# Patient Record
Sex: Female | Born: 1975 | Race: Black or African American | Hispanic: No | Marital: Single | Smoking: Never smoker
Health system: Southern US, Community
[De-identification: ages and names within clinical notes are randomized; demographics above are authoritative.]

## PROBLEM LIST (undated history)

## (undated) DIAGNOSIS — O3410 Maternal care for benign tumor of corpus uteri, unspecified trimester: Secondary | ICD-10-CM

## (undated) DIAGNOSIS — D259 Leiomyoma of uterus, unspecified: Secondary | ICD-10-CM

## (undated) DIAGNOSIS — I10 Essential (primary) hypertension: Secondary | ICD-10-CM

## (undated) DIAGNOSIS — T7840XA Allergy, unspecified, initial encounter: Secondary | ICD-10-CM

## (undated) DIAGNOSIS — M1990 Unspecified osteoarthritis, unspecified site: Secondary | ICD-10-CM

## (undated) DIAGNOSIS — K429 Umbilical hernia without obstruction or gangrene: Secondary | ICD-10-CM

## (undated) DIAGNOSIS — E785 Hyperlipidemia, unspecified: Secondary | ICD-10-CM

## (undated) HISTORY — DX: Allergy, unspecified, initial encounter: T78.40XA

## (undated) HISTORY — PX: UMBILICAL HERNIA REPAIR: SHX196

## (undated) HISTORY — DX: Morbid (severe) obesity due to excess calories: E66.01

## (undated) HISTORY — DX: Leiomyoma of uterus, unspecified: D25.9

## (undated) HISTORY — DX: Umbilical hernia without obstruction or gangrene: K42.9

## (undated) HISTORY — PX: HERNIA REPAIR: SHX51

## (undated) HISTORY — DX: Unspecified osteoarthritis, unspecified site: M19.90

## (undated) HISTORY — PX: WISDOM TOOTH EXTRACTION: SHX21

## (undated) HISTORY — DX: Hyperlipidemia, unspecified: E78.5

## (undated) HISTORY — DX: Leiomyoma of uterus, unspecified: O34.10

## (undated) HISTORY — DX: Essential (primary) hypertension: I10

---

## 2000-06-10 DIAGNOSIS — R87612 Low grade squamous intraepithelial lesion on cytologic smear of cervix (LGSIL): Secondary | ICD-10-CM | POA: Insufficient documentation

## 2000-06-10 HISTORY — DX: Low grade squamous intraepithelial lesion on cytologic smear of cervix (LGSIL): R87.612

## 2000-08-11 ENCOUNTER — Other Ambulatory Visit: Admission: RE | Admit: 2000-08-11 | Discharge: 2000-08-11 | Payer: Self-pay | Admitting: Obstetrics and Gynecology

## 2002-05-13 DIAGNOSIS — R87619 Unspecified abnormal cytological findings in specimens from cervix uteri: Secondary | ICD-10-CM | POA: Insufficient documentation

## 2002-06-07 ENCOUNTER — Other Ambulatory Visit: Admission: RE | Admit: 2002-06-07 | Discharge: 2002-06-07 | Payer: Self-pay | Admitting: Obstetrics and Gynecology

## 2002-12-03 ENCOUNTER — Other Ambulatory Visit: Admission: RE | Admit: 2002-12-03 | Discharge: 2002-12-03 | Payer: Self-pay | Admitting: Obstetrics and Gynecology

## 2004-11-26 ENCOUNTER — Other Ambulatory Visit: Admission: RE | Admit: 2004-11-26 | Discharge: 2004-11-26 | Payer: Self-pay | Admitting: Obstetrics and Gynecology

## 2012-02-27 ENCOUNTER — Ambulatory Visit (INDEPENDENT_AMBULATORY_CARE_PROVIDER_SITE_OTHER): Payer: BC Managed Care – PPO | Admitting: Family Medicine

## 2012-02-27 ENCOUNTER — Ambulatory Visit: Payer: BC Managed Care – PPO

## 2012-02-27 VITALS — BP 135/107 | HR 94 | Temp 98.0°F | Resp 18 | Ht 65.5 in | Wt 322.2 lb

## 2012-02-27 DIAGNOSIS — I10 Essential (primary) hypertension: Secondary | ICD-10-CM

## 2012-02-27 DIAGNOSIS — S76019A Strain of muscle, fascia and tendon of unspecified hip, initial encounter: Secondary | ICD-10-CM

## 2012-02-27 DIAGNOSIS — IMO0002 Reserved for concepts with insufficient information to code with codable children: Secondary | ICD-10-CM

## 2012-02-27 DIAGNOSIS — M25551 Pain in right hip: Secondary | ICD-10-CM

## 2012-02-27 DIAGNOSIS — M25559 Pain in unspecified hip: Secondary | ICD-10-CM

## 2012-02-27 MED ORDER — CYCLOBENZAPRINE HCL 5 MG PO TABS: 5.0000 mg | ORAL_TABLET | Freq: Three times a day (TID) | ORAL | Status: DC | PRN

## 2012-02-27 MED ORDER — HYDROCODONE-ACETAMINOPHEN 5-500 MG PO TABS: 1.0000 | ORAL_TABLET | Freq: Four times a day (QID) | ORAL | Status: DC | PRN

## 2012-02-27 NOTE — Patient Instructions (Addendum)
1. Pain in right hip  DG Hip Complete Right, DG Femur Right  2. Strain of hip flexor  cyclobenzaprine (FLEXERIL) 5 MG tablet, HYDROcodone-acetaminophen (VICODIN) 5-500 MG per tablet

## 2012-02-27 NOTE — Progress Notes (Signed)
[address]   [place], [place]  [address]   [phone]  Subjective:    Patient ID: Amanda Paul, female    DOB: 02-11-1976, 36 y.o.   MRN: [id number]  HPIThis 36 y.o. female presents for evaluation of RLE thigh pain.  At work and felt like pulled a muscle R upper thigh.  Extremely tight in R upper thigh. Pain with walking.  Radiates into leg.  Feels a bit swollen; all the pain in R upper lateral tight.  Minimal activity today.  Taking Ibuprofen 800mg  this morning.  No more medication today.  Trying to rest without improvement.  No injury or overuse at work; works at [employer]; no unusual activity.  The following day, barely able to get out of bed. Walking causes severe pain.  Having to shift weight to L side of body.  No associated back pain.  Had gone to [facility] but recommended evaluation to Urgent Care.  +muscle spasms in lower leg R.  No rash on leg.  Severity 7/10  2.  HTN:  Diagnosed by PCP recently; now checking BP with thigh cuff and running normal; not checking at home.     Review of Systems  Constitutional: Negative for fever, chills, diaphoresis and fatigue.  Respiratory: Negative for chest tightness and shortness of breath.   Cardiovascular: Negative for chest pain, palpitations and leg swelling.  Genitourinary: Negative for decreased urine volume and difficulty urinating.  Musculoskeletal: Positive for myalgias and gait problem. Negative for back pain and arthralgias.  Neurological: Negative for weakness and numbness.       Objective:   Physical Exam  Nursing note and vitals reviewed. Constitutional: She is oriented to person, place, and time. She appears well-developed and well-nourished.  HENT:  Head: Normocephalic and atraumatic.  Eyes: Conjunctivae normal and EOM are normal. Pupils are equal, round, and reactive to light.  Neck: Normal range of motion. Neck supple. No JVD present. No thyromegaly present.  Cardiovascular: Normal rate, regular rhythm and  normal heart sounds.   No murmur heard.      HOMMEN'S NEGATIVE BLE.  Pulmonary/Chest: Effort normal and breath sounds normal.  Musculoskeletal:       Right hip: She exhibits normal range of motion, normal strength, no tenderness, no bony tenderness and no swelling.       Lumbar back: Normal. She exhibits normal range of motion, no tenderness, no bony tenderness, no pain and no spasm.       Right upper leg: She exhibits tenderness. She exhibits no bony tenderness, no swelling, no edema, no deformity and no laceration.       LUMBAR SPINE: FULL ROM; NON-TENDER MIDLINE; NO PARASPINAL TTP; STRAIGHT LEG RAISES NEGATIVE. R HIP: NON-TENDER; +PAIN WITH ROM.  MOTOR 5/5.  PAIN WITH FLEXION OF R THIGH.  INTERNAL AND EXTERNAL ROTATION OF HIP WITH PAIN.  Lymphadenopathy:    She has no cervical adenopathy.  Neurological: She is alert and oriented to person, place, and time.  Skin: Skin is warm and dry. No rash noted. No erythema.  Psychiatric: She has a normal mood and affect. Her behavior is normal.      [facility] reading (PRIMARY) by  Dr. Katrinka Blazing.  R hip: NAD; R femur:  NAD   Assessment & Plan:   1. Pain in right hip  DG Hip Complete Right, DG Femur Right  2. Strain of hip flexor  cyclobenzaprine (FLEXERIL) 5 MG tablet, HYDROcodone-acetaminophen (VICODIN) 5-500 MG per tablet    1.  Pain/Strain R  hip flexor:  New.  Rx for Flexeril, hydrocodone. Recommend Ibuprofen 800mg  tid scheduled.  Frequent ambulation; heat to area.  Home exercise program provided to perform daily. Avoid repetitive squatting, bending, twisting, lifting, prolonged ambulation.  If no improvement in 1-2 weeks, call for ortho referral and PT.  Meds ordered this encounter  Medications  . hydrochlorothiazide (MICROZIDE) 12.5 MG capsule    Sig: Take 12.5 mg by mouth 2 (two) times daily.  Marland Kitchen amLODipine (NORVASC) 5 MG tablet    Sig: Take 5 mg by mouth daily.  . NON FORMULARY    Sig: Hair skin and nail vitamin  . cyclobenzaprine (FLEXERIL) 5  MG tablet    Sig: Take 1 tablet (5 mg total) by mouth 3 (three) times daily as needed for muscle spasms.    Dispense:  30 tablet    Refill:  0  . HYDROcodone-acetaminophen (VICODIN) 5-500 MG per tablet    Sig: Take 1 tablet by mouth every 6 (six) hours as needed for pain.    Dispense:  20 tablet    Refill:  0

## 2012-04-11 ENCOUNTER — Encounter (HOSPITAL_COMMUNITY): Payer: Self-pay | Admitting: *Deleted

## 2012-04-11 ENCOUNTER — Emergency Department (HOSPITAL_COMMUNITY)
Admission: EM | Admit: 2012-04-11 | Discharge: 2012-04-12 | Disposition: A | Discharge status: Home / Self Care | Payer: BC Managed Care – PPO | Attending: Emergency Medicine | Admitting: Emergency Medicine

## 2012-04-11 ENCOUNTER — Ambulatory Visit (INDEPENDENT_AMBULATORY_CARE_PROVIDER_SITE_OTHER): Payer: BC Managed Care – PPO | Admitting: Family Medicine

## 2012-04-11 VITALS — BP 122/84 | HR 85 | Temp 97.4°F | Resp 16 | Ht 66.0 in | Wt 315.0 lb

## 2012-04-11 DIAGNOSIS — I10 Essential (primary) hypertension: Secondary | ICD-10-CM | POA: Insufficient documentation

## 2012-04-11 DIAGNOSIS — R198 Other specified symptoms and signs involving the digestive system and abdomen: Secondary | ICD-10-CM

## 2012-04-11 DIAGNOSIS — K429 Umbilical hernia without obstruction or gangrene: Secondary | ICD-10-CM | POA: Insufficient documentation

## 2012-04-11 DIAGNOSIS — R1033 Periumbilical pain: Secondary | ICD-10-CM

## 2012-04-11 DIAGNOSIS — M129 Arthropathy, unspecified: Secondary | ICD-10-CM | POA: Insufficient documentation

## 2012-04-11 DIAGNOSIS — R109 Unspecified abdominal pain: Secondary | ICD-10-CM

## 2012-04-11 DIAGNOSIS — Z79899 Other long term (current) drug therapy: Secondary | ICD-10-CM | POA: Insufficient documentation

## 2012-04-11 LAB — POCT CBC
Granulocyte percent: 74.9 %G (ref 37–80)
MCV: 86 fL (ref 80–97)
MID (cbc): 0.4 (ref 0–0.9)
POC LYMPH PERCENT: 19.7 %L (ref 10–50)
Platelet Count, POC: 398 10*3/uL (ref 142–424)
RDW, POC: 16.1 %

## 2012-04-11 LAB — POCT UA - MICROSCOPIC ONLY
Casts, Ur, LPF, POC: NEGATIVE
Mucus, UA: POSITIVE
Yeast, UA: NEGATIVE

## 2012-04-11 LAB — POCT URINALYSIS DIPSTICK
Blood, UA: NEGATIVE
Nitrite, UA: NEGATIVE
Spec Grav, UA: 1.02
Urobilinogen, UA: 0.2
pH, UA: 6.5

## 2012-04-11 MED ORDER — ONDANSETRON 4 MG PO TBDP
8.0000 mg | ORAL_TABLET | Freq: Once | ORAL | Status: AC
  Administered 2012-04-11: 8 mg via ORAL

## 2012-04-11 MED ORDER — HYDROCODONE-ACETAMINOPHEN 7.5-500 MG/15ML PO SOLN
10.0000 mL | Freq: Once | ORAL | Status: AC
  Administered 2012-04-11: 10 mL via ORAL

## 2012-04-11 MED ORDER — KETOROLAC TROMETHAMINE 30 MG/ML IJ SOLN: 30.0000 mg | Freq: Once | INTRAMUSCULAR | Status: DC

## 2012-04-11 NOTE — ED Notes (Signed)
Up to desk asking if she had missed being called. We explained that she had been called. She explained that she had gone outside. Alert, NAD, calm, interactive.

## 2012-04-11 NOTE — ED Notes (Signed)
Pt alert, NAD, calm, interactive, sitting on BS in room, pending lab results. EDPA made aware of labs resulted previously at [facility].

## 2012-04-11 NOTE — ED Notes (Addendum)
Here from [facility], here for abd pain, sent for possible [place], pain onset 1445, no aggravating or aleviating factors noted prior to [facility], given med at [facility] which has helped resolve pain, denies pain at this time, (denies: back pain, urinary or vaginal sx, nvd, fever, bleeding or other sx), pinpoints previous pain to below umbilicus and describes as sharp and cramping. LMP 12/826, last ate peanut M&Ms 10 minutes ago, last BM today (loose). Ate pizza last night and thought stomach upset this am was r/t the pizza, pain developed around 1445. Ibuprofen taken at 1500. POC CBC, UA and Upreg from [facility] reviewed.

## 2012-04-11 NOTE — Progress Notes (Signed)
Subjective:    Patient ID: Amanda Paul, female    DOB: [birth year], 36 y.o.   MRN: [id number]  HPI  Was at work and began to have sharp pains in central lower abdomen - just came out of no where. At first she though it was cramps but to sharp.  Feels nausea but no vomiting. No f/c.  Nml urination.  Did have pizza aobut 9 pm last night so upset stomach this a.m. And had a few episodes of diarrhea but resolved after drinking gingerale.  No abdominal surgeries at all. No vag discharge, no stds. No h/o any abd or pelvic problems at all.  Periods completely normal. No h/o hernias.  In the exam room when I was examining her, pt noted that her belly button felt further than normal and most of her pain seems to be coming from her umbilicus, she attributes her epigastric tenderness to it pulling on her umbilicus. She can feel her umbilicus pain and pulling with walking, car ride is painful, painful when she changes positions.  Past Medical History  Diagnosis Date  . Allergy   . Arthritis   . Hypertension    Past Surgical History  Procedure Date  . Wisdom tooth extraction    Current Outpatient Prescriptions on File Prior to Visit  Medication Sig Dispense Refill  . amLODipine (NORVASC) 5 MG tablet Take 5 mg by mouth daily.      . hydrochlorothiazide (MICROZIDE) 12.5 MG capsule Take 12.5 mg by mouth 2 (two) times daily.      . NON FORMULARY Hair skin and nail vitamin      . cyclobenzaprine (FLEXERIL) 5 MG tablet Take 1 tablet (5 mg total) by mouth 3 (three) times daily as needed for muscle spasms.  30 tablet  0  . HYDROcodone-acetaminophen (VICODIN) 5-500 MG per tablet Take 1 tablet by mouth every 6 (six) hours as needed for pain.  20 tablet  0   Allergies  Allergen Reactions  . Penicillins Hives and Swelling    Review of Systems  Constitutional: Positive for appetite change. Negative for fever, chills, activity change, fatigue and unexpected weight change.  Respiratory: Negative for  shortness of breath.   Cardiovascular: Negative for chest pain and leg swelling.  Gastrointestinal: Positive for nausea, abdominal pain and diarrhea. Negative for vomiting, constipation, blood in stool, abdominal distention and anal bleeding.  Genitourinary: Negative for dysuria, frequency, hematuria, flank pain, decreased urine volume, vaginal discharge, difficulty urinating, vaginal pain, menstrual problem and pelvic pain.  Musculoskeletal: Negative for myalgias, arthralgias and gait problem.  Skin: Negative for rash.  Hematological: Negative for adenopathy.      BP 122/84  Pulse 85  Temp 97.4 F (36.3 C) (Oral)  Resp 16  Ht 5\' 6"  (1.676 m)  Wt 315 lb (142.883 kg)  BMI 50.84 kg/m2  SpO2 100%  LMP 04/03/2012 Objective:   Physical Exam  Constitutional: She is oriented to person, place, and time. She appears well-developed and well-nourished. No distress.  HENT:  Head: Normocephalic and atraumatic.  Right Ear: External ear normal.  Left Ear: External ear normal.  Eyes: Conjunctivae normal are normal. No scleral icterus.  Neck: Normal range of motion. Neck supple. No thyromegaly present.  Cardiovascular: Normal rate, regular rhythm, normal heart sounds and intact distal pulses.   Pulmonary/Chest: Effort normal and breath sounds normal. No respiratory distress.  Abdominal: Soft. Normal appearance. She exhibits mass. She exhibits no distension. Bowel sounds are absent. There is tenderness in the epigastric area and  periumbilical area. There is guarding. There is no rigidity, no rebound, no CVA tenderness, no tenderness at McBurney's point and negative Murphy's sign.       Examin limited by abdominal obesity but severe tenderness at umbilicus which feels soft and full, no warmth or erythema.  Musculoskeletal: She exhibits no edema.  Lymphadenopathy:    She has no cervical adenopathy.  Neurological: She is alert and oriented to person, place, and time.  Skin: Skin is warm and dry. She  is not diaphoretic. No erythema.  Psychiatric: She has a normal mood and affect. Her behavior is normal.      Results for orders placed in visit on 04/11/12  POCT UA - MICROSCOPIC ONLY      Component Value Range   WBC, Ur, HPF, POC neg     RBC, urine, microscopic neg     Bacteria, U Microscopic 2+     Mucus, UA pos     Epithelial cells, urine per micros 0-1     Crystals, Ur, HPF, POC neg     Casts, Ur, LPF, POC neg     Yeast, UA neg    POCT URINALYSIS DIPSTICK      Component Value Range   Color, UA yellow     Clarity, UA clear     Glucose, UA neg     Bilirubin, UA neg     Ketones, UA neg     Spec Grav, UA 1.020     Blood, UA neg     pH, UA 6.5     Protein, UA neg     Urobilinogen, UA 0.2     Nitrite, UA neg     Leukocytes, UA Negative    POCT CBC      Component Value Range   WBC 7.2  4.6 - 10.2 K/uL   Lymph, poc 1.4  0.6 - 3.4   POC LYMPH PERCENT 19.7  10 - 50 %L   MID (cbc) 0.4  0 - 0.9   POC MID % 5.4  0 - 12 %M   POC Granulocyte 5.4  2 - 6.9   Granulocyte percent 74.9  37 - 80 %G   RBC 5.17  4.04 - 5.48 M/uL   Hemoglobin 13.3  12.2 - 16.2 g/dL   HCT, POC 96.0  45.4 - 47.9 %   MCV 86.0  80 - 97 fL   MCH, POC 25.7 (*) 27 - 31.2 pg   MCHC 29.9 (*) 31.8 - 35.4 g/dL   RDW, POC 09.8     Platelet Count, POC 398  142 - 424 K/uL   MPV 7.5  0 - 99.8 fL    Assessment & Plan:  Acute periumbilical abdominal pain - To ER by private vehicle - this pt needs imaging.  A friend will drive her and we will alert [facility].   I am concerned she could have an incarcerated hernia though fortunately she has not developed peritoneal signs yet. She IS having peritoneal signs. Give lortab elixir 5mg  po x 1 now and zofran 8mg  po sl x 1 now.  CBC and UA pending.

## 2012-04-12 ENCOUNTER — Encounter (HOSPITAL_COMMUNITY): Payer: Self-pay | Admitting: Radiology

## 2012-04-12 ENCOUNTER — Emergency Department (HOSPITAL_COMMUNITY): Payer: BC Managed Care – PPO

## 2012-04-12 LAB — COMPREHENSIVE METABOLIC PANEL WITH GFR
ALT: 22 U/L (ref 0–35)
AST: 24 U/L (ref 0–37)
CO2: 27 meq/L (ref 19–32)
Calcium: 9.8 mg/dL (ref 8.4–10.5)
Chloride: 97 meq/L (ref 96–112)
GFR calc Af Amer: >90 mL/min (ref >90)
GFR calc non Af Amer: >90 mL/min (ref >90)
Glucose, Bld: 94 mg/dL (ref 70–99)
Sodium: 137 meq/L (ref 135–145)
Total Bilirubin: 0.3 mg/dL (ref 0.3–1.2)

## 2012-04-12 LAB — COMPREHENSIVE METABOLIC PANEL
Albumin: 3.8 g/dL (ref 3.5–5.2)
Alkaline Phosphatase: 52 U/L (ref 39–117)
BUN: 8 mg/dL (ref 6–23)
Creatinine, Ser: 0.51 mg/dL (ref 0.50–1.10)
Potassium: 3.2 mEq/L — ABNORMAL LOW (ref 3.5–5.1)
Total Protein: 8.2 g/dL (ref 6.0–8.3)

## 2012-04-12 MED ORDER — MORPHINE SULFATE 4 MG/ML IJ SOLN
6.0000 mg | Freq: Once | INTRAMUSCULAR | Status: AC
  Administered 2012-04-12: 6 mg via INTRAVENOUS
  Filled 2012-04-12: qty 2

## 2012-04-12 MED ORDER — ONDANSETRON HCL 4 MG/2ML IJ SOLN
4.0000 mg | Freq: Once | INTRAMUSCULAR | Status: AC
  Administered 2012-04-12: 4 mg via INTRAVENOUS
  Filled 2012-04-12: qty 2

## 2012-04-12 MED ORDER — IOHEXOL 300 MG/ML  SOLN
100.0000 mL | Freq: Once | INTRAMUSCULAR | Status: AC | PRN
  Administered 2012-04-12: 100 mL via INTRAVENOUS

## 2012-04-12 NOTE — ED Notes (Signed)
CT contacted, bringing PO contrast, pt updated about wait. Alert, NAD, calm, interactive.

## 2012-04-12 NOTE — ED Notes (Signed)
Sitting in chair, leaning against stretcher, sleeping, easily arousable, NAD, calm. IV infusing KVO, pending results &/or disposition.

## 2012-04-12 NOTE — ED Notes (Addendum)
No changes, "feels better", denies pain or nausea, calling for ride, denies questions, needs sx or concerns unmet. VSS. Declined w/c, alert, NAD, calm.

## 2012-04-12 NOTE — ED Provider Notes (Signed)
History     CSN: [id number]  Arrival date & time 04/11/12  1844   First MD Initiated Contact with Patient 04/11/12 2332      Chief Complaint  Patient presents with  . Abdominal Pain    (Consider location/radiation/quality/duration/timing/severity/associated sxs/prior treatment) HPI  Amanda Paul is a 37 y.o. female [facility] to rule out strangely the umbilical hernia. Patient develops severe pain to the area several hours ago. She is reporting that there is a lump in the umbilicus that was not there previously. She has had normal bowel movements and is passing flatus. Denies fever, nausea vomiting. Prior similar episodes  Past Medical History  Diagnosis Date  . Allergy   . Arthritis   . Hypertension     Past Surgical History  Procedure Date  . Wisdom tooth extraction     Family History  Problem Relation Age of Onset  . Hypertension Mother     History  Substance Use Topics  . Smoking status: Never Smoker   . Smokeless tobacco: Not on file  . Alcohol Use: 0.6 oz/week    1 Cans of beer per week    OB History    Grav Para Term Preterm Abortions TAB SAB Ect Mult Living                  Review of Systems  Constitutional: Negative for fever.  Respiratory: Negative for shortness of breath.   Cardiovascular: Negative for chest pain.  Gastrointestinal: Positive for abdominal pain. Negative for nausea, vomiting and diarrhea.  All other systems reviewed and are negative.    Allergies  Penicillins  Home Medications   Current Outpatient Rx  Name  Route  Sig  Dispense  Refill  . AMLODIPINE BESYLATE 5 MG PO TABS   Oral   Take 5 mg by mouth daily.         . CYCLOBENZAPRINE HCL 5 MG PO TABS   Oral   Take 1 tablet (5 mg total) by mouth 3 (three) times daily as needed for muscle spasms.   30 tablet   0   . HYDROCHLOROTHIAZIDE 12.5 MG PO CAPS   Oral   Take 12.5 mg by mouth 2 (two) times daily.         Marland Kitchen HYDROCODONE-ACETAMINOPHEN  5-500 MG PO TABS   Oral   Take 1 tablet by mouth every 6 (six) hours as needed for pain.   20 tablet   0   . NON FORMULARY      Hair skin and nail vitamin           BP 150/93  Pulse 85  Temp 98.1 F (36.7 C) (Oral)  Resp 18  SpO2 100%  LMP 04/03/2012  Physical Exam  Nursing note and vitals reviewed. Constitutional: She is oriented to person, place, and time. She appears well-developed and well-nourished. No distress.  HENT:  Head: Normocephalic.  Eyes: Conjunctivae normal and EOM are normal.  Cardiovascular: Normal rate.   Pulmonary/Chest: Effort normal. No stridor.  Abdominal: Soft. Bowel sounds are normal. She exhibits no distension and no mass. There is tenderness. There is no rebound and no guarding.       Bowel sounds normal, patient has point tenderness on 1 cm protrusion in the umbilicus.  Musculoskeletal: Normal range of motion.  Neurological: She is alert and oriented to person, place, and time.  Psychiatric: She has a normal mood and affect.    ED Course  Procedures (including critical care  time)  Labs Reviewed  COMPREHENSIVE METABOLIC PANEL - Abnormal; Notable for the following:    Potassium 3.2 (*)     All other components within normal limits  LIPASE, BLOOD   Ct Abdomen Pelvis W Contrast  04/12/2012  *RADIOLOGY REPORT*  Clinical Data: Abdominal pain.  CT ABDOMEN AND PELVIS WITH CONTRAST  Technique:  Multidetector CT imaging of the abdomen and pelvis was performed following the standard protocol during bolus administration of intravenous contrast.  Contrast: OMNIPAQUE IOHEXOL 300 MG/ML  SOLN  Comparison: Pelvic radiograph performed 02/27/2012  Findings: The visualized lung bases are clear.  Vague hypoattenuation within the liver is thought to reflect normal vasculature; the liver is grossly unremarkable in appearance.  The gallbladder is within normal limits.  The pancreas and adrenal glands are unremarkable.  The kidneys are unremarkable in appearance.   There is no evidence of hydronephrosis.  No renal or ureteral stones are seen.  No perinephric stranding is appreciated.  There is herniation of a short segment of likely mid ileum into a small to moderate umbilical hernia, with a small amount of associated fluid and mild soft tissue inflammation along the adjacent mesentery.  There is no evidence for bowel obstruction. The small bowel is otherwise unremarkable in appearance.  The stomach is within normal limits.  No acute vascular abnormalities are seen.  The appendix is normal in caliber, without evidence for appendicitis.  Scattered small pericecal nodes likely remain within normal limits.  Scattered diverticulosis is noted along the transverse, descending and proximal sigmoid colon.  The colon is otherwise unremarkable in appearance.  The bladder is decompressed and grossly unremarkable in appearance. Enlargement of the uterus is thought to reflect underlying fibroids.  The ovaries are not well assessed; no suspicious adnexal masses are seen.  No inguinal lymphadenopathy is seen.  No acute osseous abnormalities are identified.  Facet disease is noted along the lumbar spine.  IMPRESSION:  1.  Herniation of a short segment of likely mid ileum into a small to moderate umbilical hernia, with a small amount of associated fluid and mild soft tissue inflammation.  No evidence for bowel obstruction. 2.  Enlargement of the uterus is thought to reflect underlying fibroids.  3.  Scattered diverticulosis along the transverse, descending and proximal sigmoid colon, without evidence of diverticulitis.   Original Report Authenticated By: Tonia Ghent, M.D.      1. Umbilical hernia       MDM  Patient had a CBC at urgent care which showed no leukocytosis. Also urine pregnancy was negative. Patient will be given pain control and CT abdomen pelvis ordered.  CT shows herniation of ileum into a moderately sized in the local hernia there is mild inflammation with no  evidence of ischemia or obstruction.  Hernia was manually reduced. Patient was given return precautions and I advised her to attempt reduction herself if it recurs. Surgical followup given.   Pt verbalized understanding and agrees with care plan. Outpatient follow-up and return precautions given.       Wynetta Emery, PA-C 04/13/12 0234

## 2012-04-12 NOTE — ED Notes (Signed)
Confirmed lipase added on, CMET "running on machine".

## 2012-04-12 NOTE — ED Notes (Signed)
Pain and nause worse since PO contrast. meds given. No changes, alert, NAD, calm, interactive, sitting in chair for comfort.

## 2012-04-13 NOTE — ED Provider Notes (Signed)
Medical screening examination/treatment/procedure(s) were performed by non-physician practitioner and as supervising physician I was immediately available for consultation/collaboration.   Joya Gaskins, MD 04/13/12 [phone]

## 2012-04-14 ENCOUNTER — Encounter (INDEPENDENT_AMBULATORY_CARE_PROVIDER_SITE_OTHER): Payer: Self-pay | Admitting: Surgery

## 2012-04-14 ENCOUNTER — Ambulatory Visit (INDEPENDENT_AMBULATORY_CARE_PROVIDER_SITE_OTHER): Payer: BC Managed Care – PPO | Admitting: Surgery

## 2012-04-14 VITALS — BP 140/82 | HR 84 | Temp 97.4°F | Resp 18 | Ht 66.0 in | Wt 317.0 lb

## 2012-04-14 DIAGNOSIS — K429 Umbilical hernia without obstruction or gangrene: Secondary | ICD-10-CM | POA: Insufficient documentation

## 2012-04-14 HISTORY — DX: Umbilical hernia without obstruction or gangrene: K42.9

## 2012-04-14 NOTE — Progress Notes (Signed)
Patient ID: Ailene Royal, female   DOB: 02-18-1976, 37 y.o.   MRN: [id number]  Chief Complaint  Patient presents with  . Pre-op Exam    eval umb hernia    HPI Robbie Nangle is a 37 y.o. female.  Referred by Dr. Lelon Perla for evaluation of umbilical hernia HPI This is a 37 year old female who recently presented to the emergency department with severe periumbilical pain. She was noted to have an incarcerated umbilical hernia. CT scan showed a small segment of small bowel that was incarcerated. There were able to reduce this in the emergency department. She has been much less tender over the last couple of days. She presents now for surgical evaluation. She has been able to have bowel movements. She has not really been eating much because she is afraid that this would start hurting again. She has lost about 65 pounds over last year and never noticed a lump in her umbilicus before the emergency department visit.  Past Medical History  Diagnosis Date  . Allergy   . Arthritis   . Hypertension     Past Surgical History  Procedure Date  . Wisdom tooth extraction   . Broken ankle 37 y.o.    Right.    Family History  Problem Relation Age of Onset  . Hypertension Mother     Social History History  Substance Use Topics  . Smoking status: Never Smoker   . Smokeless tobacco: Not on file  . Alcohol Use: 0.6 oz/week    1 Cans of beer per week    Allergies  Allergen Reactions  . Amoxicillin Hives and Itching    Swollen eyes and throat.  Marland Kitchen Penicillins Hives and Swelling    Current Outpatient Prescriptions  Medication Sig Dispense Refill  . amLODipine (NORVASC) 5 MG tablet Take 5 mg by mouth daily.      . cyclobenzaprine (FLEXERIL) 5 MG tablet Take 1 tablet (5 mg total) by mouth 3 (three) times daily as needed for muscle spasms.  30 tablet  0  . hydrochlorothiazide (MICROZIDE) 12.5 MG capsule Take 12.5 mg by mouth 2 (two) times daily.      Marland Kitchen HYDROcodone-acetaminophen (VICODIN)  5-500 MG per tablet Take 1 tablet by mouth every 6 (six) hours as needed for pain.  20 tablet  0  . NON FORMULARY Hair skin and nail vitamin        Review of Systems Review of Systems  Constitutional: Negative for fever, chills and unexpected weight change.  HENT: Negative for hearing loss, congestion, sore throat, trouble swallowing and voice change.   Eyes: Negative for visual disturbance.  Respiratory: Negative for cough and wheezing.   Cardiovascular: Negative for chest pain, palpitations and leg swelling.  Gastrointestinal: Positive for nausea and abdominal pain. Negative for vomiting, diarrhea, constipation, blood in stool, abdominal distention and anal bleeding.  Genitourinary: Negative for hematuria, vaginal bleeding and difficulty urinating.  Musculoskeletal: Negative for arthralgias.  Skin: Negative for rash and wound.  Neurological: Negative for seizures, syncope and headaches.  Hematological: Negative for adenopathy. Does not bruise/bleed easily.  Psychiatric/Behavioral: Negative for confusion.    Blood pressure 140/82, pulse 84, temperature 97.4 F (36.3 C), temperature source Temporal, resp. rate 18, height 5\' 6"  (1.676 m), weight 317 lb (143.79 kg), last menstrual period 04/03/2012.  Physical Exam Physical Exam WDWN in NAD HEENT:  EOMI, sclera anicteric Neck:  No masses, no thyromegaly Lungs:  CTA bilaterally; normal respiratory effort CV:  Regular rate and rhythm; no murmurs Abd:  +  bowel sounds, morbidly obese; palpable mass in upper umbilicus - enlarges with Valsalva; partially reducible Ext:  Well-perfused; no edema Skin:  Warm, dry; no sign of jaundice  Data Reviewed CT abd/ pelvis Clinical Data: Abdominal pain.  CT ABDOMEN AND PELVIS WITH CONTRAST  Technique: Multidetector CT imaging of the abdomen and pelvis was  performed following the standard protocol during bolus  administration of intravenous contrast.  Contrast: OMNIPAQUE IOHEXOL 300 MG/ML  SOLN  Comparison: Pelvic radiograph performed 02/27/2012  Findings: The visualized lung bases are clear.  Vague hypoattenuation within the liver is thought to reflect normal  vasculature; the liver is grossly unremarkable in appearance. The  gallbladder is within normal limits. The pancreas and adrenal  glands are unremarkable.  The kidneys are unremarkable in appearance. There is no evidence  of hydronephrosis. No renal or ureteral stones are seen. No  perinephric stranding is appreciated.  There is herniation of a short segment of likely mid ileum into a  small to moderate umbilical hernia, with a small amount of  associated fluid and mild soft tissue inflammation along the  adjacent mesentery. There is no evidence for bowel obstruction.  The small bowel is otherwise unremarkable in appearance. The  stomach is within normal limits. No acute vascular abnormalities  are seen.  The appendix is normal in caliber, without evidence for  appendicitis. Scattered small pericecal nodes likely remain within  normal limits. Scattered diverticulosis is noted along the  transverse, descending and proximal sigmoid colon. The colon is  otherwise unremarkable in appearance.  The bladder is decompressed and grossly unremarkable in appearance.  Enlargement of the uterus is thought to reflect underlying  fibroids. The ovaries are not well assessed; no suspicious adnexal  masses are seen. No inguinal lymphadenopathy is seen.  No acute osseous abnormalities are identified. Facet disease is  noted along the lumbar spine.  IMPRESSION:  1. Herniation of a short segment of likely mid ileum into a small  to moderate umbilical hernia, with a small amount of associated  fluid and mild soft tissue inflammation. No evidence for bowel  obstruction.  2. Enlargement of the uterus is thought to reflect underlying  fibroids.  3. Scattered diverticulosis along the transverse, descending and  proximal sigmoid colon,  without evidence of diverticulitis.  Original Report Authenticated By: Tonia Ghent, M.D.    Assessment    Incarcerated umbilical hernia - now reduced    Plan    Umbilical hernia repair with mesh. The surgical procedure has been discussed with the patient.  Potential risks, benefits, alternative treatments, and expected outcomes have been explained.  All of the patient's questions at this time have been answered.  The likelihood of reaching the patient's treatment goal is good.  The patient understand the proposed surgical procedure and wishes to proceed.        Jacey Eckerson K. 04/14/2012, 9:47 AM

## 2012-04-25 DIAGNOSIS — K429 Umbilical hernia without obstruction or gangrene: Secondary | ICD-10-CM

## 2012-04-26 ENCOUNTER — Telehealth (INDEPENDENT_AMBULATORY_CARE_PROVIDER_SITE_OTHER): Payer: Self-pay | Admitting: General Surgery

## 2012-04-26 NOTE — Telephone Encounter (Signed)
Spoke with patient  1st po is  05/19/12 @9 :10 Am  Patient states she is doing fine.

## 2012-05-03 ENCOUNTER — Telehealth (INDEPENDENT_AMBULATORY_CARE_PROVIDER_SITE_OTHER): Payer: Self-pay | Admitting: General Surgery

## 2012-05-03 NOTE — Telephone Encounter (Signed)
Pt called to ask some general post op questions.  The steri-strips are very loose, but still hanging on; instructed OK to remove them now.  Also, she reports serous fluid is draining from the site.  Reassured her that this is not unexpected and due to tissue swelling.  Use guaze or bandaid to absorb this.  Can use ice pack to the site for the swelling.  This will resolve over time.  She understands all.

## 2012-05-10 ENCOUNTER — Ambulatory Visit: Payer: BC Managed Care – PPO | Admitting: Obstetrics and Gynecology

## 2012-05-10 ENCOUNTER — Encounter: Payer: Self-pay | Admitting: Obstetrics and Gynecology

## 2012-05-10 VITALS — BP 134/60 | Ht 66.0 in | Wt 314.0 lb

## 2012-05-10 DIAGNOSIS — K429 Umbilical hernia without obstruction or gangrene: Secondary | ICD-10-CM | POA: Insufficient documentation

## 2012-05-10 DIAGNOSIS — I10 Essential (primary) hypertension: Secondary | ICD-10-CM

## 2012-05-10 DIAGNOSIS — Z01419 Encounter for gynecological examination (general) (routine) without abnormal findings: Secondary | ICD-10-CM

## 2012-05-10 DIAGNOSIS — Z124 Encounter for screening for malignant neoplasm of cervix: Secondary | ICD-10-CM

## 2012-05-10 DIAGNOSIS — D219 Benign neoplasm of connective and other soft tissue, unspecified: Secondary | ICD-10-CM

## 2012-05-10 NOTE — Progress Notes (Signed)
The patient reports:  Contraception:None  Last mammogram: per pt 2011 Normal Last pap:11/26/2004 Normal  GC/Chlamydia cultures offered: declined HIV/RPR/HbsAg offered:  declined HSV 1 and 2 glycoprotein offered: declined  Menstrual cycle regular and monthly: Yes every 28 days Menstrual flow normal: first day spotting then heavy lasts 5-6 days   Urinary symptoms: none Normal bowel movements: No  Reports abuse at home: No:    Subjective:    Amanda Paul is a 37 y.o. female, G0P0000, who presents for an annual exam.     History   Social History  . Marital Status: Unknown    Spouse Name: N/A    Number of Children: N/A  . Years of Education: N/A   Social History Main Topics  . Smoking status: Never Smoker   . Smokeless tobacco: Never Used  . Alcohol Use: 0.6 oz/week    1 Cans of beer per week  . Drug Use: No  . Sexually Active: Not Currently   Other Topics Concern  . Not on file   Social History Narrative   Marital status: single   Children: none   Lives: alone   Employment:  [employer]    Menstrual cycle:   LMP: Patient's last menstrual period was 03/29/2012.           Cycle: monthly, 5-6 days, 2 nd day is heavy requiring 1 tampon and 1 pad every 2-3 hours, no clots, no dysmenorrhea.  CBC in 03/3012 with normal Hgb  Pelvic CT-scan 03/2012 for umbilical hernia also noted uterine enlargement probably fibroids. Ovaries normal.  The following portions of the patient's history were reviewed and updated as appropriate: allergies, current medications, past family history, past medical history, past social history, past surgical history and problem list.  Review of Systems Pertinent items are noted in HPI. Breast:Negative for breast lump,nipple discharge or nipple retraction Gastrointestinal: Negative for abdominal pain, change in bowel habits or rectal bleeding Urinary:negative   Objective:    LMP 03/29/2012    Weight:  Wt Readings from Last 1  Encounters:  04/14/12 317 lb (143.79 kg)          BMI: There is no height or weight on file to calculate BMI.  General Appearance: Alert, appropriate appearance for age. No acute distress HEENT: Grossly normal Neck / Thyroid: Supple, no masses, nodes or enlargement Lungs: clear to auscultation bilaterally Back: No CVA tenderness Breast Exam: No masses or nodes.No dimpling, nipple retraction or discharge. Cardiovascular: Regular rate and rhythm. S1, S2, no murmur Gastrointestinal: Soft, non-tender, no masses or organomegaly Mild erythema at umbilical incision from 04/25/12 surgery Pelvic Exam: Vulva and vagina appear normal. Bimanual exam reveals normal uterus and adnexa.Limited by body habitus Rectovaginal: not indicated Lymphatic Exam: Non-palpable nodes in neck, clavicular, axillary, or inguinal regions Skin: no rash or abnormalities Neurologic: Normal gait and speech, no tremor  Psychiatric: Alert and oriented, appropriate affect.     Assessment:    Normal gyn exam  Asymptomatic fibroid   Plan:    pap smear return annually or prn STD screening: declined Contraception:no method Warm moist compresses ay umbilicus. If worsening, pt instructed to call surgeon.   Silverio Lay MD

## 2012-05-12 LAB — PAP IG W/ RFLX HPV ASCU

## 2012-05-14 ENCOUNTER — Ambulatory Visit (INDEPENDENT_AMBULATORY_CARE_PROVIDER_SITE_OTHER): Payer: BC Managed Care – PPO | Admitting: Family Medicine

## 2012-05-14 VITALS — BP 140/81 | HR 91 | Temp 98.4°F | Resp 18 | Ht 65.3 in | Wt 317.6 lb

## 2012-05-14 DIAGNOSIS — Z5189 Encounter for other specified aftercare: Secondary | ICD-10-CM

## 2012-05-14 DIAGNOSIS — K429 Umbilical hernia without obstruction or gangrene: Secondary | ICD-10-CM

## 2012-05-14 DIAGNOSIS — S31109A Unspecified open wound of abdominal wall, unspecified quadrant without penetration into peritoneal cavity, initial encounter: Secondary | ICD-10-CM

## 2012-05-14 NOTE — Progress Notes (Signed)
[facility]:  Office Visit  Chief Complaint:  Chief Complaint  Patient presents with  . had hernia surgery and now incision now draining    HPI: Amanda Paul is a 37 y.o. female who complains of here to check if drainage from her laparoscopic umbilical hernia repair 2 weeks ago is infected. She had it done on 1/14 by Dr. Margaree Mackintosh. Incision site has been draining yellow fluid since after surgery. She noticed that she can see the incision site and it is opening up. The drainage continues. She was at her OB/GYn and was told to put warm compresses on it. She denies fevers, chill, nausea, vomiting, abd pain. Has been using a rag to catch the drainage.  Past Medical History  Diagnosis Date  . Allergy   . Arthritis   . Hypertension   . Morbid obesity   . Hernia, umbilical    Past Surgical History  Procedure Date  . Wisdom tooth extraction   . Umbilical hernia repair   . Ankle fracture surgery    History   Social History  . Marital Status: Unknown    Spouse Name: N/A    Number of Children: N/A  . Years of Education: N/A   Social History Main Topics  . Smoking status: Never Smoker   . Smokeless tobacco: Never Used  . Alcohol Use: 0.6 oz/week    1 Cans of beer per week  . Drug Use: No  . Sexually Active: Not Currently   Other Topics Concern  . Not on file   Social History Narrative   Marital status: single   Children: none   Lives: alone   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   Family History  Problem Relation Age of Onset  . Hypertension Mother    Allergies  Allergen Reactions  . Amoxicillin Hives and Itching    Swollen eyes and throat.  Marland Kitchen Penicillins Hives and Swelling   Prior to Admission medications   Medication Sig Start Date End Date Taking? Authorizing Provider  amLODipine (NORVASC) 5 MG tablet Take 5 mg by mouth daily.   Yes Historical Provider, MD  cyclobenzaprine (FLEXERIL) 5 MG tablet Take 1 tablet (5 mg total) by mouth 3 (three)  times daily as needed for muscle spasms. 02/27/12  Yes Ethelda Chick, MD  ergocalciferol (VITAMIN D2) 50000 UNITS capsule Take 50,000 Units by mouth 2 (two) times a week. Tuesday and Friday   Yes Historical Provider, MD  hydrochlorothiazide (MICROZIDE) 12.5 MG capsule Take 12.5 mg by mouth 2 (two) times daily.   Yes Historical Provider, MD  HYDROcodone-acetaminophen (VICODIN) 5-500 MG per tablet Take 1 tablet by mouth every 6 (six) hours as needed for pain. 02/27/12  Yes Ethelda Chick, MD  oxyCODONE-acetaminophen (PERCOCET/ROXICET) 5-325 MG per tablet Take 1 tablet by mouth as needed.   Yes Historical Provider, MD  NON FORMULARY Hair skin and nail vitamin    Historical Provider, MD     ROS: The patient denies fevers, chills, night sweats, unintentional weight loss, chest pain, palpitations, wheezing, dyspnea on exertion, nausea, vomiting, abdominal pain, dysuria, hematuria, melena, numbness, weakness, or tingling.   All other systems have been reviewed and were otherwise negative with the exception of those mentioned in the HPI and as above.    PHYSICAL EXAM: Filed Vitals:   05/14/12 1330  BP: 140/81  Pulse: 91  Temp: 98.4 F (36.9 C)  Resp: 18   Filed Vitals:   05/14/12 1330  Height: 5'  5.3" (1.659 m)  Weight: 317 lb 9.6 oz (144.062 kg)   Body mass index is 52.37 kg/(m^2).  General: Alert, no acute distress, obese AA female HEENT:  Normocephalic, atraumatic, oropharynx patent.  Cardiovascular:  Regular rate and rhythm, no rubs murmurs or gallops.  No Carotid bruits, radial pulse intact. No pedal edema.  Respiratory: Clear to auscultation bilaterally.  No wheezes, rales, or rhonchi.  No cyanosis, no use of accessory musculature GI: No organomegaly, abdomen is soft and non-tender, positive bowel sounds.  No masses. There is a clump of glue that is stuck to the right side of her umbilical incision.  Skin: + clear yellow dc , no e/o cellulitis, incision looks clean, there is some  flaking of the glue that was used for approximating skin. No warmth, no abnormal redness. Nontender.  Psychiatric: Patient is appropriate throughout our interaction. Lymphatic: No cervical lymphadenopathy Musculoskeletal: Gait intact.   LABS: Results for orders placed in visit on 05/10/12  PAP IG W/ RFLX HPV ASCU      Component Value Range   Specimen adequacy:       FINAL DIAGNOSIS:   (*)    RECOMMENDATIONS:       Cytotechnologist:       QC Cytotechnologist:       Pathologist:      HUMAN PAPILLOMAVIRUS, HIGH RISK      Component Value Range   HPV DNA High Risk         EKG/XRAY:   Primary read interpreted by Dr. Conley Rolls at [facility].   ASSESSMENT/PLAN: Encounter Diagnosis  Name Primary?  Marland Kitchen Wound, open, abdominal wall, anterior Yes   No evidence of infection s/p umbilical hernia repair.  Monitor for s/sx infection: warmth, redness, pain, fevers, nausea, vomiting Gave Nonstick pads and gauze and gave patient instructions on how to use. Prefer her to use clean dressing rather than rag to absorb drainage. F/u with Dr. Margaree Mackintosh.as directed  LE, THAO PHUONG, DO 05/14/2012 2:26 PM

## 2012-05-15 NOTE — Progress Notes (Signed)
Quick Note:  Please send ASCUS letter. Pap in 1 year. ______ 

## 2012-05-16 ENCOUNTER — Encounter: Payer: Self-pay | Admitting: Obstetrics and Gynecology

## 2012-05-19 ENCOUNTER — Ambulatory Visit (INDEPENDENT_AMBULATORY_CARE_PROVIDER_SITE_OTHER): Payer: BC Managed Care – PPO | Admitting: Surgery

## 2012-05-19 ENCOUNTER — Encounter (INDEPENDENT_AMBULATORY_CARE_PROVIDER_SITE_OTHER): Payer: Self-pay | Admitting: Surgery

## 2012-05-19 VITALS — BP 124/86 | HR 72 | Temp 98.6°F | Resp 14 | Ht 66.0 in | Wt 310.4 lb

## 2012-05-19 DIAGNOSIS — K429 Umbilical hernia without obstruction or gangrene: Secondary | ICD-10-CM

## 2012-05-19 MED ORDER — DOXYCYCLINE HYCLATE 100 MG PO CAPS: 100.0000 mg | ORAL_CAPSULE | Freq: Two times a day (BID) | ORAL | Status: DC

## 2012-05-19 NOTE — Progress Notes (Signed)
Status post open repair of a 2.5 cm umbilical hernia with mesh on 04/25/12. The patient's soreness is gone away. She has been having drainage from her umbilicus for several days. She states that initially was coming from the incision itself but now seems to be coming from the umbilicus. She denies any fever. Appetite and bowel movements are normal.  Filed Vitals:   05/19/12 0916  BP: 124/86  Pulse: 72  Temp: 98.6 F (37 C)  Resp: 14    Her transverse incision seems to be approximated and closed. I do not see any drainage coming from the incision itself. Just some firm scar tissue around this incision but no sign of recurrent hernia. There does seem to be some clear drainage coming from deep within the umbilicus. I cannot visualize any skin defect but it is possible that some of the deep umbilical skin became ischemic and is draining some seroma fluid. I encouraged her to clean this area with hydrogen peroxide daily and Tupac dry gauze within the umbilicus. We will also prescribe a one-week course of doxycycline. Recheck in 2 weeks. Limit her activity and heavy lifting.   Wilmon Arms. Corliss Skains, MD, [facility]  05/19/2012 12:51 PM

## 2012-05-22 NOTE — Progress Notes (Signed)
Reviewed and agree.

## 2012-06-01 ENCOUNTER — Ambulatory Visit: Payer: BC Managed Care – PPO | Admitting: Obstetrics and Gynecology

## 2012-06-01 ENCOUNTER — Ambulatory Visit: Payer: BC Managed Care – PPO

## 2012-06-01 ENCOUNTER — Other Ambulatory Visit: Payer: Self-pay | Admitting: Obstetrics and Gynecology

## 2012-06-01 ENCOUNTER — Encounter: Payer: Self-pay | Admitting: Obstetrics and Gynecology

## 2012-06-01 VITALS — BP 114/70 | Ht 66.0 in | Wt 309.0 lb

## 2012-06-01 DIAGNOSIS — D219 Benign neoplasm of connective and other soft tissue, unspecified: Secondary | ICD-10-CM

## 2012-06-01 MED ORDER — IBUPROFEN 600 MG PO TABS: 600.0000 mg | ORAL_TABLET | Freq: Four times a day (QID) | ORAL | Status: DC | PRN

## 2012-06-01 NOTE — Progress Notes (Signed)
Subjective:    Amanda Paul is a 37 y.o. female, G0P0000, who presents for Gyn ultrasound because of evaluation of fibroids. Report: cycle monthly lasting 5 days. 1 heavy day. Super plus tampon every 2 hours. No clotting. No dysmenorrhea.  The following portions of the patient's history were reviewed and updated as appropriate: allergies, current medications, past family history.  Objective:    BP 114/70  Ht 5\' 6"  (1.676 m)  Wt 309 lb (140.161 kg)  BMI 49.9 kg/m2  LMP 05/27/2011    Weight:  Wt Readings from Last 1 Encounters:  06/01/12 309 lb (140.161 kg)          BMI: Body mass index is 49.9 kg/(m^2).  ULTRASOUND: Uterus Anteverted    Adnexa Normal     Endometrium 0.941 cm    Free fluid: no    Other findings:  Endometrium difficult to evaluate due to adjacent fibroid Enlarged with diffuse fibroid involvement. Largest measured  Fibroid 1. Left-lateral fundus-subserosal: 4.5 x 4.3 x 5.5 cm Fibroid 2. Right-anterior-intramural: 6.9 x 5.6 x 7.3   Assessment:   Uterine fibroids   Plan:    The following was reviewed with patient:    Benign nature of fibroids as well as unpredictable growth during reproductive years.      Possible fibroid symptoms including: menorrhagia, dysmenorrhea, urinary frequency,       pelvic pain, and back pain.     Expected resolution/improvement of symptoms with menopausal state.    Treatment options: expectant management, NSAIDS, oral contraceptives,       Depo Provera, Uterine Artery Embolization, myomectomy, and hysterectomy.   After 40  minutes face to face encounter, the patient requests to proceed with trial of Ibuprofen 600 mg during cycle    Silverio Lay MD

## 2012-06-09 ENCOUNTER — Encounter (INDEPENDENT_AMBULATORY_CARE_PROVIDER_SITE_OTHER): Payer: Self-pay | Admitting: Surgery

## 2012-06-09 ENCOUNTER — Ambulatory Visit (INDEPENDENT_AMBULATORY_CARE_PROVIDER_SITE_OTHER): Payer: BC Managed Care – PPO | Admitting: Surgery

## 2012-06-09 VITALS — BP 142/86 | HR 84 | Temp 97.6°F | Resp 20 | Ht 66.0 in | Wt 313.6 lb

## 2012-06-09 DIAGNOSIS — K429 Umbilical hernia without obstruction or gangrene: Secondary | ICD-10-CM

## 2012-06-09 NOTE — Progress Notes (Signed)
The patient comes in for recheck of her umbilical incision. The drainage seemed to stop the day after her last visit. She completed her course of antibiotics. On examination her incision is well healed with no sign of infection. Deep expiration of her umbilicus shows no sign of drainage or discharge. No erythema or induration. The patient may resume full activity and may follow up with [place] as needed.  Wilmon Arms. Corliss Skains, MD, [facility]  06/09/2012 12:06 PM

## 2012-10-31 ENCOUNTER — Other Ambulatory Visit: Payer: Self-pay | Admitting: Family Medicine

## 2012-11-02 ENCOUNTER — Ambulatory Visit: Payer: BC Managed Care – PPO

## 2012-11-02 ENCOUNTER — Ambulatory Visit (INDEPENDENT_AMBULATORY_CARE_PROVIDER_SITE_OTHER): Payer: BC Managed Care – PPO | Admitting: Emergency Medicine

## 2012-11-02 VITALS — BP 140/90 | HR 91 | Temp 98.4°F | Resp 16 | Ht 65.0 in | Wt 314.0 lb

## 2012-11-02 DIAGNOSIS — M25569 Pain in unspecified knee: Secondary | ICD-10-CM

## 2012-11-02 DIAGNOSIS — M25561 Pain in right knee: Secondary | ICD-10-CM

## 2012-11-02 MED ORDER — CYCLOBENZAPRINE HCL 10 MG PO TABS: 10.0000 mg | ORAL_TABLET | Freq: Every evening | ORAL | Status: DC | PRN

## 2012-11-02 NOTE — Progress Notes (Signed)
  Subjective:    Patient ID: Amanda Paul, female    DOB: [birth year], 37 y.o.   MRN: [id number]  HPI  2 weeks ago right knee started hurting and feeling like it's getting stuck and over the last week pt has been having to squat a lot more and now it's getting stiff. Gets better with pro longed walking, worse in the morning and after getting up from squatting. Knee hasn't given way and pt has no h/o knee injuries. It does feel like it's a little swollen.  LMP 2 weeks ago and no chance of pregnancy, uses condoms   Review of Systems     Objective:   Physical Examthere is tenderness lateral to the patella of the right knee. There is a popping sensation with flexion-extension. McMurray testing is negative. Anterior drawer testing is negative there is no joint effusion noted.  [facility] reading (PRIMARY) by  Dr. Cleta Alberts there is a abnormal area on the medial portion of the proximal tibia which may represent a growth off of the tibia versus calcification at the muscular attachment.  Results for orders placed in visit on 11/02/12  POCT URINE PREGNANCY      Result Value Range   Preg Test, Ur Negative          Assessment & Plan:  We'll go ahead and do a baseline hCG. If this is negative we'll do series of the right knee. I suspect this is symptomatic plica involving the right knee.she will be on ibuprofen and Flexeril at night. She was advised to take it easy and apply ice to the area.

## 2012-11-02 NOTE — Patient Instructions (Signed)
Please call if not better in 3-4 days and we will set up and Orthopedic appointment for you

## 2012-12-07 ENCOUNTER — Telehealth: Payer: Self-pay

## 2012-12-07 NOTE — Telephone Encounter (Signed)
Printed patients AVS for July's visit. In pick up drawer for her. She may come up front desk and call it a "discharge summary". Its her AVS.

## 2012-12-07 NOTE — Telephone Encounter (Signed)
Patient wants a copy of her discharge summary??? Needs to know if she has to come in a fill out another release of info form. States she did one last month or so.  [phone].

## 2013-02-15 ENCOUNTER — Other Ambulatory Visit: Payer: Self-pay

## 2013-06-06 ENCOUNTER — Other Ambulatory Visit: Payer: Self-pay | Admitting: Obstetrics and Gynecology

## 2013-06-06 DIAGNOSIS — Z1231 Encounter for screening mammogram for malignant neoplasm of breast: Secondary | ICD-10-CM

## 2013-06-12 ENCOUNTER — Telehealth: Payer: Self-pay

## 2013-06-12 NOTE — Telephone Encounter (Signed)
She ca  have a copy of her note from that visit or she can return to clinic to reevaluate what is going on

## 2013-06-12 NOTE — Telephone Encounter (Signed)
PT WOULD LIKE TO KNOW IF DR DAUB WOULD WRITE A LETTER STATING SHE NEED TO WEAR A MORE COMFORTABLE SHOE, LIKE AN ATHLETIC TYPE BECAUSE SHE WAS SEEN FOR HER KNEE LAST YEAR AND HAVE FOUND OUT THAT STANDING ON HER FEET FOR 8 HRS IN A DRESSY SHOE REALLY BOTHERS HER KNEE SINCE THAT IS WHAT HER JOB REQUIRES PLEASE CALL [phone] AND LEAVE MESSAGE

## 2013-06-12 NOTE — Telephone Encounter (Signed)
Can you do this from last year's visit or should I have her rtc.

## 2013-06-14 NOTE — Telephone Encounter (Signed)
Lm for rtn call 

## 2013-06-15 NOTE — Telephone Encounter (Signed)
Spoke to patient, she is aware she will need to RTC.  Patient states she works at [employer] and has to wear dress shoes which causes knee and foot pain. Stated she would RTC for a re-eval  For her knee and foot pain.

## 2013-07-02 ENCOUNTER — Ambulatory Visit
Admission: RE | Admit: 2013-07-02 | Discharge: 2013-07-02 | Disposition: A | Discharge status: Home / Self Care | Payer: Self-pay | Source: Ambulatory Visit | Attending: Obstetrics and Gynecology | Admitting: Obstetrics and Gynecology

## 2013-07-02 DIAGNOSIS — Z1231 Encounter for screening mammogram for malignant neoplasm of breast: Secondary | ICD-10-CM

## 2013-11-11 ENCOUNTER — Ambulatory Visit (INDEPENDENT_AMBULATORY_CARE_PROVIDER_SITE_OTHER): Payer: Self-pay | Admitting: Family Medicine

## 2013-11-11 VITALS — BP 135/82 | HR 69 | Temp 98.5°F | Resp 18 | Ht 65.0 in | Wt 294.0 lb

## 2013-11-11 DIAGNOSIS — S61209A Unspecified open wound of unspecified finger without damage to nail, initial encounter: Secondary | ICD-10-CM

## 2013-11-11 DIAGNOSIS — T148XXA Other injury of unspecified body region, initial encounter: Secondary | ICD-10-CM

## 2013-11-11 DIAGNOSIS — S61219A Laceration without foreign body of unspecified finger without damage to nail, initial encounter: Secondary | ICD-10-CM

## 2013-11-11 DIAGNOSIS — IMO0002 Reserved for concepts with insufficient information to code with codable children: Secondary | ICD-10-CM

## 2013-11-11 NOTE — Progress Notes (Signed)
Procedure:  Consent obtained.  Local anesthesia with 2% lidocaine.  Cleaned the wound and closed it with 5-0 Ethilon #3 SI sutures.  Drsg placed.  Wound care d/w pt.

## 2013-11-11 NOTE — Progress Notes (Signed)
   Subjective:    Patient ID: Amanda Paul, female    DOB: [birth year], 38 y.o.   MRN: [id number] This chart was scribed for Shawnee Knapp, MD by Cathie Hoops, ED Scribe. The patient was seen in Room 2. The patient's care was started at 4:55 PM.   Chief Complaint  Patient presents with  . Laceration    finger, today, right hand, while cooking   Past Medical History  Diagnosis Date  . Allergy   . Arthritis   . Hypertension   . Morbid obesity   . Hernia, umbilical    No current outpatient prescriptions on file prior to visit.   No current facility-administered medications on file prior to visit.   Allergies  Allergen Reactions  . Amoxicillin Hives and Itching    Swollen eyes and throat.  Marland Kitchen Penicillins Hives and Swelling    HPI HPI Comments: Amanda Paul is a 38 y.o. female who presents to the [facility] complaining of laceration on her right hand.  Pt reports opening a can and cut her right second finger on the distal pad with the can. Her Tetanus was UTD done within the past 2 years at her PCP's office. She was able to bandage it and continue cooking, as this happened several hours before her arrival.  Review of Systems  Constitutional: Negative for fever and fatigue.  HENT: Negative for drooling.   Respiratory: Negative for cough and shortness of breath.   Gastrointestinal: Negative for nausea, vomiting, abdominal pain and diarrhea.  Musculoskeletal: Negative for neck pain.  Skin: Positive for wound. Negative for color change.  Neurological: Negative for dizziness and headaches.  Psychiatric/Behavioral: Negative for behavioral problems.  All other systems reviewed and are negative.  Objective:   Physical Exam  Nursing note and vitals reviewed. Constitutional: She is oriented to person, place, and time. She appears well-developed.  HENT:  Head: Normocephalic.  Eyes: Conjunctivae and EOM are normal. No scleral icterus.  Neck: Neck supple. No  thyromegaly present.  Cardiovascular: Normal rate, regular rhythm and normal heart sounds.   No murmur heard. Pulmonary/Chest: Breath sounds normal. She has no wheezes. She has no rales. She exhibits no tenderness.  Abdominal: Soft.  Musculoskeletal: Normal range of motion. She exhibits no edema.  Lymphadenopathy:    She has no cervical adenopathy.  Neurological: She is oriented to person, place, and time.  Skin: Skin is warm and dry. Laceration noted. No erythema.  Superficial laceration confined to dermis.   Psychiatric: She has a normal mood and affect. Her behavior is normal.   Triage Vitals: BP 135/82  Pulse 69  Temp(Src) 98.5 F (36.9 C)  Resp 18  Ht 5\' 5"  (1.651 m)  Wt 294 lb (133.358 kg)  BMI 48.92 kg/m2  SpO2 100%  Assessment & Plan:  5:01 PM- Patient informed of current plan for treatment and evaluation and agrees with plan at this time. Laceration  Laceration of finger of right hand, initial encounter  No orders of the defined types were placed in this encounter.    I personally performed the services described in this documentation, which was scribed in my presence. The recorded information has been reviewed and considered, and addended by me as needed.  Delman Cheadle, MD MPH

## 2013-11-18 ENCOUNTER — Ambulatory Visit (INDEPENDENT_AMBULATORY_CARE_PROVIDER_SITE_OTHER): Payer: Self-pay | Admitting: Physician Assistant

## 2013-11-18 VITALS — BP 134/84 | HR 78 | Temp 98.1°F | Resp 16 | Ht 65.0 in | Wt 299.6 lb

## 2013-11-18 DIAGNOSIS — Z5189 Encounter for other specified aftercare: Secondary | ICD-10-CM

## 2013-11-18 DIAGNOSIS — S61209A Unspecified open wound of unspecified finger without damage to nail, initial encounter: Secondary | ICD-10-CM

## 2013-11-18 DIAGNOSIS — S61209D Unspecified open wound of unspecified finger without damage to nail, subsequent encounter: Secondary | ICD-10-CM

## 2013-11-18 NOTE — Progress Notes (Signed)
   Subjective:    Patient ID: Amanda Paul, female    DOB: 12/06/1975, 38 y.o.   MRN: [id number]  HPI 38 year old female presents for suture removal.  DOI 11/11/13. Doing well without any issues or complaints. Denies erythema, warmth, or drainage. Admits she is working hard to keep it clean and dry because she works in a [employer] and at a Human resources [employer]. Reports no concerns about the wound.     Review of Systems  Constitutional: Negative for fever and chills.  Gastrointestinal: Negative for nausea and vomiting.  Skin: Positive for wound.  Neurological: Negative for headaches.       Objective:   Physical Exam  Constitutional: She is oriented to person, place, and time. She appears well-developed and well-nourished.  HENT:  Head: Normocephalic and atraumatic.  Right Ear: External ear normal.  Left Ear: External ear normal.  Mouth/Throat: Uvula is midline, oropharynx is clear and moist and mucous membranes are normal.  Eyes: Conjunctivae are normal.  Neck: Normal range of motion.  Cardiovascular: Normal rate.   Pulmonary/Chest: Effort normal.  Neurological: She is alert and oriented to person, place, and time.  Skin:  Wound on pad of index finger well healing with sutures in place. No erythema, warmth, or drainage.   Psychiatric: She has a normal mood and affect. Her behavior is normal. Judgment and thought content normal.     #3 sutures removed without difficulty. Patient tolerated well.      Assessment & Plan:  Open wound of finger, subsequent encounter  Sutures removed. Wound well healing.  Follow up as needed.

## 2014-05-28 ENCOUNTER — Other Ambulatory Visit: Payer: Self-pay

## 2014-05-28 DIAGNOSIS — Z1231 Encounter for screening mammogram for malignant neoplasm of breast: Secondary | ICD-10-CM

## 2014-07-04 ENCOUNTER — Ambulatory Visit
Admission: RE | Admit: 2014-07-04 | Discharge: 2014-07-04 | Disposition: A | Discharge status: Home / Self Care | Payer: BLUE CROSS/BLUE SHIELD | Source: Ambulatory Visit

## 2014-07-04 DIAGNOSIS — Z1231 Encounter for screening mammogram for malignant neoplasm of breast: Secondary | ICD-10-CM

## 2014-08-02 ENCOUNTER — Other Ambulatory Visit: Payer: Self-pay | Admitting: Obstetrics and Gynecology

## 2014-08-12 ENCOUNTER — Encounter (HOSPITAL_COMMUNITY): Payer: Self-pay | Admitting: General Practice

## 2014-08-22 NOTE — H&P (Signed)
   Reason for admission:   Hysteroscopy with polypectomy  History:     Amanda Paul is a 39 y.o. female, G0P0000, presenting today to undergo hysteroscopy with polypectomy. Patient was evaluated at [facility]  07/04/14 for DUB with an endometrial biopsy revealing benign endometrium suggestive of polyp. She was brought back for a saline infusion sonogram which confirmed a 2.2 cm endometrial polyp.  Review of system:  Non-contributory   Past Medical History:   Past Medical History  Diagnosis Date  . Allergy   . Arthritis   . Morbid obesity   . 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 No acute distress Neck / Thyroid: Supple, no masses, nodes or enlargement Lungs: clear to auscultation bilaterally Back: No CVA tenderness. Cardiovascular: Regular rate and rhythm. S1, S2, no murmur Gastrointestinal: Soft, non-tender, no masses or organomegaly Pelvic Exam: Vulva and vagina appear normal. Bimanual exam reveals normal uterus and adnexa.   Assessment:   DUB ( menorrhagia) with endometrial polyp   Plan:    Proceed with Hysteroscopy / polypectomy/ D&C. Procedure, risks and benefits reviewed with patient including but not limited to bleeding, infection, injury to uterus and subsequently to intra-abdominal organs and expected recovery. Patient voices understanding and is agreeable to proceed.      Delsa Bern A MD

## 2014-08-25 MED ORDER — DEXTROSE 5 % IV SOLN
INTRAVENOUS | Status: AC
  Administered 2014-08-26: 13:00:00 via INTRAVENOUS
  Filled 2014-08-25: qty 11

## 2014-08-26 ENCOUNTER — Ambulatory Visit (HOSPITAL_COMMUNITY)
Admission: RE | Admit: 2014-08-26 | Discharge: 2014-08-26 | Disposition: A | Discharge status: Home / Self Care | Payer: Managed Care, Other (non HMO) | Source: Ambulatory Visit | Attending: Obstetrics and Gynecology | Admitting: Obstetrics and Gynecology

## 2014-08-26 ENCOUNTER — Encounter (HOSPITAL_COMMUNITY): Payer: Self-pay | Admitting: Emergency Medicine

## 2014-08-26 ENCOUNTER — Ambulatory Visit (HOSPITAL_COMMUNITY): Payer: Managed Care, Other (non HMO) | Admitting: Certified Registered"

## 2014-08-26 ENCOUNTER — Encounter (HOSPITAL_COMMUNITY)
Admission: RE | Disposition: A | Discharge status: Home / Self Care | Payer: Self-pay | Source: Ambulatory Visit | Attending: Obstetrics and Gynecology

## 2014-08-26 DIAGNOSIS — Z6841 Body Mass Index (BMI) 40.0 and over, adult: Secondary | ICD-10-CM | POA: Insufficient documentation

## 2014-08-26 DIAGNOSIS — N840 Polyp of corpus uteri: Secondary | ICD-10-CM | POA: Diagnosis present

## 2014-08-26 DIAGNOSIS — Z881 Allergy status to other antibiotic agents status: Secondary | ICD-10-CM | POA: Diagnosis not present

## 2014-08-26 DIAGNOSIS — N920 Excessive and frequent menstruation with regular cycle: Secondary | ICD-10-CM | POA: Insufficient documentation

## 2014-08-26 HISTORY — PX: HYSTEROSCOPY WITH D & C: SHX1775

## 2014-08-26 LAB — PREGNANCY, URINE: PREG TEST UR: NEGATIVE

## 2014-08-26 LAB — CBC
HCT: 39 % (ref 36.0–46.0)
HEMOGLOBIN: 12.7 g/dL (ref 12.0–15.0)
MCH: 28.4 pg (ref 26.0–34.0)
MCHC: 32.6 g/dL (ref 30.0–36.0)
MCV: 87.2 fL (ref 78.0–100.0)
PLATELETS: 310 10*3/uL (ref 150–400)
RBC: 4.47 MIL/uL (ref 3.87–5.11)
RDW: 13.7 % (ref 11.5–15.5)
WBC: 7 10*3/uL (ref 4.0–10.5)

## 2014-08-26 SURGERY — DILATATION AND CURETTAGE /HYSTEROSCOPY
Anesthesia: General | Site: Uterus

## 2014-08-26 MED ORDER — FENTANYL CITRATE (PF) 100 MCG/2ML IJ SOLN
INTRAMUSCULAR | Status: AC
  Filled 2014-08-26: qty 2

## 2014-08-26 MED ORDER — SODIUM CHLORIDE 0.9 % IR SOLN
Status: DC | PRN
  Administered 2014-08-26: 6000 mL

## 2014-08-26 MED ORDER — PROPOFOL 10 MG/ML IV BOLUS
INTRAVENOUS | Status: AC
  Filled 2014-08-26: qty 20

## 2014-08-26 MED ORDER — DEXAMETHASONE SODIUM PHOSPHATE 4 MG/ML IJ SOLN
INTRAMUSCULAR | Status: AC
  Filled 2014-08-26: qty 1

## 2014-08-26 MED ORDER — ONDANSETRON HCL 4 MG/2ML IJ SOLN
INTRAMUSCULAR | Status: AC
  Filled 2014-08-26: qty 2

## 2014-08-26 MED ORDER — PROPOFOL 10 MG/ML IV BOLUS
INTRAVENOUS | Status: DC | PRN
  Administered 2014-08-26: 50 mg via INTRAVENOUS
  Administered 2014-08-26: 200 mg via INTRAVENOUS

## 2014-08-26 MED ORDER — LACTATED RINGERS IV SOLN
INTRAVENOUS | Status: DC
  Administered 2014-08-26 (×3): via INTRAVENOUS

## 2014-08-26 MED ORDER — DEXAMETHASONE SODIUM PHOSPHATE 10 MG/ML IJ SOLN
INTRAMUSCULAR | Status: DC | PRN
  Administered 2014-08-26: 4 mg via INTRAVENOUS

## 2014-08-26 MED ORDER — CHLOROPROCAINE HCL 1 % IJ SOLN
INTRAMUSCULAR | Status: AC
  Filled 2014-08-26: qty 30

## 2014-08-26 MED ORDER — LIDOCAINE HCL (PF) 1 % IJ SOLN
INTRAMUSCULAR | Status: AC
  Filled 2014-08-26: qty 5

## 2014-08-26 MED ORDER — IBUPROFEN 600 MG PO TABS: 600.0000 mg | ORAL_TABLET | Freq: Four times a day (QID) | ORAL | Status: DC | PRN

## 2014-08-26 MED ORDER — ACETAMINOPHEN 160 MG/5ML PO SOLN: 325.0000 mg | ORAL | Status: DC | PRN

## 2014-08-26 MED ORDER — MIDAZOLAM HCL 2 MG/2ML IJ SOLN
INTRAMUSCULAR | Status: AC
  Filled 2014-08-26: qty 2

## 2014-08-26 MED ORDER — MIDAZOLAM HCL 2 MG/2ML IJ SOLN
INTRAMUSCULAR | Status: DC | PRN
  Administered 2014-08-26 (×2): 1 mg via INTRAVENOUS

## 2014-08-26 MED ORDER — ACETAMINOPHEN 325 MG PO TABS: 325.0000 mg | ORAL_TABLET | ORAL | Status: DC | PRN

## 2014-08-26 MED ORDER — FENTANYL CITRATE (PF) 100 MCG/2ML IJ SOLN: 25.0000 ug | INTRAMUSCULAR | Status: DC | PRN

## 2014-08-26 MED ORDER — SCOPOLAMINE 1 MG/3DAYS TD PT72
MEDICATED_PATCH | TRANSDERMAL | Status: AC
  Administered 2014-08-26: 1.5 mg via TRANSDERMAL
  Filled 2014-08-26: qty 1

## 2014-08-26 MED ORDER — SCOPOLAMINE 1 MG/3DAYS TD PT72
1.0000 | MEDICATED_PATCH | Freq: Once | TRANSDERMAL | Status: DC
  Administered 2014-08-26: 1.5 mg via TRANSDERMAL

## 2014-08-26 MED ORDER — ONDANSETRON HCL 4 MG/2ML IJ SOLN
INTRAMUSCULAR | Status: DC | PRN
  Administered 2014-08-26: 4 mg via INTRAVENOUS

## 2014-08-26 MED ORDER — FENTANYL CITRATE (PF) 100 MCG/2ML IJ SOLN
INTRAMUSCULAR | Status: DC | PRN
  Administered 2014-08-26: 25 ug via INTRAVENOUS
  Administered 2014-08-26: 50 ug via INTRAVENOUS
  Administered 2014-08-26 (×3): 25 ug via INTRAVENOUS
  Administered 2014-08-26 (×2): 50 ug via INTRAVENOUS
  Administered 2014-08-26 (×2): 25 ug via INTRAVENOUS

## 2014-08-26 MED ORDER — CHLOROPROCAINE HCL 1 % IJ SOLN
INTRAMUSCULAR | Status: DC | PRN
  Administered 2014-08-26: 15 mL

## 2014-08-26 MED ORDER — DEXAMETHASONE SODIUM PHOSPHATE 10 MG/ML IJ SOLN
INTRAMUSCULAR | Status: AC
  Filled 2014-08-26: qty 1

## 2014-08-26 SURGICAL SUPPLY — 20 items
CANISTER SUCT 3000ML (MISCELLANEOUS) ×5 IMPLANT
CATH ROBINSON RED A/P 16FR (CATHETERS) ×3 IMPLANT
CLOTH BEACON ORANGE TIMEOUT ST (SAFETY) ×3 IMPLANT
CONTAINER PREFILL 10% NBF 60ML (FORM) ×4 IMPLANT
ELECT REM PT RETURN 9FT ADLT (ELECTROSURGICAL) ×3
ELECTRODE REM PT RTRN 9FT ADLT (ELECTROSURGICAL) ×2 IMPLANT
ELECTRODE RT ANGLE VERSAPOINT (CUTTING LOOP) ×2 IMPLANT
GLOVE BIOGEL PI IND STRL 6 (GLOVE) ×3 IMPLANT
GLOVE BIOGEL PI IND STRL 7.0 (GLOVE) ×2 IMPLANT
GLOVE BIOGEL PI INDICATOR 6 (GLOVE) ×3
GLOVE BIOGEL PI INDICATOR 7.0 (GLOVE) ×1
GLOVE ECLIPSE 6.5 STRL STRAW (GLOVE) ×8 IMPLANT
GLOVE SURG SS PI 7.0 STRL IVOR (GLOVE) ×10 IMPLANT
GOWN STRL REUS W/TWL LRG LVL3 (GOWN DISPOSABLE) ×8 IMPLANT
PACK VAGINAL MINOR WOMEN LF (CUSTOM PROCEDURE TRAY) ×3 IMPLANT
PAD OB MATERNITY 4.3X12.25 (PERSONAL CARE ITEMS) ×3 IMPLANT
TOWEL OR 17X24 6PK STRL BLUE (TOWEL DISPOSABLE) ×3 IMPLANT
TUBING AQUILEX INFLOW (TUBING) ×3 IMPLANT
TUBING AQUILEX OUTFLOW (TUBING) ×3 IMPLANT
WATER STERILE IRR 1000ML POUR (IV SOLUTION) ×3 IMPLANT

## 2014-08-26 NOTE — Interval H&P Note (Signed)
History and Physical Interval Note:  08/26/2014 11:53 AM  Amanda Paul  has presented today for surgery, with the diagnosis of Menorrhagia with Endometrial Polyps  The various methods of treatment have been discussed with the patient and family. After consideration of risks, benefits and other options for treatment, the patient has consented to  Procedure(s): HYSTEROSCOPY WITH RESECTOSCOPE (N/A) as a surgical intervention .  The patient's history has been reviewed, patient examined, no change in status, stable for surgery.  I have reviewed the patient's chart and labs.  Questions were answered to the patient's satisfaction.     Marcos Ruelas A

## 2014-08-26 NOTE — Anesthesia Preprocedure Evaluation (Signed)
Anesthesia Evaluation  Patient identified by MRN, date of birth, ID band Patient awake    Reviewed: Allergy & Precautions, NPO status , Patient's Chart, lab work & pertinent test results  History of Anesthesia Complications Negative for: history of anesthetic complications  Airway Mallampati: II  TM Distance: >3 FB Neck ROM: Full    Dental  (+) Teeth Intact   Pulmonary neg pulmonary ROS,  breath sounds clear to auscultation        Cardiovascular negative cardio ROS  Rhythm:Regular     Neuro/Psych negative neurological ROS  negative psych ROS   GI/Hepatic negative GI ROS, Neg liver ROS,   Endo/Other  Morbid obesity  Renal/GU negative Renal ROS     Musculoskeletal negative musculoskeletal ROS (+)   Abdominal   Peds  Hematology negative hematology ROS (+)   Anesthesia Other Findings   Reproductive/Obstetrics                             Anesthesia Physical Anesthesia Plan  ASA: II  Anesthesia Plan: General   Post-op Pain Management:    Induction: Intravenous  Airway Management Planned: LMA  Additional Equipment: None  Intra-op Plan:   Post-operative Plan: Extubation in OR  Informed Consent: I have reviewed the patients History and Physical, chart, labs and discussed the procedure including the risks, benefits and alternatives for the proposed anesthesia with the patient or authorized representative who has indicated his/her understanding and acceptance.   Dental advisory given  Plan Discussed with: CRNA and Surgeon  Anesthesia Plan Comments:         Anesthesia Quick Evaluation

## 2014-08-26 NOTE — Discharge Instructions (Signed)
POST-OPERATIVE INSTRUCTIONS TO PATIENT  Call [facility]  [phone])  for excessive pain, bleeding or temperature greater than or equal to 100.4 degrees (orally).    No driving for 1 day No sexual activity for 1 week  Pain management:  Use Ibuprofen 600 mg every 6 hours for 5 days and then as needed. Use your pain medication as needed to maintain a pain level at or below 3/10 Use Colace 1-2 capsules per day as long as you are using pain medication to avoid constipation.       Diet: normal  Bathing: may shower day after surgery  Wound Care: keep incisions clean and dry  Return to Dr. Cletis Media as scheduled  Return to work: To be determined at post operative visit.    ZLDJTT,SVXBLT A MD 08/26/2014 1:44 PM   Post Anesthesia Home Care Instructions  Activity: Get plenty of rest for the remainder of the day. A responsible adult should stay with you for 24 hours following the procedure.  For the next 24 hours, DO NOT: -Drive a car -Paediatric nurse -Drink alcoholic beverages -Take any medication unless instructed by your physician -Make any legal decisions or sign important papers.  Meals: Start with liquid foods such as gelatin or soup. Progress to regular foods as tolerated. Avoid greasy, spicy, heavy foods. If nausea and/or vomiting occur, drink only clear liquids until the nausea and/or vomiting subsides. Call your physician if vomiting continues.  Special Instructions/Symptoms: Your throat may feel dry or sore from the anesthesia or the breathing tube placed in your throat during surgery. If this causes discomfort, gargle with warm salt water. The discomfort should disappear within 24 hours.  If you had a scopolamine patch placed behind your ear for the management of post- operative nausea and/or vomiting:  1. The medication in the patch is effective for 72 hours, after which it should be removed.  Wrap patch in a tissue and discard in the trash. Wash hands  thoroughly with soap and water. 2. You may remove the patch earlier than 72 hours if you experience unpleasant side effects which may include dry mouth, dizziness or visual disturbances. 3. Avoid touching the patch. Wash your hands with soap and water after contact with the patch.

## 2014-08-26 NOTE — Anesthesia Postprocedure Evaluation (Signed)
  Anesthesia Post-op Note  Patient: Amanda Paul  Procedure(s) Performed: Procedure(s): DILATATION AND CURETTAGE /HYSTEROSCOPY and cervical repair of cervical laceration  (N/A) Patient is awake and responsive. Pain and nausea are reasonably well controlled. Vital signs are stable and clinically acceptable. Oxygen saturation is clinically acceptable. There are no apparent anesthetic complications at this time. Patient is ready for discharge.

## 2014-08-26 NOTE — Op Note (Addendum)
Preop diagnosis: menorrhagia with endometrial polyp  Postop diagnosis: menorrhagia  Anesthesia: IV sedation with laryngeal mask  Anesthesiologist: Dr. Georgann Housekeeper  Procedure: Hysteroscopy, dilatation and curettage  Surgeon: Dr. Katharine Look Thelonious Kauffmann  Procedure: After being informed of the planned procedure with possible complications including bleeding, infection and uterine perforation, informed consent was obtained and patient was taken to or #8.  She was given IV sedation anesthesia without complication. She was placed in a dorsal decubitus position, prepped and draped in the sterile fashion and her bladder. Pelvic exam reveals anteverted enlarged utreus.  A weighted speculum is inserted in the vagina. The cervix was grasped with a tenaculum forcep placed on the anterior lip.We proceed with a paracervical block using 1% Nesacaine, 15 cc. Uterus is sounded at 11. The cervix is then easily dilated using Hegar dilator until # 31. This allows for easy placement of an operative hysteroscope. With perfusion of normal saline at a maximum pressure of 90 mmHg, we are able to evaluate the entire uterine cavity.  Observation: normal endometrial cavity with no polyp identified   We then removed our instrumentation. Using a sharp curette, we proceed with curettage of the endometrial cavity which returns a small amount of normal-appearing endometrium.We then repair a cervical laceration caused by Tenaculum with a running locked suture of 3-0 Vicryl.  Instruments are then removed. Instrument and sponge count is complete x2. Estimated blood loss is minimal. Water deficit is 200 cc of normal saline.  The procedure is very well tolerated by the patient who is taken to recovery room in a well and stable condition.  Specimen: endometrial curettings sent to pathology.

## 2014-08-26 NOTE — Transfer of Care (Signed)
Immediate Anesthesia Transfer of Care Note  Patient: Amanda Paul  Procedure(s) Performed: Procedure(s): DILATATION AND CURETTAGE /HYSTEROSCOPY and cervical repair of cervical laceration  (N/A)  Patient Location: PACU  Anesthesia Type:General  Level of Consciousness: awake, alert , oriented and patient cooperative  Airway & Oxygen Therapy: Patient Spontanous Breathing and Patient connected to nasal cannula oxygen  Post-op Assessment: Report given to RN and Post -op Vital signs reviewed and stable  Post vital signs: Reviewed and stable  Last Vitals:  Filed Vitals:   08/26/14 1400  BP: 150/82  Pulse: 71  Temp:   Resp: 19    Complications: No apparent anesthesia complications

## 2014-08-27 ENCOUNTER — Encounter (HOSPITAL_COMMUNITY): Payer: Self-pay | Admitting: Obstetrics and Gynecology

## 2014-08-27 NOTE — Anesthesia Procedure Notes (Signed)
Procedure Name: LMA Insertion Date/Time: 08/26/2014 12:43 PM Performed by: Adalberto Ill Pre-anesthesia Checklist: Patient identified, Emergency Drugs available, Suction available, Patient being monitored and Timeout performed Patient Re-evaluated:Patient Re-evaluated prior to inductionOxygen Delivery Method: Circle system utilized Preoxygenation: Pre-oxygenation with 100% oxygen Intubation Type: IV induction Ventilation: Mask ventilation without difficulty LMA: LMA inserted LMA Size: 4.0 Number of attempts: 1 Placement Confirmation: breath sounds checked- equal and bilateral and positive ETCO2 ETT to lip (cm): y. Tube secured with: Tape Dental Injury: Teeth and Oropharynx as per pre-operative assessment

## 2014-08-28 NOTE — Anesthesia Postprocedure Evaluation (Signed)
  Anesthesia Post-op Note  Patient: Amanda Paul  Procedure(s) Performed: Procedure(s): DILATATION AND CURETTAGE /HYSTEROSCOPY and cervical repair of cervical laceration  (N/A)  Patient Location: PACU  Anesthesia Type:MAC  Level of Consciousness: awake  Airway and Oxygen Therapy: Patient Spontanous Breathing  Post-op Pain: none  Post-op Assessment: Post-op Vital signs reviewed, Patient's Cardiovascular Status Stable, Respiratory Function Stable, Patent Airway, No signs of Nausea or vomiting and Pain level controlled  Post-op Vital Signs: Reviewed and stable  Last Vitals:  Filed Vitals:   08/26/14 1500  BP: 172/85  Pulse: 75  Temp: 36.7 C  Resp: 18    Complications: No apparent anesthesia complications

## 2015-09-30 ENCOUNTER — Ambulatory Visit (INDEPENDENT_AMBULATORY_CARE_PROVIDER_SITE_OTHER): Payer: Managed Care, Other (non HMO) | Admitting: Emergency Medicine

## 2015-09-30 ENCOUNTER — Other Ambulatory Visit: Payer: Self-pay | Admitting: Obstetrics and Gynecology

## 2015-09-30 VITALS — BP 132/88 | HR 80 | Temp 98.8°F | Resp 16 | Ht 65.0 in | Wt 299.0 lb

## 2015-09-30 DIAGNOSIS — Z13220 Encounter for screening for lipoid disorders: Secondary | ICD-10-CM | POA: Diagnosis not present

## 2015-09-30 DIAGNOSIS — N852 Hypertrophy of uterus: Secondary | ICD-10-CM

## 2015-09-30 DIAGNOSIS — Z0000 Encounter for general adult medical examination without abnormal findings: Secondary | ICD-10-CM

## 2015-09-30 DIAGNOSIS — D259 Leiomyoma of uterus, unspecified: Secondary | ICD-10-CM | POA: Diagnosis not present

## 2015-09-30 DIAGNOSIS — Z131 Encounter for screening for diabetes mellitus: Secondary | ICD-10-CM | POA: Diagnosis not present

## 2015-09-30 DIAGNOSIS — Z1231 Encounter for screening mammogram for malignant neoplasm of breast: Secondary | ICD-10-CM

## 2015-09-30 DIAGNOSIS — D219 Benign neoplasm of connective and other soft tissue, unspecified: Secondary | ICD-10-CM | POA: Insufficient documentation

## 2015-09-30 LAB — POCT CBC
Granulocyte percent: 59.9 %G (ref 37–80)
HEMATOCRIT: 35.2 % — AB (ref 37.7–47.9)
Hemoglobin: 12 g/dL — AB (ref 12.2–16.2)
Lymph, poc: 2 (ref 0.6–3.4)
MCH, POC: 27.5 pg (ref 27–31.2)
MCHC: 34 g/dL (ref 31.8–35.4)
MCV: 80.9 fL (ref 80–97)
MID (CBC): 0.4 (ref 0–0.9)
MPV: 6.8 fL (ref 0–99.8)
POC GRANULOCYTE: 3.5 (ref 2–6.9)
POC LYMPH %: 34.1 % (ref 10–50)
POC MID %: 6 %M (ref 0–12)
Platelet Count, POC: 303 10*3/uL (ref 142–424)
RBC: 4.36 M/uL (ref 4.04–5.48)
RDW, POC: 15.4 %
WBC: 5.9 10*3/uL (ref 4.6–10.2)

## 2015-09-30 LAB — GLUCOSE, POCT (MANUAL RESULT ENTRY): POC Glucose: 87 mg/dl (ref 70–99)

## 2015-09-30 LAB — LIPID PANEL
Cholesterol: 230 mg/dL — ABNORMAL HIGH (ref 125–200)
HDL: 84 mg/dL (ref >=46)
LDL Cholesterol: 135 mg/dL — ABNORMAL HIGH (ref <130)
Total CHOL/HDL Ratio: 2.7 Ratio (ref <=5.0)
Triglycerides: 53 mg/dL (ref <150)
VLDL: 11 mg/dL (ref <30)

## 2015-09-30 LAB — POCT URINALYSIS DIP (MANUAL ENTRY)
BILIRUBIN UA: NEGATIVE
Blood, UA: NEGATIVE
GLUCOSE UA: NEGATIVE
Ketones, POC UA: NEGATIVE
LEUKOCYTES UA: NEGATIVE
Nitrite, UA: NEGATIVE
PH UA: 7
Protein Ur, POC: NEGATIVE
Spec Grav, UA: 1.02
UROBILINOGEN UA: 0.2

## 2015-09-30 LAB — POC MICROSCOPIC URINALYSIS (UMFC): Mucus: ABSENT

## 2015-09-30 LAB — POCT URINE PREGNANCY: Preg Test, Ur: NEGATIVE

## 2015-09-30 LAB — TSH: TSH: 1.57 mIU/L

## 2015-09-30 LAB — POCT GLYCOSYLATED HEMOGLOBIN (HGB A1C): HEMOGLOBIN A1C: 5.9

## 2015-09-30 NOTE — Patient Instructions (Addendum)
IF you received an x-ray today, you will receive an invoice from [facility]. Please contact [facility] at [phone] with questions or concerns regarding your invoice.   IF you received labwork today, you will receive an invoice from [employer]. Please contact [facility] at [phone] with questions or concerns regarding your invoice.   Our billing staff will not be able to assist you with questions regarding bills from these companies.  You will be contacted with the lab results as soon as they are available. The fastest way to get your results is to activate your My Chart account. Instructions are located on the last page of this paperwork. If you have not heard from [place] regarding the results in 2 weeks, please contact this office.     We recommend that you schedule a mammogram for breast cancer screening. Typically, you do not need a referral to do this. Please contact a local imaging center to schedule your mammogram.  [facility] - [phone]  *ask for the [facility] [place] ([place]) - [phone] or [phone]  [facility] - [phone] [place] [phone] [facility]  - [phone]  *ask for the [facility] - [phone]  *ask for the Radiology Department [facility] - [phone]  *ask for the [place] - [phone]   Obesity Obesity is defined as having too much total body fat and a body mass index (BMI) of 30 or more. BMI is an estimate of body fat and is calculated from your height and weight. BMI is typically calculated by your health care provider during regular wellness visits. Obesity happens when you consume more calories than you can burn by exercising or performing daily physical tasks. Prolonged obesity can cause major illnesses or  emergencies, such as:  Stroke.  Heart disease.  Diabetes.  Cancer.  Arthritis.  High blood pressure (hypertension).  High cholesterol.  Sleep apnea.  Erectile dysfunction.  Infertility problems. CAUSES   Regularly eating unhealthy foods.  Physical inactivity.  Certain disorders, such as an underactive thyroid (hypothyroidism), Cushing's syndrome, and polycystic ovarian syndrome.  Certain medicines, such as steroids, some depression medicines, and antipsychotics.  Genetics.  Lack of sleep. DIAGNOSIS A health care provider can diagnose obesity after calculating your BMI. Obesity will be diagnosed if your BMI is 30 or higher. There are other methods of measuring obesity levels. Some other methods include measuring your skinfold thickness, your waist circumference, and comparing your hip circumference to your waist circumference. TREATMENT  A healthy treatment program includes some or all of the following:  Long-term dietary changes.  Exercise and physical activity.  Behavioral and lifestyle changes.  Medicine only under the supervision of your health care provider. Medicines may help, but only if they are used with diet and exercise programs. If your BMI is 40 or higher, your health care provider may recommend specialized surgery or programs to help with weight loss. An unhealthy treatment program includes:  Fasting.  Fad diets.  Supplements and drugs. These choices do not succeed in long-term weight control. HOME CARE INSTRUCTIONS  Exercise and perform physical activity as directed by your health care provider. To increase physical activity, try the following:  Use stairs instead of elevators.  Park farther away from store entrances.  Garden, bike, or walk instead of watching television or using the computer.  Eat healthy, low-calorie foods and drinks  on a regular basis. Eat more fruits and vegetables. Use low-calorie cookbooks or take healthy cooking  classes.  Limit fast food, sweets, and processed snack foods.  Eat smaller portions.  Keep a daily journal of everything you eat. There are many free websites to help you with this. It may be helpful to measure your foods so you can determine if you are eating the correct portion sizes.  Avoid drinking alcohol. Drink more water and drinks without calories.  Take vitamins and supplements only as recommended by your health care provider.  Weight-loss support groups, Tax adviser, counselors, and stress reduction education can also be very helpful. SEEK IMMEDIATE MEDICAL CARE IF:  You have chest pain or tightness.  You have trouble breathing or feel short of breath.  You have weakness or leg numbness.  You feel confused or have trouble talking.  You have sudden changes in your vision.   This information is not intended to replace advice given to you by your health care provider. Make sure you discuss any questions you have with your health care provider.   Document Released: 05/06/2004 Document Revised: 04/19/2014 Document Reviewed: 05/05/2011 Elsevier Interactive Patient Education 2016 Kinde Maintenance, Female Adopting a healthy lifestyle and getting preventive care can go a long way to promote health and wellness. Talk with your health care provider about what schedule of regular examinations is right for you. This is a good chance for you to check in with your provider about disease prevention and staying healthy. In between checkups, there are plenty of things you can do on your own. Experts have done a lot of research about which lifestyle changes and preventive measures are most likely to keep you healthy. Ask your health care provider for more information. WEIGHT AND DIET  Eat a healthy diet  Be sure to include plenty of vegetables, fruits, low-fat dairy products, and lean protein.  Do not eat a lot of foods high in solid fats, added sugars, or  salt.  Get regular exercise. This is one of the most important things you can do for your health.  Most adults should exercise for at least 150 minutes each week. The exercise should increase your heart rate and make you sweat (moderate-intensity exercise).  Most adults should also do strengthening exercises at least twice a week. This is in addition to the moderate-intensity exercise.  Maintain a healthy weight  Body mass index (BMI) is a measurement that can be used to identify possible weight problems. It estimates body fat based on height and weight. Your health care provider can help determine your BMI and help you achieve or maintain a healthy weight.  For females 55 years of age and older:   A BMI below 18.5 is considered underweight.  A BMI of 18.5 to 24.9 is normal.  A BMI of 25 to 29.9 is considered overweight.  A BMI of 30 and above is considered obese.  Watch levels of cholesterol and blood lipids  You should start having your blood tested for lipids and cholesterol at 42 years of age, then have this test every 5 years.  You may need to have your cholesterol levels checked more often if:  Your lipid or cholesterol levels are high.  You are older than 42 years of age.  You are at high risk for heart disease.  CANCER SCREENING   Lung Cancer  Lung cancer screening is recommended for adults 20-36 years old who are at high risk for lung cancer  because of a history of smoking.  A yearly low-dose CT scan of the lungs is recommended for people who:  Currently smoke.  Have quit within the past 15 years.  Have at least a 30-pack-year history of smoking. A pack year is smoking an average of one pack of cigarettes a day for 1 year.  Yearly screening should continue until it has been 15 years since you quit.  Yearly screening should stop if you develop a health problem that would prevent you from having lung cancer treatment.  Breast Cancer  Practice breast  self-awareness. This means understanding how your breasts normally appear and feel.  It also means doing regular breast self-exams. Let your health care provider know about any changes, no matter how small.  If you are in your 20s or 30s, you should have a clinical breast exam (CBE) by a health care provider every 1-3 years as part of a regular health exam.  If you are 102 or older, have a CBE every year. Also consider having a breast X-ray (mammogram) every year.  If you have a family history of breast cancer, talk to your health care provider about genetic screening.  If you are at high risk for breast cancer, talk to your health care provider about having an MRI and a mammogram every year.  Breast cancer gene (BRCA) assessment is recommended for women who have family members with BRCA-related cancers. BRCA-related cancers include:  Breast.  Ovarian.  Tubal.  Peritoneal cancers.  Results of the assessment will determine the need for genetic counseling and BRCA1 and BRCA2 testing. Cervical Cancer Your health care provider may recommend that you be screened regularly for cancer of the pelvic organs (ovaries, uterus, and vagina). This screening involves a pelvic examination, including checking for microscopic changes to the surface of your cervix (Pap test). You may be encouraged to have this screening done every 3 years, beginning at age 97.  For women ages 28-65, health care providers may recommend pelvic exams and Pap testing every 3 years, or they may recommend the Pap and pelvic exam, combined with testing for human papilloma virus (HPV), every 5 years. Some types of HPV increase your risk of cervical cancer. Testing for HPV may also be done on women of any age with unclear Pap test results.  Other health care providers may not recommend any screening for nonpregnant women who are considered low risk for pelvic cancer and who do not have symptoms. Ask your health care provider if a  screening pelvic exam is right for you.  If you have had past treatment for cervical cancer or a condition that could lead to cancer, you need Pap tests and screening for cancer for at least 20 years after your treatment. If Pap tests have been discontinued, your risk factors (such as having a new sexual partner) need to be reassessed to determine if screening should resume. Some women have medical problems that increase the chance of getting cervical cancer. In these cases, your health care provider may recommend more frequent screening and Pap tests. Colorectal Cancer  This type of cancer can be detected and often prevented.  Routine colorectal cancer screening usually begins at 42 years of age and continues through 42 years of age.  Your health care provider may recommend screening at an earlier age if you have risk factors for colon cancer.  Your health care provider may also recommend using home test kits to check for hidden blood in the stool.  A  small camera at the end of a tube can be used to examine your colon directly (sigmoidoscopy or colonoscopy). This is done to check for the earliest forms of colorectal cancer.  Routine screening usually begins at age 39.  Direct examination of the colon should be repeated every 5-10 years through 42 years of age. However, you may need to be screened more often if early forms of precancerous polyps or small growths are found. Skin Cancer  Check your skin from head to toe regularly.  Tell your health care provider about any new moles or changes in moles, especially if there is a change in a mole's shape or color.  Also tell your health care provider if you have a mole that is larger than the size of a pencil eraser.  Always use sunscreen. Apply sunscreen liberally and repeatedly throughout the day.  Protect yourself by wearing long sleeves, pants, a wide-brimmed hat, and sunglasses whenever you are outside. HEART DISEASE, DIABETES, AND HIGH  BLOOD PRESSURE   High blood pressure causes heart disease and increases the risk of stroke. High blood pressure is more likely to develop in:  People who have blood pressure in the high end of the normal range (130-139/85-89 mm Hg).  People who are overweight or obese.  People who are African American.  If you are 62-20 years of age, have your blood pressure checked every 3-5 years. If you are 27 years of age or older, have your blood pressure checked every year. You should have your blood pressure measured twice--once when you are at a hospital or clinic, and once when you are not at a hospital or clinic. Record the average of the two measurements. To check your blood pressure when you are not at a hospital or clinic, you can use:  An automated blood pressure machine at a pharmacy.  A home blood pressure monitor.  If you are between 54 years and 24 years old, ask your health care provider if you should take aspirin to prevent strokes.  Have regular diabetes screenings. This involves taking a blood sample to check your fasting blood sugar level.  If you are at a normal weight and have a low risk for diabetes, have this test once every three years after 42 years of age.  If you are overweight and have a high risk for diabetes, consider being tested at a younger age or more often. PREVENTING INFECTION  Hepatitis B  If you have a higher risk for hepatitis B, you should be screened for this virus. You are considered at high risk for hepatitis B if:  You were born in a country where hepatitis B is common. Ask your health care provider which countries are considered high risk.  Your parents were born in a high-risk country, and you have not been immunized against hepatitis B (hepatitis B vaccine).  You have HIV or AIDS.  You use needles to inject street drugs.  You live with someone who has hepatitis B.  You have had sex with someone who has hepatitis B.  You get hemodialysis  treatment.  You take certain medicines for conditions, including cancer, organ transplantation, and autoimmune conditions. Hepatitis C  Blood testing is recommended for:  Everyone born from [birth year] through 1967.  Anyone with known risk factors for hepatitis C. Sexually transmitted infections (STIs)  You should be screened for sexually transmitted infections (STIs) including gonorrhea and chlamydia if:  You are sexually active and are younger than 42 years of age.  You are older than 42 years of age and your health care provider tells you that you are at risk for this type of infection.  Your sexual activity has changed since you were last screened and you are at an increased risk for chlamydia or gonorrhea. Ask your health care provider if you are at risk.  If you do not have HIV, but are at risk, it may be recommended that you take a prescription medicine daily to prevent HIV infection. This is called pre-exposure prophylaxis (PrEP). You are considered at risk if:  You are sexually active and do not regularly use condoms or know the HIV status of your partner(s).  You take drugs by injection.  You are sexually active with a partner who has HIV. Talk with your health care provider about whether you are at high risk of being infected with HIV. If you choose to begin PrEP, you should first be tested for HIV. You should then be tested every 3 months for as long as you are taking PrEP.  PREGNANCY   If you are premenopausal and you may become pregnant, ask your health care provider about preconception counseling.  If you may become pregnant, take 400 to 800 micrograms (mcg) of folic acid every day.  If you want to prevent pregnancy, talk to your health care provider about birth control (contraception). OSTEOPOROSIS AND MENOPAUSE   Osteoporosis is a disease in which the bones lose minerals and strength with aging. This can result in serious bone fractures. Your risk for osteoporosis can  be identified using a bone density scan.  If you are 91 years of age or older, or if you are at risk for osteoporosis and fractures, ask your health care provider if you should be screened.  Ask your health care provider whether you should take a calcium or vitamin D supplement to lower your risk for osteoporosis.  Menopause may have certain physical symptoms and risks.  Hormone replacement therapy may reduce some of these symptoms and risks. Talk to your health care provider about whether hormone replacement therapy is right for you.  HOME CARE INSTRUCTIONS   Schedule regular health, dental, and eye exams.  Stay current with your immunizations.   Do not use any tobacco products including cigarettes, chewing tobacco, or electronic cigarettes.  If you are pregnant, do not drink alcohol.  If you are breastfeeding, limit how much and how often you drink alcohol.  Limit alcohol intake to no more than 1 drink per day for nonpregnant women. One drink equals 12 ounces of beer, 5 ounces of wine, or 1 ounces of hard liquor.  Do not use street drugs.  Do not share needles.  Ask your health care provider for help if you need support or information about quitting drugs.  Tell your health care provider if you often feel depressed.  Tell your health care provider if you have ever been abused or do not feel safe at home.   This information is not intended to replace advice given to you by your health care provider. Make sure you discuss any questions you have with your health care provider.   Document Released: 10/12/2010 Document Revised: 04/19/2014 Document Reviewed: 02/28/2013 Elsevier Interactive Patient Education [employer].

## 2015-09-30 NOTE — Progress Notes (Signed)
Patient ID: Amanda Paul, female   DOB: 09/14/1975, 40 y.o.   MRN: [id number]    By signing my name below, I, Essence Howell, attest that this documentation has been prepared under the direction and in the presence of Darlyne Russian, MD Electronically Signed: Ladene Artist, ED Scribe 09/30/2015 at 8:59 AM.  Chief Complaint:  Chief Complaint  Patient presents with  . Annual Exam    for insurance   HPI: Amanda Paul is a 40 y.o. female who reports to [facility] today for a wellness annual exam for insurance purposes. Pt currently takes Tramadol and Voltaren PRN for knee pain. She is followed by orthopedic Dr. Drema Dallas.   PCP: Maximino Greenland, MD; she suspects that she has had a tetanus within the past 6 years.  GYN Pt is currently sexually active but uses condoms. She is no longer on BC pills. States that she stopped BC pills last year when she had a uterine polyp removed. She is followed by her gynecologist Delsa Bern, MD. Pt has not had a pap smear this year but plans schedule one soon. Pt reports h/o fibroids the size of a small orange and a lime without complications.   Weight  Pt states that she is down ~80 lbs from her highest weight by modifying her diet. Her current weight is 299 lbs.  Family Hx Pt's mother has a h/o HTN. No other pertinent family hx.   Past Medical History  Diagnosis Date  . Allergy   . Arthritis   . Morbid obesity ([place])   . Hernia, umbilical    Past Surgical History  Procedure Laterality Date  . Wisdom tooth extraction    . Umbilical hernia repair    . Hernia repair    . Hysteroscopy w/d&c N/A 08/26/2014    Procedure: DILATATION AND CURETTAGE /HYSTEROSCOPY and cervical repair of cervical laceration ;  Surgeon: Delsa Bern, MD;  Location: [facility];  Service: Gynecology;  Laterality: N/A;   Social History   Social History  . Marital Status: Single    Spouse Name: N/A  . Number of Children: N/A  . Years of Education: N/A   Social History Main  Topics  . Smoking status: Never Smoker   . Smokeless tobacco: Never Used  . Alcohol Use: 0.6 oz/week    1 Cans of beer per week     Comment: rare  . Drug Use: No  . Sexual Activity: Not Currently   Other Topics Concern  . 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 Hypertension Mother    Allergies  Allergen Reactions  . Amoxicillin Hives and Itching    Swollen eyes and throat.  Marland Kitchen Penicillins Hives and Swelling   Prior to Admission medications   Medication Sig Start Date End Date Taking? Authorizing Provider  cetirizine (ZYRTEC) 10 MG tablet Take 10 mg by mouth daily.   Yes Historical Provider, MD  Cholecalciferol (VITAMIN D) 2000 UNITS CAPS Take 1 capsule by mouth daily.   Yes Historical Provider, MD  diclofenac (VOLTAREN) 75 MG EC tablet Take 75 mg by mouth 2 (two) times daily.   Yes Historical Provider, MD  traMADol (ULTRAM) 50 MG tablet Take by mouth every 6 (six) hours as needed.  Yes Historical Provider, MD  norethindrone-ethinyl estradiol-iron (MICROGESTIN FE,GILDESS FE,LOESTRIN FE) 1.5-30 MG-MCG tablet Take 1 tablet by mouth daily. Reported on 09/30/2015    Historical Provider, MD   ROS: The patient denies fevers, chills, night sweats, unintentional weight loss, chest pain, palpitations, wheezing, dyspnea on exertion, nausea, vomiting, abdominal pain, dysuria, hematuria, melena, numbness, weakness, or tingling.   All other systems have been reviewed and were otherwise negative with the exception of those mentioned in the HPI and as above.    PHYSICAL EXAM: Filed Vitals:   09/30/15 0829  BP: 156/108  Pulse: 80  Temp: 98.8 F (37.1 C)  Resp: 16   Body mass index is 49.76 kg/(m^2).  General: Alert, no acute distress. Morbidly obese female. HEENT:  Normocephalic, atraumatic, oropharynx patent. Thyroid not  enlarged  Eye: EOMI, [facility] Cardiovascular: Regular rate and rhythm, no rubs murmurs or gallops. No Carotid bruits, radial pulse intact. No pedal edema.  Respiratory: Clear to auscultation bilaterally. No wheezes, rales, or rhonchi. No cyanosis, no use of accessory musculature Abdominal: No organomegaly, abdomen is soft and non-tender, positive bowel sounds. Morbidly obese. Scar around umbilicus . Palpable mass above the pubic symphysis.  Musculoskeletal: Gait intact. No edema, tenderness Skin: No rashes. Neurologic: Facial musculature symmetric. Psychiatric: Patient acts appropriately throughout our interaction. Lymphatic: No cervical or submandibular lymphadenopathy  LABS: Results for orders placed or performed in visit on 09/30/15  POCT CBC  Result Value Ref Range   WBC 5.9 4.6 - 10.2 K/uL   Lymph, poc 2.0 0.6 - 3.4   POC LYMPH PERCENT 34.1 10 - 50 %L   MID (cbc) 0.4 0 - 0.9   POC MID % 6.0 0 - 12 %M   POC Granulocyte 3.5 2 - 6.9   Granulocyte percent 59.9 37 - 80 %G   RBC 4.36 4.04 - 5.48 M/uL   Hemoglobin 12.0 (A) 12.2 - 16.2 g/dL   HCT, POC 35.2 (A) 37.7 - 47.9 %   MCV 80.9 80 - 97 fL   MCH, POC 27.5 27 - 31.2 pg   MCHC 34.0 31.8 - 35.4 g/dL   RDW, POC 15.4 %   Platelet Count, POC 303 142 - 424 K/uL   MPV 6.8 0 - 99.8 fL  POCT glucose (manual entry)  Result Value Ref Range   POC Glucose 87 70 - 99 mg/dl  POCT glycosylated hemoglobin (Hb A1C)  Result Value Ref Range   Hemoglobin A1C 5.9   POCT urinalysis dipstick  Result Value Ref Range   Color, UA yellow yellow   Clarity, UA clear clear   Glucose, UA negative negative   Bilirubin, UA negative negative   Ketones, POC UA negative negative   Spec Grav, UA 1.020    Blood, UA negative negative   pH, UA 7.0    Protein Ur, POC negative negative   Urobilinogen, UA 0.2    Nitrite, UA Negative Negative   Leukocytes, UA Negative Negative  POCT urine pregnancy  Result Value Ref Range   Preg Test, Ur Negative Negative    POCT Microscopic Urinalysis ([facility])  Result Value Ref Range   WBC,UR,HPF,POC None None WBC/hpf   RBC,UR,HPF,POC None None RBC/hpf   Bacteria Few (A) None, Too numerous to count   Mucus Absent Absent   Epithelial Cells, UR Per Microscopy Few (A) None, Too numerous to count cells/hpf   EKG/XRAY:   Primary read interpreted [facility] Farrier [facility]  ASSESSMENT/PLAN: Hemoglobin A1c is 5.9. She was given information regarding weight  loss. She has regular GYN follow-ups and is due to be seen later this summer by her gynecologist Dr. Cletis Media. She also is followed regularly with Dr. Tye Savoy. She came in here to get her form completed. I did mention possibility of gastric bypass surgery and she is not interested in this.I personally performed the services described in this documentation, which was scribed in my presence. The recorded information has been reviewed and is accurate.    Gross sideeffects, risk and benefits, and alternatives of medications d/w patient. Patient is aware that all medications have potential sideeffects and we are unable to predict every sideeffect or drug-drug interaction that may occur.  Arlyss Queen MD 09/30/2015 8:41 AM

## 2015-10-01 ENCOUNTER — Telehealth: Payer: Self-pay | Admitting: Emergency Medicine

## 2015-10-01 NOTE — Telephone Encounter (Signed)
LVM for lab result

## 2015-10-01 NOTE — Telephone Encounter (Signed)
-----   Message from Darlyne Russian, MD sent at 10/01/2015  7:44 AM EDT ----- Cholesterol is elevated LDL is elevated she needs to work on a low-fat diet as well as continue her work on weight loss , and exercise.

## 2015-10-02 ENCOUNTER — Ambulatory Visit
Admission: RE | Admit: 2015-10-02 | Discharge: 2015-10-02 | Disposition: A | Discharge status: Home / Self Care | Payer: Managed Care, Other (non HMO) | Source: Ambulatory Visit | Attending: Obstetrics and Gynecology | Admitting: Obstetrics and Gynecology

## 2015-10-02 DIAGNOSIS — Z1231 Encounter for screening mammogram for malignant neoplasm of breast: Secondary | ICD-10-CM

## 2015-10-07 ENCOUNTER — Other Ambulatory Visit: Payer: Self-pay | Admitting: Obstetrics and Gynecology

## 2015-10-07 DIAGNOSIS — R928 Other abnormal and inconclusive findings on diagnostic imaging of breast: Secondary | ICD-10-CM

## 2015-10-08 ENCOUNTER — Other Ambulatory Visit: Payer: Self-pay | Admitting: Internal Medicine

## 2015-10-08 DIAGNOSIS — R928 Other abnormal and inconclusive findings on diagnostic imaging of breast: Secondary | ICD-10-CM

## 2015-10-09 ENCOUNTER — Other Ambulatory Visit: Payer: Managed Care, Other (non HMO)

## 2015-10-21 ENCOUNTER — Ambulatory Visit
Admission: RE | Admit: 2015-10-21 | Discharge: 2015-10-21 | Disposition: A | Discharge status: Home / Self Care | Payer: Managed Care, Other (non HMO) | Source: Ambulatory Visit | Attending: Internal Medicine | Admitting: Internal Medicine

## 2015-10-21 DIAGNOSIS — R928 Other abnormal and inconclusive findings on diagnostic imaging of breast: Secondary | ICD-10-CM

## 2015-11-04 ENCOUNTER — Telehealth: Payer: Self-pay

## 2015-11-04 NOTE — Telephone Encounter (Signed)
Patient is calling because he signed a release for her labs to be faxed to [facility]. I spoke to MR and the fax was sent on 7/21. Patient states that the company did not receive anything. Please resend!  Fax: [phone]

## 2015-11-14 NOTE — Telephone Encounter (Signed)
Records were faxed again on 11/14/15, also patient left message on FMLA voicemail stating we were suppose to fax a letter to her insurance company, I know nothing about this and there are not letters in the system expect for old ones. So im not sure what she is speaking of. She stated she may come into clinic to get this note so if she does can someone get some more details and let me know. thanks

## 2016-01-05 ENCOUNTER — Telehealth: Payer: Self-pay

## 2016-01-05 NOTE — Telephone Encounter (Signed)
Patient called FMLA/Disability voicemail on 01/05/16 at 12pm stating that we were suppose to fax some letter for her about her CPE that she had in June 2017. There was a previous phone message in about this and I stated before there is no letter that has been written about this, and no one in clinical seems to know about it either. So I have printed off the patients OV notes and labs from the visit and will mail them to her like she stated in her voicemail.

## 2016-02-09 ENCOUNTER — Telehealth: Payer: Self-pay | Admitting: *Deleted

## 2016-02-09 NOTE — Telephone Encounter (Signed)
Formed filled out and is at clinical TL desk.   Please get patients waist circumference when she gets here to put on form.   Tried to call, no answer/no vm

## 2016-02-10 ENCOUNTER — Telehealth: Payer: Self-pay | Admitting: Family Medicine

## 2016-02-10 NOTE — Telephone Encounter (Signed)
lmom for patient to pick labs up letter is up front in red box labs was sent off was returned with wrong address at the time it was mail

## 2016-03-16 ENCOUNTER — Encounter: Payer: Self-pay | Admitting: Urgent Care

## 2016-03-16 ENCOUNTER — Ambulatory Visit (INDEPENDENT_AMBULATORY_CARE_PROVIDER_SITE_OTHER): Payer: Managed Care, Other (non HMO) | Admitting: Urgent Care

## 2016-03-16 VITALS — BP 166/103 | HR 73 | Temp 98.6°F | Resp 17 | Ht 65.0 in | Wt 293.0 lb

## 2016-03-16 DIAGNOSIS — Z8679 Personal history of other diseases of the circulatory system: Secondary | ICD-10-CM | POA: Diagnosis not present

## 2016-03-16 DIAGNOSIS — R030 Elevated blood-pressure reading, without diagnosis of hypertension: Secondary | ICD-10-CM | POA: Diagnosis not present

## 2016-03-16 DIAGNOSIS — J3489 Other specified disorders of nose and nasal sinuses: Secondary | ICD-10-CM

## 2016-03-16 DIAGNOSIS — R490 Dysphonia: Secondary | ICD-10-CM | POA: Diagnosis not present

## 2016-03-16 NOTE — Patient Instructions (Signed)
     IF you received an x-ray today, you will receive an invoice from [facility]. Please contact [facility] at [phone] with questions or concerns regarding your invoice.   IF you received labwork today, you will receive an invoice from [facility]. Please contact [facility] at [phone] with questions or concerns regarding your invoice.   Our billing staff will not be able to assist you with questions regarding bills from these companies.  You will be contacted with the lab results as soon as they are available. The fastest way to get your results is to activate your My Chart account. Instructions are located on the last page of this paperwork. If you have not heard from us regarding the results in 2 weeks, please contact this office.      

## 2016-03-16 NOTE — Progress Notes (Signed)
    MRN: [id number] DOB: 03/14/1976  Subjective:   Amanda Paul is a 40 y.o. female presenting for chief complaint of Laryngitis (onset today) and Sinusitis (Onset 3 days/ tylenol sinus)  Reports 3 day history of scratchy throat that progressed to hoarseness, losing her voice today. Has also had sinus pressure, ear fullness. Has tried otc decongestant, Alka-seltzer Cold/FluShe with minimal relief. She had previously used promethazine DM which did not help for a previous episode of voice hoarseness. Denies fever, sore throat, sinus pain, ear pain, cough, chest pain, shob, wheezing, n/v, abdominal pain, body aches, rash. Patient feels okay overall but is worried that she may have an infection. Denies smoking cigarettes. Denies history of asthma. Admits history of allergies, managed with Zyrtec but does not take this consistently.  HTN - Reports history of HTN, previously managed by Dr. Baird Cancer. She used to be on a blood pressure medication but stopped this per her PCP's recommendations. Denies ROS as above.  Amanda Paul has a current medication list which includes the following prescription(s): cetirizine, vitamin d, diclofenac, and tramadol. Also is allergic to amoxicillin and penicillins.  Amanda Paul  has a past medical history of Allergy; Arthritis; Hernia, umbilical; and Morbid obesity ([place]). Also  has a past surgical history that includes Wisdom tooth extraction; Umbilical hernia repair; Hernia repair; and Hysteroscopy w/D&C (N/A, 08/26/2014).   Objective:   Vitals: BP (!) 166/103 (BP Location: Right Arm, Patient Position: Sitting, Cuff Size: Large)   Pulse 73   Temp 98.6 F (37 C) (Oral)   Resp 17   Ht 5\' 5"  (1.651 m)   Wt 293 lb (132.9 kg)   LMP 02/29/2016   SpO2 100%   BMI 48.76 kg/m   BP Readings from Last 3 Encounters:  03/16/16 (!) 166/103  09/30/15 132/88  08/26/14 (!) 172/85    Physical Exam  Constitutional: She is oriented to person, place, and time. She appears  well-developed and well-nourished.  HENT:  TM's intact bilaterally, no effusions or erythema. Nasal turbinates pink and moist, nasal passages patent. No sinus tenderness. Oropharynx with moderate post-nasal drainage, mucous membranes moist, dentition in good repair.  Eyes: Right eye exhibits no discharge. Left eye exhibits no discharge. No scleral icterus.  Neck: Normal range of motion. Neck supple.  Cardiovascular: Normal rate, regular rhythm and intact distal pulses.  Exam reveals no gallop and no friction rub.   No murmur heard. Pulmonary/Chest: No respiratory distress. She has no wheezes. She has no rales.  Lymphadenopathy:    She has no cervical adenopathy.  Neurological: She is alert and oriented to person, place, and time.   Assessment and Plan :   1. Hoarseness of voice 2. Sinus pressure - Likely undergoing viral upper respiratory infection, laryngitis. Advised supportive care, restart Zyrtec, hydrate well, voice rest. Patient is to rtc 03/22/2016 if her symptoms persist.  3. Elevated blood pressure reading 4. History of hypertension - Patient is to avoid decongestants, she will rtc tomorrow for her BP recheck.   Jaynee Eagles, PA-C [facility] [phone] 03/16/2016 6:14 PM

## 2016-05-14 ENCOUNTER — Other Ambulatory Visit: Payer: Self-pay | Admitting: Family Medicine

## 2016-05-16 NOTE — Telephone Encounter (Signed)
Last ov for cough

## 2016-05-17 NOTE — Telephone Encounter (Signed)
Based off of my note from 03/16/2016, this cough is a new symptom. I will provide refill but patient should come in for an office visit if her cough is really bothering her.

## 2016-11-25 ENCOUNTER — Other Ambulatory Visit: Payer: Self-pay | Admitting: Obstetrics and Gynecology

## 2016-11-25 DIAGNOSIS — Z1231 Encounter for screening mammogram for malignant neoplasm of breast: Secondary | ICD-10-CM

## 2016-12-09 ENCOUNTER — Ambulatory Visit: Payer: Managed Care, Other (non HMO)

## 2016-12-16 ENCOUNTER — Ambulatory Visit
Admission: RE | Admit: 2016-12-16 | Discharge: 2016-12-16 | Disposition: A | Discharge status: Home / Self Care | Payer: Managed Care, Other (non HMO) | Source: Ambulatory Visit | Attending: Obstetrics and Gynecology | Admitting: Obstetrics and Gynecology

## 2016-12-16 DIAGNOSIS — Z1231 Encounter for screening mammogram for malignant neoplasm of breast: Secondary | ICD-10-CM

## 2017-10-20 ENCOUNTER — Ambulatory Visit: Payer: Managed Care, Other (non HMO) | Admitting: Allergy & Immunology

## 2017-12-02 ENCOUNTER — Other Ambulatory Visit: Payer: Self-pay | Admitting: Obstetrics and Gynecology

## 2017-12-02 DIAGNOSIS — Z1231 Encounter for screening mammogram for malignant neoplasm of breast: Secondary | ICD-10-CM

## 2017-12-28 ENCOUNTER — Ambulatory Visit: Payer: Managed Care, Other (non HMO)

## 2018-01-23 ENCOUNTER — Ambulatory Visit
Admission: RE | Admit: 2018-01-23 | Discharge: 2018-01-23 | Disposition: A | Discharge status: Home / Self Care | Payer: 59 | Source: Ambulatory Visit | Attending: Obstetrics and Gynecology | Admitting: Obstetrics and Gynecology

## 2018-01-23 DIAGNOSIS — Z1231 Encounter for screening mammogram for malignant neoplasm of breast: Secondary | ICD-10-CM

## 2018-02-22 ENCOUNTER — Encounter (INDEPENDENT_AMBULATORY_CARE_PROVIDER_SITE_OTHER): Payer: 59 | Admitting: Ophthalmology

## 2018-02-22 DIAGNOSIS — H43813 Vitreous degeneration, bilateral: Secondary | ICD-10-CM | POA: Diagnosis not present

## 2018-02-22 DIAGNOSIS — H2511 Age-related nuclear cataract, right eye: Secondary | ICD-10-CM | POA: Diagnosis not present

## 2018-02-22 DIAGNOSIS — H3561 Retinal hemorrhage, right eye: Secondary | ICD-10-CM | POA: Diagnosis not present

## 2018-05-03 ENCOUNTER — Encounter (INDEPENDENT_AMBULATORY_CARE_PROVIDER_SITE_OTHER): Payer: 59 | Admitting: Ophthalmology

## 2018-12-11 ENCOUNTER — Other Ambulatory Visit: Payer: Self-pay | Admitting: Obstetrics and Gynecology

## 2018-12-11 DIAGNOSIS — Z1231 Encounter for screening mammogram for malignant neoplasm of breast: Secondary | ICD-10-CM

## 2019-01-15 ENCOUNTER — Encounter: Payer: Self-pay | Admitting: Pulmonary Disease

## 2019-01-15 ENCOUNTER — Other Ambulatory Visit: Payer: Self-pay

## 2019-01-15 ENCOUNTER — Ambulatory Visit (INDEPENDENT_AMBULATORY_CARE_PROVIDER_SITE_OTHER): Payer: 59 | Admitting: Pulmonary Disease

## 2019-01-15 DIAGNOSIS — I10 Essential (primary) hypertension: Secondary | ICD-10-CM | POA: Diagnosis not present

## 2019-01-15 DIAGNOSIS — J986 Disorders of diaphragm: Secondary | ICD-10-CM

## 2019-01-15 NOTE — Progress Notes (Signed)
Subjective:     Patient ID: Amanda Paul, female   DOB: August 01, 1975, 43 y.o.   MRN: [id number]  HPI   Chief Complaint  Patient presents with  . Consult    Referred by [facility]   43 year old morbidly obese woman who works at [employer] at [employer] presents for evaluation of normal chest x-ray. She undergoes an annual physical at [facility].  This year for some reason they put her through a chest x-ray and EKG also although she claims that she was asymptomatic.  She denies shortness of breath, wheezing or cough.  She puts in a lot of steps during her 12-hour workday and her days off she can walk for about 30 to 45 minutes at her pace.  She reports seasonal allergies which manifests as itchy eyes for which she takes Best boy and Zyrtec with good relief. Chest x-ray was obtained which shows chronic elevation of the right hemidiaphragm and a right basilar infiltrate otherwise clear lungs.  On review of her prior films there was a CT abdomen scout film from 01/2013 which also seems to indicate an elevated right hemidiaphragm  She also denies any sleep-related symptoms and feels that she sleeps soundly.  No bed partner history is available.  There is no history of snoring or witnessed apneas. Epworth sleepiness score is 5. Bedtime is between 11 PM and midnight, she sleeps on her right side with 2 pillows, reports minimal sleep latency, no interruptions or nocturnal awakenings or nocturia, sleeps around 5 to 7 hours and is out of bed anywhere from 430 to 5:30 AM on workdays.  On her days off she will stay in bed up to about 6 AM, denies dryness of mouth or morning headaches There is no history suggestive of cataplexy, sleep paralysis or parasomnias      Past Medical History:  Diagnosis Date  . Allergy   . Arthritis   . Hernia, umbilical   . Morbid obesity ([place])     Past Surgical History:  Procedure Laterality Date  . HERNIA REPAIR    . HYSTEROSCOPY W/D&C N/A  08/26/2014   Procedure: DILATATION AND CURETTAGE /HYSTEROSCOPY and cervical repair of cervical laceration ;  Surgeon: Delsa Bern, MD;  Location: [facility];  Service: Gynecology;  Laterality: N/A;  . UMBILICAL HERNIA REPAIR    . WISDOM TOOTH EXTRACTION      Allergies  Allergen Reactions  . Amoxicillin Hives and Itching    Swollen eyes and throat.  Marland Kitchen Penicillins Hives and Swelling    Social History   Socioeconomic History  . Marital status: Single    Spouse name: Not on file  . Number of children: Not on file  . Years of education: Not on file  . Highest education level: Not on file  Occupational History  . Not on file  Social Needs  . Financial resource strain: Not on file  . Food insecurity    Worry: Not on file    Inability: Not on file  . Transportation needs    Medical: Not on file    Non-medical: Not on file  Tobacco Use  . Smoking status: Never Smoker  . Smokeless tobacco: Never Used  Substance and Sexual Activity  . Alcohol use: Yes    Alcohol/week: 1.0 standard drinks    Types: 1 Cans of beer per week    Comment: rare  . Drug use: No  . Sexual activity: Not Currently  Lifestyle  . Physical activity  Days per week: Not on file    Minutes per session: Not on file  . Stress: Not on file  Relationships  . Social Herbalist on phone: Not on file    Gets together: Not on file    Attends religious service: Not on file    Active member of club or organization: Not on file    Attends meetings of clubs or organizations: Not on file    Relationship status: Not on file  . Intimate partner violence    Fear of current or ex partner: Not on file    Emotionally abused: Not on file    Physically abused: Not on file    Forced sexual activity: Not on file  Other Topics Concern  . 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 Hypertension Mother       Review of Systems  Constitutional: Negative.   HENT: Negative.   Eyes: Negative.   Respiratory: Negative.   Cardiovascular: Negative.   Gastrointestinal: Negative.   Endocrine: Negative.   Genitourinary: Negative.   Musculoskeletal: Negative.   Skin: Negative.   Allergic/Immunologic: Negative.   Neurological: Negative.   Hematological: Negative.   Psychiatric/Behavioral: Negative.        Objective:   Physical Exam  Gen. Pleasant, obese, in no distress, normal affect ENT - no pallor,icterus, no post nasal drip, class 2-3 airway Neck: No JVD, no thyromegaly, no carotid bruits Lungs: no use of accessory muscles, no dullness to percussion, decreased without rales or rhonchi  Cardiovascular: Rhythm regular, heart sounds  normal, no murmurs or gallops, no peripheral edema Abdomen: soft and non-tender, no hepatosplenomegaly, BS normal. Musculoskeletal: No deformities, no cyanosis or clubbing Neuro:  alert, non focal, no tremors

## 2019-01-15 NOTE — Patient Instructions (Signed)
You have an elevated right-sided diaphragm No further testing is required  Call us if you develop shortness of breath or excessive daytime fatigue or sleepiness and we can do further testing

## 2019-01-15 NOTE — Assessment & Plan Note (Signed)
At risk for OSA but she denies daytime sleepiness and fatigue Does not want to pursue further sleep testing  The pathophysiology of obstructive sleep apnea , it's cardiovascular consequences & modes of treatment including CPAP were discused with the patient in detail & they evidenced understanding.

## 2019-01-15 NOTE — Assessment & Plan Note (Signed)
Hypertensive at PCP and again at her office today. She will follow-up with her PCP

## 2019-01-15 NOTE — Assessment & Plan Note (Signed)
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

## 2019-01-19 DIAGNOSIS — M79641 Pain in right hand: Secondary | ICD-10-CM | POA: Insufficient documentation

## 2019-01-19 HISTORY — DX: Pain in right hand: M79.641

## 2019-01-29 ENCOUNTER — Other Ambulatory Visit: Payer: Self-pay

## 2019-01-29 ENCOUNTER — Ambulatory Visit
Admission: RE | Admit: 2019-01-29 | Discharge: 2019-01-29 | Disposition: A | Discharge status: Home / Self Care | Payer: 59 | Source: Ambulatory Visit | Attending: Obstetrics and Gynecology | Admitting: Obstetrics and Gynecology

## 2019-01-29 DIAGNOSIS — Z1231 Encounter for screening mammogram for malignant neoplasm of breast: Secondary | ICD-10-CM

## 2019-07-31 ENCOUNTER — Ambulatory Visit (INDEPENDENT_AMBULATORY_CARE_PROVIDER_SITE_OTHER): Payer: 59 | Admitting: Podiatry

## 2019-07-31 ENCOUNTER — Encounter: Payer: Self-pay | Admitting: Podiatry

## 2019-07-31 ENCOUNTER — Ambulatory Visit (INDEPENDENT_AMBULATORY_CARE_PROVIDER_SITE_OTHER): Payer: 59

## 2019-07-31 ENCOUNTER — Other Ambulatory Visit: Payer: Self-pay

## 2019-07-31 ENCOUNTER — Other Ambulatory Visit: Payer: Self-pay | Admitting: Podiatry

## 2019-07-31 DIAGNOSIS — M778 Other enthesopathies, not elsewhere classified: Secondary | ICD-10-CM | POA: Diagnosis not present

## 2019-07-31 DIAGNOSIS — Z8742 Personal history of other diseases of the female genital tract: Secondary | ICD-10-CM | POA: Insufficient documentation

## 2019-07-31 DIAGNOSIS — M722 Plantar fascial fibromatosis: Secondary | ICD-10-CM

## 2019-07-31 DIAGNOSIS — R87810 Cervical high risk human papillomavirus (HPV) DNA test positive: Secondary | ICD-10-CM | POA: Insufficient documentation

## 2019-07-31 MED ORDER — MELOXICAM 15 MG PO TABS: 15.0000 mg | ORAL_TABLET | Freq: Every day | ORAL | 3 refills | Status: DC

## 2019-07-31 MED ORDER — METHYLPREDNISOLONE 4 MG PO TBPK: ORAL_TABLET | ORAL | 0 refills | Status: DC

## 2019-07-31 NOTE — Progress Notes (Signed)
Subjective:  Patient ID: Amanda Paul, female    DOB: 21-Dec-1975,  MRN: [id number] HPI Chief Complaint  Patient presents with  . Foot Pain    Dorsal midfoot and lateral ankle left - aching, swelling x few months, treated years ago for bone spurs in the ankle, worse some new shoes and thinks it made it flare, tried stretching, doesn't have good ROM in ankle  . New Patient (Initial Visit)    44 y.o. female presents with the above complaint.   ROS: Denies fever chills nausea vomiting muscle aches pains calf pain back pain chest pain shortness of breath.  Past Medical History:  Diagnosis Date  . Allergy   . Arthritis   . Hernia, umbilical   . Morbid obesity ([place])    Past Surgical History:  Procedure Laterality Date  . HERNIA REPAIR    . HYSTEROSCOPY WITH D & C N/A 08/26/2014   Procedure: DILATATION AND CURETTAGE /HYSTEROSCOPY and cervical repair of cervical laceration ;  Surgeon: Delsa Bern, MD;  Location: [facility];  Service: Gynecology;  Laterality: N/A;  . UMBILICAL HERNIA REPAIR    . WISDOM TOOTH EXTRACTION      Current Outpatient Medications:  .  Biotin w/ Vitamins C & E (HAIR/SKIN/NAILS PO), Take by mouth., Disp: , Rfl:  .  Calcium Carb-Cholecalciferol (CALCIUM 500 +D PO), Take by mouth., Disp: , Rfl:  .  magnesium oxide (MAG-OX) 400 MG tablet, Take 400 mg by mouth daily., Disp: , Rfl:  .  Multiple Vitamin (MULTIVITAMIN) capsule, Take 1 capsule by mouth daily., Disp: , Rfl:  .  Naproxen Sodium (ALEVE PO), Take by mouth., Disp: , Rfl:  .  Zinc Acetate, Oral, (ZINC ACETATE PO), Take by mouth., Disp: , Rfl:  .  Cholecalciferol (VITAMIN D) 2000 UNITS CAPS, Take 1 capsule by mouth daily., Disp: , Rfl:  .  diclofenac (VOLTAREN) 75 MG EC tablet, Take 75 mg by mouth 2 (two) times daily., Disp: , Rfl:  .  meloxicam (MOBIC) 15 MG tablet, Take 1 tablet (15 mg total) by mouth daily., Disp: 30 tablet, Rfl: 3 .  methylPREDNISolone (MEDROL DOSEPAK) 4 MG TBPK tablet, 6 day dose  pack - take as directed, Disp: 21 tablet, Rfl: 0  Allergies  Allergen Reactions  . Amoxicillin Hives and Itching    Swollen eyes and throat.  Marland Kitchen Penicillins Hives and Swelling   Review of Systems Objective:  There were no vitals filed for this visit.  General: Well developed, nourished, in no acute distress, alert and oriented x3   Dermatological: Skin is warm, dry and supple bilateral. Nails x 10 are well maintained; remaining integument appears unremarkable at this time. There are no open sores, no preulcerative lesions, no rash or signs of infection present.  Vascular: Dorsalis Pedis artery and Posterior Tibial artery pedal pulses are 2/4 bilateral with immedate capillary fill time. Pedal hair growth present. No varicosities and no lower extremity edema present bilateral.   Neruologic: Grossly intact via light touch bilateral. Vibratory intact via tuning fork bilateral. Protective threshold with Semmes Wienstein monofilament intact to all pedal sites bilateral. Patellar and Achilles deep tendon reflexes 2+ bilateral. No Babinski or clonus noted bilateral.   Musculoskeletal: No gross boney pedal deformities bilateral. No pain, crepitus, or limitation noted with foot and ankle range of motion bilateral. Muscular strength 5/5 in all groups tested bilateral.  Pain on palpation dorsal midfoot primarily left.  Gait: Unassisted, Nonantalgic.    Radiographs:  Radiographs taken today demonstrate dorsal spurring  across the talonavicular joint and the anterior ankle as well as the navicular bone at the attachment of the posterior tibial tendon there appears to be a cyst in this area as well.  No fractures are identified.  Mild pes planus is noted.  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    Plan: Injected the area today with 20 mg Kenalog 5 mg Marcaine point of maximal tenderness dorsal aspect of the foot just proximal to the  Lisfranc's joints.  Did discuss the possible need for CT scan for surgical evaluation and treatment.     Dantavious Snowball T. [place], [place]

## 2019-09-18 ENCOUNTER — Ambulatory Visit: Payer: 59 | Admitting: Podiatry

## 2020-01-03 ENCOUNTER — Other Ambulatory Visit: Payer: Self-pay | Admitting: Obstetrics and Gynecology

## 2020-01-03 DIAGNOSIS — Z1231 Encounter for screening mammogram for malignant neoplasm of breast: Secondary | ICD-10-CM

## 2020-01-21 ENCOUNTER — Encounter: Payer: 59 | Admitting: Obstetrics & Gynecology

## 2020-01-30 ENCOUNTER — Other Ambulatory Visit: Payer: Self-pay

## 2020-01-30 ENCOUNTER — Ambulatory Visit
Admission: RE | Admit: 2020-01-30 | Discharge: 2020-01-30 | Disposition: A | Discharge status: Home / Self Care | Payer: No Typology Code available for payment source | Source: Ambulatory Visit | Attending: Obstetrics and Gynecology | Admitting: Obstetrics and Gynecology

## 2020-01-30 DIAGNOSIS — Z1231 Encounter for screening mammogram for malignant neoplasm of breast: Secondary | ICD-10-CM

## 2020-05-29 ENCOUNTER — Other Ambulatory Visit: Payer: Self-pay | Admitting: Gastroenterology

## 2020-06-06 NOTE — Progress Notes (Signed)
Attempted to obtain medical history via telephone, unable to reach at this time. I left a voicemail to return pre surgical testing department's phone call.  

## 2020-06-10 NOTE — Progress Notes (Signed)
Attempted to obtain medical history via telephone, unable to reach at this time. I left a voicemail to return pre surgical testing department's phone call.  

## 2020-06-11 ENCOUNTER — Other Ambulatory Visit (HOSPITAL_COMMUNITY)
Admission: RE | Admit: 2020-06-11 | Discharge: 2020-06-11 | Disposition: A | Discharge status: Home / Self Care | Payer: No Typology Code available for payment source | Source: Ambulatory Visit | Attending: Gastroenterology | Admitting: Gastroenterology

## 2020-06-11 DIAGNOSIS — Z20822 Contact with and (suspected) exposure to covid-19: Secondary | ICD-10-CM | POA: Insufficient documentation

## 2020-06-11 DIAGNOSIS — Z01812 Encounter for preprocedural laboratory examination: Secondary | ICD-10-CM | POA: Insufficient documentation

## 2020-06-11 LAB — SARS CORONAVIRUS 2 (TAT 6-24 HRS): SARS Coronavirus 2: NEGATIVE

## 2020-06-12 ENCOUNTER — Other Ambulatory Visit: Payer: Self-pay

## 2020-06-13 ENCOUNTER — Ambulatory Visit (HOSPITAL_COMMUNITY)
Admission: RE | Admit: 2020-06-13 | Discharge: 2020-06-13 | Disposition: A | Discharge status: Home / Self Care | Payer: No Typology Code available for payment source | Attending: Gastroenterology | Admitting: Gastroenterology

## 2020-06-13 ENCOUNTER — Ambulatory Visit (HOSPITAL_COMMUNITY): Payer: No Typology Code available for payment source | Admitting: Certified Registered Nurse Anesthetist

## 2020-06-13 ENCOUNTER — Encounter (HOSPITAL_COMMUNITY): Payer: Self-pay | Admitting: Gastroenterology

## 2020-06-13 ENCOUNTER — Encounter (HOSPITAL_COMMUNITY)
Admission: RE | Disposition: A | Discharge status: Home / Self Care | Payer: Self-pay | Source: Home / Self Care | Attending: Gastroenterology

## 2020-06-13 DIAGNOSIS — Z1211 Encounter for screening for malignant neoplasm of colon: Secondary | ICD-10-CM | POA: Insufficient documentation

## 2020-06-13 DIAGNOSIS — K5730 Diverticulosis of large intestine without perforation or abscess without bleeding: Secondary | ICD-10-CM | POA: Diagnosis not present

## 2020-06-13 DIAGNOSIS — Z880 Allergy status to penicillin: Secondary | ICD-10-CM | POA: Diagnosis not present

## 2020-06-13 HISTORY — PX: COLONOSCOPY WITH PROPOFOL: SHX5780

## 2020-06-13 HISTORY — PX: POLYPECTOMY: SHX5525

## 2020-06-13 HISTORY — PX: HEMOSTASIS CLIP PLACEMENT: SHX6857

## 2020-06-13 LAB — PREGNANCY, URINE: Preg Test, Ur: NEGATIVE

## 2020-06-13 SURGERY — COLONOSCOPY WITH PROPOFOL
Anesthesia: Monitor Anesthesia Care

## 2020-06-13 MED ORDER — GLYCOPYRROLATE 0.2 MG/ML IJ SOLN
INTRAMUSCULAR | Status: DC | PRN
  Administered 2020-06-13: .1 mg via INTRAVENOUS

## 2020-06-13 MED ORDER — LIDOCAINE 2% (20 MG/ML) 5 ML SYRINGE
INTRAMUSCULAR | Status: DC | PRN
  Administered 2020-06-13: 60 mg via INTRAVENOUS

## 2020-06-13 MED ORDER — SODIUM CHLORIDE 0.9 % IV SOLN: INTRAVENOUS | Status: DC

## 2020-06-13 MED ORDER — LACTATED RINGERS IV SOLN: INTRAVENOUS | Status: DC

## 2020-06-13 MED ORDER — PROPOFOL 10 MG/ML IV BOLUS
INTRAVENOUS | Status: DC | PRN
  Administered 2020-06-13: 20 mg via INTRAVENOUS
  Administered 2020-06-13: 100 ug/kg/min via INTRAVENOUS

## 2020-06-13 SURGICAL SUPPLY — 22 items

## 2020-06-13 NOTE — H&P (Signed)
  Amanda Paul   HPI: This 45 year old black female presents to the office for colorectal cancer screening. She has 1 BM per day with no obvious blood or mucus in the stool. She has a good appetite and her weight has been stable. She denies having any complaints of abdominal pain, nausea, vomiting, acid reflux, dysphagia or odynophagia. She denies having a family history of colon cancer, celiac sprue or IBD.    Past Medical History:  Diagnosis Date  . Allergy   . Arthritis   . Hernia, umbilical   . Morbid obesity ([place])     Past Surgical History:  Procedure Laterality Date  . HERNIA REPAIR    . HYSTEROSCOPY WITH D & C N/A 08/26/2014   Procedure: DILATATION AND CURETTAGE /HYSTEROSCOPY and cervical repair of cervical laceration ;  Surgeon: Delsa Bern, MD;  Location: [facility];  Service: Gynecology;  Laterality: N/A;  . UMBILICAL HERNIA REPAIR    . WISDOM TOOTH EXTRACTION      Family History  Problem Relation Age of Onset  . Hypertension Mother     Social History:  reports that she has never smoked. She has never used smokeless tobacco. She reports current alcohol use of about 1.0 standard drink of alcohol per week. She reports that she does not use drugs.  Allergies:  Allergies  Allergen Reactions  . Amoxicillin Hives, Itching and Swelling    Swollen eyes and throat.  Marland Kitchen Penicillins Hives and Swelling    Medications:  Scheduled:  Continuous: . lactated ringers 10 mL/hr at 06/13/20 1032    Results for orders placed or performed during the hospital encounter of 06/13/20 (from the past 24 hour(s))  Pregnancy, urine     Status: None   Collection Time: 06/13/20 10:07 AM  Result Value Ref Range   Preg Test, Ur NEGATIVE NEGATIVE     No results found.  ROS:  As stated above in the HPI otherwise negative.  Blood pressure (!) 172/102, temperature 98.1 F (36.7 C), temperature source Temporal, resp. rate 14, height 5\' 5"  (1.651 m), weight (!) 136.5 kg, last  menstrual period 05/21/2020, SpO2 100 %.    PE: Gen: NAD, Alert and Oriented HEENT:  [place]/AT, EOMI Neck: Supple, no LAD Lungs: CTA Bilaterally CV: RRR without M/G/R ABD: Soft, NTND, +BS, obese. Ext: No C/C/E  Assessment/Plan: 1) Screening colonoscopy  Belicia Difatta D 06/13/2020, 11:10 AM

## 2020-06-13 NOTE — Transfer of Care (Signed)
Immediate Anesthesia Transfer of Care Note  Patient: Amanda Paul  Procedure(s) Performed: COLONOSCOPY WITH PROPOFOL (N/A ) POLYPECTOMY HEMOSTASIS CLIP PLACEMENT  Patient Location: PACU  Anesthesia Type:MAC  Level of Consciousness: awake, alert , drowsy and patient cooperative  Airway & Oxygen Therapy: Patient Spontanous Breathing and Patient connected to nasal cannula oxygen  Post-op Assessment: Report given to RN and Post -op Vital signs reviewed and stable  Post vital signs: Reviewed and stable  Last Vitals:  Vitals Value Taken Time  BP 129/71 06/13/20 1202  Temp    Pulse    Resp 21 06/13/20 1203  SpO2    Vitals shown include unvalidated device data.  Last Pain:  Vitals:   06/13/20 1029  TempSrc: Temporal  PainSc: 0-No pain         Complications: No complications documented.

## 2020-06-13 NOTE — Discharge Instructions (Signed)

## 2020-06-13 NOTE — Anesthesia Preprocedure Evaluation (Addendum)
Anesthesia Evaluation  Patient identified by MRN, date of birth, ID band Patient awake    Reviewed: Allergy & Precautions, NPO status , Patient's Chart, lab work & pertinent test results  History of Anesthesia Complications Negative for: history of anesthetic complications  Airway Mallampati: II  TM Distance: >3 FB Neck ROM: Full    Dental  (+) Dental Advisory Given, Chipped,    Pulmonary neg pulmonary ROS,    Pulmonary exam normal        Cardiovascular hypertension (no meds), Normal cardiovascular exam     Neuro/Psych negative neurological ROS  negative psych ROS   GI/Hepatic negative GI ROS, Neg liver ROS,   Endo/Other  Morbid obesity  Renal/GU negative Renal ROS     Musculoskeletal  (+) Arthritis ,   Abdominal (+) + obese,   Peds  Hematology negative hematology ROS (+)   Anesthesia Other Findings Covid test negative   Reproductive/Obstetrics                            Anesthesia Physical Anesthesia Plan  ASA: III  Anesthesia Plan: MAC   Post-op Pain Management:    Induction: Intravenous  PONV Risk Score and Plan: 2 and Propofol infusion and Treatment may vary due to age or medical condition  Airway Management Planned: Natural Airway and Simple Face Mask  Additional Equipment: None  Intra-op Plan:   Post-operative Plan:   Informed Consent: I have reviewed the patients History and Physical, chart, labs and discussed the procedure including the risks, benefits and alternatives for the proposed anesthesia with the patient or authorized representative who has indicated his/her understanding and acceptance.       Plan Discussed with: CRNA and Anesthesiologist  Anesthesia Plan Comments:        Anesthesia Quick Evaluation

## 2020-06-13 NOTE — Anesthesia Procedure Notes (Addendum)
Procedure Name: MAC Date/Time: 06/13/2020 11:23 AM Performed by: West Pugh, CRNA Pre-anesthesia Checklist: Patient identified, Emergency Drugs available, Suction available, Patient being monitored and Timeout performed Patient Re-evaluated:Patient Re-evaluated prior to induction Oxygen Delivery Method: Simple face mask Preoxygenation: Pre-oxygenation with 100% oxygen Induction Type: IV induction Placement Confirmation: positive ETCO2 Dental Injury: Teeth and Oropharynx as per pre-operative assessment

## 2020-06-13 NOTE — Anesthesia Postprocedure Evaluation (Signed)
Anesthesia Post Note  Patient: Amanda Paul  Procedure(s) Performed: COLONOSCOPY WITH PROPOFOL (N/A ) POLYPECTOMY HEMOSTASIS CLIP PLACEMENT     Patient location during evaluation: PACU Anesthesia Type: MAC Level of consciousness: awake and alert Pain management: pain level controlled Vital Signs Assessment: post-procedure vital signs reviewed and stable Respiratory status: spontaneous breathing, nonlabored ventilation and respiratory function stable Cardiovascular status: stable and blood pressure returned to baseline Anesthetic complications: no   No complications documented.  Last Vitals:  Vitals:   06/13/20 1220 06/13/20 1225  BP: 131/76 134/90  Pulse: (!) 51 64  Resp: 15 19  Temp:    SpO2: 100% 100%    Last Pain:  Vitals:   06/13/20 1225  TempSrc:   PainSc: 0-No pain                 [name]

## 2020-06-13 NOTE — Op Note (Signed)
[facility] Patient Name: Amanda Paul Procedure Date: 06/13/2020 MRN: [id number] Attending MD: Carol Ada , MD Date of Birth: 07-25-1975 CSN: [id number] Age: 45 Admit Type: Outpatient Procedure:                Colonoscopy Indications:              Screening for colorectal malignant neoplasm Providers:                Carol Ada, MD, Nelia Shi, RN, Christell Faith, CRNA, Benetta Spar, Technician Referring MD:              Medicines:                Propofol per Anesthesia Complications:            No immediate complications. Estimated Blood Loss:     Estimated blood loss: none. Procedure:                Pre-Anesthesia Assessment:                           - Prior to the procedure, a History and Physical                            was performed, and patient medications and                            allergies were reviewed. The patient's tolerance of                            previous anesthesia was also reviewed. The risks                            and benefits of the procedure and the sedation                            options and risks were discussed with the patient.                            All questions were answered, and informed consent                            was obtained. Prior Anticoagulants: The patient has                            taken no previous anticoagulant or antiplatelet                            agents. ASA Grade Assessment: III - A patient with                            severe systemic disease. After reviewing the risks  and benefits, the patient was deemed in                            satisfactory condition to undergo the procedure.                           - Sedation was administered by an anesthesia                            professional. Deep sedation was attained.                           After obtaining informed consent, the colonoscope                             was passed under direct vision. Throughout the                            procedure, the patient's blood pressure, pulse, and                            oxygen saturations were monitored continuously. The                            CF-HQ190L ([id number] Olympus colonoscope was                            introduced through the anus and advanced to the the                            cecum, identified by appendiceal orifice and                            ileocecal valve. The colonoscopy was performed                            without difficulty. The patient tolerated the                            procedure well. The quality of the bowel                            preparation was excellent. The ileocecal valve,                            appendiceal orifice, and rectum were photographed. Scope In: 11:32:20 AM Scope Out: 11:56:10 AM Scope Withdrawal Time: 0 hours 19 minutes 47 seconds  Total Procedure Duration: 0 hours 23 minutes 50 seconds  Findings:      A 10 mm polyp was found in the cecum. The polyp was semi-sessile. The       polyp was removed with a hot snare. The polyp was removed with a       piecemeal technique using a hot snare. Resection and retrieval were       complete. To prevent bleeding post-intervention,  two hemostatic clips       were successfully placed (MR conditional). There was no bleeding at the       end of the procedure.      Two sessile polyps were found in the transverse colon. The polyps were 2       to 3 mm in size. These polyps were removed with a cold snare. Resection       and retrieval were complete.      Scattered small and large-mouthed diverticula were found in the sigmoid       colon. Impression:               - One 10 mm polyp in the cecum, removed with a hot                            snare and removed piecemeal using a hot snare.                            Resected and retrieved. Clips (MR conditional) were                            placed.                            - Two 2 to 3 mm polyps in the transverse colon,                            removed with a cold snare. Resected and retrieved.                           - Diverticulosis in the sigmoid colon. Moderate Sedation:      Not Applicable - Patient had care per Anesthesia. Recommendation:           - Patient has a contact number available for                            emergencies. The signs and symptoms of potential                            delayed complications were discussed with the                            patient. Return to normal activities tomorrow.                            Written discharge instructions were provided to the                            patient.                           - Resume previous diet.                           - Continue present medications.                           -  Await pathology results.                           - Repeat colonoscopy in 3 years for surveillance. Procedure Code(s):        --- Professional ---                           [phone], Colonoscopy, flexible; with removal of                            tumor(s), polyp(s), or other lesion(s) by snare                            technique Diagnosis Code(s):        --- Professional ---                           K63.5, Polyp of colon                           Z12.11, Encounter for screening for malignant                            neoplasm of colon                           K57.30, Diverticulosis of large intestine without                            perforation or abscess without bleeding CPT copyright 2019 [employer]. All rights reserved. The codes documented in this report are preliminary and upon coder review may  be revised to meet current compliance requirements. Carol Ada, MD Carol Ada, MD 06/13/2020 12:01:22 PM This report has been signed electronically. Number of Addenda: 0

## 2020-06-16 ENCOUNTER — Other Ambulatory Visit: Payer: Self-pay

## 2020-06-16 ENCOUNTER — Encounter: Payer: Self-pay | Admitting: Obstetrics & Gynecology

## 2020-06-16 ENCOUNTER — Other Ambulatory Visit (HOSPITAL_COMMUNITY)
Admission: RE | Admit: 2020-06-16 | Discharge: 2020-06-16 | Disposition: A | Discharge status: Home / Self Care | Payer: No Typology Code available for payment source | Source: Ambulatory Visit | Attending: Obstetrics & Gynecology | Admitting: Obstetrics & Gynecology

## 2020-06-16 ENCOUNTER — Ambulatory Visit (INDEPENDENT_AMBULATORY_CARE_PROVIDER_SITE_OTHER): Payer: No Typology Code available for payment source | Admitting: Obstetrics & Gynecology

## 2020-06-16 VITALS — BP 148/89 | HR 70 | Ht 65.0 in | Wt 308.0 lb

## 2020-06-16 DIAGNOSIS — N920 Excessive and frequent menstruation with regular cycle: Secondary | ICD-10-CM

## 2020-06-16 DIAGNOSIS — Z01419 Encounter for gynecological examination (general) (routine) without abnormal findings: Secondary | ICD-10-CM | POA: Diagnosis present

## 2020-06-16 DIAGNOSIS — D219 Benign neoplasm of connective and other soft tissue, unspecified: Secondary | ICD-10-CM

## 2020-06-16 LAB — SURGICAL PATHOLOGY

## 2020-06-16 NOTE — Progress Notes (Signed)
Patient had mammogram 02/01/2020. Colonoscopy done 06/13/2020. Kathrene Alu RN

## 2020-06-16 NOTE — Progress Notes (Signed)
Subjective:     Amanda Paul is a 45 y.o. female here for a routine exam. G0  Current complaints: Pt reports that she is here for a n has had fibrodis that were not cuaeign her sx. She was followed annually with Dr. Cletis Media. She reports 7-9 day cycels with on  the days like '[place]'. She was aware that she has fibroids.     Gynecologic History Patient's last menstrual period was 05/21/2020 (exact date). Contraception: none Last Pap: >5 years prev.  Last mammogram: Oct 2021. Results were: normal  Obstetric History OB History  Gravida Para Term Preterm AB Living  0 0 0 0 0 0  SAB IAB Ectopic Multiple Live Births  0 0 0 0       The following portions of the patient's history were reviewed and updated as appropriate: allergies, current medications, past family history, past medical history, past social history, past surgical history and problem list.  Review of Systems Pertinent items are noted in HPI.    Objective:  BP (!) 148/89   Pulse 70   Ht 5\' 5"  (1.651 m)   Wt (!) 308 lb (139.7 kg)   LMP 05/21/2020 (Exact Date)   BMI 51.25 kg/m      General Appearance:    Alert, cooperative, no distress, appears stated age  Head:    Normocephalic, without obvious abnormality, atraumatic  Eyes:    conjunctiva/corneas clear, EOM's intact, both eyes  Ears:    Normal external ear canals, both ears  Nose:   Nares normal, septum midline, mucosa normal, no drainage    or sinus tenderness  Throat:   Lips, mucosa, and tongue normal; teeth and gums normal  Neck:   Supple, symmetrical, trachea midline, no adenopathy;    thyroid:  no enlargement/tenderness/nodules  Back:     Symmetric, no curvature, ROM normal, no CVA tenderness  Lungs:     Clear to auscultation bilaterally, respirations unlabored  Chest Wall:    No tenderness or deformity   Heart:    Regular rate and rhythm, S1 and S2 normal, no murmur, rub   or gallop  Breast Exam:    No tenderness, masses, or nipple  abnormality  Abdomen:     Soft, non-tender, bowel sounds active all four quadrants,    no masses, no organomegaly; large pelvic mass; suspect fibroids. 30 weeks sized. Wide base.   Genitalia:    Normal female without lesion, discharge or tenderness     Extremities:   Extremities normal, atraumatic, no cyanosis or edema  Pulses:   2+ and symmetric all extremities  Skin:   Skin color, texture, turgor normal, no rashes or lesions    Assessment:    Healthy female exam.   Fibroids. Symptomatic.     Plan:  Abd/Pelvic CT CBC and BMP today F/u PAP and hrHPV  Julliette Frentz L. Harraway-Smith, M.D., Cherlynn June

## 2020-06-16 NOTE — Patient Instructions (Signed)
Abdominal Hysterectomy Abdominal hysterectomy is a surgical procedure to remove the uterus. The uterus is an organ that holds the baby as it develops before birth. This surgery may be done if a woman has certain problems of the uterus. These may include cancer or growths (tumors or fibroids). Other problems include infection, chronic pain, severe bleeding, or other problems of the menstrual cycle. The procedure may also be done if:  The uterus has slipped down into the vagina (uterine prolapse).  The tissue that lines the uterus is growing outside of its normal location (endometriosis). Depending on the reason for this procedure, other reproductive organs may also be removed. These could include:  The lowest part of the uterus (cervix), which opens into the vagina.  The organs that make eggs (ovaries).  The tubes that connect the ovaries to the uterus (fallopian tubes). Tell your health care provider about:  Any allergies you have.  All medicines you are taking, including vitamins, herbs, eye drops, creams, and over-the-counter medicines.  Any problems you or family members have had with anesthetic medicines.  Any blood disorders you have.  Any surgeries you have had.  Any medical conditions you have or have had.  Whether you are pregnant or may be pregnant. What are the risks? Generally, this is a safe procedure. However, problems may occur, including:  Bleeding.  Infection.  Allergic reactions to medicines or dyes.  Damage to nearby structures or organs.  Nerve injury.  Decreased interest in sex or pain during sex.  Blood clots that can break free and travel to your lungs. What happens before the procedure? Staying hydrated Follow instructions from your health care provider about hydration, which may include:  Up to 2 hours before the procedure - you may continue to drink clear liquids, such as water, clear fruit juice, black coffee, and plain tea. Eating and  drinking restrictions Follow instructions from your health care provider about eating and drinking, which may include:  8 hours before the procedure - stop eating heavy meals or foods, such as meat, fried foods, or fatty foods.  6 hours before the procedure - stop eating light meals or foods, such as toast or cereal.  6 hours before the procedure - stop drinking milk or drinks that contain milk.  2 hours before the procedure - stop drinking clear liquids. Medicines  Ask your health care provider about: ? Changing or stopping your regular medicines. This is especially important if you are taking diabetes medicines or blood thinners. ? Taking medicines such as aspirin and ibuprofen. These medicines can thin your blood. Do not take these medicines unless your health care provider tells you to take them. ? Taking over-the-counter medicines, vitamins, herbs, and supplements.  You may be asked to take a medicine to empty your colon (bowel preparation). General instructions  This procedure can affect the way you feel about yourself. Talk with your health care provider about the physical and emotional changes this procedure may cause.  Do not use any products that contain nicotine or tobacco for at least 4 weeks before the procedure. These products include cigarettes, chewing tobacco, and vaping devices, such as e-cigarettes. If you need help quitting, ask your health care provider.  Do not drink beverages that contain alcohol prior to the procedure. Alcohol can increase your risk of bleeding and complications. If you need help stopping, ask your health care provider.  Plan to have a responsible adult take you home from the hospital or clinic. Ask your health  care provider:  How your surgery site will be marked.  What steps will be taken to help prevent infection. These steps may include: ? Removing hair at the surgery site. ? Washing skin with a germ-killing soap. ? Taking antibiotic  medicine. What happens during the procedure?  An IV will be inserted into one of your veins.  You will be given one or more of the following: ? A medicine to help you relax (sedative). ? A medicine to make you fall asleep (general anesthetic). ? A medicine that is injected into your spine to numb the area below and slightly above the injection site (spinal anesthetic). ? A medicine that is injected into an area of your body to numb everything below the injection site (regional anesthetic).  Compression stockings will be placed on your legs to promote circulation.  A thin, flexible tube (catheter) will be inserted to help drain your urine.  An incision will be made through the skin in your lower abdomen. The incision may go side to side or up and down.  Your uterus and any other organs that need to be removed will be carefully taken out.  Bleeding will be controlled with clamps or stitches (sutures).  Your incision will be closed with sutures, skin glue, or adhesive strips.  A bandage (dressing) will be placed over the incision. The procedure may vary among health care providers and hospitals. What happens after the procedure?  Your blood pressure, heart rate, breathing rate, and blood oxygen level will be monitored until you leave the hospital or clinic.  You will be given pain medicine as needed.  Ask your health care provider how long you will need to stay in the hospital after your procedure.  You may have a liquid diet at first. You will most likely return to your usual diet the day after surgery.  You will still have the urinary catheter in place. It will likely be removed the day after surgery.  You may have to wear compression stockings. These stockings help to prevent blood clots and reduce swelling in your legs.  You will be encouraged to walk as soon as possible. You will do breathing exercises or use a device to help keep your lungs clear.  You may need to use a  sanitary napkin for discharge from the vagina. Summary  Abdominal hysterectomy is a surgical procedure to remove the uterus. The uterus is the organ that holds a developing baby.  This procedure can affect the way you feel about yourself. Talk with your health care provider about the physical and emotional changes this procedure may cause.  You will be given pain medicine after the procedure.  Ask your health care provider how long you will need to stay in the hospital after your procedure. This information is not intended to replace advice given to you by your health care provider. Make sure you discuss any questions you have with your health care provider. Document Revised: 11/29/2019 Document Reviewed: 11/29/2019 Elsevier Patient Education  2021 [place]. Abdominal Hysterectomy, Care After The following information offers guidance on how to care for yourself after your procedure. Your health care provider may also give you more specific instructions. If you have problems or questions, contact your health care provider. What can I expect after the procedure? After the procedure, it is common to have:  Pain.  Tiredness (fatigue).  Poor appetite.  Less interest in sex.  Vaginal bleeding and discharge. You may need to use a sanitary napkin  after this procedure.  Constipation.  Feelings of sadness or other emotions. Follow these instructions at home: Medicines  Take over-the-counter and prescription medicines only as told by your health care provider.  Do not take aspirin or NSAIDs, such as ibuprofen. These medicines can cause bleeding.  If you were prescribed an antibiotic medicine, take it as told by your health care provider. Do not stop using the antibiotic even if you start to feel better.  Ask your health care provider if the medicine prescribed to you: ? Requires you to avoid driving or using machinery. ? Can cause constipation. You may need to take these actions  to prevent or treat constipation:  Drink enough fluid to keep your urine pale yellow.  Take over-the-counter or prescription medicines.  Eat foods that are high in fiber, such as beans, whole grains, and fresh fruits and vegetables.  Limit foods that are high in fat and processed sugars, such as fried or sweet foods. Incision care  Follow instructions from your health care provider about how to take care of your incision. Make sure you: ? Wash your hands with soap and water for at least 20 seconds before and after you change your bandage (dressing). If soap and water are not available, use hand sanitizer. ? Change your dressing as told by your health care provider. ? Leave stitches (sutures), skin glue, or adhesive strips in place. These skin closures may need to stay in place for 2 weeks or longer. If adhesive strip edges start to loosen and curl up, you may trim the loose edges. Do not remove adhesive strips completely unless your health care provider tells you to do that.  Keep the dressing dry until your health care provider says it can be removed.  Check your incision area every day for signs of infection. Check for: ? More redness, swelling, or pain. ? Fluid or blood. ? Warmth. ? Pus or a bad smell.   Activity  Rest as told by your health care provider.  Avoid sitting for a long time without moving. Get up to take short walks every 1-2 hours. This is important to improve blood flow and breathing. Ask for help if you feel weak or unsteady.  Do not lift anything that is heavier than 10 lb (4.5 kg), or the limit that you are told, until your health care provider says that it is safe.  Follow instructions from your health care provider about exercise, driving, and general activities.  Return to your normal activities as told by your health care provider. Ask your health care provider what activities are safe for you.   Lifestyle  Do not douche, use tampons, or have sex for at  least 6 weeks or as told by your health care provider.  Do not drink alcohol until your health care provider approves.  Do not use any products that contain nicotine or tobacco. These products include cigarettes, chewing tobacco, and vaping devices, such as e-cigarettes. These can delay healing after surgery. If you need help quitting, ask your health care provider. General instructions  If you struggle with physical or emotional changes after your procedure, speak with your health care provider or a therapist.  Do not take baths, swim, or use a hot tub until your health care provider approves. Ask your health care provider if you may take showers. You may only be allowed to take sponge baths.  Try to have a responsible adult at home with you for the first 1-2 weeks to  help with your daily chores.  Wear compression stockings as told by your health care provider. These stockings help to prevent blood clots and reduce swelling in your legs.  Keep all follow-up visits. This is important. Contact a health care provider if:  You have any of these signs of infection: ? Chills or a fever. ? More redness, swelling, warmth, or pain around your incision. ? Fluid or blood coming from your incision. ? Pus or a bad smell coming from your incision.  Your incision opens.  You feel dizzy or light-headed.  You have pain or bleeding when you urinate.  You have diarrhea that does not go away.  You have nausea and vomiting that do not go away.  You have pus, or a bad-smelling discharge coming from your vagina.  You have any type of abnormal reaction such as a rash, or you develop an allergy to your medicine.  Your pain medicine does not help. Get help right away if:  You have a fever and your symptoms suddenly get worse.  You have severe pain in your abdomen.  You have shortness of breath.  You faint.  You have pain, swelling, or redness in your leg.  You have heavy vaginal bleeding  and blood clots, soaking through a sanitary napkin in less than 1 hour. These symptoms may represent a serious problem that is an emergency. Do not wait to see if the symptoms will go away. Get medical help right away. Call your local emergency services (911 in the [place] Do not drive yourself to the hospital. Summary  After your procedure, it is common to have pain, tiredness, and vaginal discharge.  Do not lift anything that is heavier than 10 lb (4.5 kg), or the limit that you are told, until your health care provider says that it is safe.  Follow instructions from your health care provider about exercise, driving, and general activities. Ask what activities are safe for you.  Do not take baths, swim, or use a hot tub until your health care provider approves. Ask your health care provider if you may take showers. You may only be allowed to take sponge baths.  Try to have a responsible adult at home with you for the first 1-2 weeks to help with your daily chores. This information is not intended to replace advice given to you by your health care provider. Make sure you discuss any questions you have with your health care provider. Document Revised: 11/29/2019 Document Reviewed: 11/29/2019 Elsevier Patient Education  2021 [place].   Myomectomy  Myomectomy is a surgery to remove a noncancerous fibroid (myoma) from the uterus. Myomas are tumors made up of fibrous tissue. They are often called fibroid tumors. They can range from the size of a pea to the size of a grapefruit. In a myomectomy, the fibroid tumor is removed without removing the uterus. This surgery is usually done if the tumor is growing or causing symptoms, such as pain, pressure, bleeding, pain during intercourse, or problems getting pregnant. Tell a health care provider about:  Any allergies you have.  All medicines you are taking, including vitamins, herbs, eye drops, creams, and over-the-counter medicines.  Any  problems you or family members have had with anesthetic medicines.  Any blood disorders you have.  Any surgeries you have had.  Any medical conditions you have.  Whether you are pregnant or may be pregnant. What are the risks? Generally, this is a safe procedure. However, problems may occur, including:  Bleeding.  Infection.  Allergic reactions to medicines.  Damage to nearby structures or organs.  Blood clots in the legs, chest, or brain.  Scar tissue on nearby organs and in the pelvis. This may require another surgery to remove the scar tissue. What happens before the procedure? Staying hydrated Follow instructions from your health care provider about hydration, which may include:  Up to 2 hours before the procedure - you may continue to drink clear liquids, such as water, clear fruit juice, black coffee, and plain tea.   Eating and drinking restrictions Follow instructions from your health care provider about eating and drinking, which may include:  8 hours before the procedure - stop eating heavy meals or foods, such as meat, fried foods, or fatty foods.  6 hours before the procedure - stop eating light meals or foods, such as toast or cereal.  6 hours before the procedure - stop drinking milk or drinks that contain milk.  2 hours before the procedure - stop drinking clear liquids. Medicines Ask your health care provider about:  Changing or stopping your regular medicines. This is especially important if you are taking diabetes medicines or blood thinners.  Taking medicines such as aspirin and ibuprofen. These medicines can thin your blood. Do not take these medicines unless your health care provider tells you to take them.  Taking over-the-counter medicines, vitamins, herbs, and supplements. General instructions  Do not use any products that contain nicotine or tobacco for at least 4 weeks before the procedure. These products include cigarettes, chewing tobacco,  and vaping devices, such as e-cigarettes. If you need help quitting, ask your health care provider.  Plan to have a responsible adult take you home from the hospital or clinic.  Plan to have a responsible adult care for you for the time you are told after you leave the hospital or clinic. This is important.  Ask your health care provider what steps will be taken to help prevent infection. These steps may include: ? Removing hair at the surgery site. ? Washing skin with a germ-killing soap. ? Taking antibiotic medicine. What happens during the procedure?  An IV will be inserted into one of your veins.  You will be given one or more of the following: ? A medicine to help you relax (sedative). ? A medicine to make you fall asleep (general anesthetic).  A small, thin tube (catheter) will be inserted into your bladder to drain urine.  One of the following methods will be used to remove the fibroid. The method used will depend on the size, shape, location, and number of fibroids. ? Hysteroscopic myomectomy. This may be done when the fibroid tumor is inside the cavity of the uterus. A long, thin tube with a lens (hysteroscope) will be inserted into the uterus through the vagina. A salt and water solution called saline will be put into the uterus. This will expand the uterus and allow the surgeon to see the fibroids. Tools will be passed through the hysteroscope to remove the fibroid tumor in pieces. ? Laparoscopic myomectomy. A few small incisions will be made in the lower abdomen. A thin, lighted tube with a camera (laparoscope) will be inserted through one of the incisions. This will give the surgeon a good view of the area. The fibroid tumor will be removed through the other incisions. The incisions will then be closed with stitches (sutures) or staples. ? Abdominal myomectomy. This is done when the fibroid tumor cannot be removed with a  hysteroscope or laparoscope. The fibroid tumor will be  removed through a larger incision that is made in the abdomen. The incision will be closed with sutures or staples. Recovery time will be longer with this method. These procedures may vary among health care providers and hospitals. What can I expect after the procedure?  Your blood pressure, heart rate, breathing rate, and blood oxygen level will be monitored until you leave the hospital or clinic.  The IV and catheter will remain inserted for a period of time.  You may be given medicine for pain as needed.  You may be given an antibiotic medicine if needed.  If you were given a sedative during the procedure, it can affect you for several hours. Do not drive or operate machinery until your health care provider says that it is safe. Summary  Myomectomy is a surgery to remove a noncancerous fibroid (myoma) from the uterus.  This surgery is usually done if the tumor is growing or causing symptoms, such as pain, pressure, bleeding, pain during intercourse, or problems getting pregnant.  Follow instructions from your health care provider about eating and drinking before the procedure.  Recovery time depends on the method used in this procedure. The abdominal method will require a longer recovery. This information is not intended to replace advice given to you by your health care provider. Make sure you discuss any questions you have with your health care provider. Document Revised: 11/01/2019 Document Reviewed: 11/01/2019 Elsevier Patient Education  [employer].

## 2020-06-17 LAB — CBC WITH DIFFERENTIAL/PLATELET
Basophils Absolute: 0 10*3/uL (ref 0.0–0.2)
Basos: 1 %
EOS (ABSOLUTE): 0.2 10*3/uL (ref 0.0–0.4)
Eos: 4 %
Hematocrit: 42.3 % (ref 34.0–46.6)
Hemoglobin: 13.3 g/dL (ref 11.1–15.9)
Immature Grans (Abs): 0 10*3/uL (ref 0.0–0.1)
Immature Granulocytes: 0 %
Lymphocytes Absolute: 1.3 10*3/uL (ref 0.7–3.1)
Lymphs: 30 %
MCH: 28.2 pg (ref 26.6–33.0)
MCHC: 31.4 g/dL — ABNORMAL LOW (ref 31.5–35.7)
MCV: 90 fL (ref 79–97)
Monocytes Absolute: 0.6 10*3/uL (ref 0.1–0.9)
Monocytes: 15 %
Neutrophils Absolute: 2.1 10*3/uL (ref 1.4–7.0)
Neutrophils: 50 %
Platelets: 257 10*3/uL (ref 150–450)
RBC: 4.72 x10E6/uL (ref 3.77–5.28)
RDW: 13 % (ref 11.7–15.4)
WBC: 4.2 10*3/uL (ref 3.4–10.8)

## 2020-06-17 LAB — BASIC METABOLIC PANEL
BUN/Creatinine Ratio: 24 — ABNORMAL HIGH (ref 9–23)
BUN: 14 mg/dL (ref 6–24)
CO2: 21 mmol/L (ref 20–29)
Calcium: 9.5 mg/dL (ref 8.7–10.2)
Chloride: 101 mmol/L (ref 96–106)
Creatinine, Ser: 0.59 mg/dL (ref 0.57–1.00)
Glucose: 77 mg/dL (ref 65–99)
Potassium: 4.5 mmol/L (ref 3.5–5.2)
Sodium: 137 mmol/L (ref 134–144)
eGFR: 112 mL/min/{1.73_m2} (ref >59)

## 2020-06-18 ENCOUNTER — Encounter: Payer: Self-pay | Admitting: Nurse Practitioner

## 2020-06-18 ENCOUNTER — Ambulatory Visit (INDEPENDENT_AMBULATORY_CARE_PROVIDER_SITE_OTHER): Payer: No Typology Code available for payment source | Admitting: Nurse Practitioner

## 2020-06-18 ENCOUNTER — Other Ambulatory Visit: Payer: Self-pay

## 2020-06-18 VITALS — BP 160/92 | HR 89 | Temp 98.8°F | Ht 64.6 in | Wt 307.8 lb

## 2020-06-18 DIAGNOSIS — Z7689 Persons encountering health services in other specified circumstances: Secondary | ICD-10-CM

## 2020-06-18 DIAGNOSIS — Z6841 Body Mass Index (BMI) 40.0 and over, adult: Secondary | ICD-10-CM | POA: Diagnosis not present

## 2020-06-18 DIAGNOSIS — R030 Elevated blood-pressure reading, without diagnosis of hypertension: Secondary | ICD-10-CM | POA: Diagnosis not present

## 2020-06-18 LAB — CYTOLOGY - PAP
Adequacy: ABSENT
Comment: NEGATIVE
Diagnosis: NEGATIVE
High risk HPV: NEGATIVE

## 2020-06-18 MED ORDER — AMLODIPINE BESYLATE 5 MG PO TABS: 5.0000 mg | ORAL_TABLET | Freq: Every day | ORAL | 2 refills | Status: DC

## 2020-06-18 NOTE — Progress Notes (Signed)
Rutherford Nail as a Education administrator for [employer], NP.,have documented all relevant documentation on the behalf of [employer], NP,as directed by  Bary Castilla, NP while in the presence of Bary Castilla, NP. This visit occurred during the SARS-CoV-2 public health emergency.  Safety protocols were in place, including screening questions prior to the visit, additional usage of staff PPE, and extensive cleaning of exam room while observing appropriate contact time as indicated for disinfecting solutions.  Subjective:     Patient ID: Amanda Paul , female    DOB: Jul 03, 1975 , 45 y.o.   MRN: [id number]   Chief Complaint  Patient presents with  . Establish Care    HPI  Pt presents today to establish care with a new provider. She was previously a patient of Dr Baird [facility] but left to go somewhere else for convenience. She has no concerns at this time. She last saw Dr. Ronnald Ramp. She has had a repaired hernia. She takes meloxicam as needed for bone spur. She sees Dr. Launa Flight for OBGYN and her paps smear done there. She also had a mammogram in October.  She is having hysterectomy soon due to having fibroids.  She works at [employer]. She LMP: feb 9 th normal.  Diet: She does not follow a diet. She doesn't eat healthy. She does try to have a healthy dinner. She does drink soda at times. She does drink coffee a week. She does eat deli meat.  She does eat sugary drinks.   Exercise: she walks at work.   Wt Readings from Last 3 Encounters: 06/18/20 : (!) 307 lb 12.8 oz (139.6 kg) 06/16/20 : (!) 308 lb (139.7 kg) 06/13/20 : (!) 301 lb (136.5 kg)      Past Medical History:  Diagnosis Date  . Allergy   . Arthritis   . Hernia, umbilical   . Morbid obesity ([place])      Family History  Problem Relation Age of Onset  . Hypertension Mother      Current Outpatient Medications:  .  acetaminophen (TYLENOL) 325 MG tablet, Take 650 mg by mouth every 6 (six) hours as  needed for moderate pain., Disp: , Rfl:  .  amLODipine (NORVASC) 5 MG tablet, Take 1 tablet (5 mg total) by mouth daily., Disp: 30 tablet, Rfl: 2 .  Calcium Carb-Cholecalciferol (CALCIUM 500 +D PO), Take 1 tablet by mouth daily., Disp: , Rfl:  .  Cholecalciferol (VITAMIN D) 2000 UNITS CAPS, Take 2,000 Units by mouth daily., Disp: , Rfl:  .  CINNAMON PO, Take 1 capsule by mouth daily., Disp: , Rfl:  .  diclofenac Sodium (VOLTAREN) 1 % GEL, Apply 2 g topically daily as needed (pain)., Disp: , Rfl:  .  magnesium oxide (MAG-OX) 400 MG tablet, Take 400 mg by mouth daily., Disp: , Rfl:  .  meloxicam (MOBIC) 15 MG tablet, Take 1 tablet (15 mg total) by mouth daily., Disp: 30 tablet, Rfl: 3 .  Multiple Vitamin (MULTIVITAMIN) capsule, Take 1 capsule by mouth daily., Disp: , Rfl:  .  naproxen sodium (ALEVE) 220 MG tablet, Take 220 mg by mouth daily as needed (pain)., Disp: , Rfl:    Allergies  Allergen Reactions  . Amoxicillin Hives, Itching and Swelling    Swollen eyes and throat.  Marland Kitchen Penicillins Hives and Swelling     Review of Systems  Constitutional: Negative.  Negative for fatigue.  HENT: Negative.   Respiratory: Negative for cough, shortness of breath and wheezing.   Cardiovascular: Negative  for chest pain and palpitations.  Endocrine: Negative for polydipsia, polyphagia and polyuria.  Musculoskeletal: Negative.   Skin: Negative.   Neurological: Negative for dizziness and headaches.  Psychiatric/Behavioral: Negative.      Today's Vitals   06/18/20 1016  BP: (!) 160/92  Pulse: 89  Temp: 98.8 F (37.1 C)  TempSrc: Oral  Weight: (!) 307 lb 12.8 oz (139.6 kg)  Height: 5' 4.6" (1.641 m)   Body mass index is 51.86 kg/m.   Objective:  Physical Exam Constitutional:      General: She is not in acute distress.    Appearance: Normal appearance. She is obese.  HENT:     Head: Normocephalic and atraumatic.     Nose: No congestion.  Cardiovascular:     Rate and Rhythm: Normal rate  and regular rhythm.     Pulses: Normal pulses.     Heart sounds: Normal heart sounds. No murmur heard.   Pulmonary:     Effort: Pulmonary effort is normal. No respiratory distress.     Breath sounds: Normal breath sounds. No wheezing.  Skin:    General: Skin is warm and dry.     Capillary Refill: Capillary refill takes less than 2 seconds.  Neurological:     General: No focal deficit present.     Mental Status: She is alert and oriented to person, place, and time.     Cranial Nerves: No cranial nerve deficit.  Psychiatric:        Mood and Affect: Mood normal.        Behavior: Behavior normal.        Thought Content: Thought content normal.        Judgment: Judgment normal.         Assessment And Plan:     1. Encounter to establish care with new doctor -Went over patient allergies, medications, medical history, social history with patient.  -Patient will come back in 3-4 weeks for a annual exam   2. Elevated blood pressure reading -Elevated BP reading today in office today  -Educated patient to monitor it at home.  -Educated patient about the importance of eating a heart healthy diet by avoiding processed and sodium  Will start her on medication and reevaluate in 3-4 weeks - amLODipine (NORVASC) 5 MG tablet; Take 1 tablet (5 mg total) by mouth daily.  Dispense: 30 tablet; Refill: 2  3. Class 3 severe obesity due to excess calories without serious comorbidity with body mass index (BMI) of 50.0 to 59.9 in adult [facility])  -Educated patient about the importance of eating a healthy diet and exercise for atleast 30-45 min daily.   Follow up in 3-4 weeks for annual physical exam and BP check.    Patient was given opportunity to ask questions. Patient verbalized understanding of the plan and was able to repeat key elements of the plan. All questions were answered to their satisfaction.  Bary Castilla, NP   I, Bary Castilla, NP, have reviewed all documentation for this visit.  The documentation on 06/18/20 for the exam, diagnosis, procedures, and orders are all accurate and complete.  THE PATIENT IS ENCOURAGED TO PRACTICE SOCIAL DISTANCING DUE TO THE COVID-19 PANDEMIC.

## 2020-06-18 NOTE — Patient Instructions (Signed)
Preventing Hypertension Hypertension, also called high blood pressure, is when the force of blood pumping through the arteries is too strong. Arteries are blood vessels that carry blood from the heart throughout the body. Often, hypertension does not cause symptoms until blood pressure is very high. It is important to have your blood pressure checked regularly. Diet and lifestyle changes can help you prevent hypertension, and they may make you feel better overall and improve your quality of life. If you already have hypertension, you may control it with diet and lifestyle changes, as well as with medicine. How can this condition affect me? Over time, hypertension can damage the arteries and decrease blood flow to important parts of the body, including the brain, heart, and kidneys. By keeping your blood pressure in a healthy range, you can help prevent complications like heart attack, heart failure, stroke, kidney failure, and vascular dementia. What can increase my risk?  Being an older adult. Older people are more often affected.  Having family members who have had high blood pressure.  Being obese.  Being female. Males are more likely to have high blood pressure.  Drinking too much alcohol or caffeine.  Smoking or using illegal drugs.  Taking certain medicines, such as antidepressants, decongestants, birth control pills, and NSAIDs, such as ibuprofen.  Having thyroid problems.  Having certain tumors. What actions can I take to prevent or manage this condition? Work with your health care provider to make a hypertension prevention plan that works for you. Follow your plan and keep all follow-up visits as told by your health care provider. Diet changes Maintain a healthy diet. This includes:  Eating less salt (sodium). Ask your health care provider how much sodium is safe for you to have. The general recommendation is to have less than 1 tsp (2,300 mg) of sodium a day. ? Do not add salt  to your food. ? Choose low-sodium options when grocery shopping and eating out.  Limiting fats in your diet. You can do this by eating low-fat or fat-free dairy products and by eating less red meat.  Eating more fruits, vegetables, and whole grains. Make a goal to eat: ? 1-2 cups of fresh fruits and vegetables each day. ? 3-4 servings of whole grains each day.  Avoiding foods and beverages that have added sugars.  Eating fish that contain healthy fats (omega-3 fatty acids), such as mackerel or salmon. If you need help putting together a healthy eating plan, try the DASH diet. This diet is high in fruits, vegetables, and whole grains. It is low in sodium, red meat, and added sugars. DASH stands for Dietary Approaches to Stop Hypertension.   Lifestyle changes Lose weight if you are overweight. Losing just 3?5% of your body weight can help prevent or control hypertension. For example, if your present weight is 200 lb (91 kg), a loss of 3-5% of your weight means losing 6-10 lb (2.7-4.5 kg). Ask your health care provider to help you with a diet and exercise plan to safely lose weight. Other recommendations usually include:  Get enough exercise. Do at least 150 minutes of moderate-intensity exercise each week. You could do this in short exercise sessions several times a day, or you could do longer exercise sessions a few times a week. For example, you could take a brisk 10-minute walk or bike ride, 3 times a day, for 5 days a week.  Find ways to reduce stress, such as exercising, meditating, listening to music, or taking a yoga class.  If you need help reducing stress, ask your health care provider.  Do not use any products that contain nicotine or tobacco, such as cigarettes, e-cigarettes, and chewing tobacco. If you need help quitting, ask your health care provider. Chemicals in tobacco and nicotine products raise your blood pressure each time you use them. If you need help quitting, ask your health  care provider.  Learn how to check your blood pressure at home. Make sure that you know your personal target blood pressure, as told by your health care provider.  Try to sleep 7-9 hours per night.   Alcohol use  Do not drink alcohol if: ? Your health care provider tells you not to drink. ? You are pregnant, may be pregnant, or are planning to become pregnant.  If you drink alcohol: ? Limit how much you use to:  0-1 drink a day for women.  0-2 drinks a day for men. ? Be aware of how much alcohol is in your drink. In the [place] one drink equals one 12 oz bottle of beer (355 mL), one 5 oz glass of wine (148 mL), or one 1 oz glass of hard liquor (44 mL). Medicines In addition to diet and lifestyle changes, your health care provider may recommend medicines to help lower your blood pressure. In general:  You may need to try a few different medicines to find what works best for you.  You may need to take more than one medicine.  Take over-the-counter and prescription medicines only as told by your health care provider. Questions to ask your health care provider  What is my blood pressure goal?  How can I lower my risk for high blood pressure?  How should I monitor my blood pressure at home? Where to find support Your health care provider can help you prevent hypertension and help you keep your blood pressure at a healthy level. Your local hospital or your community may also provide support services and prevention programs. The American Heart Association offers an online support network at supportnetwork.heart.org Where to find more information Learn more about hypertension from:  [place], Lung, and [place]: https://wilson-eaton.com/  Centers for Disease Control and Prevention: http://www.wolf.info/  American Academy of Family Physicians: familydoctor.org Learn more about the DASH diet from:  [place], Lung, and [place]: https://wilson-eaton.com/ Contact a health care  provider if:  You think you are having a reaction to medicines you have taken.  You have recurrent headaches or feel dizzy.  You have swelling in your ankles.  You have trouble with your vision. Get help right away if:  You have sudden, severe chest, back, or abdominal pain or discomfort.  You have shortness of breath.  You have a sudden, severe headache. These symptoms may represent a serious problem that is an emergency. Do not wait to see if the symptoms will go away. Get medical help right away. Call your local emergency services (911 in the [place] Do not drive yourself to the hospital.  Summary  Hypertension often does not cause any symptoms until blood pressure is very high. It is important to get your blood pressure checked regularly.  Diet and lifestyle changes are important steps in preventing hypertension.  By keeping your blood pressure in a healthy range, you may prevent complications like heart attack, heart failure, stroke, and kidney failure.  Work with your health care provider to make a hypertension prevention plan that works for you. This information is not intended to replace advice given to  you by your health care provider. Make sure you discuss any questions you have with your health care provider. Document Revised: 02/27/2019 Document Reviewed: 02/27/2019 Elsevier Patient Education  2021 [employer].

## 2020-06-23 ENCOUNTER — Encounter (HOSPITAL_BASED_OUTPATIENT_CLINIC_OR_DEPARTMENT_OTHER): Payer: Self-pay

## 2020-06-23 ENCOUNTER — Ambulatory Visit (HOSPITAL_BASED_OUTPATIENT_CLINIC_OR_DEPARTMENT_OTHER)
Admission: RE | Admit: 2020-06-23 | Discharge: 2020-06-23 | Disposition: A | Discharge status: Home / Self Care | Payer: No Typology Code available for payment source | Source: Ambulatory Visit | Attending: Obstetrics & Gynecology | Admitting: Obstetrics & Gynecology

## 2020-06-23 ENCOUNTER — Other Ambulatory Visit: Payer: Self-pay

## 2020-06-23 DIAGNOSIS — D219 Benign neoplasm of connective and other soft tissue, unspecified: Secondary | ICD-10-CM | POA: Insufficient documentation

## 2020-06-23 MED ORDER — IOHEXOL 300 MG/ML  SOLN
100.0000 mL | Freq: Once | INTRAMUSCULAR | Status: AC | PRN
  Administered 2020-06-23: 100 mL via INTRAVENOUS

## 2020-06-30 ENCOUNTER — Ambulatory Visit (INDEPENDENT_AMBULATORY_CARE_PROVIDER_SITE_OTHER): Payer: No Typology Code available for payment source | Admitting: Obstetrics & Gynecology

## 2020-06-30 ENCOUNTER — Other Ambulatory Visit (HOSPITAL_COMMUNITY)
Admission: RE | Admit: 2020-06-30 | Discharge: 2020-06-30 | Disposition: A | Discharge status: Home / Self Care | Payer: No Typology Code available for payment source | Source: Ambulatory Visit | Attending: Obstetrics & Gynecology | Admitting: Obstetrics & Gynecology

## 2020-06-30 ENCOUNTER — Encounter: Payer: Self-pay | Admitting: Obstetrics & Gynecology

## 2020-06-30 ENCOUNTER — Other Ambulatory Visit: Payer: Self-pay

## 2020-06-30 VITALS — BP 147/88 | HR 80 | Ht 65.5 in | Wt 307.0 lb

## 2020-06-30 DIAGNOSIS — N920 Excessive and frequent menstruation with regular cycle: Secondary | ICD-10-CM | POA: Diagnosis present

## 2020-06-30 DIAGNOSIS — D251 Intramural leiomyoma of uterus: Secondary | ICD-10-CM | POA: Insufficient documentation

## 2020-06-30 NOTE — Patient Instructions (Signed)
Uterine Artery Embolization for Fibroids  Uterine artery embolization is a procedure to shrink uterine fibroids. Uterine fibroids are masses of tissue (tumors) that can develop in the womb (uterus). They are also called leiomyomas. This type of tumor is not cancerous (benign) and does not spread to other parts of the body outside of the pelvic area. The pelvic area is the part of the body between the hip bones. You can have one or many fibroids. Fibroids can vary in size, shape, weight, and where they grow in the uterus. Some can become quite large. In this procedure, a thin plastic tube (catheter) is used to inject a chemical that blocks off the blood supply to the fibroid, which causes the fibroid to shrink. Tell a health care provider about:  Any allergies you have.  All medicines you are taking, including vitamins, herbs, eye drops, creams, and over-the-counter medicines.  Any problems you or family members have had with anesthetic medicines.  Any blood disorders you have.  Any surgeries you have had.  Any medical conditions you have.  Whether you are pregnant or may be pregnant. What are the risks? Generally, this is a safe procedure. However, problems may occur, including:  Bleeding.  Allergic reactions to medicines or dyes.  Damage to other structures or organs.  Infection, including blood infection (septicemia).  Injury to the uterus from decreased blood supply.  Lack of menstrual periods (amenorrhea).  Death of tissue cells (necrosis) around your bladder or vulva.  Development of a hole between organs or from an organ to the surface of your skin (fistula).  Blood clot in the legs (deep vein thrombosis) or lung (pulmonary embolus).  Nausea and vomiting. What happens before the procedure? Staying hydrated Follow instructions from your health care provider about hydration, which may include:  Up to 2 hours before the procedure - you may continue to drink clear  liquids, such as water, clear fruit juice, black coffee, and plain tea. Eating and drinking restrictions Follow instructions from your health care provider about eating and drinking, which may include:  8 hours before the procedure - stop eating heavy meals or foods such as meat, fried foods, or fatty foods.  6 hours before the procedure - stop eating light meals or foods, such as toast or cereal.  6 hours before the procedure - stop drinking milk or drinks that contain milk.  2 hours before the procedure - stop drinking clear liquids. Medicines  Ask your health care provider about: ? Changing or stopping your regular medicines. This is especially important if you are taking diabetes medicines or blood thinners. ? Taking over-the-counter medicines, vitamins, herbs, and supplements. ? Taking medicines such as aspirin and ibuprofen. These medicines can thin your blood. Do not take these medicines unless your health care provider tells you to take them.  You may be given antibiotic medicine to help prevent infection.  You may be given medicine to prevent nausea and vomiting (antiemetic). General instructions  Ask your health care provider how your surgical site will be marked or identified.  You may be asked to shower with a germ-killing soap.  Plan to have someone take you home from the hospital or clinic.  If you will be going home right after the procedure, plan to have someone with you for 24 hours.  You will be asked to empty your bladder. What happens during the procedure?  To lower your risk of infection: ? Your health care team will wash or sanitize their hands. ?   medicine to help you relax (sedative). A medicine to numb the area (local anesthetic). A small cut (incision) will be made in your groin. A catheter will be inserted into the main artery of your leg. The catheter  will be guided through the artery to your uterus. A series of images will be taken while dye is injected through the catheter in your groin. X-rays are taken at the same time. This is done to provide a road map of the blood supply to your uterus and fibroids. Tiny plastic spheres, about the size of sand grains, will be injected through the catheter. Metal coils may be used to help block the artery. The particles will lodge in tiny branches of the uterine artery that supplies blood to the fibroids. The procedure will be repeated on the artery that supplies the other side of the uterus. The catheter will be removed and pressure will be applied to stop the bleeding. A dressing will be placed over the incision. The procedure may vary among health care providers and hospitals. What happens after the procedure? Your blood pressure, heart rate, breathing rate, and blood oxygen level will be monitored until the medicines you were given have worn off. You will be given pain medicine as needed. You may be given medicine for nausea and vomiting as needed. Do not drive for 24 hours if you were given a sedative. Summary Uterine artery embolization is a procedure to shrink uterine fibroids by blocking the blood supply to the fibroid. You may be given a sedative and local anesthetic for the procedure. A catheter will be inserted into the main artery of your leg. The catheter will be guided through the artery to your uterus. After the procedure you will be given pain medicine and medicine for nausea as needed. Do not drive for 24 hours if you were given a sedative. This information is not intended to replace advice given to you by your health care provider. Make sure you discuss any questions you have with your health care provider. Document Revised: 03/11/2017 Document Reviewed: 07/01/2016 Elsevier Patient Education  2021 Else[employer] Inc. Uterine Artery Embolization for Fibroids, Care After This sheet gives you  information about how to care for yourself after your procedure. Your health care provider may also give you more specific instructions. If you have problems or questions, contact your health care provider. What can I expect after the procedure? After your procedure, it is common to have: Pelvic cramping. You will be given pain medicine. Nausea and vomiting. You may be given medicine to help relieve nausea. Follow these instructions at home: Incision care Follow instructions from your health care provider about how to take care of your incision. Make sure you: Wash your hands with soap and water before you change your bandage (dressing). If soap and water are not available, use hand sanitizer. Change your dressing as told by your health care provider. Check your incision area every day for signs of infection. Check for: More redness, swelling, or pain. More fluid or blood. Warmth. Pus or a bad smell. Medicines  Take over-the-counter and prescription medicines only as told by your health care provider. Do not take aspirin. It can cause bleeding. Do not drive for 24 hours if you were given a medicine to help you relax (sedative). Do not drive or use heavy machinery while taking prescription pain medicine. General instructions Ask your health care provider when you can resume sexual activity. To prevent or treat constipation while you are taking prescription   pain medicine, your health care provider may recommend that you: Drink enough fluid to keep your urine clear or pale yellow. Take over-the-counter or prescription medicines. Eat foods that are high in fiber, such as fresh fruits and vegetables, whole grains, and beans. Limit foods that are high in fat and processed sugars, such as fried and sweet foods. Contact a health care provider if: You have a fever. You have more redness, swelling, or pain around your incision site. You have more fluid or blood coming from your incision  site. Your incision feels warm to the touch. You have pus or a bad smell coming from your incision. You have a rash. You have uncontrolled nausea or you cannot eat or drink anything without vomiting. Get help right away if: You have trouble breathing. You have chest pain. You have severe abdominal pain. You have leg pain. You become dizzy and faint. Summary After your procedure, it is common to have pelvic cramping. You will be given pain medicine. Follow instructions from your health care provider about how to take care of your incision. Check your incision area every day for signs of infection. Take over-the-counter and prescription medicines only as told by your health care provider. This information is not intended to replace advice given to you by your health care provider. Make sure you discuss any questions you have with your health care provider. Document Revised: 03/11/2017 Document Reviewed: 07/01/2016 Elsevier Patient Education  2021 Elsevier Inc.  

## 2020-06-30 NOTE — Progress Notes (Signed)
History:  45 y.o. G0P0000 here today for review of imaging and discussion of management of uterine fibroids. 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  She reports that she has one day of very heavy bleeding and the rest is 'normal'.  She has read up on the different management options and wants to discuss hysterectomy vs UFE.     The following portions of the patient's history were reviewed and updated as appropriate: allergies, current medications, past family history, past medical history, past social history, past surgical history and problem list.  Review of Systems:  Pertinent items are noted in HPI.    Objective:  Physical Exam Blood pressure (!) 147/88, pulse 80, height 5' 5.5" (1.664 m), weight (!) 307 lb (139.3 kg), last menstrual period 06/19/2020.  CONSTITUTIONAL: Well-developed, well-nourished female in no acute distress.  HENT:  Normocephalic, atraumatic EYES: Conjunctivae and EOM are normal. No scleral icterus.  NECK: Normal range of motion SKIN: Skin is warm and dry. No rash noted. Not diaphoretic.No pallor. [place]: Alert and oriented to person, place, and time. Normal coordination.  Abd: Soft, nontender and nondistended; mass effect palpated.   GYN PROCEDURE The indications for endometrial biopsy were reviewed.   Risks of the biopsy including cramping, bleeding, infection, uterine perforation, inadequate specimen and need for additional procedures  were discussed. The patient states she understands and agrees to undergo procedure today. Consent was signed. Time out was performed. Urine HCG was negative. A sterile speculum was placed in the patient's vagina and the cervix was prepped with Betadine. A single-toothed tenaculum was placed on the anterior lip of the cervix to stabilize it. The 3 mm pipelle was introduced into the endometrial cavity without difficulty to a depth of 11cm, and a moderate  amount of tissue was obtained and sent to pathology. The instruments were removed from the patient's vagina. Minimal bleeding from the cervix was noted. The patient tolerated the procedure well. Routine post-procedure instructions were given to the patient. The patient will follow up to review the results and for further management.    Labs and Imaging CT ABDOMEN PELVIS W CONTRAST  Result Date: 06/23/2020 CLINICAL DATA:  Preoperative planning for bulky uterine fibroids. EXAM: CT ABDOMEN AND PELVIS WITH CONTRAST TECHNIQUE: Multidetector CT imaging of the abdomen and pelvis was performed using the standard protocol following bolus administration of intravenous contrast. CONTRAST:  18mL OMNIPAQUE IOHEXOL 300 MG/ML  SOLN COMPARISON:  04/12/2012 FINDINGS: Lower chest: 8 mm right middle lobe subpleural nodule identified on image 3 of series 4. Hepatobiliary: Asymmetric enlargement of the lateral segment left liver with heterogeneous parenchyma but no overt nodularity of hepatic contour. No focal abnormality in the liver on this study without intravenous contrast. There is no evidence for gallstones, gallbladder wall thickening, or pericholecystic fluid. No intrahepatic or extrahepatic biliary dilation. Pancreas: No focal mass lesion. No dilatation of the main duct. No intraparenchymal cyst. No peripancreatic edema. Spleen: No splenomegaly. No focal mass lesion. Adrenals/Urinary Tract: No adrenal nodule or mass. Kidneys unremarkable. No evidence for hydroureter. The urinary bladder appears normal for the degree of distention. Stomach/Bowel: Stomach is nondistended. Duodenum is normally positioned as is the ligament of Treitz. No small bowel wall thickening. No small bowel dilatation. The terminal ileum is normal. The appendix is not well visualized, but there is no edema or inflammation in the region of the cecum. No gross colonic mass. No colonic wall thickening. Diverticular  changes are noted in the left colon  without evidence of diverticulitis. Vascular/Lymphatic: No abdominal aortic aneurysm. There is no gastrohepatic or hepatoduodenal ligament lymphadenopathy. No retroperitoneal or mesenteric lymphadenopathy. No pelvic sidewall lymphadenopathy. Reproductive: Uterus is markedly enlarged and lobular, consistent with bulky fibroid disease. Including fibroids, uterus measures 20 x 14 x 20 cm (2930 cc). There is no adnexal mass. Other: No intraperitoneal free fluid. Musculoskeletal: No worrisome lytic or sclerotic osseous abnormality. IMPRESSION: 1. Markedly enlarged, lobular uterus consistent with bulky fibroid disease. 2. 8 mm right middle lobe subpleural nodule. Non-contrast chest CT at 6-12 months is recommended. If the nodule is stable at time of repeat CT, then future CT at 18-24 months (from today's scan) is considered optional for low-risk patients, but is recommended for high-risk patients. This recommendation follows the consensus statement: Guidelines for Management of Incidental Pulmonary Nodules Detected on CT Images: From the Fleischner Society 2017; Radiology 2017; 284:228-243. 3. Asymmetric enlargement of the lateral segment left liver with heterogeneous parenchyma but no overt nodularity of hepatic contour. Imaging features are nonspecific but may be related to early cirrhosis. Attention on follow-up recommended. 4. Left colonic diverticulosis without diverticulitis. Electronically Signed   By: Misty Stanley M.D.   On: 06/23/2020 11:10   CBC Latest Ref Rng & Units 06/16/2020 09/30/2015 08/26/2014  WBC 3.4 - 10.8 x10E3/uL 4.2 5.9 7.0  Hemoglobin 11.1 - 15.9 g/dL 13.3 12.0(A) 12.7  Hematocrit 34.0 - 46.6 % 42.3 35.2(A) 39.0  Platelets 150 - 450 x10E3/uL 257 - 310   Assessment & Plan:  Menorrhagia with reg cycle and large sx fibroids. I have reviewed with pt the risks and benefits of UFE and hysterectomy. The pros and cons of each were discussed including the increased risk of surgery in the face of  obesity.  Pt asked questions. All were addressed and she expressed understanding.   Pt has an endo bx today to prepare for a UFE.  Referral to interventional radiology.  F/u after procedure.  F/u results of surg path   Total time on patients care was 35 min.  This included review of CT and discussion of procedures as above. This was in addition to the bx that was done at the end.   Keagon Glascoe L. Harraway-Smith, M.D., Cherlynn June

## 2020-06-30 NOTE — Progress Notes (Signed)
Follow up fibroids and imaging done. Kathrene Alu RN

## 2020-07-01 LAB — SURGICAL PATHOLOGY

## 2020-07-02 ENCOUNTER — Other Ambulatory Visit: Payer: Self-pay | Admitting: Obstetrics & Gynecology

## 2020-07-02 DIAGNOSIS — D250 Submucous leiomyoma of uterus: Secondary | ICD-10-CM

## 2020-07-03 ENCOUNTER — Telehealth: Payer: Self-pay

## 2020-07-03 NOTE — Telephone Encounter (Signed)
Patient called and made aware that EMB was negative. Patient also states that she does have consult scheduled with [facility] next Thursday. Kathrene Alu RN

## 2020-07-10 ENCOUNTER — Other Ambulatory Visit: Payer: Self-pay

## 2020-07-10 ENCOUNTER — Ambulatory Visit
Admission: RE | Admit: 2020-07-10 | Discharge: 2020-07-10 | Disposition: A | Discharge status: Home / Self Care | Payer: No Typology Code available for payment source | Source: Ambulatory Visit | Attending: Obstetrics & Gynecology | Admitting: Obstetrics & Gynecology

## 2020-07-10 ENCOUNTER — Encounter: Payer: Self-pay | Admitting: *Deleted

## 2020-07-10 DIAGNOSIS — D250 Submucous leiomyoma of uterus: Secondary | ICD-10-CM

## 2020-07-10 HISTORY — PX: IR RADIOLOGIST EVAL & MGMT: IMG5224

## 2020-07-10 NOTE — Consult Note (Signed)
Chief Complaint: Patient was consulted remotely today (TeleHealth) for uterine fibroids at the request of Harraway-Smith,Carolyn.    Referring Physician(s): Harraway-Smith,Carolyn  History of Present Illness: Amanda Paul is a 45 y.o. G0P0 African American female with a history of known uterine fibroids since approximately 2014.  At that time, a CT of the abdomen and pelvis was performed for abdominal pain on 04/12/2012 demonstrating an umbilical hernia and moderate uterine enlargement.  The patient did undergo hysteroscopy with D&C on 08/26/2014 by Dr. Cletis Media for menorrhagia and endometrial polyp.  Pathology revealed endometrioid polyp and fragments of benign smooth muscle suggestive of leiomyomata.  More recently the patient has establish care with Dr. Ihor Dow.  Pap smear on 06/16/2020 was negative.  Endometrial biopsy on 06/30/2020 demonstrated benign endometrium with tubal metaplasia and no evidence of hyperplasia or malignancy.  CT of the abdomen and pelvis was performed on 06/23/2020 demonstrating significant uterine enlargement with estimated uterine volume of 3900 mL.  No adnexal masses or abnormal lymphadenopathy detected by CT.  The patient states that her menstrual cycles typically last approximately 7 to 9 days with 1 day of heavy bleeding.  She has no history of significant anemia or need for prior transfusions.  She has experienced some mild prior bulk symptoms consisting of periodic pain in the upper pelvis with one episode a few months ago of sharp left-sided pain around the waistline which resolved after rest.  She does perform a fair amount of heavy lifting in her job at [employer].  She does not have any significant chronic back pain, rectal pain, increased urinary frequency or nocturia.  Past Medical History:  Diagnosis Date  . Allergy   . Arthritis   . Hernia, umbilical   . Morbid obesity ([place])     Past Surgical History:  Procedure Laterality Date   . COLONOSCOPY WITH PROPOFOL N/A 06/13/2020   Procedure: COLONOSCOPY WITH PROPOFOL;  Surgeon: Carol Ada, MD;  Location: [facility];  Service: Endoscopy;  Laterality: N/A;  . HEMOSTASIS CLIP PLACEMENT  06/13/2020   Procedure: HEMOSTASIS CLIP PLACEMENT;  Surgeon: Carol Ada, MD;  Location: [facility];  Service: Endoscopy;;  . HERNIA REPAIR    . HYSTEROSCOPY WITH D & C N/A 08/26/2014   Procedure: DILATATION AND CURETTAGE /HYSTEROSCOPY and cervical repair of cervical laceration ;  Surgeon: Delsa Bern, MD;  Location: [facility];  Service: Gynecology;  Laterality: N/A;  . POLYPECTOMY  06/13/2020   Procedure: POLYPECTOMY;  Surgeon: Carol Ada, MD;  Location: [facility];  Service: Endoscopy;;  . UMBILICAL HERNIA REPAIR    . WISDOM TOOTH EXTRACTION      Allergies: Amoxicillin and Penicillins  Medications: Prior to Admission medications   Medication Sig Start Date End Date Taking? Authorizing Provider  acetaminophen (TYLENOL) 325 MG tablet Take 650 mg by mouth every 6 (six) hours as needed for moderate pain.    [provider]  amLODipine (NORVASC) 5 MG tablet Take 1 tablet (5 mg total) by mouth daily. 06/18/20 09/16/20  Bary Castilla, NP  Calcium Carb-Cholecalciferol (CALCIUM 500 +D PO) Take 1 tablet by mouth daily.    [provider]  Cholecalciferol (VITAMIN D) 2000 UNITS CAPS Take 2,000 Units by mouth daily.    [provider]  CINNAMON PO Take 1 capsule by mouth daily.    [provider]  diclofenac Sodium (VOLTAREN) 1 % GEL Apply 2 g topically daily as needed (pain).    [provider]  magnesium oxide (MAG-OX) 400 MG tablet  Take 400 mg by mouth daily.    [provider]  meloxicam (MOBIC) 15 MG tablet Take 1 tablet (15 mg total) by mouth daily. 07/31/19   Hyatt, Max T, DPM  Multiple Vitamin (MULTIVITAMIN) capsule Take 1 capsule by mouth daily.    [provider]  naproxen sodium (ALEVE) 220 MG tablet Take 220 mg by mouth  daily as needed (pain).    [provider]     Family History  Problem Relation Age of Onset  . Hypertension Mother     Social History   Socioeconomic History  . Marital status: Single    Spouse name: Not on file  . Number of children: Not on file  . Years of education: Not on file  . Highest education level: Not on file  Occupational History  . Not on file  Tobacco Use  . Smoking status: Never Smoker  . Smokeless tobacco: Never Used  Vaping Use  . Vaping Use: Never used  Substance and Sexual Activity  . Alcohol use: Yes    Alcohol/week: 1.0 standard drink    Types: 1 Cans of beer per week    Comment: rare  . Drug use: No  . Sexual activity: Not Currently  Other Topics Concern  . 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   Respiratory: Negative.   Cardiovascular: Negative.   Gastrointestinal: Negative.   Genitourinary: Negative.   Musculoskeletal: Negative.   Skin: Negative.   Neurological: Negative.   Hematological: Negative.     Review of Systems: A 12 point ROS discussed and pertinent positives are indicated in the HPI above.  All other systems are negative.  Physical Exam No direct physical exam was performed (except for noted visual exam findings with Video Visits).   Vital Signs: LMP 06/19/2020 (Exact Date)   Imaging: CT ABDOMEN PELVIS W CONTRAST  Result Date: 06/23/2020 CLINICAL DATA:  Preoperative planning for bulky uterine fibroids. EXAM: CT ABDOMEN AND PELVIS WITH CONTRAST TECHNIQUE: Multidetector CT imaging of the abdomen and pelvis was performed  using the standard protocol following bolus administration of intravenous contrast. CONTRAST:  170mL OMNIPAQUE IOHEXOL 300 MG/ML  SOLN COMPARISON:  04/12/2012 FINDINGS: Lower chest: 8 mm right middle lobe subpleural nodule identified on image 3 of series 4. Hepatobiliary: Asymmetric enlargement of the lateral segment left liver with heterogeneous parenchyma but no overt nodularity of hepatic contour. No focal abnormality in the liver on this study without intravenous contrast. There is no evidence for gallstones, gallbladder wall thickening, or pericholecystic fluid. No intrahepatic or extrahepatic biliary dilation. Pancreas: No focal mass lesion. No dilatation of the main duct. No intraparenchymal cyst. No peripancreatic edema. Spleen: No splenomegaly. No focal mass lesion. Adrenals/Urinary Tract: No adrenal nodule or mass. Kidneys unremarkable. No evidence for hydroureter. The urinary bladder appears normal for the degree of distention. Stomach/Bowel: Stomach is nondistended. Duodenum is normally positioned as is the ligament of Treitz. No small bowel wall thickening. No small bowel dilatation. The terminal ileum  is normal. The appendix is not well visualized, but there is no edema or inflammation in the region of the cecum. No gross colonic mass. No colonic wall thickening. Diverticular changes are noted in the left colon without evidence of diverticulitis. Vascular/Lymphatic: No abdominal aortic aneurysm. There is no gastrohepatic or hepatoduodenal ligament lymphadenopathy. No retroperitoneal or mesenteric lymphadenopathy. No pelvic sidewall lymphadenopathy. Reproductive: Uterus is markedly enlarged and lobular, consistent with bulky fibroid disease. Including fibroids, uterus measures 20 x 14 x 20 cm (2930 cc). There is no adnexal mass. Other: No intraperitoneal free fluid. Musculoskeletal: No worrisome lytic or sclerotic osseous abnormality. IMPRESSION: 1. Markedly enlarged, lobular uterus consistent with  bulky fibroid disease. 2. 8 mm right middle lobe subpleural nodule. Non-contrast chest CT at 6-12 months is recommended. If the nodule is stable at time of repeat CT, then future CT at 18-24 months (from today's scan) is considered optional for low-risk patients, but is recommended for high-risk patients. This recommendation follows the consensus statement: Guidelines for Management of Incidental Pulmonary Nodules Detected on CT Images: From the Fleischner Society 2017; Radiology 2017; 284:228-243. 3. Asymmetric enlargement of the lateral segment left liver with heterogeneous parenchyma but no overt nodularity of hepatic contour. Imaging features are nonspecific but may be related to early cirrhosis. Attention on follow-up recommended. 4. Left colonic diverticulosis without diverticulitis. Electronically Signed   By: Misty Stanley M.D.   On: 06/23/2020 11:10    Labs:  CBC: Recent Labs    06/16/20 1109  WBC 4.2  HGB 13.3  HCT 42.3  PLT 257    COAGS: No results for input(s): INR, APTT in the last 8760 hours.  BMP: Recent Labs    06/16/20 1109  NA 137  K 4.5  CL 101  CO2 21  GLUCOSE 77  BUN 14  CALCIUM 9.5  CREATININE 0.59     Assessment and Plan:  I spoke with Ms. Hott by phone.  We discussed management options for uterine fibroid disease including hysterectomy, uterine fibroid embolization and continued observation. She has discussed hysterectomy with Dr. Ihor Dow who has described a higher risk of surgical complication due to obesity. She is quite aware of fibroid embolization from different social media groups and online research.   Based on measurements I made personally on the prior CT studies, uterine volume on the 2014 study is approximately 800 mL and on the current study, 2600 mL.  Despite this large size and significant growth over the last 8 years, Ms. Flammia has few significant symptoms with only limited menorrhagia which is not causing significant anemia  and very few bulk symptoms currently consisting of periodic pain episodes.  I did tell her that an MRI of the pelvis will be necessary for further evaluation prior to determining whether she is a good candidate for embolization as the CT is not sufficient in depicting morphology of fibroids with respect to the endometrial cavity, enhancement pattern and shape of individual fibroids.  After MRI, I will discuss results with Ms. Grandville Silos.  Ms. Henle is likely getting to the point of developing more significant bulk symptoms in the near future given the substantial size of her uterus.  I did tell her that after MRI we will have some time in determining what the best route of care is for her.  I will get back in touch with Ms. Grandville Silos after her pelvic MRI and review imaging with her.  Thank you for this interesting consult.  I greatly enjoyed meeting [facility] and  look forward to participating in their care.  A copy of this report was sent to the requesting provider on this date.  Electronically Signed: Azzie Roup 07/10/2020, 3:51 PM     I spent a total of 30 Minutes in remote  clinical consultation, greater than 50% of which was counseling/coordinating care for uterine fibroids.    Visit type: Audio only (telephone). Audio (no video) only due to patient's lack of internet/smartphone capability. Alternative for in-person consultation at [facility], [address], [place], [place]. This visit type was conducted due to national recommendations for restrictions regarding the COVID-19 Pandemic (e.g. social distancing).  This format is felt to be most appropriate for this patient at this time.  All issues noted in this document were discussed and addressed.

## 2020-07-15 ENCOUNTER — Other Ambulatory Visit: Payer: Self-pay | Admitting: Interventional Radiology

## 2020-07-15 ENCOUNTER — Encounter: Payer: Self-pay | Admitting: General Practice

## 2020-07-15 DIAGNOSIS — D259 Leiomyoma of uterus, unspecified: Secondary | ICD-10-CM

## 2020-07-16 ENCOUNTER — Other Ambulatory Visit: Payer: Self-pay | Admitting: Podiatry

## 2020-07-17 ENCOUNTER — Ambulatory Visit (INDEPENDENT_AMBULATORY_CARE_PROVIDER_SITE_OTHER): Payer: No Typology Code available for payment source | Admitting: Nurse Practitioner

## 2020-07-17 ENCOUNTER — Other Ambulatory Visit: Payer: Self-pay

## 2020-07-17 VITALS — BP 138/80 | HR 80 | Temp 98.1°F | Ht 65.0 in | Wt 318.0 lb

## 2020-07-17 DIAGNOSIS — Z0001 Encounter for general adult medical examination with abnormal findings: Secondary | ICD-10-CM | POA: Diagnosis not present

## 2020-07-17 DIAGNOSIS — E559 Vitamin D deficiency, unspecified: Secondary | ICD-10-CM | POA: Diagnosis not present

## 2020-07-17 DIAGNOSIS — E66813 Obesity, class 3: Secondary | ICD-10-CM

## 2020-07-17 DIAGNOSIS — Z0000 Encounter for general adult medical examination without abnormal findings: Secondary | ICD-10-CM

## 2020-07-17 DIAGNOSIS — I10 Essential (primary) hypertension: Secondary | ICD-10-CM | POA: Diagnosis not present

## 2020-07-17 DIAGNOSIS — R9431 Abnormal electrocardiogram [ECG] [EKG]: Secondary | ICD-10-CM | POA: Diagnosis not present

## 2020-07-17 DIAGNOSIS — Z6841 Body Mass Index (BMI) 40.0 and over, adult: Secondary | ICD-10-CM

## 2020-07-17 DIAGNOSIS — R7303 Prediabetes: Secondary | ICD-10-CM

## 2020-07-17 DIAGNOSIS — Z23 Encounter for immunization: Secondary | ICD-10-CM | POA: Diagnosis not present

## 2020-07-17 LAB — POCT URINALYSIS DIPSTICK
Bilirubin, UA: NEGATIVE
Blood, UA: NEGATIVE
Glucose, UA: NEGATIVE
Ketones, UA: NEGATIVE
Leukocytes, UA: NEGATIVE
Nitrite, UA: NEGATIVE
Protein, UA: NEGATIVE
Spec Grav, UA: 1.015 (ref 1.010–1.025)
Urobilinogen, UA: 0.2 E.U./dL
pH, UA: 7 (ref 5.0–8.0)

## 2020-07-17 LAB — POCT UA - MICROALBUMIN
Creatinine, POC: 50 mg/dL
Microalbumin Ur, POC: 30 mg/L

## 2020-07-17 NOTE — Patient Instructions (Signed)
Health Maintenance, Female Adopting a healthy lifestyle and getting preventive care are important in promoting health and wellness. Ask your health care provider about:  The right schedule for you to have regular tests and exams.  Things you can do on your own to prevent diseases and keep yourself healthy. What should I know about diet, weight, and exercise? Eat a healthy diet  Eat a diet that includes plenty of vegetables, fruits, low-fat dairy products, and lean protein.  Do not eat a lot of foods that are high in solid fats, added sugars, or sodium.   Maintain a healthy weight Body mass index (BMI) is used to identify weight problems. It estimates body fat based on height and weight. Your health care provider can help determine your BMI and help you achieve or maintain a healthy weight. Get regular exercise Get regular exercise. This is one of the most important things you can do for your health. Most adults should:  Exercise for at least 150 minutes each week. The exercise should increase your heart rate and make you sweat (moderate-intensity exercise).  Do strengthening exercises at least twice a week. This is in addition to the moderate-intensity exercise.  Spend less time sitting. Even light physical activity can be beneficial. Watch cholesterol and blood lipids Have your blood tested for lipids and cholesterol at 47 years of age, then have this test every 5 years. Have your cholesterol levels checked more often if:  Your lipid or cholesterol levels are high.  You are older than 47 years of age.  You are at high risk for heart disease. What should I know about cancer screening? Depending on your health history and family history, you may need to have cancer screening at various ages. This may include screening for:  Breast cancer.  Cervical cancer.  Colorectal cancer.  Skin cancer.  Lung cancer. What should I know about heart disease, diabetes, and high blood  pressure? Blood pressure and heart disease  High blood pressure causes heart disease and increases the risk of stroke. This is more likely to develop in people who have high blood pressure readings, are of African descent, or are overweight.  Have your blood pressure checked: ? Every 3-5 years if you are 18-39 years of age. ? Every year if you are 40 years old or older. Diabetes Have regular diabetes screenings. This checks your fasting blood sugar level. Have the screening done:  Once every three years after age 40 if you are at a normal weight and have a low risk for diabetes.  More often and at a younger age if you are overweight or have a high risk for diabetes. What should I know about preventing infection? Hepatitis B If you have a higher risk for hepatitis B, you should be screened for this virus. Talk with your health care provider to find out if you are at risk for hepatitis B infection. Hepatitis C Testing is recommended for:  Everyone born from 1945 through 1965.  Anyone with known risk factors for hepatitis C. Sexually transmitted infections (STIs)  Get screened for STIs, including gonorrhea and chlamydia, if: ? You are sexually active and are younger than 47 years of age. ? You are older than 47 years of age and your health care provider tells you that you are at risk for this type of infection. ? Your sexual activity has changed since you were last screened, and you are at increased risk for chlamydia or gonorrhea. Ask your health care provider   if you are at risk.  Ask your health care provider about whether you are at high risk for HIV. Your health care provider may recommend a prescription medicine to help prevent HIV infection. If you choose to take medicine to prevent HIV, you should first get tested for HIV. You should then be tested every 3 months for as long as you are taking the medicine. Pregnancy  If you are about to stop having your period (premenopausal) and  you may become pregnant, seek counseling before you get pregnant.  Take 400 to 800 micrograms (mcg) of folic acid every day if you become pregnant.  Ask for birth control (contraception) if you want to prevent pregnancy. Osteoporosis and menopause Osteoporosis is a disease in which the bones lose minerals and strength with aging. This can result in bone fractures. If you are 65 years old or older, or if you are at risk for osteoporosis and fractures, ask your health care provider if you should:  Be screened for bone loss.  Take a calcium or vitamin D supplement to lower your risk of fractures.  Be given hormone replacement therapy (HRT) to treat symptoms of menopause. Follow these instructions at home: Lifestyle  Do not use any products that contain nicotine or tobacco, such as cigarettes, e-cigarettes, and chewing tobacco. If you need help quitting, ask your health care provider.  Do not use street drugs.  Do not share needles.  Ask your health care provider for help if you need support or information about quitting drugs. Alcohol use  Do not drink alcohol if: ? Your health care provider tells you not to drink. ? You are pregnant, may be pregnant, or are planning to become pregnant.  If you drink alcohol: ? Limit how much you use to 0-1 drink a day. ? Limit intake if you are breastfeeding.  Be aware of how much alcohol is in your drink. In the [place] one drink equals one 12 oz bottle of beer (355 mL), one 5 oz glass of wine (148 mL), or one 1 oz glass of hard liquor (44 mL). General instructions  Schedule regular health, dental, and eye exams.  Stay current with your vaccines.  Tell your health care provider if: ? You often feel depressed. ? You have ever been abused or do not feel safe at home. Summary  Adopting a healthy lifestyle and getting preventive care are important in promoting health and wellness.  Follow your health care provider's instructions about healthy  diet, exercising, and getting tested or screened for diseases.  Follow your health care provider's instructions on monitoring your cholesterol and blood pressure. This information is not intended to replace advice given to you by your health care provider. Make sure you discuss any questions you have with your health care provider. Document Revised: 03/22/2018 Document Reviewed: 03/22/2018 Elsevier Patient Education  2021 [employer] Inc.  

## 2020-07-17 NOTE — Progress Notes (Signed)
I,Tianna Badgett,acting as a Education administrator for [employer], NP.,have documented all relevant documentation on the behalf of [employer], NP,as directed by  Bary Castilla, NP while in the presence of Bary Castilla, NP.  This visit occurred during the SARS-CoV-2 public health emergency.  Safety protocols were in place, including screening questions prior to the visit, additional usage of staff PPE, and extensive cleaning of exam room while observing appropriate contact time as indicated for disinfecting solutions.  Subjective:     Patient ID: Amanda Paul , female    DOB: [birth year] , 45 y.o.   MRN: [id number]   Chief Complaint  Patient presents with  . Annual Exam    HPI  Patient is here for annual physical. She has no concerns at this time. She does see Dr Ihor Dow for her GYN care. Paps smear last month and mammogram in oct of 2022. She works at [employer]. She sees the dentist at dental work for routine check ups. She also has a eye doctor that she sees. She takes her medication as prescribed.  Diet: Not doing a good job but she is trying. She is trying to avoid salt.  Exercise: at work. She tries to walk at home.   Wt Readings from Last 3 Encounters: 07/17/20 : (!) 318 lb (144.2 kg) 06/30/20 : (!) 307 lb (139.3 kg) 06/18/20 : (!) 307 lb 12.8 oz (139.6 kg)     Past Medical History:  Diagnosis Date  . Allergy   . Arthritis   . Hernia, umbilical   . Morbid obesity ([place])      Family History  Problem Relation Age of Onset  . Hypertension Mother      Current Outpatient Medications:  .  acetaminophen (TYLENOL) 325 MG tablet, Take 650 mg by mouth every 6 (six) hours as needed for moderate pain., Disp: , Rfl:  .  amLODipine (NORVASC) 5 MG tablet, Take 1 tablet (5 mg total) by mouth daily., Disp: 30 tablet, Rfl: 2 .  Calcium Carb-Cholecalciferol (CALCIUM 500 +D PO), Take 1 tablet by mouth daily., Disp: , Rfl:  .  Cholecalciferol (VITAMIN D) 2000 UNITS  CAPS, Take 2,000 Units by mouth daily., Disp: , Rfl:  .  CINNAMON PO, Take 1 capsule by mouth daily., Disp: , Rfl:  .  magnesium oxide (MAG-OX) 400 MG tablet, Take 400 mg by mouth daily., Disp: , Rfl:  .  meloxicam (MOBIC) 15 MG tablet, TAKE 1 TABLET(15 MG) BY MOUTH DAILY, Disp: 30 tablet, Rfl: 0 .  meloxicam (MOBIC) 15 MG tablet, TAKE 1 TABLET(15 MG) BY MOUTH DAILY, Disp: 30 tablet, Rfl: 3 .  Multiple Vitamin (MULTIVITAMIN) capsule, Take 1 capsule by mouth daily., Disp: , Rfl:    Allergies  Allergen Reactions  . Amoxicillin Hives, Itching and Swelling    Swollen eyes and throat.  Marland Kitchen Penicillins Hives and Swelling     Review of Systems  Constitutional: Negative.  Negative for chills and fever.  HENT: Negative.  Negative for ear discharge, postnasal drip and rhinorrhea.   Eyes: Negative.  Negative for pain.  Respiratory: Negative.  Negative for cough, shortness of breath and wheezing.   Cardiovascular: Negative.  Negative for chest pain and palpitations.  Gastrointestinal: Negative.   Endocrine: Negative.  Negative for cold intolerance and heat intolerance.  Genitourinary: Negative.  Negative for flank pain.  Musculoskeletal: Negative.  Negative for back pain and myalgias.  Skin: Negative.   Allergic/Immunologic: Negative.   Neurological: Negative.  Negative for dizziness, weakness, light-headedness and headaches.  Hematological: Negative.   Psychiatric/Behavioral: Negative.   All other systems reviewed and are negative.    Today's Vitals   07/17/20 1015  BP: 138/80  Pulse: 80  Temp: 98.1 F (36.7 C)  TempSrc: Oral  Weight: (!) 318 lb (144.2 kg)  Height: 5\' 5"  (1.651 m)   Body mass index is 52.92 kg/m.  Wt Readings from Last 3 Encounters:  07/17/20 (!) 318 lb (144.2 kg)  06/30/20 (!) 307 lb (139.3 kg)  06/18/20 (!) 307 lb 12.8 oz (139.6 kg)    Objective:  Physical Exam Vitals and nursing note reviewed.  Constitutional:      Appearance: Normal appearance. She is  obese.  HENT:     Head: Normocephalic and atraumatic.     Right Ear: Tympanic membrane, ear canal and external ear normal.     Left Ear: Tympanic membrane, ear canal and external ear normal.     Nose: Nose normal.     Mouth/Throat:     Mouth: Mucous membranes are moist.     Pharynx: Oropharynx is clear.  Eyes:     Extraocular Movements: Extraocular movements intact.     Conjunctiva/sclera: Conjunctivae normal.     Pupils: Pupils are equal, round, and reactive to light.  Cardiovascular:     Rate and Rhythm: Normal rate and regular rhythm.     Pulses: Normal pulses.     Heart sounds: Normal heart sounds. No murmur heard.   Pulmonary:     Effort: Pulmonary effort is normal. No respiratory distress.     Breath sounds: Normal breath sounds. No wheezing.  Abdominal:     General: Abdomen is flat. Bowel sounds are normal.     Palpations: Abdomen is soft.     Tenderness: There is no abdominal tenderness.  Genitourinary:    Comments: deferred Musculoskeletal:        General: Normal range of motion.     Cervical back: Normal range of motion and neck supple.  Skin:    General: Skin is warm and dry.     Capillary Refill: Capillary refill takes less than 2 seconds.  Neurological:     General: No focal deficit present.     Mental Status: She is alert and oriented to person, place, and time.  Psychiatric:        Mood and Affect: Mood normal.        Behavior: Behavior normal.        Thought Content: Thought content normal.        Judgment: Judgment normal.         Assessment And Plan:     1. Encounter for annual physical exam --Patient is here for their annual physical exam and we discussed any changes to medication and medical history.  -Behavior modification was discussed as well as diet and exercise history  -Patient will continue to exercise regularly and modify their diet.  -Recommendation for yearly physical annuals, immunization and screenings including mammogram and  colonoscopy were discussed with the patient.  -Recommended intake of multivitamin, vitamin D and calcium.  -Individualized advise was given to the patient pertaining to their own health history in regards to diet, exercise, medical condition and referrals.  - CBC - CMP14+EGFR - Lipid panel - Hepatitis C antibody - HIV Antibody (routine testing w rflx)  2. Primary hypertension -Chronic, stable  -Continue current meds  -Limit the intake of processed foods and salt intake. You should increase your intake of green vegetables and fruits. Limit the use  of alcohol. Limit fast foods and fried foods. Avoid high fatty saturated and trans fat foods. Keep yourself hydrated with drinking water. Avoid red meats. Eat lean meats instead. Exercise for atleast 30-45 min for atleast 4-5 times a week.  - EKG 12-Lead - POCT Urinalysis Dipstick (81002) - POCT UA - Microalbumin  3. Prediabetes -Educated patient of the importance of a sugar free diet and drinks  - Hemoglobin A1c  4. Vitamin D deficiency -will check level today and supplement  - Vitamin D (25 hydroxy)  5. Need for Tdap vaccination -Tdap vaccine given today   6. Abnormal EKG -EKG shows anterior infract  - Ambulatory referral to Cardiology  7. Class 3 severe obesity due to excess calories without serious comorbidity with body mass index (BMI) of 50.0 to 59.9 in adult [facility]) -Educated patient importance of weight loss and encouraged her to exercise for atleast 30-45 min. Daily while focusing on a healthy diet consisting of low carbs and low sugar. Increase intake of fish and fiber. Decrease intake of fatty and fried foods.   -Staying healthy and adopting a healthy lifestyle for your overall health is important. You should eat 7 or more servings of fruits and vegetables per day. You should drink plenty of water to keep yourself hydrated and your kidneys healthy. This includes about 65-80+ fluid ounces of water. Limit your intake of animal fats  especially for elevated cholesterol. Avoid highly processed food and limit your salt intake if you have hypertension. Avoid foods high in saturated/Trans fats. Along with a healthy diet it is also very important to maintain time for yourself to maintain a healthy mental health with low stress levels. You should get atleast 150 min of moderate intensity exercise weekly for a healthy heart. Along with eating right and exercising, aim for at least 7-9 hours of sleep daily.  Eat more whole grains which includes barley, wheat berries, oats, brown rice and whole wheat pasta. Use healthy plant oils which include olive, soy, corn, sunflower and peanut. Limit your caffeine and sugary drinks. Limit your intake of fast foods. Limit milk and dairy products to one or two daily servings.    She is encouraged to strive for BMI less than 30 to decrease cardiac risk. Advised to aim for at least 150 minutes of exercise per week.   Patient was given opportunity to ask questions. Patient verbalized understanding of the plan and was able to repeat key elements of the plan. All questions were answered to their satisfaction.  Shaman Muscarella DNP    I, Bary Castilla DNP  have reviewed all documentation for this visit. The documentation on 07/17/20 or the exam, diagnosis, procedures, and orders are all accurate and complete.   IF YOU HAVE BEEN REFERRED TO A SPECIALIST, IT MAY TAKE 1-2 WEEKS TO SCHEDULE/PROCESS THE REFERRAL. IF YOU HAVE NOT HEARD FROM US/SPECIALIST IN TWO WEEKS, PLEASE GIVE [place] A CALL AT [phone].   THE PATIENT IS ENCOURAGED TO PRACTICE SOCIAL DISTANCING DUE TO THE COVID-19 PANDEMIC.

## 2020-07-18 LAB — HEMOGLOBIN A1C
Est. average glucose Bld gHb Est-mCnc: 120 mg/dL
Hgb A1c MFr Bld: 5.8 % — ABNORMAL HIGH (ref 4.8–5.6)

## 2020-07-18 LAB — CMP14+EGFR
ALT: 16 IU/L (ref 0–32)
AST: 18 IU/L (ref 0–40)
Albumin/Globulin Ratio: 1.5 (ref 1.2–2.2)
Albumin: 4.5 g/dL (ref 3.8–4.8)
Alkaline Phosphatase: 56 IU/L (ref 44–121)
BUN/Creatinine Ratio: 18 (ref 9–23)
BUN: 11 mg/dL (ref 6–24)
Bilirubin Total: 0.3 mg/dL (ref 0.0–1.2)
CO2: 24 mmol/L (ref 20–29)
Calcium: 9.5 mg/dL (ref 8.7–10.2)
Chloride: 100 mmol/L (ref 96–106)
Creatinine, Ser: 0.6 mg/dL (ref 0.57–1.00)
Globulin, Total: 3.1 g/dL (ref 1.5–4.5)
Glucose: 81 mg/dL (ref 65–99)
Potassium: 4.4 mmol/L (ref 3.5–5.2)
Sodium: 141 mmol/L (ref 134–144)
Total Protein: 7.6 g/dL (ref 6.0–8.5)
eGFR: 111 mL/min/{1.73_m2} (ref >59)

## 2020-07-18 LAB — HEPATITIS C ANTIBODY: Hep C Virus Ab: <0.1 s/co ratio (ref 0.0–0.9)

## 2020-07-18 LAB — CBC
Hematocrit: 41.3 % (ref 34.0–46.6)
Hemoglobin: 13.5 g/dL (ref 11.1–15.9)
MCH: 28.5 pg (ref 26.6–33.0)
MCHC: 32.7 g/dL (ref 31.5–35.7)
MCV: 87 fL (ref 79–97)
Platelets: 275 10*3/uL (ref 150–450)
RBC: 4.74 x10E6/uL (ref 3.77–5.28)
RDW: 12.6 % (ref 11.7–15.4)
WBC: 5 10*3/uL (ref 3.4–10.8)

## 2020-07-18 LAB — LIPID PANEL
Chol/HDL Ratio: 2.8 ratio (ref 0.0–4.4)
Cholesterol, Total: 229 mg/dL — ABNORMAL HIGH (ref 100–199)
HDL: 81 mg/dL (ref >39)
LDL Chol Calc (NIH): 139 mg/dL — ABNORMAL HIGH (ref 0–99)
Triglycerides: 52 mg/dL (ref 0–149)
VLDL Cholesterol Cal: 9 mg/dL (ref 5–40)

## 2020-07-18 LAB — HIV ANTIBODY (ROUTINE TESTING W REFLEX): HIV Screen 4th Generation wRfx: NONREACTIVE

## 2020-07-18 LAB — VITAMIN D 25 HYDROXY (VIT D DEFICIENCY, FRACTURES): Vit D, 25-Hydroxy: 18.6 ng/mL — ABNORMAL LOW (ref 30.0–100.0)

## 2020-07-22 ENCOUNTER — Other Ambulatory Visit: Payer: Self-pay | Admitting: Nurse Practitioner

## 2020-07-22 DIAGNOSIS — E559 Vitamin D deficiency, unspecified: Secondary | ICD-10-CM

## 2020-07-22 MED ORDER — VITAMIN D (ERGOCALCIFEROL) 1.25 MG (50000 UNIT) PO CAPS: 50000.0000 [IU] | ORAL_CAPSULE | ORAL | 0 refills | Status: DC

## 2020-07-23 ENCOUNTER — Other Ambulatory Visit: Payer: Self-pay

## 2020-07-23 ENCOUNTER — Ambulatory Visit (HOSPITAL_COMMUNITY)
Admission: RE | Admit: 2020-07-23 | Discharge: 2020-07-23 | Disposition: A | Discharge status: Home / Self Care | Payer: No Typology Code available for payment source | Source: Ambulatory Visit | Attending: Interventional Radiology | Admitting: Interventional Radiology

## 2020-07-23 DIAGNOSIS — D259 Leiomyoma of uterus, unspecified: Secondary | ICD-10-CM | POA: Diagnosis present

## 2020-07-23 MED ORDER — GADOBUTROL 1 MMOL/ML IV SOLN
10.0000 mL | Freq: Once | INTRAVENOUS | Status: AC | PRN
  Administered 2020-07-23: 10 mL via INTRAVENOUS

## 2020-07-28 ENCOUNTER — Ambulatory Visit (INDEPENDENT_AMBULATORY_CARE_PROVIDER_SITE_OTHER): Payer: No Typology Code available for payment source | Admitting: Cardiovascular Disease

## 2020-07-28 ENCOUNTER — Encounter: Payer: Self-pay | Admitting: Cardiovascular Disease

## 2020-07-28 ENCOUNTER — Other Ambulatory Visit: Payer: Self-pay

## 2020-07-28 DIAGNOSIS — I10 Essential (primary) hypertension: Secondary | ICD-10-CM

## 2020-07-28 DIAGNOSIS — R9431 Abnormal electrocardiogram [ECG] [EKG]: Secondary | ICD-10-CM

## 2020-07-28 NOTE — Progress Notes (Signed)
Cardiology Office Note    Date:  07/31/2020   ID:  Amanda Paul, Amanda Paul March 21, 1976, MRN [id number]  PCP:  Bary Castilla, NP  Cardiologist:  Shelva Majestic, MD   New cardiology evaluation  History of Present Illness:  Amanda Paul is a 45 y.o. female who was referred through the courtesy of Ramandeep Ghumman,NP for evaluation of a possible abnormal ECG.  Amanda Paul denies any known history of cardiac disease.  The patient has a history of super morbid obesity.  Her peak weight occurred approximately 8 years ago when she weighed 380 pounds.  She subsequently lost weight down to 290 297 pounds but recently has regained some of the weight back with her current weight in the range of 316 pounds.  She believes when she graduated from high school her weight was 215 pounds.  She was recently evaluated for an annual physical exam.  Apparently, the EKG that was done was reported as showing an anterior infarct due to poor R progression and QS complex in V1 V2.  She denies any chest pain.  In March 2022 she had recently been evaluated and apparently was started on amlodipine 5 mg for hypertension.  Time she states her blood pressure was 160/92.  On therapy the following month her blood pressure had improved to 130/80.  She has been trying to alter her diet and restrict sodium intake.  She has a history of uterine fibroids and may ultimately require uterine artery embolization.  Presently she denies any change in exercise tolerance, shortness of breath, palpitations, presyncope or syncope.  She presents for evaluation.    Past Medical History:  Diagnosis Date  . Allergy   . Arthritis   . Hernia, umbilical   . Morbid obesity ([place])     Past Surgical History:  Procedure Laterality Date  . COLONOSCOPY WITH PROPOFOL N/A 06/13/2020   Procedure: COLONOSCOPY WITH PROPOFOL;  Surgeon: Carol Ada, MD;  Location: [facility];  Service: Endoscopy;  Laterality: N/A;  . HEMOSTASIS  CLIP PLACEMENT  06/13/2020   Procedure: HEMOSTASIS CLIP PLACEMENT;  Surgeon: Carol Ada, MD;  Location: [facility];  Service: Endoscopy;;  . HERNIA REPAIR    . HYSTEROSCOPY WITH D & C N/A 08/26/2014   Procedure: DILATATION AND CURETTAGE /HYSTEROSCOPY and cervical repair of cervical laceration ;  Surgeon: Delsa Bern, MD;  Location: [facility];  Service: Gynecology;  Laterality: N/A;  . IR RADIOLOGIST EVAL & MGMT  07/10/2020  . IR RADIOLOGIST EVAL & MGMT  07/31/2020  . POLYPECTOMY  06/13/2020   Procedure: POLYPECTOMY;  Surgeon: Carol Ada, MD;  Location: [facility];  Service: Endoscopy;;  . UMBILICAL HERNIA REPAIR    . WISDOM TOOTH EXTRACTION      Current Medications: Outpatient Medications Prior to Visit  Medication Sig Dispense Refill  . acetaminophen (TYLENOL) 325 MG tablet Take 650 mg by mouth every 6 (six) hours as needed for moderate pain.    Marland Kitchen amLODipine (NORVASC) 5 MG tablet Take 1 tablet (5 mg total) by mouth daily. 30 tablet 2  . Calcium Carb-Cholecalciferol (CALCIUM 500 +D PO) Take 1 tablet by mouth daily.    . Cholecalciferol (VITAMIN D) 2000 UNITS CAPS Take 2,000 Units by mouth daily.    Marland Kitchen CINNAMON PO Take 1 capsule by mouth daily.    . magnesium oxide (MAG-OX) 400 MG tablet Take 400 mg by mouth daily.    . meloxicam (MOBIC) 15 MG tablet TAKE 1 TABLET(15 MG) BY MOUTH DAILY 30 tablet 0  .  meloxicam (MOBIC) 15 MG tablet TAKE 1 TABLET(15 MG) BY MOUTH DAILY 30 tablet 3  . Multiple Vitamin (MULTIVITAMIN) capsule Take 1 capsule by mouth daily.    . Vitamin D, Ergocalciferol, (DRISDOL) 1.25 MG (50000 UNIT) CAPS capsule Take 1 capsule (50,000 Units total) by mouth every 7 (seven) days. 12 capsule 0   No facility-administered medications prior to visit.     Allergies:   Amoxicillin and Penicillins   Social History   Socioeconomic History  . Marital status: Single    Spouse name: Not on file  . Number of children: Not on file  . Years of education: Not on file  . Highest  education level: Not on file  Occupational History  . Not on file  Tobacco Use  . Smoking status: Never Smoker  . Smokeless tobacco: Never Used  Vaping Use  . Vaping Use: Never used  Substance and Sexual Activity  . Alcohol use: Yes    Alcohol/week: 1.0 standard drink    Types: 1 Cans of beer per week    Comment: rare  . Drug use: No  . Sexual activity: Not Currently  Other Topics Concern  . 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  She is being [place] since 1993.  She is single and has no children.  She did attend college for short time.  She currently works for [employer].  She rarely drinks a glass of wine.  Only she had exercised about 4 times per week but now that she is working she has not been exercising.  Family History: Family history is notable that her mother is 74 years old and has asthma, COPD and hypertension.  Father is 60.  Is a brother who is her father's son but not her mother's.  ROS General: Negative; No fevers, chills, or night sweats; super morbid obesity HEENT: Negative; No changes in vision or hearing, sinus congestion, difficulty swallowing Pulmonary: Negative; No cough, wheezing, shortness of breath, hemoptysis Cardiovascular: Negative; No chest pain, presyncope, syncope, palpitations GI: Negative; No nausea, vomiting, diarrhea, or abdominal pain GU: Uterine fibroids Musculoskeletal: Negative; no myalgias, joint pain, or weakness Hematologic/Oncology: Negative; no easy bruising, bleeding Endocrine: Negative; no heat/cold intolerance; no diabetes Neuro:  Negative; no changes in balance, headaches Skin: Negative; No rashes or skin lesions Psychiatric: Negative; No behavioral problems, depression Sleep: Negative; No snoring, daytime sleepiness, hypersomnolence, bruxism, restless legs, hypnogognic hallucinations, no cataplexy Other comprehensive 14 point system review is negative.   PHYSICAL EXAM:   VS:  BP (!) 142/96 (BP Location: Left Arm, Patient Position: Sitting, Cuff Size: Large)   Pulse 72   Ht 5' 5"  (1.651 m)   Wt (!) 316 lb 9.6 oz (143.6 kg)   SpO2 94%   BMI 52.68 kg/m     Repeat blood pressure by me was 140/88.  Wt Readings from Last 3 Encounters:  07/28/20 (!) 316 lb 9.6 oz (143.6 kg)  07/17/20 (!) 318 lb (144.2 kg)  06/30/20 Marland Kitchen)  307 lb (139.3 kg)    General: Alert, oriented, no distress.  Skin: normal turgor, no rashes, warm and dry HEENT: Normocephalic, atraumatic. Pupils equal round and reactive to light; sclera anicteric; extraocular muscles intact;  Nose without nasal septal hypertrophy Mouth/Parynx benign; Mallinpatti scale 3 Neck: Thick neck no JVD, no carotid bruits; normal carotid upstroke Lungs: clear to ausculatation and percussion; no wheezing or rales Chest wall: without tenderness to palpitation Heart: PMI not displaced, RRR, s1 s2 normal, 1/6 systolic murmur, no diastolic murmur, no rubs, gallops, thrills, or heaves Abdomen: Significant central adiposity; soft, nontender; no hepatosplenomehaly, BS+; abdominal aorta nontender and not dilated by palpation. Back: no CVA tenderness Pulses 2+ Musculoskeletal: full range of motion, normal strength, no joint deformities Extremities: no clubbing cyanosis or edema, Homan's sign negative  Neurologic: grossly nonfocal; Cranial nerves grossly wnl Psychologic: Normal mood and affect   Studies/Labs Reviewed:   EKG:  EKG is ordered today. ECG (independently read by me): NSR at 72; QS V1-2, , nonpecific T wave abnormality III, aVF. No ectopy, normal  intervals  Recent Labs: BMP Latest Ref Rng & Units 07/17/2020 06/16/2020 04/11/2012  Glucose 65 - 99 mg/dL 81 77 94  BUN 6 - 24 mg/dL 11 14 8   Creatinine 0.57 - 1.00 mg/dL 0.60 0.59 0.51  BUN/Creat Ratio 9 - 23 18 24(H) -  Sodium 134 - 144 mmol/L 141 137 137  Potassium 3.5 - 5.2 mmol/L 4.4 4.5 3.2(L)  Chloride 96 - 106 mmol/L 100 101 97  CO2 20 - 29 mmol/L 24 21 27   Calcium 8.7 - 10.2 mg/dL 9.5 9.5 9.8     Hepatic Function Latest Ref Rng & Units 07/17/2020 04/11/2012  Total Protein 6.0 - 8.5 g/dL 7.6 8.2  Albumin 3.8 - 4.8 g/dL 4.5 3.8  AST 0 - 40 IU/L 18 24  ALT 0 - 32 IU/L 16 22  Alk Phosphatase 44 - 121 IU/L 56 52  Total Bilirubin 0.0 - 1.2 mg/dL 0.3 0.3    CBC Latest Ref Rng & Units 07/17/2020 06/16/2020 09/30/2015  WBC 3.4 - 10.8 x10E3/uL 5.0 4.2 5.9  Hemoglobin 11.1 - 15.9 g/dL 13.5 13.3 12.0(A)  Hematocrit 34.0 - 46.6 % 41.3 42.3 35.2(A)  Platelets 150 - 450 x10E3/uL 275 257 -   Lab Results  Component Value Date   MCV 87 07/17/2020   MCV 90 06/16/2020   MCV 80.9 09/30/2015   Lab Results  Component Value Date   TSH 1.57 09/30/2015   Lab Results  Component Value Date   HGBA1C 5.8 (H) 07/17/2020     BNP No results found for: BNP  ProBNP No results found for: PROBNP   Lipid Panel     Component Value Date/Time   CHOL 229 (H) 07/17/2020 1127   TRIG 52 07/17/2020 1127   HDL 81 07/17/2020 1127   CHOLHDL 2.8 07/17/2020 1127   CHOLHDL 2.7 09/30/2015 0910   VLDL 11 09/30/2015 0910   LDLCALC 139 (H) 07/17/2020 1127   LABVLDL 9 07/17/2020 1127     RADIOLOGY: MR PELVIS W WO CONTRAST  Result Date: 07/24/2020 CLINICAL DATA:  Pre uterine artery embolization. Large uterine leiomyoma. EXAM: MRI PELVIS WITHOUT AND WITH CONTRAST TECHNIQUE: Multiplanar multisequence MR imaging of the pelvis was performed both before and after administration of intravenous contrast. CONTRAST:  62m GADAVIST GADOBUTROL 1 MMOL/ML IV SOLN COMPARISON:  CT 06/23/2020 FINDINGS: Urinary Tract:  The partially imaged kidneys appear normal. Ureters and bladder normal. Bowel:  Limited view of the small bowel:  Unremarkable. Vascular/Lymphatic: No vascular anomaly. Iliac arteries and veins appear normal. Reproductive: Uterus: Measures 19.0 x 10.9 x 19.3 cm (volume = 2090 cm^3). Estimated volume = 2090 cc. The fundus and body of the uterus is markedly expanded by multiple intramural leiomyoma. The endometrium is poorly defined. No enlarged junctional zone to suggest adenomyosis. The leiomyoma have heterogeneous low signal intensity on T2 weighted imaging typical of benign leiomyoma. Largest intramural leiomyoma is within the mid LEFT uterine body measuring 12.2 by 10.9 cm (image 20/series 4. More superior leiomyoma in the RIGHT aspect the uterine fundus measures 6.5 x 6.3 cm. Third leiomyoma in the posterior RIGHT uterine body measures 4.5 by 3.4 cm. Intracavitary fibroids:  No submucosal leiomyoma. Pedunculated fibroids: No exophytic or pedunculated leiomyoma. Fibroid contrast enhancement: Leiomyoma demonstrate uniform arterial enhancement (series 19 and series 23) Right ovary: Difficult to define. Favor RIGHT ovarian tissue measuring 2.9 by 1.6 cm on axial image 12, series 4/T2 weighted. Left ovary:  Not confidently identified. Other:  No free fluid. Musculoskeletal:  Unremarkable IMPRESSION: 1. Multiple large round uniform enhancing intramural leiomyoma. 2. Marked expansion of the uterine body and fundus as above. 3. Ovaries not well identified. Electronically Signed   By: Suzy Bouchard M.D.   On: 07/24/2020 16:34   IR Radiologist Eval & Mgmt  Result Date: 07/31/2020 Please refer to notes tab for details about interventional procedure. (Op Note)  IR Radiologist Eval & Mgmt  Result Date: 07/10/2020 Please refer to notes tab for details about interventional procedure. (Op Note)    Additional studies/ records that were reviewed today include:  I reviewed the records from Bary Castilla,  NP  ASSESSMENT:    1. Abnormal EKG   2. Morbid obesity ([place])   3. Hypertension, unspecified type    PLAN:  Amanda Paul is a very pleasant 45 year old female who has a longstanding history of super morbid obesity and previously had a peak weight of 380 pounds.  Present weight today is 316 pounds corresponding to a BMI of 52.7.  She denies any history of known coronary disease, chest pain, PND orthopnea, presyncope, syncope, or dysrhythmia.  In March 2022 she was started on amlodipine 5 mg for blood pressure elevation at 160/92.  Subsequently, her blood pressure had improved.  Her blood pressure today upon initial presentation was elevated at 142/96 and on repeat was 148/88.  Her previous ECG has shown QS complex in V1 V2 but I suspect this may be contributed by her significant body habitus rather than as result of prior anteroseptal myocardial infarction.  I am recommending that she undergo a 2D echo Doppler study to assess wall motion, valvular architecture as well as LVH.  We discussed the importance of diet and sodium restriction.  Review of laboratory obtained by her primary provider has shown elevated cholesterol at 229 with LDL of 139 triglycerides 52 and HDL was excellent at 81.  Hemoglobin A1c was 5.8.  Presently there is no significant edema.  She states her recent blood pressures at home have been in the 130 range.  She tells me she has a follow-up appointment with her primary care provider.  I have reviewed her lipid studies and if these continue to be elevated lipid-lowering therapy should strongly be considered.  I discussed the importance of weight loss, and increased exercise.  We discussed exercising ideally at least 5 days/week for up to 30 minutes at a time or at least 150 minutes/week if at all possible.  She believes she is sleeping  well and denies any concerns for obstructive sleep apnea.  I calculated an Epworth Sleepiness Scale score in the office today and this endorsed at 4  arguing against daytime sleepiness.  I will contact her regarding her echo Doppler assessment and further assessment will be done depending upon the results.  Medication Adjustments/Labs and Tests Ordered: Current medicines are reviewed at length with the patient today.  Concerns regarding medicines are outlined above.  Medication changes, Labs and Tests ordered today are listed in the Patient Instructions below. Patient Instructions  Medication Instructions:  Your physician recommends that you continue on your current medications as directed. Please refer to the Current Medication list given to you today.  Testing/Procedures: Your physician has requested that you have an echocardiogram. Echocardiography is a painless test that uses sound waves to create images of your heart. It provides your doctor with information about the size and shape of your heart and how well your heart's chambers and valves are working. This procedure takes approximately one hour. There are no restrictions for this procedure.  This will be done at our [facility] location:  [place]: At [facility], you and your health needs are our priority.  As part of our continuing mission to provide you with exceptional heart care, we have created designated Provider Care Teams.  These Care Teams include your primary Cardiologist (physician) and Advanced Practice Providers (APPs -  Physician Assistants and Nurse Practitioners) who all work together to provide you with the care you need, when you need it.  We recommend signing up for the patient portal called "MyChart".  Sign up information is provided on this After Visit Summary.  MyChart is used to connect with patients for Virtual Visits (Telemedicine).  Patients are able to view lab/test results, encounter notes, upcoming appointments, etc.  Non-urgent messages can be sent to your provider as well.   To learn more about what you can do with  MyChart, go to [email or web address].    Your next appointment:   AS NEEDED with Dr. Claiborne Billings (unless testing is abnormal)     Signed, Shelva Majestic, MD  07/31/2020 6:37 PM    [facility] [address] [place], [place], [place]  [address] Phone: [phone]

## 2020-07-28 NOTE — Patient Instructions (Signed)
Medication Instructions:  Your physician recommends that you continue on your current medications as directed. Please refer to the Current Medication list given to you today.  Testing/Procedures: Your physician has requested that you have an echocardiogram. Echocardiography is a painless test that uses sound waves to create images of your heart. It provides your doctor with information about the size and shape of your heart and how well your heart's chambers and valves are working. This procedure takes approximately one hour. There are no restrictions for this procedure.  This will be done at our [facility] location:  [place]: At [facility], you and your health needs are our priority.  As part of our continuing mission to provide you with exceptional heart care, we have created designated Provider Care Teams.  These Care Teams include your primary Cardiologist (physician) and Advanced Practice Providers (APPs -  Physician Assistants and Nurse Practitioners) who all work together to provide you with the care you need, when you need it.  We recommend signing up for the patient portal called "MyChart".  Sign up information is provided on this After Visit Summary.  MyChart is used to connect with patients for Virtual Visits (Telemedicine).  Patients are able to view lab/test results, encounter notes, upcoming appointments, etc.  Non-urgent messages can be sent to your provider as well.   To learn more about what you can do with MyChart, go to [email or web address].    Your next appointment:   AS NEEDED with Dr. Claiborne Billings (unless testing is abnormal)

## 2020-07-31 ENCOUNTER — Ambulatory Visit
Admission: RE | Admit: 2020-07-31 | Discharge: 2020-07-31 | Disposition: A | Discharge status: Home / Self Care | Payer: No Typology Code available for payment source | Source: Ambulatory Visit | Attending: Interventional Radiology | Admitting: Interventional Radiology

## 2020-07-31 ENCOUNTER — Other Ambulatory Visit: Payer: Self-pay

## 2020-07-31 DIAGNOSIS — D259 Leiomyoma of uterus, unspecified: Secondary | ICD-10-CM

## 2020-07-31 HISTORY — PX: IR RADIOLOGIST EVAL & MGMT: IMG5224

## 2020-07-31 NOTE — Progress Notes (Signed)
Chief Complaint: Patient was consulted remotely today (TeleHealth) for follow up of uterine fibroids.  History of Present Illness: Amanda Paul is a 45 y.o. female who was seen previously on 07/10/2020 after referral by Dr. Ihor Dow for consideration of uterine fibroid embolization.  At that time, it was recommended that an MRI of the pelvis to be performed for further characterization of uterine fibroids.  The MRI was performed on 07/23/2020.  The purpose of this visit was to review MRI findings and discuss treatment recommendations.  Past Medical History:  Diagnosis Date  . Allergy   . Arthritis   . Hernia, umbilical   . Morbid obesity ([place])     Past Surgical History:  Procedure Laterality Date  . COLONOSCOPY WITH PROPOFOL N/A 06/13/2020   Procedure: COLONOSCOPY WITH PROPOFOL;  Surgeon: Carol Ada, MD;  Location: [facility];  Service: Endoscopy;  Laterality: N/A;  . HEMOSTASIS CLIP PLACEMENT  06/13/2020   Procedure: HEMOSTASIS CLIP PLACEMENT;  Surgeon: Carol Ada, MD;  Location: [facility];  Service: Endoscopy;;  . HERNIA REPAIR    . HYSTEROSCOPY WITH D & C N/A 08/26/2014   Procedure: DILATATION AND CURETTAGE /HYSTEROSCOPY and cervical repair of cervical laceration ;  Surgeon: Delsa Bern, MD;  Location: [facility];  Service: Gynecology;  Laterality: N/A;  . IR RADIOLOGIST EVAL & MGMT  07/10/2020  . POLYPECTOMY  06/13/2020   Procedure: POLYPECTOMY;  Surgeon: Carol Ada, MD;  Location: [facility];  Service: Endoscopy;;  . UMBILICAL HERNIA REPAIR    . WISDOM TOOTH EXTRACTION      Allergies: Amoxicillin and Penicillins  Medications: Prior to Admission medications   Medication Sig Start Date End Date Taking? Authorizing Provider  acetaminophen (TYLENOL) 325 MG tablet Take 650 mg by mouth every 6 (six) hours as needed for moderate pain.    [provider]  amLODipine (NORVASC) 5 MG tablet Take 1 tablet (5 mg total) by mouth daily. 06/18/20  09/16/20  Bary Castilla, NP  Calcium Carb-Cholecalciferol (CALCIUM 500 +D PO) Take 1 tablet by mouth daily.    [provider]  Cholecalciferol (VITAMIN D) 2000 UNITS CAPS Take 2,000 Units by mouth daily.    [provider]  CINNAMON PO Take 1 capsule by mouth daily.    [provider]  magnesium oxide (MAG-OX) 400 MG tablet Take 400 mg by mouth daily.    [provider]  meloxicam (MOBIC) 15 MG tablet TAKE 1 TABLET(15 MG) BY MOUTH DAILY 07/16/20   Hyatt, Max T, DPM  meloxicam (MOBIC) 15 MG tablet TAKE 1 TABLET(15 MG) BY MOUTH DAILY 07/16/20   Hyatt, Max T, DPM  Multiple Vitamin (MULTIVITAMIN) capsule Take 1 capsule by mouth daily.    [provider]  Vitamin D, Ergocalciferol, (DRISDOL) 1.25 MG (50000 UNIT) CAPS capsule Take 1 capsule (50,000 Units total) by mouth every 7 (seven) days. 07/22/20   Bary Castilla, NP     Family History  Problem Relation Age of Onset  . Hypertension Mother     Social History   Socioeconomic History  . Marital status: Single    Spouse name: Not on file  . Number of children: Not on file  . Years of education: Not on file  . Highest education level: Not on file  Occupational History  . Not on file  Tobacco Use  . Smoking status: Never Smoker  . Smokeless tobacco: Never Used  Vaping Use  . Vaping Use: Never used  Substance and Sexual Activity  . Alcohol  use: Yes    Alcohol/week: 1.0 standard drink    Types: 1 Cans of beer per week    Comment: rare  . Drug use: No  . Sexual activity: Not Currently  Other Topics Concern  . 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  All other systems are negative.  Physical Exam No direct physical exam was performed (except for noted visual exam findings with Video Visits).   Vital Signs: There were no vitals taken for this visit.  Imaging: MR PELVIS W WO CONTRAST  Result Date: 07/24/2020 CLINICAL DATA:  Pre uterine artery embolization. Large uterine leiomyoma. EXAM: MRI PELVIS WITHOUT AND WITH CONTRAST TECHNIQUE: Multiplanar multisequence MR imaging of the pelvis was performed both before and after administration of intravenous contrast. CONTRAST:  80mL GADAVIST GADOBUTROL 1 MMOL/ML IV SOLN COMPARISON:  CT 06/23/2020 FINDINGS: Urinary Tract: The partially imaged kidneys appear normal. Ureters and bladder normal. Bowel:  Limited view of the small bowel: Unremarkable. Vascular/Lymphatic: No vascular anomaly. Iliac arteries and veins appear normal. Reproductive: Uterus: Measures 19.0 x 10.9 x 19.3 cm (volume = 2090 cm^3). Estimated volume = 2090 cc. The fundus and body of the uterus is markedly expanded by multiple intramural leiomyoma. The endometrium is poorly defined. No enlarged junctional zone to suggest adenomyosis. The leiomyoma have heterogeneous low signal intensity on T2 weighted imaging typical of benign leiomyoma. Largest intramural leiomyoma is within the mid LEFT uterine body measuring 12.2 by 10.9 cm (image 20/series 4. More superior leiomyoma in the RIGHT aspect the uterine fundus measures 6.5 x 6.3 cm. Third leiomyoma in the posterior RIGHT uterine body measures 4.5 by 3.4 cm. Intracavitary fibroids:  No submucosal leiomyoma. Pedunculated fibroids: No exophytic or pedunculated leiomyoma. Fibroid contrast enhancement: Leiomyoma demonstrate uniform arterial enhancement (series 19 and series 23) Right ovary: Difficult to define. Favor RIGHT ovarian tissue measuring 2.9 by 1.6 cm on axial image  12, series 4/T2 weighted. Left ovary:  Not confidently identified. Other:  No free fluid. Musculoskeletal:  Unremarkable IMPRESSION: 1. Multiple large round uniform enhancing intramural leiomyoma. 2. Marked expansion of the uterine body and fundus as above. 3. Ovaries not well identified. Electronically Signed   By: Suzy Bouchard M.D.   On: 07/24/2020 16:34   IR Radiologist Eval & Mgmt  Result Date: 07/10/2020 Please refer to notes tab for details about interventional procedure. (Op Note)   Labs:  CBC: Recent Labs    06/16/20 1109 07/17/20 1127  WBC 4.2 5.0  HGB 13.3 13.5  HCT 42.3 41.3  PLT 257 275    COAGS: No results for input(s): INR, APTT in the last 8760 hours.  BMP: Recent Labs    06/16/20 1109 07/17/20 1127  NA 137 141  K 4.5 4.4  CL 101 100  CO2 21 24  GLUCOSE 77 81  BUN 14 11  CALCIUM 9.5 9.5  CREATININE 0.59 0.60  LIVER FUNCTION TESTS: Recent Labs    07/17/20 1127  BILITOT 0.3  AST 18  ALT 16  ALKPHOS 56  PROT 7.6  ALBUMIN 4.5    Assessment and Plan:  MRI results were reviewed with Ms. Grandville Silos.  Estimated uterine volume on the MRI report was 2100 mL.  Based on measurements that I made myself off of the imaging, my estimate for uterine volume is 2700 mL.  This is closer to the estimated volume of 2600 mL by CT measurements.    A dominant left-sided fundal fibroid measures 12.5 cm in greatest diameter and second largest right-sided fundal fibroid measures 9.5 cm.  There are approximately 10 total leiomyomata.  The endometrial cavity is distorted and displaced towards the right.  All fibroids demonstrate enhancement with suggestion of a small area of central degeneration in the largest fibroid.  No ovarian pathology was identified.  The left ovary was difficult to visualize.  Given the massive size of the uterus and high-volume of enhancing fibroid tissue, I told Ms. Melling that the results from particle embolization of the uterine arteries may  be suboptimal compared to a uterus with a lower volume of fibroid tissue.  Although some degree of shrinkage would certainly be achieved, it would be difficult to completely necrosis the fibroids and ultimately, some of the fibroids may continue to grow after an embolization procedure and the overall uterine volume reduction may not be optimal over time.  She states that her mother went through menopause at a late age and therefore she may have a decade or more of time before reaching menopause.  I told Ms. Bianchini that a hysterectomy would be a more definitive procedure for her if she only wanted to undergo one procedure.  Fibroid embolization may initially result in some volume reduction of the uterus but there would still be a risk of having to undergo hysterectomy before menopause should fibroid tissue continue to grow.  I recommended that she have another conversation with Dr. Ihor Dow about the feasibility of hysterectomy.  Electronically Signed: Azzie Roup 07/31/2020, 3:48 PM     I spent a total of 15 Minutes in remote  clinical consultation, greater than 50% of which was counseling/coordinating care for uterine fibroids.    Visit type: Audio only (telephone). Audio (no video) only due to patient's lack of internet/smartphone capability. Alternative for in-person consultation at [facility], [address], [place], [place]. This visit type was conducted due to national recommendations for restrictions regarding the COVID-19 Pandemic (e.g. social distancing).  This format is felt to be most appropriate for this patient at this time.  All issues noted in this document were discussed and addressed.

## 2020-08-15 ENCOUNTER — Other Ambulatory Visit: Payer: Self-pay

## 2020-08-15 DIAGNOSIS — R030 Elevated blood-pressure reading, without diagnosis of hypertension: Secondary | ICD-10-CM

## 2020-08-15 MED ORDER — AMLODIPINE BESYLATE 5 MG PO TABS: 5.0000 mg | ORAL_TABLET | Freq: Every day | ORAL | 1 refills | Status: DC

## 2020-08-27 ENCOUNTER — Ambulatory Visit (HOSPITAL_COMMUNITY): Payer: No Typology Code available for payment source | Attending: Internal Medicine

## 2020-08-27 ENCOUNTER — Other Ambulatory Visit: Payer: Self-pay

## 2020-08-27 DIAGNOSIS — R9431 Abnormal electrocardiogram [ECG] [EKG]: Secondary | ICD-10-CM | POA: Insufficient documentation

## 2020-08-27 LAB — ECHOCARDIOGRAM COMPLETE
Area-P 1/2: 3.43 cm2
S' Lateral: 3.8 cm

## 2020-08-28 ENCOUNTER — Encounter: Payer: Self-pay | Admitting: Obstetrics & Gynecology

## 2020-08-28 ENCOUNTER — Ambulatory Visit (INDEPENDENT_AMBULATORY_CARE_PROVIDER_SITE_OTHER): Payer: No Typology Code available for payment source | Admitting: Obstetrics & Gynecology

## 2020-08-28 VITALS — BP 154/75 | HR 84 | Ht 65.0 in | Wt 314.0 lb

## 2020-08-28 DIAGNOSIS — D251 Intramural leiomyoma of uterus: Secondary | ICD-10-CM | POA: Diagnosis not present

## 2020-08-28 NOTE — Progress Notes (Signed)
Pt is here for follow up to discuss hysterectomy. LMP: 08/18/20. BCM: None

## 2020-08-28 NOTE — Progress Notes (Signed)
History:  45 y.o. G0P0000 here today for f/u of uterine fibroids. Pt reports no abnormal uterine bleeding or pelvic pain. She was seen by Int radiology and was told that she would likely have to have multiple procedures to manage her fibroids due to the size.   The following portions of the patient's history were reviewed and updated as appropriate: allergies, current medications, past family history, past medical history, past social history, past surgical history and problem list.  Review of Systems:  Pertinent items are noted in HPI.    Objective:  Physical Exam Blood pressure (!) 154/75, pulse 84, height 5\' 5"  (1.651 m), weight (!) 314 lb (142.4 kg), last menstrual period 08/18/2020.  CONSTITUTIONAL: Well-developed, well-nourished female in no acute distress.  HENT:  Normocephalic, atraumatic EYES: Conjunctivae and EOM are normal. No scleral icterus.  NECK: Normal range of motion SKIN: Skin is warm and dry. No rash noted. Not diaphoretic.No pallor. [place]: Alert and oriented to person, place, and time. Normal coordination.  Pelvic:  Not repeated  Labs and Imaging 06/23/2020 CLINICAL DATA:  Preoperative planning for bulky uterine fibroids.  EXAM: CT ABDOMEN AND PELVIS WITH CONTRAST  TECHNIQUE: Multidetector CT imaging of the abdomen and pelvis was performed using the standard protocol following bolus administration of intravenous contrast.  CONTRAST:  124mL OMNIPAQUE IOHEXOL 300 MG/ML  SOLN  COMPARISON:  04/12/2012  FINDINGS: Lower chest: 8 mm right middle lobe subpleural nodule identified on image 3 of series 4.  Hepatobiliary: Asymmetric enlargement of the lateral segment left liver with heterogeneous parenchyma but no overt nodularity of hepatic contour. No focal abnormality in the liver on this study without intravenous contrast. There is no evidence for gallstones, gallbladder wall thickening, or pericholecystic fluid. No intrahepatic or extrahepatic  biliary dilation.  Pancreas: No focal mass lesion. No dilatation of the main duct. No intraparenchymal cyst. No peripancreatic edema.  Spleen: No splenomegaly. No focal mass lesion.  Adrenals/Urinary Tract: No adrenal nodule or mass. Kidneys unremarkable. No evidence for hydroureter. The urinary bladder appears normal for the degree of distention.  Stomach/Bowel: Stomach is nondistended. Duodenum is normally positioned as is the ligament of Treitz. No small bowel wall thickening. No small bowel dilatation. The terminal ileum is normal. The appendix is not well visualized, but there is no edema or inflammation in the region of the cecum. No gross colonic mass. No colonic wall thickening. Diverticular changes are noted in the left colon without evidence of diverticulitis.  Vascular/Lymphatic: No abdominal aortic aneurysm. There is no gastrohepatic or hepatoduodenal ligament lymphadenopathy. No retroperitoneal or mesenteric lymphadenopathy. No pelvic sidewall lymphadenopathy.  Reproductive: Uterus is markedly enlarged and lobular, consistent with bulky fibroid disease. Including fibroids, uterus measures 20 x 14 x 20 cm (2930 cc). There is no adnexal mass.  Other: No intraperitoneal free fluid.  Musculoskeletal: No worrisome lytic or sclerotic osseous abnormality.  IMPRESSION: 1. Markedly enlarged, lobular uterus consistent with bulky fibroid disease. 2. 8 mm right middle lobe subpleural nodule. Non-contrast chest CT at 6-12 months is recommended. If the nodule is stable at time of repeat CT, then future CT at 18-24 months (from today's scan) is considered optional for low-risk patients, but is recommended for high-risk patients. This recommendation follows the consensus statement: Guidelines for Management of Incidental Pulmonary Nodules Detected on CT Images: From the Fleischner Society 2017; Radiology 2017; 284:228-243. 3. Asymmetric enlargement of the lateral  segment left liver with heterogeneous parenchyma but no overt nodularity of hepatic contour. Imaging features are nonspecific but may be  related to early cirrhosis. Attention on follow-up recommended. 4. Left colonic diverticulosis without diverticulitis.  Assessment & Plan:  Large uterine fibroids. Asymptomatic. Pt is high risk for post op complications. I have discussed with the the risks of surgery given that she has no sx. I have discussed with her a tandem approach to care with UFE by Int  Rad followed by [facility] after shrinkage of the fiborids. Will sned a note to Dr. Laural Roes to see if this is an option.      Pts follow up to be determined based on what int rad says.  Total face-to-face time with patient, review of chart, discussion with consultant and coordination of care was 27min.    Amanda Paul, M.D., Cherlynn June

## 2020-08-29 ENCOUNTER — Other Ambulatory Visit: Payer: Self-pay | Admitting: Interventional Radiology

## 2020-08-29 DIAGNOSIS — D250 Submucous leiomyoma of uterus: Secondary | ICD-10-CM

## 2020-09-01 ENCOUNTER — Telehealth: Payer: Self-pay

## 2020-09-01 NOTE — Telephone Encounter (Signed)
Called patient, offered tomorrow appointment- patient will be here tomorrow at 4:20 to discuss ECHO results.  Patient verbalized understanding.

## 2020-09-02 ENCOUNTER — Ambulatory Visit: Payer: No Typology Code available for payment source | Admitting: Cardiovascular Disease

## 2020-09-05 ENCOUNTER — Encounter: Payer: Self-pay | Admitting: Obstetrics & Gynecology

## 2020-09-09 ENCOUNTER — Encounter: Payer: Self-pay | Admitting: *Deleted

## 2020-09-09 ENCOUNTER — Other Ambulatory Visit: Payer: Self-pay

## 2020-09-09 ENCOUNTER — Ambulatory Visit
Admission: RE | Admit: 2020-09-09 | Discharge: 2020-09-09 | Disposition: A | Discharge status: Home / Self Care | Payer: No Typology Code available for payment source | Source: Ambulatory Visit | Attending: Interventional Radiology | Admitting: Interventional Radiology

## 2020-09-09 DIAGNOSIS — D250 Submucous leiomyoma of uterus: Secondary | ICD-10-CM

## 2020-09-09 HISTORY — PX: IR RADIOLOGIST EVAL & MGMT: IMG5224

## 2020-09-09 NOTE — Progress Notes (Signed)
Chief Complaint: Patient was consulted remotely today (TeleHealth) for follow up discussion of uterine fibroid embolization.  History of Present Illness: Amanda Paul is a 45 y.o. female previously seen in consultation for uterine fibroids for possible fibroid embolization. Given significant uterine enlargement by MRI in April, she discussed possible hysterectomy with Dr. Ihor Dow recently. Dr. Ihor Dow has contacted me and considers Amanda Paul a high surgical risk due to her obesity and has requested I speak to her again about possible embolization as a bridge to hysterectomy, if hysterectomy is needed in the future.   Past Medical History:  Diagnosis Date   Allergy    Arthritis    Hernia, umbilical    Morbid obesity ([place])     Past Surgical History:  Procedure Laterality Date   COLONOSCOPY WITH PROPOFOL N/A 06/13/2020   Procedure: COLONOSCOPY WITH PROPOFOL;  Surgeon: Carol Ada, MD;  Location: [facility];  Service: Endoscopy;  Laterality: N/A;   HEMOSTASIS CLIP PLACEMENT  06/13/2020   Procedure: HEMOSTASIS CLIP PLACEMENT;  Surgeon: Carol Ada, MD;  Location: [facility];  Service: Endoscopy;;   HERNIA REPAIR     HYSTEROSCOPY WITH D & C N/A 08/26/2014   Procedure: DILATATION AND CURETTAGE /HYSTEROSCOPY and cervical repair of cervical laceration ;  Surgeon: Delsa Bern, MD;  Location: [facility];  Service: Gynecology;  Laterality: N/A;   IR RADIOLOGIST EVAL & MGMT  07/10/2020   IR RADIOLOGIST EVAL & MGMT  07/31/2020   IR RADIOLOGIST EVAL & MGMT  09/09/2020   POLYPECTOMY  06/13/2020   Procedure: POLYPECTOMY;  Surgeon: Carol Ada, MD;  Location: [facility];  Service: Endoscopy;;   UMBILICAL HERNIA REPAIR     WISDOM TOOTH EXTRACTION      Allergies: Amoxicillin and Penicillins  Medications: Prior to Admission medications   Medication Sig Start Date End Date Taking? Authorizing Provider  acetaminophen (TYLENOL) 325 MG tablet Take 650 mg by mouth  every 6 (six) hours as needed for moderate pain.    [provider]  amLODipine (NORVASC) 5 MG tablet Take 1 tablet (5 mg total) by mouth daily. 08/15/20 11/13/20  Bary Castilla, NP  Calcium Carb-Cholecalciferol (CALCIUM 500 +D PO) Take 1 tablet by mouth daily.    [provider]  Cholecalciferol (VITAMIN D) 2000 UNITS CAPS Take 2,000 Units by mouth daily.    [provider]  CINNAMON PO Take 1 capsule by mouth daily.    [provider]  magnesium oxide (MAG-OX) 400 MG tablet Take 400 mg by mouth daily.    [provider]  meloxicam (MOBIC) 15 MG tablet TAKE 1 TABLET(15 MG) BY MOUTH DAILY 07/16/20   Hyatt, Max T, DPM  meloxicam (MOBIC) 15 MG tablet TAKE 1 TABLET(15 MG) BY MOUTH DAILY 07/16/20   Hyatt, Max T, DPM  Multiple Vitamin (MULTIVITAMIN) capsule Take 1 capsule by mouth daily.    [provider]  Vitamin D, Ergocalciferol, (DRISDOL) 1.25 MG (50000 UNIT) CAPS capsule Take 1 capsule (50,000 Units total) by mouth every 7 (seven) days. 07/22/20   Bary Castilla, NP     Family History  Problem Relation Age of Onset   Hypertension Mother     Social History   Socioeconomic History   Marital status: Single    Spouse name: Not on file   Number of children: Not on file   Years of education: Not on file   Highest education level: Not on file  Occupational History   Not on file  Tobacco Use  Smoking status: Never Smoker   Smokeless tobacco: Never Used  Vaping Use   Vaping Use: Never used  Substance and Sexual Activity   Alcohol use: Yes    Alcohol/week: 1.0 standard drink    Types: 1 Cans of beer per week    Comment: rare   Drug use: No   Sexual activity: Not Currently  Other Topics Concern   Not on file  Social History Narrative   Marital status: single      Children: none      Lives: alone      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   Vital Signs: LMP 08/18/2020 (Exact Date)   Imaging: ECHOCARDIOGRAM COMPLETE  Result Date: 08/27/2020    ECHOCARDIOGRAM REPORT   Patient Name:   Amanda Paul Date of Exam: 08/27/2020 Medical Rec #:  [id number]                 Height:       65.0 in Accession #:    [id number]                Weight:       316.6 lb Date of Birth:  09/19/1975                 BSA:          2.405 m Patient Age:    29 years                  BP:           142/96 mmHg Patient Gender: F                         HR:           74 bpm. Exam Location:  [place] Procedure: 2D Echo, Cardiac Doppler, Color Doppler and Strain Analysis Indications:    R94.31 Abnormal ECG  History:        Patient has no prior history of Echocardiogram examinations.                 Risk Factors:Obesity and Hypertension. No cardiac history.  Sonographer:    Marygrace Drought RCS Referring Phys: Bernice  1. Left ventricular ejection fraction, by estimation, is 55%. The left ventricle has normal function. The left ventricle demonstrates regional wall motion abnormalities (see scoring diagram/findings for description). Left ventricular diastolic parameters are consistent with Grade I diastolic dysfunction (impaired relaxation).  2. Right ventricular systolic function is normal. The right ventricular size is normal. Tricuspid regurgitation signal is inadequate for assessing PA pressure.  3. The mitral valve is normal in structure. Trivial mitral valve regurgitation. No evidence of mitral stenosis.  4. The aortic valve is tricuspid. Aortic valve regurgitation is not visualized. No aortic stenosis is present. Normal coronary artery origins.  5. The inferior vena cava is  normal in size with <50% respiratory variability, suggesting right atrial pressure of 8 mmHg. FINDINGS  Left Ventricle: Left ventricular ejection fraction, by estimation, is 55%. The left ventricle has  normal function. The left ventricle demonstrates regional wall motion abnormalities. Global longitudinal strain performed but not reported based on interpreter judgement due to suboptimal tracking. The left ventricular internal cavity size was normal in size. There is no left ventricular hypertrophy. Left ventricular diastolic parameters are consistent with Grade I diastolic dysfunction (impaired relaxation).  LV Wall Scoring: The mid inferoseptal segment and basal inferoseptal segment are hypokinetic. Right Ventricle: The right ventricular size is normal. No increase in right ventricular wall thickness. Right ventricular systolic function is normal. Tricuspid regurgitation signal is inadequate for assessing PA pressure. Left Atrium: Left atrial size was normal in size. Right Atrium: Right atrial size was normal in size. Pericardium: There is no evidence of pericardial effusion. Mitral Valve: The mitral valve is normal in structure. Trivial mitral valve regurgitation. No evidence of mitral valve stenosis. Tricuspid Valve: The tricuspid valve is normal in structure. Tricuspid valve regurgitation is not demonstrated. No evidence of tricuspid stenosis. Aortic Valve: The aortic valve is tricuspid. Aortic valve regurgitation is not visualized. No aortic stenosis is present. Normal coronary artery origins. Pulmonic Valve: The pulmonic valve was normal in structure. Pulmonic valve regurgitation is trivial. No evidence of pulmonic stenosis. Aorta: The aortic root is normal in size and structure. Venous: The inferior vena cava is normal in size with less than 50% respiratory variability, suggesting right atrial pressure of 8 mmHg. IAS/Shunts: The interatrial septum was not well visualized.  LEFT VENTRICLE PLAX 2D LVIDd:          5.60 cm  Diastology LVIDs:         3.80 cm  LV e' medial:    5.87 cm/s LV PW:         1.30 cm  LV E/e' medial:  9.8 LV IVS:        1.10 cm  LV e' lateral:   6.85 cm/s LVOT diam:     2.20 cm  LV E/e' lateral: 8.4 LV SV:         89 LV SV Index:   37 LVOT Area:     3.80 cm  RIGHT VENTRICLE RV Basal diam:  4.20 cm RV S prime:     11.90 cm/s LEFT ATRIUM             Index       RIGHT ATRIUM           Index LA diam:        4.10 cm 1.71 cm/m  RA Area:     13.70 cm LA Vol (A2C):   70.6 ml 29.36 ml/m RA Volume:   37.20 ml  15.47 ml/m LA Vol (A4C):   74.6 ml 31.02 ml/m LA Biplane Vol: 72.6 ml 30.19 ml/m  AORTIC VALVE LVOT Vmax:   97.90 cm/s LVOT Vmean:  80.900 cm/s LVOT VTI:    0.234 m  AORTA Ao Root diam: 2.90 cm Ao Asc diam:  3.30 cm MITRAL VALVE MV Area (PHT): 3.43 cm    SHUNTS MV Decel Time: 221 msec    Systemic VTI:  0.23 m MV E velocity: 57.40 cm/s  Systemic Diam: 2.20 cm MV A velocity: 85.30 cm/s MV E/A ratio:  0.67 Cherlynn Kaiser MD Electronically signed by Cherlynn Kaiser MD Signature Date/Time: 08/27/2020/8:46:38 AM    Final    IR Radiologist Eval & Mgmt  Result Date: 09/09/2020 Please refer to notes tab for details about interventional procedure. (Op Note)   Labs:  CBC: Recent Labs    06/16/20 1109 07/17/20 1127  WBC 4.2 5.0  HGB 13.3 13.5  HCT 42.3 41.3  PLT 257 275    COAGS: No results for input(s): INR, APTT in the last 8760 hours.  BMP: Recent Labs    06/16/20 1109 07/17/20 1127  NA 137 141  K 4.5 4.4  CL 101 100  CO2 21 24  GLUCOSE 77 81  BUN 14 11  CALCIUM 9.5 9.5  CREATININE 0.59 0.60    LIVER FUNCTION TESTS: Recent Labs    07/17/20 1127  BILITOT 0.3  AST 18  ALT 16  ALKPHOS 56  PROT 7.6  ALBUMIN 4.5     Assessment and Plan:  I spoke with Amanda Paul by phone. Although I had told her in the past that embolization outcome may not be as optimal given size of her uterus of approximately 2600 mL, given her higher surgical risk, we could certainly  consider embolization in the future to try to shrink fibroid tissue as much as possible. This could be enough alone to alleviate significant bulk symptoms and menorrhagia before menopause. Should she go on to future hysterectomy, shrinking some of the fibroids and reducing overall uterine volume should reduce surgical morbidity. Amanda Paul is interested in pursuing embolization and would like to schedule the procedure at some point in the near future. I will have a scheduler contact her and begin the authorization and scheduling process.   Electronically Signed: Azzie Roup 09/09/2020, 5:25 PM     I spent a total of 10 Minutes in remote  clinical consultation, greater than 50% of which was counseling/coordinating care for uterine fibroids.    Visit type: Audio only (telephone). Audio (no video) only due to patient's lack of internet/smartphone capability. Alternative for in-person consultation at [facility], [address], [place], [place]. This visit type was conducted due to national recommendations for restrictions regarding the COVID-19 Pandemic (e.g. social distancing).  This format is felt to be most appropriate for this patient at this time.  All issues noted in this document were discussed and addressed.

## 2020-09-10 ENCOUNTER — Other Ambulatory Visit: Payer: Self-pay

## 2020-09-10 DIAGNOSIS — R030 Elevated blood-pressure reading, without diagnosis of hypertension: Secondary | ICD-10-CM

## 2020-09-10 MED ORDER — AMLODIPINE BESYLATE 5 MG PO TABS
5.0000 mg | ORAL_TABLET | Freq: Every day | ORAL | 1 refills | Status: DC
Start: 2020-09-10 — End: 2020-09-15

## 2020-09-15 ENCOUNTER — Encounter: Payer: Self-pay | Admitting: Cardiovascular Disease

## 2020-09-15 ENCOUNTER — Ambulatory Visit (INDEPENDENT_AMBULATORY_CARE_PROVIDER_SITE_OTHER): Payer: No Typology Code available for payment source | Admitting: Cardiovascular Disease

## 2020-09-15 ENCOUNTER — Other Ambulatory Visit: Payer: Self-pay

## 2020-09-15 VITALS — BP 140/88 | HR 66 | Ht 65.0 in | Wt 318.0 lb

## 2020-09-15 DIAGNOSIS — I10 Essential (primary) hypertension: Secondary | ICD-10-CM

## 2020-09-15 DIAGNOSIS — R9431 Abnormal electrocardiogram [ECG] [EKG]: Secondary | ICD-10-CM

## 2020-09-15 DIAGNOSIS — R030 Elevated blood-pressure reading, without diagnosis of hypertension: Secondary | ICD-10-CM

## 2020-09-15 DIAGNOSIS — E7800 Pure hypercholesterolemia, unspecified: Secondary | ICD-10-CM | POA: Diagnosis not present

## 2020-09-15 MED ORDER — ROSUVASTATIN CALCIUM 10 MG PO TABS: 10.0000 mg | ORAL_TABLET | Freq: Every day | ORAL | 3 refills | Status: DC

## 2020-09-15 MED ORDER — AMLODIPINE BESYLATE 10 MG PO TABS: 10.0000 mg | ORAL_TABLET | Freq: Every day | ORAL | 3 refills | Status: DC

## 2020-09-15 NOTE — Progress Notes (Signed)
Cardiology Office Note    Date:  09/17/2020   ID:  Amanda Paul, DOB 1975-04-22, MRN [id number]  PCP:  Bary Castilla, NP  Cardiologist:  Shelva Majestic, MD   2 month F/U cardiology evaluation  History of Present Illness:  Amanda Paul is a 45 y.o. female who was referred through the courtesy of Amanda Ghumman,NP for evaluation of a possible abnormal ECG.  I saw her for initial evaluation on July 28, 2020.  She presents for follow-up evaluation.  Amanda Paul denies any known history of cardiac disease.  The patient has a history of super morbid obesity.  Her peak weight occurred approximately 8 years ago when she weighed 380 pounds.  She subsequently lost weight down to 290 297 pounds but recently has regained some of the weight back with her current weight in the range of 316 pounds.  She believes when she graduated from high school her weight was 215 pounds.  She was recently evaluated for an annual physical exam.  Apparently, the EKG that was done was reported as showing an anterior infarct due to poor R progression and QS complex in V1 V2.  She denies any chest pain.  In March 2022 she had recently been evaluated and apparently was started on amlodipine 5 mg for hypertension. She states her blood pressure was 160/92.  On therapy the following month her blood pressure had improved to 130/80.  She has been trying to alter her diet and restrict sodium intake.  She has a history of uterine fibroids and may ultimately require uterine artery embolization.  Presently she denies any change in exercise tolerance, shortness of breath, palpitations, presyncope or syncope.  During my evaluation, I suspected that her QS complex in V1 and V2 may have been contributed by her body habitus rather than as a result of a prior anteroseptal MI.  I recommended that she undergo a 2D echo Doppler study.  I reviewed laboratory which showed increased LDL cholesterol at 139 and discussed  potential need for lipid-lowering therapy.  I discussed importance of weight loss and exercising least 5 days/week for up to 30 minutes.  Over the last 2 months, she has remained asymptomatic.  She underwent an echo Doppler study on Aug 27, 2020 which showed an EF at 55%.  There was grade 1 diastolic dysfunction.  The echo suggested the possibility of mild hypokinesis in the very basal inferoseptal segment but otherwise had normal wall motion.  She has been adjusting her diet.  She has not been successful with weight loss.  She presents for reevaluation.  Past Medical History:  Diagnosis Date  . Allergy   . Arthritis   . Hernia, umbilical   . Morbid obesity ([place])     Past Surgical History:  Procedure Laterality Date  . COLONOSCOPY WITH PROPOFOL N/A 06/13/2020   Procedure: COLONOSCOPY WITH PROPOFOL;  Surgeon: Carol Ada, MD;  Location: [facility];  Service: Endoscopy;  Laterality: N/A;  . HEMOSTASIS CLIP PLACEMENT  06/13/2020   Procedure: HEMOSTASIS CLIP PLACEMENT;  Surgeon: Carol Ada, MD;  Location: [facility];  Service: Endoscopy;;  . HERNIA REPAIR    . HYSTEROSCOPY WITH D & C N/A 08/26/2014   Procedure: DILATATION AND CURETTAGE /HYSTEROSCOPY and cervical repair of cervical laceration ;  Surgeon: Delsa Bern, MD;  Location: [facility];  Service: Gynecology;  Laterality: N/A;  . IR RADIOLOGIST EVAL & MGMT  07/10/2020  . IR RADIOLOGIST EVAL & MGMT  07/31/2020  . IR RADIOLOGIST EVAL &  MGMT  09/09/2020  . POLYPECTOMY  06/13/2020   Procedure: POLYPECTOMY;  Surgeon: Carol Ada, MD;  Location: [facility];  Service: Endoscopy;;  . UMBILICAL HERNIA REPAIR    . WISDOM TOOTH EXTRACTION      Current Medications: Outpatient Medications Prior to Visit  Medication Sig Dispense Refill  . acetaminophen (TYLENOL) 325 MG tablet Take 650 mg by mouth every 6 (six) hours as needed for moderate pain.    . Calcium Carb-Cholecalciferol (CALCIUM 500 +D PO) Take 1 tablet by mouth daily.    .  Cholecalciferol (VITAMIN D) 2000 UNITS CAPS Take 2,000 Units by mouth daily.    Marland Kitchen CINNAMON PO Take 1 capsule by mouth daily.    . magnesium oxide (MAG-OX) 400 MG tablet Take 400 mg by mouth daily.    . meloxicam (MOBIC) 15 MG tablet TAKE 1 TABLET(15 MG) BY MOUTH DAILY 30 tablet 0  . meloxicam (MOBIC) 15 MG tablet TAKE 1 TABLET(15 MG) BY MOUTH DAILY 30 tablet 3  . Multiple Vitamin (MULTIVITAMIN) capsule Take 1 capsule by mouth daily.    . Vitamin D, Ergocalciferol, (DRISDOL) 1.25 MG (50000 UNIT) CAPS capsule Take 1 capsule (50,000 Units total) by mouth every 7 (seven) days. 12 capsule 0  . amLODipine (NORVASC) 5 MG tablet Take 1 tablet (5 mg total) by mouth daily. 90 tablet 1   No facility-administered medications prior to visit.     Allergies:   Amoxicillin and Penicillins   Social History   Socioeconomic History  . Marital status: Single    Spouse name: Not on file  . Number of children: Not on file  . Years of education: Not on file  . Highest education level: Not on file  Occupational History  . Not on file  Tobacco Use  . Smoking status: Never Smoker  . Smokeless tobacco: Never Used  Vaping Use  . Vaping Use: Never used  Substance and Sexual Activity  . Alcohol use: Yes    Alcohol/week: 1.0 standard drink    Types: 1 Cans of beer per week    Comment: rare  . Drug use: No  . Sexual activity: Not Currently  Other Topics Concern  . 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  She is being [place] since 1993.  She is single and has no children.  She did attend college for short  time.  She currently works for [employer].  She rarely drinks a glass of wine.  Only she had exercised about 4 times per week but now that she is working she has not been exercising.  Family History: Family history is notable that her mother is 51 years old and has asthma, COPD and hypertension.  Father is 43.  Is a brother who is her father's son but not her mother's.  ROS General: Negative; No fevers, chills, or night sweats; super morbid obesity HEENT: Negative; No changes in vision or hearing, sinus congestion, difficulty swallowing Pulmonary: Negative; No cough, wheezing, shortness  of breath, hemoptysis Cardiovascular: Negative; No chest pain, presyncope, syncope, palpitations GI: Negative; No nausea, vomiting, diarrhea, or abdominal pain GU: Uterine fibroids Musculoskeletal: Negative; no myalgias, joint pain, or weakness Hematologic/Oncology: Negative; no easy bruising, bleeding Endocrine: Negative; no heat/cold intolerance; no diabetes Neuro: Negative; no changes in balance, headaches Skin: Negative; No rashes or skin lesions Psychiatric: Negative; No behavioral problems, depression Sleep: Negative; No snoring, daytime sleepiness, hypersomnolence, bruxism, restless legs, hypnogognic hallucinations, no cataplexy Other comprehensive 14 point system review is negative.   PHYSICAL EXAM:   VS:  BP 140/88 (BP Location: Left Arm, Patient Position: Sitting, Cuff Size: Large)   Pulse 66   Ht _0  (1.651 m)   Wt (!) 318 lb (144.2 kg)   LMP 08/18/2020 (Exact Date)   SpO2 99%   BMI 52.92 kg/m     Repeat blood pressure by me was 140/90  Wt Readings from Last 3 Encounters:  09/15/20 (!) 318 lb (144.2 kg)  08/28/20 (!) 314 lb (142.4 kg)  07/28/20 (!) 316 lb 9.6 oz (143.6 kg)    General: Alert, oriented, no distress.  Skin: normal turgor, no rashes, warm and dry HEENT: Normocephalic, atraumatic. Pupils equal round and reactive to light; sclera anicteric; extraocular muscles intact;   Nose without nasal septal hypertrophy Mouth/Parynx benign; Mallinpatti  3 Neck: No JVD, no carotid bruits; normal carotid upstroke Lungs: clear to ausculatation and percussion; no wheezing or rales Chest wall: without tenderness to palpitation Heart: PMI not displaced, RRR, s1 s2 normal, 1/6 systolic murmur, no diastolic murmur, no rubs, gallops, thrills, or heaves Abdomen: Significant central adiposity; soft, nontender; no hepatosplenomehaly, BS+; abdominal aorta nontender and not dilated by palpation. Back: no CVA tenderness Pulses 2+ Musculoskeletal: full range of motion, normal strength, no joint deformities Extremities: no clubbing cyanosis or edema, Homan's sign negative  Neurologic: grossly nonfocal; Cranial nerves grossly wnl Psychologic: Normal mood and affect   Studies/Labs Reviewed:   EKG:  EKG is ordered today. ECG (independently read by me): NSR at 72; QS V1-2, , nonpecific T wave abnormality III, aVF. No ectopy, normal intervals  Recent Labs: BMP Latest Ref Rng & Units 07/17/2020 06/16/2020 04/11/2012  Glucose 65 - 99 mg/dL 81 77 94  BUN 6 - 24 mg/dL _1 Creatinine 0.57 - 1.00 mg/dL 0.60 0.59 0.51  BUN/Creat Ratio 9 - 23 18 24(H) -  Sodium 134 - 144 mmol/L 141 137 137  Potassium 3.5 - 5.2 mmol/L 4.4 4.5 3.2(L)  Chloride 96 - 106 mmol/L 100 101 97  CO2 20 - 29 mmol/L _2 Calcium 8.7 - 10.2 mg/dL 9.5 9.5 9.8     Hepatic Function Latest Ref Rng & Units 07/17/2020 04/11/2012  Total Protein 6.0 - 8.5 g/dL 7.6 8.2  Albumin 3.8 - 4.8 g/dL 4.5 3.8  AST 0 - 40 IU/L 18 24  ALT 0 - 32 IU/L 16 22  Alk Phosphatase 44 - 121 IU/L 56 52  Total Bilirubin 0.0 - 1.2 mg/dL 0.3 0.3    CBC Latest Ref Rng & Units 07/17/2020 06/16/2020 09/30/2015  WBC 3.4 - 10.8 x10E3/uL 5.0 4.2 5.9  Hemoglobin 11.1 - 15.9 g/dL 13.5 13.3 12.0(A)  Hematocrit 34.0 - 46.6 % 41.3 42.3 35.2(A)  Platelets 150 - 450 x10E3/uL 275 257 -   Lab Results  Component Value Date   MCV 87 07/17/2020   MCV  90 06/16/2020   MCV 80.9 09/30/2015   Lab Results  Component Value Date   TSH 1.57 09/30/2015  Lab Results  Component Value Date   HGBA1C 5.8 (H) 07/17/2020     BNP No results found for: BNP  ProBNP No results found for: PROBNP   Lipid Panel     Component Value Date/Time   CHOL 229 (H) 07/17/2020 1127   TRIG 52 07/17/2020 1127   HDL 81 07/17/2020 1127   CHOLHDL 2.8 07/17/2020 1127   CHOLHDL 2.7 09/30/2015 0910   VLDL 11 09/30/2015 0910   LDLCALC 139 (H) 07/17/2020 1127   LABVLDL 9 07/17/2020 1127     RADIOLOGY: ECHOCARDIOGRAM COMPLETE  Result Date: 08/27/2020    ECHOCARDIOGRAM REPORT   Patient Name:   Amanda Paul Date of Exam: 08/27/2020 Medical Rec #:  [id number]                 Height:       65.0 in Accession #:    [id number]                Weight:       316.6 lb Date of Birth:  1976/02/03                 BSA:          2.405 m Patient Age:    44 years                  BP:           142/96 mmHg Patient Gender: F                         HR:           74 bpm. Exam Location:  [facility] Procedure: 2D Echo, Cardiac Doppler, Color Doppler and Strain Analysis Indications:    R94.31 Abnormal ECG  History:        Patient has no prior history of Echocardiogram examinations.                 Risk Factors:Obesity and Hypertension. No cardiac history.  Sonographer:    Marygrace Drought RCS Referring Phys: Vayas  1. Left ventricular ejection fraction, by estimation, is 55%. The left ventricle has normal function. The left ventricle demonstrates regional wall motion abnormalities (see scoring diagram/findings for description). Left ventricular diastolic parameters are consistent with Grade I diastolic dysfunction (impaired relaxation).  2. Right ventricular systolic function is normal. The right ventricular size is normal. Tricuspid regurgitation signal is inadequate for assessing PA pressure.  3. The mitral valve is normal in structure. Trivial mitral  valve regurgitation. No evidence of mitral stenosis.  4. The aortic valve is tricuspid. Aortic valve regurgitation is not visualized. No aortic stenosis is present. Normal coronary artery origins.  5. The inferior vena cava is normal in size with <50% respiratory variability, suggesting right atrial pressure of 8 mmHg. FINDINGS  Left Ventricle: Left ventricular ejection fraction, by estimation, is 55%. The left ventricle has normal function. The left ventricle demonstrates regional wall motion abnormalities. Global longitudinal strain performed but not reported based on interpreter judgement due to suboptimal tracking. The left ventricular internal cavity size was normal in size. There is no left ventricular hypertrophy. Left ventricular diastolic parameters are consistent with Grade I diastolic dysfunction (impaired relaxation).  LV Wall Scoring: The mid inferoseptal segment and basal inferoseptal segment are hypokinetic. Right Ventricle: The right ventricular size is normal. No increase in right ventricular wall thickness. Right ventricular systolic function is normal. Tricuspid regurgitation  signal is inadequate for assessing PA pressure. Left Atrium: Left atrial size was normal in size. Right Atrium: Right atrial size was normal in size. Pericardium: There is no evidence of pericardial effusion. Mitral Valve: The mitral valve is normal in structure. Trivial mitral valve regurgitation. No evidence of mitral valve stenosis. Tricuspid Valve: The tricuspid valve is normal in structure. Tricuspid valve regurgitation is not demonstrated. No evidence of tricuspid stenosis. Aortic Valve: The aortic valve is tricuspid. Aortic valve regurgitation is not visualized. No aortic stenosis is present. Normal coronary artery origins. Pulmonic Valve: The pulmonic valve was normal in structure. Pulmonic valve regurgitation is trivial. No evidence of pulmonic stenosis. Aorta: The aortic root is normal in size and structure. Venous:  The inferior vena cava is normal in size with less than 50% respiratory variability, suggesting right atrial pressure of 8 mmHg. IAS/Shunts: The interatrial septum was not well visualized.  LEFT VENTRICLE PLAX 2D LVIDd:         5.60 cm  Diastology LVIDs:         3.80 cm  LV e' medial:    5.87 cm/s LV PW:         1.30 cm  LV E/e' medial:  9.8 LV IVS:        1.10 cm  LV e' lateral:   6.85 cm/s LVOT diam:     2.20 cm  LV E/e' lateral: 8.4 LV SV:         89 LV SV Index:   37 LVOT Area:     3.80 cm  RIGHT VENTRICLE RV Basal diam:  4.20 cm RV S prime:     11.90 cm/s LEFT ATRIUM             Index       RIGHT ATRIUM           Index LA diam:        4.10 cm 1.71 cm/m  RA Area:     13.70 cm LA Vol (A2C):   70.6 ml 29.36 ml/m RA Volume:   37.20 ml  15.47 ml/m LA Vol (A4C):   74.6 ml 31.02 ml/m LA Biplane Vol: 72.6 ml 30.19 ml/m  AORTIC VALVE LVOT Vmax:   97.90 cm/s LVOT Vmean:  80.900 cm/s LVOT VTI:    0.234 m  AORTA Ao Root diam: 2.90 cm Ao Asc diam:  3.30 cm MITRAL VALVE MV Area (PHT): 3.43 cm    SHUNTS MV Decel Time: 221 msec    Systemic VTI:  0.23 m MV E velocity: 57.40 cm/s  Systemic Diam: 2.20 cm MV A velocity: 85.30 cm/s MV E/A ratio:  0.67 Cherlynn Kaiser MD Electronically signed by Cherlynn Kaiser MD Signature Date/Time: 08/27/2020/8:46:38 AM    Final    IR Radiologist Eval & Mgmt  Result Date: 09/09/2020 Please refer to notes tab for details about interventional procedure. (Op Note)    Additional studies/ records that were reviewed today include:  I reviewed the records from Bary Castilla, NP  ASSESSMENT:    1. Primary hypertension   2. Morbid obesity ([place])   3. Abnormal EKG   4. Pure hypercholesterolemia    PLAN:  Ms. Tayna Smethurst is a very pleasant 45 year old female who has a longstanding history of super morbid obesity and previously had a peak weight of 380 pounds.  When I initially saw her in April 2022 her weight was 316 pounds and BMI was 52.7.  There was no history of known  CAD and she denied  any chest pain PND, orthopnea, presyncope, syncope or dysrhythmia.   In March 2022 she was started on amlodipine 5 mg for blood pressure elevation at 160/92.  Subsequently, her blood pressure had improved.  Since her initial evaluation, she underwent a 2D echo Doppler study.  Her previous ECG had shown QS complex in V1 and V2 but I suspect back to this was mostly contributed by her body habitus rather than having had a prior myocardial infarction her echo shows normal systolic function with grade 2 diastolic dysfunction.  There was suggestion of possible mild hypocontractility in the mid to basal inferoseptal region.  She continues to remain asymptomatic with reference to any chest pain.  Her blood pressure today remains elevated and on repeat by me was 140/90.  I am recommending further titration of amlodipine to 10 mg daily.  This should have both blood pressure benefit as well as potential ischemic benefit.  With her body habitus I do not believe she is a candidate for routine treadmill testing or nuclear imaging due to high likelihood of attenuation artifact.  Presently there is a contrast shortage in the future she may benefit from coronary CTA evaluation.  With her lipid studies showing increased LDL cholesterol at 139 I strongly recommended initiation of lipid-lowering therapy and will start treatment with rosuvastatin 10 mg.  She has a follow-up appointment to see her primary provider Bary Castilla on October 15, 2020.  If her blood pressure remains elevated I would recommend addition of angiotensin receptor blocker therapy to her amlodipine regimen.  I again discussed the importance of weight loss.  Her weight today increased by 2 pounds at 318 with a BMI of 52.9.  We discussed proper diet and exercise.  I will see her in 6 months for reevaluation or sooner as needed.   Medication Adjustments/Labs and Tests Ordered: Current medicines are reviewed at length with the patient today.   Concerns regarding medicines are outlined above.  Medication changes, Labs and Tests ordered today are listed in the Patient Instructions below. Patient Instructions  Medication Instructions:  INCREASE amlodipine to 64m once daily  BEGIN rosuvastatin 168monce daily.   *If you need a refill on your cardiac medications before your next appointment, please call your pharmacy*   Lab Work: None ordered.    Testing/Procedures: None ordered.    Follow-Up: [facility].  As part of our continuing mission to provide you with exceptional heart care, we have created designated Provider Care Teams.  These Care Teams include your primary Cardiologist (physician) and Advanced Practice Providers (APPs -  Physician Assistants and Nurse Practitioners) who all work together to provide you with the care you need, when you need it.  We recommend signing up for the patient portal called "MyChart".  Sign up information is provided on this After Visit Summary.  MyChart is used to connect with patients for Virtual Visits (Telemedicine).  Patients are able to view lab/test results, encounter notes, upcoming appointments, etc.  Non-urgent messages can be sent to your provider as well.   To learn more about what you can do with MyChart, go to [email or web address]   Your next appointment:   6 month(s)  The format for your next appointment:   In Person  Provider:   ThShelva MajesticMD   Other Instructions Follow up with your primary care provider.      Signed, ThShelva MajesticMD  09/17/2020 7:42 PM    [facility] [address], [place], [place]  [address] Phone: [phone]

## 2020-09-15 NOTE — Patient Instructions (Signed)
Medication Instructions:  INCREASE amlodipine to 10mg  once daily  BEGIN rosuvastatin 10mg  once daily.   *If you need a refill on your cardiac medications before your next appointment, please call your pharmacy*   Lab Work: None ordered.    Testing/Procedures: None ordered.    Follow-Up: At [facility], you and your health needs are our priority.  As part of our continuing mission to provide you with exceptional heart care, we have created designated Provider Care Teams.  These Care Teams include your primary Cardiologist (physician) and Advanced Practice Providers (APPs -  Physician Assistants and Nurse Practitioners) who all work together to provide you with the care you need, when you need it.  We recommend signing up for the patient portal called "MyChart".  Sign up information is provided on this After Visit Summary.  MyChart is used to connect with patients for Virtual Visits (Telemedicine).  Patients are able to view lab/test results, encounter notes, upcoming appointments, etc.  Non-urgent messages can be sent to your provider as well.   To learn more about what you can do with MyChart, go to [email or web address].    Your next appointment:   6 month(s)  The format for your next appointment:   In Person  Provider:   Shelva Majestic, MD   Other Instructions Follow up with your primary care provider.

## 2020-09-17 ENCOUNTER — Encounter: Payer: Self-pay | Admitting: Cardiovascular Disease

## 2020-09-26 ENCOUNTER — Other Ambulatory Visit (HOSPITAL_COMMUNITY): Payer: Self-pay | Admitting: Interventional Radiology

## 2020-09-26 DIAGNOSIS — D259 Leiomyoma of uterus, unspecified: Secondary | ICD-10-CM

## 2020-10-07 ENCOUNTER — Other Ambulatory Visit: Payer: Self-pay | Admitting: Radiology

## 2020-10-07 ENCOUNTER — Other Ambulatory Visit (HOSPITAL_COMMUNITY)
Admission: RE | Admit: 2020-10-07 | Discharge: 2020-10-07 | Disposition: A | Discharge status: Home / Self Care | Payer: No Typology Code available for payment source | Source: Ambulatory Visit | Attending: Interventional Radiology | Admitting: Interventional Radiology

## 2020-10-07 DIAGNOSIS — Z20822 Contact with and (suspected) exposure to covid-19: Secondary | ICD-10-CM | POA: Diagnosis not present

## 2020-10-07 DIAGNOSIS — Z01812 Encounter for preprocedural laboratory examination: Secondary | ICD-10-CM | POA: Diagnosis not present

## 2020-10-07 LAB — SARS CORONAVIRUS 2 (TAT 6-24 HRS): SARS Coronavirus 2: NEGATIVE

## 2020-10-08 ENCOUNTER — Other Ambulatory Visit: Payer: Self-pay

## 2020-10-08 ENCOUNTER — Encounter (HOSPITAL_COMMUNITY): Payer: Self-pay

## 2020-10-08 ENCOUNTER — Ambulatory Visit (HOSPITAL_COMMUNITY)
Admission: RE | Admit: 2020-10-08 | Discharge: 2020-10-08 | Disposition: A | Discharge status: Home / Self Care | Payer: No Typology Code available for payment source | Source: Ambulatory Visit | Attending: Interventional Radiology | Admitting: Interventional Radiology

## 2020-10-08 ENCOUNTER — Observation Stay (HOSPITAL_COMMUNITY)
Admission: RE | Admit: 2020-10-08 | Discharge: 2020-10-09 | Disposition: A | Discharge status: Home / Self Care | Payer: No Typology Code available for payment source | Source: Ambulatory Visit | Attending: Interventional Radiology | Admitting: Interventional Radiology

## 2020-10-08 DIAGNOSIS — Z6841 Body Mass Index (BMI) 40.0 and over, adult: Secondary | ICD-10-CM | POA: Diagnosis not present

## 2020-10-08 DIAGNOSIS — D259 Leiomyoma of uterus, unspecified: Principal | ICD-10-CM | POA: Diagnosis present

## 2020-10-08 DIAGNOSIS — R102 Pelvic and perineal pain: Secondary | ICD-10-CM | POA: Diagnosis not present

## 2020-10-08 DIAGNOSIS — Z791 Long term (current) use of non-steroidal anti-inflammatories (NSAID): Secondary | ICD-10-CM | POA: Insufficient documentation

## 2020-10-08 DIAGNOSIS — Z880 Allergy status to penicillin: Secondary | ICD-10-CM | POA: Diagnosis not present

## 2020-10-08 DIAGNOSIS — Z79899 Other long term (current) drug therapy: Secondary | ICD-10-CM | POA: Diagnosis not present

## 2020-10-08 HISTORY — PX: IR US GUIDE VASC ACCESS RIGHT: IMG2390

## 2020-10-08 HISTORY — PX: IR ANGIOGRAM SELECTIVE EACH ADDITIONAL VESSEL: IMG667

## 2020-10-08 HISTORY — PX: IR ANGIOGRAM PELVIS SELECTIVE OR SUPRASELECTIVE: IMG661

## 2020-10-08 HISTORY — PX: IR EMBO TUMOR ORGAN ISCHEMIA INFARCT INC GUIDE ROADMAPPING: IMG5449

## 2020-10-08 LAB — CBC WITH DIFFERENTIAL/PLATELET
Abs Immature Granulocytes: 0.02 10*3/uL (ref 0.00–0.07)
Basophils Absolute: 0 10*3/uL (ref 0.0–0.1)
Basophils Relative: 1 %
Eosinophils Absolute: 0.2 10*3/uL (ref 0.0–0.5)
Eosinophils Relative: 3 %
HCT: 40.9 % (ref 36.0–46.0)
Hemoglobin: 12.6 g/dL (ref 12.0–15.0)
Immature Granulocytes: 0 %
Lymphocytes Relative: 31 %
Lymphs Abs: 1.5 10*3/uL (ref 0.7–4.0)
MCH: 27.3 pg (ref 26.0–34.0)
MCHC: 30.8 g/dL (ref 30.0–36.0)
MCV: 88.7 fL (ref 80.0–100.0)
Monocytes Absolute: 0.6 10*3/uL (ref 0.1–1.0)
Monocytes Relative: 12 %
Neutro Abs: 2.6 10*3/uL (ref 1.7–7.7)
Neutrophils Relative %: 53 %
Platelets: 274 10*3/uL (ref 150–400)
RBC: 4.61 MIL/uL (ref 3.87–5.11)
RDW: 14.2 % (ref 11.5–15.5)
WBC: 5 10*3/uL (ref 4.0–10.5)
nRBC: 0 % (ref 0.0–0.2)

## 2020-10-08 LAB — BASIC METABOLIC PANEL
Anion gap: 6 (ref 5–15)
BUN: 17 mg/dL (ref 6–20)
CO2: 26 mmol/L (ref 22–32)
Calcium: 9.3 mg/dL (ref 8.9–10.3)
Chloride: 106 mmol/L (ref 98–111)
Creatinine, Ser: 0.49 mg/dL (ref 0.44–1.00)
GFR, Estimated: >60 mL/min (ref >60)
Glucose, Bld: 96 mg/dL (ref 70–99)
Potassium: 3.7 mmol/L (ref 3.5–5.1)
Sodium: 138 mmol/L (ref 135–145)

## 2020-10-08 LAB — HCG, SERUM, QUALITATIVE: Preg, Serum: NEGATIVE

## 2020-10-08 LAB — PROTIME-INR
INR: 0.9 (ref 0.8–1.2)
Prothrombin Time: 12.2 seconds (ref 11.4–15.2)

## 2020-10-08 MED ORDER — SODIUM CHLORIDE 0.9 % IV SOLN: 250.0000 mL | INTRAVENOUS | Status: DC | PRN

## 2020-10-08 MED ORDER — IOHEXOL 300 MG/ML  SOLN
75.0000 mL | Freq: Once | INTRAMUSCULAR | Status: AC | PRN
  Administered 2020-10-08: 35 mL via INTRA_ARTERIAL

## 2020-10-08 MED ORDER — ONDANSETRON HCL 4 MG/2ML IJ SOLN
4.0000 mg | Freq: Four times a day (QID) | INTRAMUSCULAR | Status: DC | PRN
  Filled 2020-10-08: qty 2

## 2020-10-08 MED ORDER — KETOROLAC TROMETHAMINE 30 MG/ML IJ SOLN
INTRAMUSCULAR | Status: AC | PRN
  Administered 2020-10-08: 30 mg via INTRAVENOUS

## 2020-10-08 MED ORDER — PROMETHAZINE HCL 25 MG RE SUPP
25.0000 mg | Freq: Three times a day (TID) | RECTAL | Status: DC | PRN
  Filled 2020-10-08: qty 1

## 2020-10-08 MED ORDER — VANCOMYCIN HCL IN DEXTROSE 1-5 GM/200ML-% IV SOLN
INTRAVENOUS | Status: AC
  Administered 2020-10-08: 1000 mg via INTRAVENOUS
  Filled 2020-10-08: qty 200

## 2020-10-08 MED ORDER — LIDOCAINE HCL (PF) 1 % IJ SOLN
INTRAMUSCULAR | Status: AC | PRN
Start: 2020-10-08 — End: 2020-10-08
  Administered 2020-10-08: 10 mL via INTRADERMAL

## 2020-10-08 MED ORDER — LIDOCAINE HCL 1 % IJ SOLN
INTRAMUSCULAR | Status: AC
  Filled 2020-10-08: qty 20

## 2020-10-08 MED ORDER — KETOROLAC TROMETHAMINE 30 MG/ML IJ SOLN
INTRAMUSCULAR | Status: AC
  Filled 2020-10-08: qty 1

## 2020-10-08 MED ORDER — DOCUSATE SODIUM 100 MG PO CAPS
100.0000 mg | ORAL_CAPSULE | Freq: Two times a day (BID) | ORAL | Status: DC
  Administered 2020-10-08 – 2020-10-09 (×2): 100 mg via ORAL
  Filled 2020-10-08 (×2): qty 1

## 2020-10-08 MED ORDER — PROMETHAZINE HCL 25 MG PO TABS: 25.0000 mg | ORAL_TABLET | Freq: Three times a day (TID) | ORAL | Status: DC | PRN

## 2020-10-08 MED ORDER — MIDAZOLAM HCL 2 MG/2ML IJ SOLN
INTRAMUSCULAR | Status: AC
  Filled 2020-10-08: qty 6

## 2020-10-08 MED ORDER — IOHEXOL 300 MG/ML  SOLN
25.0000 mL | Freq: Once | INTRAMUSCULAR | Status: AC | PRN
  Administered 2020-10-08: 25 mL via INTRA_ARTERIAL

## 2020-10-08 MED ORDER — FENTANYL CITRATE (PF) 100 MCG/2ML IJ SOLN
INTRAMUSCULAR | Status: AC | PRN
  Administered 2020-10-08 (×6): 50 ug via INTRAVENOUS

## 2020-10-08 MED ORDER — VANCOMYCIN HCL IN DEXTROSE 1-5 GM/200ML-% IV SOLN: 1000.0000 mg | Freq: Once | INTRAVENOUS | Status: AC

## 2020-10-08 MED ORDER — NALOXONE HCL 0.4 MG/ML IJ SOLN: 0.4000 mg | INTRAMUSCULAR | Status: DC | PRN

## 2020-10-08 MED ORDER — FENTANYL CITRATE (PF) 100 MCG/2ML IJ SOLN
INTRAMUSCULAR | Status: AC
  Filled 2020-10-08: qty 2

## 2020-10-08 MED ORDER — KETOROLAC TROMETHAMINE 30 MG/ML IJ SOLN
30.0000 mg | Freq: Four times a day (QID) | INTRAMUSCULAR | Status: DC
  Administered 2020-10-08 – 2020-10-09 (×3): 30 mg via INTRAVENOUS
  Filled 2020-10-08 (×3): qty 1

## 2020-10-08 MED ORDER — SODIUM CHLORIDE 0.9% FLUSH: 3.0000 mL | INTRAVENOUS | Status: DC | PRN

## 2020-10-08 MED ORDER — DIPHENHYDRAMINE HCL 12.5 MG/5ML PO ELIX
12.5000 mg | ORAL_SOLUTION | Freq: Four times a day (QID) | ORAL | Status: DC | PRN
  Filled 2020-10-08: qty 5

## 2020-10-08 MED ORDER — SODIUM CHLORIDE 0.9 % IV SOLN: INTRAVENOUS | Status: DC

## 2020-10-08 MED ORDER — IOHEXOL 300 MG/ML  SOLN
100.0000 mL | Freq: Once | INTRAMUSCULAR | Status: AC | PRN
  Administered 2020-10-08: 60 mL via INTRA_ARTERIAL

## 2020-10-08 MED ORDER — MIDAZOLAM HCL 2 MG/2ML IJ SOLN
INTRAMUSCULAR | Status: AC | PRN
  Administered 2020-10-08 (×6): 1 mg via INTRAVENOUS

## 2020-10-08 MED ORDER — DIPHENHYDRAMINE HCL 50 MG/ML IJ SOLN: 12.5000 mg | Freq: Four times a day (QID) | INTRAMUSCULAR | Status: DC | PRN

## 2020-10-08 MED ORDER — FENTANYL CITRATE (PF) 100 MCG/2ML IJ SOLN
INTRAMUSCULAR | Status: AC
  Filled 2020-10-08: qty 4

## 2020-10-08 MED ORDER — KETOROLAC TROMETHAMINE 30 MG/ML IJ SOLN: 30.0000 mg | Freq: Once | INTRAMUSCULAR | Status: DC

## 2020-10-08 MED ORDER — SODIUM CHLORIDE 0.9% FLUSH: 9.0000 mL | INTRAVENOUS | Status: DC | PRN

## 2020-10-08 MED ORDER — SODIUM CHLORIDE 0.9% FLUSH: 3.0000 mL | Freq: Two times a day (BID) | INTRAVENOUS | Status: DC

## 2020-10-08 MED ORDER — HYDROMORPHONE 1 MG/ML IV SOLN
INTRAVENOUS | Status: DC
  Administered 2020-10-08: 0.3 mg via INTRAVENOUS
  Administered 2020-10-08: 0.6 mg via INTRAVENOUS
  Administered 2020-10-09 (×3): 0 mg via INTRAVENOUS
  Filled 2020-10-08 (×12): qty 30

## 2020-10-08 MED ORDER — ONDANSETRON HCL 4 MG/2ML IJ SOLN
4.0000 mg | Freq: Four times a day (QID) | INTRAMUSCULAR | Status: DC | PRN
  Administered 2020-10-08: 4 mg via INTRAVENOUS

## 2020-10-08 NOTE — H&P (Signed)
Referring Physician(s): Harraway-Smith,C  Supervising Physician: Aletta Edouard  Patient Status:  [facility] TBA  Chief Complaint: Symptomatic uterine fibroids   Subjective: Patient familiar to IR service from consultation with Dr. Kathlene Cote on 07/10/2020 to discuss treatment options for symptomatic uterine fibroids (see previous consult note for additional details).  Patient was felt to be higher risk for hysterectomy due to her obesity.  Following discussions with Dr. Kathlene Cote she was deemed an appropriate candidate for bilateral uterine artery embolization and presents today for the procedure.  She currently denies fever, headache, chest pain, dyspnea, cough, abdominal/back pain, nausea, vomiting, hematuria/dysuria.  She does experience some short-term menorrhagia with menses as well as periodic pain in the upper pelvis.  Past Medical History:  Diagnosis Date   Allergy    Arthritis    Hernia, umbilical    Morbid obesity ([place])    Past Surgical History:  Procedure Laterality Date   COLONOSCOPY WITH PROPOFOL N/A 06/13/2020   Procedure: COLONOSCOPY WITH PROPOFOL;  Surgeon: Carol Ada, MD;  Location: [facility];  Service: Endoscopy;  Laterality: N/A;   HEMOSTASIS CLIP PLACEMENT  06/13/2020   Procedure: HEMOSTASIS CLIP PLACEMENT;  Surgeon: Carol Ada, MD;  Location: [facility];  Service: Endoscopy;;   HERNIA REPAIR     HYSTEROSCOPY WITH D & C N/A 08/26/2014   Procedure: DILATATION AND CURETTAGE /HYSTEROSCOPY and cervical repair of cervical laceration ;  Surgeon: Delsa Bern, MD;  Location: [facility];  Service: Gynecology;  Laterality: N/A;   IR RADIOLOGIST EVAL & MGMT  07/10/2020   IR RADIOLOGIST EVAL & MGMT  07/31/2020   IR RADIOLOGIST EVAL & MGMT  09/09/2020   POLYPECTOMY  06/13/2020   Procedure: POLYPECTOMY;  Surgeon: Carol Ada, MD;  Location: [facility];  Service: Endoscopy;;   UMBILICAL HERNIA REPAIR     WISDOM TOOTH EXTRACTION        Allergies: Amoxicillin and  Penicillins  Medications: Prior to Admission medications   Medication Sig Start Date End Date Taking? Authorizing Provider  acetaminophen (TYLENOL) 325 MG tablet Take 650 mg by mouth every 6 (six) hours as needed for moderate pain.   Yes [provider]  amLODipine (NORVASC) 10 MG tablet Take 1 tablet (10 mg total) by mouth daily. 09/15/20  Yes Troy Sine, MD  Calcium Carb-Cholecalciferol (CALCIUM 500 +D PO) Take 1 tablet by mouth daily.   Yes [provider]  Cholecalciferol (VITAMIN D) 2000 UNITS CAPS Take 2,000 Units by mouth daily.   Yes [provider]  CINNAMON PO Take 1 capsule by mouth daily.   Yes [provider]  magnesium oxide (MAG-OX) 400 MG tablet Take 400 mg by mouth daily.   Yes [provider]  meloxicam (MOBIC) 15 MG tablet TAKE 1 TABLET(15 MG) BY MOUTH DAILY 07/16/20  Yes Hyatt, Max T, DPM  Multiple Vitamin (MULTIVITAMIN) capsule Take 1 capsule by mouth daily.   Yes [provider]  rosuvastatin (CRESTOR) 10 MG tablet Take 1 tablet (10 mg total) by mouth daily. 09/15/20  Yes Troy Sine, MD  Vitamin D, Ergocalciferol, (DRISDOL) 1.25 MG (50000 UNIT) CAPS capsule Take 1 capsule (50,000 Units total) by mouth every 7 (seven) days. 07/22/20  Yes Ghumman, Ramandeep, NP  meloxicam (MOBIC) 15 MG tablet TAKE 1 TABLET(15 MG) BY MOUTH DAILY 07/16/20   [place], Max T, [place]     Vital Signs: Vitals:   10/08/20 0852  BP: (!) 147/82  Pulse: 78  Resp: 18  Temp: 98.6 F (37 C)  SpO2: 99%    LMP 09/24/2020   Physical Exam awake, alert.  Chest clear to auscultation bilaterally.  Heart with regular rate and rhythm.  Abdomen obese, soft, positive bowel sounds, nontender.  No significant lower extremity edema.  Imaging: No results found.  Labs:  CBC: Recent Labs    06/16/20 1109 07/17/20 1127  WBC 4.2 5.0  HGB 13.3 13.5  HCT 42.3 41.3  PLT 257 275    COAGS: No results for input(s): INR, APTT in the last 8760  hours.  BMP: Recent Labs    06/16/20 1109 07/17/20 1127  NA 137 141  K 4.5 4.4  CL 101 100  CO2 21 24  GLUCOSE 77 81  BUN 14 11  CALCIUM 9.5 9.5  CREATININE 0.59 0.60    LIVER FUNCTION TESTS: Recent Labs    07/17/20 1127  BILITOT 0.3  AST 18  ALT 16  ALKPHOS 56  PROT 7.6  ALBUMIN 4.5    Assessment and Plan: Patient familiar to IR service from consultation with Dr. Kathlene Cote on 07/10/2020 to discuss treatment options for symptomatic uterine fibroids (see previous consult note for additional details).  Patient was felt to be higher risk for hysterectomy due to her obesity.  Following discussions with Dr. Kathlene Cote she was deemed an appropriate candidate for bilateral uterine artery embolization and presents today for the procedure. Risks and benefits of procedure were discussed with the patient including, but not limited to bleeding, infection, vascular injury or contrast induced renal failure.  This interventional procedure involves the use of X-rays and because of the nature of the planned procedure, it is possible that we will have prolonged use of X-ray fluoroscopy.  Potential radiation risks to you include (but are not limited to) the following: - A slightly elevated risk for cancer  several years later in life. This risk is typically less than 0.5% percent. This risk is low in comparison to the normal incidence of human cancer, which is 33% for women and 50% for men according to the [place]. - Radiation induced injury can include skin redness, resembling a rash, tissue breakdown / ulcers and hair loss (which can be temporary or permanent).   The likelihood of either of these occurring depends on the difficulty of the procedure and whether you are sensitive to radiation due to previous procedures, disease, or genetic conditions.   IF your procedure requires a prolonged use of radiation, you will be notified and given written instructions for further action.   It is your responsibility to monitor the irradiated area for the 2 weeks following the procedure and to notify your physician if you are concerned that you have suffered a radiation induced injury.    All of the patient's questions were answered, patient is agreeable to proceed.  Consent signed and in chart.  Post procedure patient will be admitted to the hospital for overnight observation for pain control.    Electronically Signed: D. Rowe Robert, PA-C 10/08/2020, 8:51 AM   I spent a total of 30 minutes at the the patient's bedside AND on the patient's hospital floor or unit, greater than 50% of which was counseling/coordinating care for bilateral uterine artery embolization

## 2020-10-08 NOTE — Procedures (Signed)
Interventional Radiology Procedure Note  Procedure: Uterine fibroid embolization  Complications: None  Estimated Blood Loss: < 10 mL  Access: Right CFA, 5 Fr sheath  Findings: Bilateral internal iliac and uterine arteriography demonstrate hypertrophic bilateral uterine arteries supplying fibroids. Bilateral uterine artery embolization: 2 vials 500-700 micron and 2 vials 700-900 micron Embospheres in left uterine artery. 2 vials 500-700 micron Embospheres in right uterine artery. Good stasis achieved.  Closure: Celt device  Plan: Overnight observation  Amanda Paul, M.D Pager:  [phone]

## 2020-10-09 DIAGNOSIS — D259 Leiomyoma of uterus, unspecified: Secondary | ICD-10-CM | POA: Diagnosis not present

## 2020-10-09 MED ORDER — ONDANSETRON HCL 4 MG PO TABS: 8.0000 mg | ORAL_TABLET | Freq: Three times a day (TID) | ORAL | Status: DC | PRN

## 2020-10-09 MED ORDER — IBUPROFEN 600 MG PO TABS: ORAL_TABLET | ORAL | 0 refills | Status: DC

## 2020-10-09 MED ORDER — DOCUSATE SODIUM 100 MG PO CAPS: ORAL_CAPSULE | ORAL | 0 refills | Status: DC

## 2020-10-09 MED ORDER — OXYCODONE-ACETAMINOPHEN 5-325 MG PO TABS: 1.0000 | ORAL_TABLET | ORAL | Status: DC | PRN

## 2020-10-09 MED ORDER — OXYCODONE-ACETAMINOPHEN 5-325 MG PO TABS: 1.0000 | ORAL_TABLET | ORAL | 0 refills | Status: DC | PRN

## 2020-10-09 MED ORDER — ONDANSETRON HCL 8 MG PO TABS: 8.0000 mg | ORAL_TABLET | Freq: Three times a day (TID) | ORAL | 0 refills | Status: DC | PRN

## 2020-10-09 NOTE — Discharge Instructions (Signed)
Uterine Artery Embolization, Care After  This sheet gives you information about how to care for yourself after your procedure. Your health care provider may also give you more specific instructions. If you have problems or questions, contact your health care provider.  What can I expect after the procedure? After your procedure, it is common to have pelvic pain and discomfort. You may be given prescription pain medicines to control this (Percocet, Ibuprofen).   Follow these instructions at home: Puncture site care Follow instructions from your health care provider about how to take care of your incision. Make sure you:  - Wash your hands with soap and water before you change your bandage (dressing). If soap and water are not available, use hand sanitizer.  - Ok to shower 48 hours following procedure. Recommend showering with bandage on, remove bandage immediately after showering and pat area dry. No further dressing changes needed after this- ensure area remains clean and dry until fully healed.  - No submerging (swimming, bathing) for 7 days post-procedure. Check your puncture site area every day for signs of infection. Check for: - Redness, swelling, or pain. - Fluid or blood. - Warmth. - Pus or a bad smell. Activity Rest as often as needing during the first few days of recovery. No stooping, bending, or lifting more than 10 pounds for 1 week.  General instructions To prevent or treat constipation while you are taking prescription pain medicine, your health care provider may recommend that you: - Drink enough fluid to keep your urine clear or pale yellow. - Take over-the-counter or prescription medicines (Colace). - Eat foods that are high in fiber, such as fresh fruits and vegetables, whole grains, and beans. - Limit foods that are high in fat and processed sugars, such as fried and sweet foods. - Do not use any products that contain nicotine or tobacco, such as cigarettes and  e-cigarettes. If you need help quitting, ask your health care provider. - Keep all follow-up visits as told by your health care provider. This is important.  4 week televisit with the doctor who performed the procedure. Our office will call you to set up the appointment.   Contact a health care provider if: You cannot pass gas. You are unable to have a bowel movement within 3 days. You have a skin rash. Your vaginal bleeding becomes severe or has a foul smell.  Get help right away if: You have a fever. You have severe or lasting pain in your abdomen, shoulder, or back. You have trouble swallowing or breathing. You have severe weakness or dizziness. You have chest pain or shortness of breath.   This information is not intended to replace advice given to you by your health care provider. Make sure you discuss any questions you have with your health care provider.

## 2020-10-09 NOTE — Progress Notes (Signed)
IR.  History of pelvic arteriogram with bilateral uterine artery embolization using Embospheres via right femoral approach 10/08/2020 by Dr. Kathlene Cote.  Patient evaluated bedside this AM. Awake and alert sitting in bed. States she is not currently in pain, only used PCA a few times last evening. States she was very nauseated last evening (no vomiting) so did not eat dinner but is able to keep crackers/water down at this time. States she has some vaginal bleeding described as "end of period" (light pink in nature), not soaking through pads. Foley has been removed.  Afebrile. Heart RRR. Lungs CTA. Right femoral puncture site soft with minimal ooze from track, no hematoma noted. Distal pulses (DPs) palpable bilaterally.  D/C PCA and additional IV meds- transition to PO Percocet/Zofran PRN. Change right femoral puncture site pressure dressing (gauze + tegaderm) and monitor for bleeding. Monitor vaginal bleeding- minimal bleed ok as long as not foul smelling and patient remains afebrile. Encourage PO intake and ambulation. Possible D/C home later this afternoon if stable. Terri, RN made aware of above. IR to follow.   Bea Graff Jvion Turgeon, PA-C 10/09/2020, 9:25 AM

## 2020-10-09 NOTE — Discharge Summary (Signed)
Patient ID: Amanda Paul MRN: [id number] DOB/AGE: 47-Jul-1977 47 y.o.  Admit date: 10/08/2020 Discharge date: 10/09/2020  Supervising Physician: Markus Daft  Patient Status: [facility] - In-pt  Admission Diagnoses: Uterine leiomyoma  Discharge Diagnoses:  Active Problems:   Uterine leiomyoma   Discharged Condition: stable  Hospital Course:  Patient presented to [facility] 10/08/2020 for an image-guided pelvic arteriogram with bilateral uterine artery embolization using Embospheres via right femoral approach by Dr. Kathlene Cote. Procedure occurred without major complications and patient was transferred to floor in stable condition (VSS, right femoral puncture site stable) for overnight observation. Overnight, patient did well with pain control (only used PCA a few times). She did struggle with nausea, however no vomiting and was able to tolerate a few crackers without difficulty. No additional events occurred overnight.  This AM, patient awake and alert laying in bed. Foley removed, urinating without difficulty. She has some vaginal bleeding described as "end of period" (light pink in nature), not soaking through pads. Right femoral puncture site stable. PCA was discontinued and patient was switched to PO pain medications which has been able to control her pain (currently states no pain). She has been able to tolerate PO intake without difficulty. Ambulating without difficulty. Plan to discharge home today and follow-up with Dr. Kathlene Cote for televisit 4 weeks after discharge.   Consults: None  Significant Diagnostic Studies: IR Angiogram Pelvis Selective Or Supraselective  Result Date: 10/08/2020 CLINICAL DATA:  Symptomatic uterine fibroids and presentation for uterine fibroid embolization procedure. EXAM: UTERINE FIBROID EMBOLIZATION ANESTHESIA/SEDATION: Moderate (conscious) sedation was employed during this procedure. A total of Versed 6.0 mg and Fentanyl 300 mcg was administered  intravenously. Moderate Sedation Time: 97 minutes. The patient's level of consciousness and vital signs were monitored continuously by radiology nursing throughout the procedure under my direct supervision. FLUOROSCOPY TIME:  32 minutes and 18 seconds.  3,222 mGy. MEDICATIONS: 1 g IV vancomycin, 30 mg IV Toradol PROCEDURE: The procedure, risks, benefits, and alternatives were explained to the patient. Questions regarding the procedure were encouraged and answered. The patient understands and consents to the procedure. A time-out was performed prior to initiating the procedure. The right groin was prepped with chlorhexidine in a sterile fashion, and a sterile drape was applied covering the operative field. A sterile gown and sterile gloves were used for the procedure. Local anesthesia was provided with 1% Lidocaine. Ultrasound was used to confirm patency of the right common femoral artery. After small skin incision, a 21 gauge needle was advanced into the right common femoral artery under direct ultrasound guidance. Ultrasound image documentation was performed. After establishing guide wire access, a 5-French sheath was placed. A 5 Fr diagnostic catheter was advanced over a guidewire into the distal abdominal aorta. The catheter was then used to selectively catheterize the left common iliac artery followed by the left internal iliac artery. Selective pelvic arteriography was performed at the level of the left internal iliac artery. A 2.8 Fr coaxial microcatheter was then introduced through the diagnostic catheter and used to selectively catheterize the left uterine artery. Selective arteriography of the left uterine artery was performed through the microcatheter. Left uterine artery embolization was then performed with installation of microsphere particles. Follow-up arteriography was performed after embolization. The microcatheter was removed. The diagnostic catheter was then retracted and used to selectively  catheterize the right internal iliac artery. Selective pelvic arteriography was performed at the level of the right internal iliac artery. The microcatheter was then reintroduced and the right uterine artery was  selectively catheterized. Selective arteriography of the right uterine artery was then performed through the microcatheter. Right uterine artery embolization was then performed with installation of microsphere particles. Follow-up arteriography was performed after embolization. Right femoral arteriotomy hemostasis: Celt device COMPLICATIONS: None. FINDINGS: Left internal iliac arteriography shows a massive uterine artery extending across the midline into the right pelvis and supplying predominantly the inferior aspect of the uterus. Selective uterine arteriography on the left also demonstrates a tremendous amount of vascular supply to fibroids via the left uterine artery. Left uterine artery embolization was performed utilizing 2 vials of 500-700 micron sized and 2 vials of 700-900 micron sized Embosphere particles. Completion arteriography demonstrates adequate occlusion of branches supplying uterine fibroids. Right internal iliac arteriography demonstrates an enlarged uterine artery coursing superiorly to supply the superior aspect of an enlarged uterus and fibroids. Selective uterine arteriography on the right demonstrates rapid flow and multiple branches supplying the superior aspect of the uterus. Right uterine artery embolization was performed utilizing 2 vials of 700-900 micron sized Embosphere particles. Completion arteriography demonstrates adequate occlusion of branches supplying uterine fibroids. Adequate hemostasis was achieved at the femoral arteriotomy site. IMPRESSION: Successful bilateral uterine artery embolization to treat symptomatic uterine fibroid disease. Adequate occlusion of branch vessels supplying uterine fibroids was achieved with microsphere particle embolization. The patient was  admitted for overnight observation for treatment of post embolization symptoms. Electronically Signed   By: Aletta Edouard M.D.   On: 10/08/2020 16:11   IR Angiogram Pelvis Selective Or Supraselective  Result Date: 10/08/2020 CLINICAL DATA:  Symptomatic uterine fibroids and presentation for uterine fibroid embolization procedure. EXAM: UTERINE FIBROID EMBOLIZATION ANESTHESIA/SEDATION: Moderate (conscious) sedation was employed during this procedure. A total of Versed 6.0 mg and Fentanyl 300 mcg was administered intravenously. Moderate Sedation Time: 97 minutes. The patient's level of consciousness and vital signs were monitored continuously by radiology nursing throughout the procedure under my direct supervision. FLUOROSCOPY TIME:  32 minutes and 18 seconds.  3,222 mGy. MEDICATIONS: 1 g IV vancomycin, 30 mg IV Toradol PROCEDURE: The procedure, risks, benefits, and alternatives were explained to the patient. Questions regarding the procedure were encouraged and answered. The patient understands and consents to the procedure. A time-out was performed prior to initiating the procedure. The right groin was prepped with chlorhexidine in a sterile fashion, and a sterile drape was applied covering the operative field. A sterile gown and sterile gloves were used for the procedure. Local anesthesia was provided with 1% Lidocaine. Ultrasound was used to confirm patency of the right common femoral artery. After small skin incision, a 21 gauge needle was advanced into the right common femoral artery under direct ultrasound guidance. Ultrasound image documentation was performed. After establishing guide wire access, a 5-French sheath was placed. A 5 Fr diagnostic catheter was advanced over a guidewire into the distal abdominal aorta. The catheter was then used to selectively catheterize the left common iliac artery followed by the left internal iliac artery. Selective pelvic arteriography was performed at the level of the  left internal iliac artery. A 2.8 Fr coaxial microcatheter was then introduced through the diagnostic catheter and used to selectively catheterize the left uterine artery. Selective arteriography of the left uterine artery was performed through the microcatheter. Left uterine artery embolization was then performed with installation of microsphere particles. Follow-up arteriography was performed after embolization. The microcatheter was removed. The diagnostic catheter was then retracted and used to selectively catheterize the right internal iliac artery. Selective pelvic arteriography was performed at the level of  the right internal iliac artery. The microcatheter was then reintroduced and the right uterine artery was selectively catheterized. Selective arteriography of the right uterine artery was then performed through the microcatheter. Right uterine artery embolization was then performed with installation of microsphere particles. Follow-up arteriography was performed after embolization. Right femoral arteriotomy hemostasis: Celt device COMPLICATIONS: None. FINDINGS: Left internal iliac arteriography shows a massive uterine artery extending across the midline into the right pelvis and supplying predominantly the inferior aspect of the uterus. Selective uterine arteriography on the left also demonstrates a tremendous amount of vascular supply to fibroids via the left uterine artery. Left uterine artery embolization was performed utilizing 2 vials of 500-700 micron sized and 2 vials of 700-900 micron sized Embosphere particles. Completion arteriography demonstrates adequate occlusion of branches supplying uterine fibroids. Right internal iliac arteriography demonstrates an enlarged uterine artery coursing superiorly to supply the superior aspect of an enlarged uterus and fibroids. Selective uterine arteriography on the right demonstrates rapid flow and multiple branches supplying the superior aspect of the uterus.  Right uterine artery embolization was performed utilizing 2 vials of 700-900 micron sized Embosphere particles. Completion arteriography demonstrates adequate occlusion of branches supplying uterine fibroids. Adequate hemostasis was achieved at the femoral arteriotomy site. IMPRESSION: Successful bilateral uterine artery embolization to treat symptomatic uterine fibroid disease. Adequate occlusion of branch vessels supplying uterine fibroids was achieved with microsphere particle embolization. The patient was admitted for overnight observation for treatment of post embolization symptoms. Electronically Signed   By: Aletta Edouard M.D.   On: 10/08/2020 16:11   IR Angiogram Selective Each Additional Vessel  Result Date: 10/08/2020 CLINICAL DATA:  Symptomatic uterine fibroids and presentation for uterine fibroid embolization procedure. EXAM: UTERINE FIBROID EMBOLIZATION ANESTHESIA/SEDATION: Moderate (conscious) sedation was employed during this procedure. A total of Versed 6.0 mg and Fentanyl 300 mcg was administered intravenously. Moderate Sedation Time: 97 minutes. The patient's level of consciousness and vital signs were monitored continuously by radiology nursing throughout the procedure under my direct supervision. FLUOROSCOPY TIME:  32 minutes and 18 seconds.  3,222 mGy. MEDICATIONS: 1 g IV vancomycin, 30 mg IV Toradol PROCEDURE: The procedure, risks, benefits, and alternatives were explained to the patient. Questions regarding the procedure were encouraged and answered. The patient understands and consents to the procedure. A time-out was performed prior to initiating the procedure. The right groin was prepped with chlorhexidine in a sterile fashion, and a sterile drape was applied covering the operative field. A sterile gown and sterile gloves were used for the procedure. Local anesthesia was provided with 1% Lidocaine. Ultrasound was used to confirm patency of the right common femoral artery. After small  skin incision, a 21 gauge needle was advanced into the right common femoral artery under direct ultrasound guidance. Ultrasound image documentation was performed. After establishing guide wire access, a 5-French sheath was placed. A 5 Fr diagnostic catheter was advanced over a guidewire into the distal abdominal aorta. The catheter was then used to selectively catheterize the left common iliac artery followed by the left internal iliac artery. Selective pelvic arteriography was performed at the level of the left internal iliac artery. A 2.8 Fr coaxial microcatheter was then introduced through the diagnostic catheter and used to selectively catheterize the left uterine artery. Selective arteriography of the left uterine artery was performed through the microcatheter. Left uterine artery embolization was then performed with installation of microsphere particles. Follow-up arteriography was performed after embolization. The microcatheter was removed. The diagnostic catheter was then retracted and used to  selectively catheterize the right internal iliac artery. Selective pelvic arteriography was performed at the level of the right internal iliac artery. The microcatheter was then reintroduced and the right uterine artery was selectively catheterized. Selective arteriography of the right uterine artery was then performed through the microcatheter. Right uterine artery embolization was then performed with installation of microsphere particles. Follow-up arteriography was performed after embolization. Right femoral arteriotomy hemostasis: Celt device COMPLICATIONS: None. FINDINGS: Left internal iliac arteriography shows a massive uterine artery extending across the midline into the right pelvis and supplying predominantly the inferior aspect of the uterus. Selective uterine arteriography on the left also demonstrates a tremendous amount of vascular supply to fibroids via the left uterine artery. Left uterine artery  embolization was performed utilizing 2 vials of 500-700 micron sized and 2 vials of 700-900 micron sized Embosphere particles. Completion arteriography demonstrates adequate occlusion of branches supplying uterine fibroids. Right internal iliac arteriography demonstrates an enlarged uterine artery coursing superiorly to supply the superior aspect of an enlarged uterus and fibroids. Selective uterine arteriography on the right demonstrates rapid flow and multiple branches supplying the superior aspect of the uterus. Right uterine artery embolization was performed utilizing 2 vials of 700-900 micron sized Embosphere particles. Completion arteriography demonstrates adequate occlusion of branches supplying uterine fibroids. Adequate hemostasis was achieved at the femoral arteriotomy site. IMPRESSION: Successful bilateral uterine artery embolization to treat symptomatic uterine fibroid disease. Adequate occlusion of branch vessels supplying uterine fibroids was achieved with microsphere particle embolization. The patient was admitted for overnight observation for treatment of post embolization symptoms. Electronically Signed   By: Aletta Edouard M.D.   On: 10/08/2020 16:11   IR US Guide Vasc Access Right  Result Date: 10/08/2020 CLINICAL DATA:  Symptomatic uterine fibroids and presentation for uterine fibroid embolization procedure. EXAM: UTERINE FIBROID EMBOLIZATION ANESTHESIA/SEDATION: Moderate (conscious) sedation was employed during this procedure. A total of Versed 6.0 mg and Fentanyl 300 mcg was administered intravenously. Moderate Sedation Time: 97 minutes. The patient's level of consciousness and vital signs were monitored continuously by radiology nursing throughout the procedure under my direct supervision. FLUOROSCOPY TIME:  32 minutes and 18 seconds.  3,222 mGy. MEDICATIONS: 1 g IV vancomycin, 30 mg IV Toradol PROCEDURE: The procedure, risks, benefits, and alternatives were explained to the patient.  Questions regarding the procedure were encouraged and answered. The patient understands and consents to the procedure. A time-out was performed prior to initiating the procedure. The right groin was prepped with chlorhexidine in a sterile fashion, and a sterile drape was applied covering the operative field. A sterile gown and sterile gloves were used for the procedure. Local anesthesia was provided with 1% Lidocaine. Ultrasound was used to confirm patency of the right common femoral artery. After small skin incision, a 21 gauge needle was advanced into the right common femoral artery under direct ultrasound guidance. Ultrasound image documentation was performed. After establishing guide wire access, a 5-French sheath was placed. A 5 Fr diagnostic catheter was advanced over a guidewire into the distal abdominal aorta. The catheter was then used to selectively catheterize the left common iliac artery followed by the left internal iliac artery. Selective pelvic arteriography was performed at the level of the left internal iliac artery. A 2.8 Fr coaxial microcatheter was then introduced through the diagnostic catheter and used to selectively catheterize the left uterine artery. Selective arteriography of the left uterine artery was performed through the microcatheter. Left uterine artery embolization was then performed with installation of microsphere particles. Follow-up arteriography was  performed after embolization. The microcatheter was removed. The diagnostic catheter was then retracted and used to selectively catheterize the right internal iliac artery. Selective pelvic arteriography was performed at the level of the right internal iliac artery. The microcatheter was then reintroduced and the right uterine artery was selectively catheterized. Selective arteriography of the right uterine artery was then performed through the microcatheter. Right uterine artery embolization was then performed with installation of  microsphere particles. Follow-up arteriography was performed after embolization. Right femoral arteriotomy hemostasis: Celt device COMPLICATIONS: None. FINDINGS: Left internal iliac arteriography shows a massive uterine artery extending across the midline into the right pelvis and supplying predominantly the inferior aspect of the uterus. Selective uterine arteriography on the left also demonstrates a tremendous amount of vascular supply to fibroids via the left uterine artery. Left uterine artery embolization was performed utilizing 2 vials of 500-700 micron sized and 2 vials of 700-900 micron sized Embosphere particles. Completion arteriography demonstrates adequate occlusion of branches supplying uterine fibroids. Right internal iliac arteriography demonstrates an enlarged uterine artery coursing superiorly to supply the superior aspect of an enlarged uterus and fibroids. Selective uterine arteriography on the right demonstrates rapid flow and multiple branches supplying the superior aspect of the uterus. Right uterine artery embolization was performed utilizing 2 vials of 700-900 micron sized Embosphere particles. Completion arteriography demonstrates adequate occlusion of branches supplying uterine fibroids. Adequate hemostasis was achieved at the femoral arteriotomy site. IMPRESSION: Successful bilateral uterine artery embolization to treat symptomatic uterine fibroid disease. Adequate occlusion of branch vessels supplying uterine fibroids was achieved with microsphere particle embolization. The patient was admitted for overnight observation for treatment of post embolization symptoms. Electronically Signed   By: Aletta Edouard M.D.   On: 10/08/2020 16:11   IR EMBO TUMOR ORGAN ISCHEMIA INFARCT INC GUIDE ROADMAPPING  Result Date: 10/08/2020 CLINICAL DATA:  Symptomatic uterine fibroids and presentation for uterine fibroid embolization procedure. EXAM: UTERINE FIBROID EMBOLIZATION ANESTHESIA/SEDATION:  Moderate (conscious) sedation was employed during this procedure. A total of Versed 6.0 mg and Fentanyl 300 mcg was administered intravenously. Moderate Sedation Time: 97 minutes. The patient's level of consciousness and vital signs were monitored continuously by radiology nursing throughout the procedure under my direct supervision. FLUOROSCOPY TIME:  32 minutes and 18 seconds.  3,222 mGy. MEDICATIONS: 1 g IV vancomycin, 30 mg IV Toradol PROCEDURE: The procedure, risks, benefits, and alternatives were explained to the patient. Questions regarding the procedure were encouraged and answered. The patient understands and consents to the procedure. A time-out was performed prior to initiating the procedure. The right groin was prepped with chlorhexidine in a sterile fashion, and a sterile drape was applied covering the operative field. A sterile gown and sterile gloves were used for the procedure. Local anesthesia was provided with 1% Lidocaine. Ultrasound was used to confirm patency of the right common femoral artery. After small skin incision, a 21 gauge needle was advanced into the right common femoral artery under direct ultrasound guidance. Ultrasound image documentation was performed. After establishing guide wire access, a 5-French sheath was placed. A 5 Fr diagnostic catheter was advanced over a guidewire into the distal abdominal aorta. The catheter was then used to selectively catheterize the left common iliac artery followed by the left internal iliac artery. Selective pelvic arteriography was performed at the level of the left internal iliac artery. A 2.8 Fr coaxial microcatheter was then introduced through the diagnostic catheter and used to selectively catheterize the left uterine artery. Selective arteriography of the left uterine artery was  performed through the microcatheter. Left uterine artery embolization was then performed with installation of microsphere particles. Follow-up arteriography was  performed after embolization. The microcatheter was removed. The diagnostic catheter was then retracted and used to selectively catheterize the right internal iliac artery. Selective pelvic arteriography was performed at the level of the right internal iliac artery. The microcatheter was then reintroduced and the right uterine artery was selectively catheterized. Selective arteriography of the right uterine artery was then performed through the microcatheter. Right uterine artery embolization was then performed with installation of microsphere particles. Follow-up arteriography was performed after embolization. Right femoral arteriotomy hemostasis: Celt device COMPLICATIONS: None. FINDINGS: Left internal iliac arteriography shows a massive uterine artery extending across the midline into the right pelvis and supplying predominantly the inferior aspect of the uterus. Selective uterine arteriography on the left also demonstrates a tremendous amount of vascular supply to fibroids via the left uterine artery. Left uterine artery embolization was performed utilizing 2 vials of 500-700 micron sized and 2 vials of 700-900 micron sized Embosphere particles. Completion arteriography demonstrates adequate occlusion of branches supplying uterine fibroids. Right internal iliac arteriography demonstrates an enlarged uterine artery coursing superiorly to supply the superior aspect of an enlarged uterus and fibroids. Selective uterine arteriography on the right demonstrates rapid flow and multiple branches supplying the superior aspect of the uterus. Right uterine artery embolization was performed utilizing 2 vials of 700-900 micron sized Embosphere particles. Completion arteriography demonstrates adequate occlusion of branches supplying uterine fibroids. Adequate hemostasis was achieved at the femoral arteriotomy site. IMPRESSION: Successful bilateral uterine artery embolization to treat symptomatic uterine fibroid disease.  Adequate occlusion of branch vessels supplying uterine fibroids was achieved with microsphere particle embolization. The patient was admitted for overnight observation for treatment of post embolization symptoms. Electronically Signed   By: Aletta Edouard M.D.   On: 10/08/2020 16:11   IR Radiologist Eval & Mgmt  Result Date: 09/09/2020 Please refer to notes tab for details about interventional procedure. (Op Note)   Treatments: Endovascular embolization of bilateral uterine arteries using Embospheres  Discharge Exam: Blood pressure 138/82, pulse 80, temperature 98.7 F (37.1 C), temperature source Oral, resp. rate 18, height 5\' 5"  (1.651 m), weight (!) 311 lb (141.1 kg), last menstrual period 09/24/2020, SpO2 96 %. Physical Exam Vitals and nursing note reviewed.  Constitutional:      General: She is not in acute distress.    Appearance: She is obese.  Cardiovascular:     Rate and Rhythm: Normal rate and regular rhythm.     Heart sounds: Normal heart sounds. No murmur heard. Pulmonary:     Effort: Pulmonary effort is normal. No respiratory distress.     Breath sounds: Normal breath sounds. No wheezing.  Skin:    General: Skin is warm and dry.     Comments: Right femoral puncture site soft without active bleeding or hematoma. Distal pulses (DPs) palpable bilaterally.  Neurological:     Mental Status: She is alert and oriented to person, place, and time.    Disposition: Discharge disposition: 01-Home or Self Care       Discharge Instructions     Call MD for:  difficulty breathing, headache or visual disturbances   Complete by: As directed    Call MD for:  extreme fatigue   Complete by: As directed    Call MD for:  hives   Complete by: As directed    Call MD for:  persistant dizziness or light-headedness   Complete by: As directed  Call MD for:  persistant nausea and vomiting   Complete by: As directed    Call MD for:  redness, tenderness, or signs of infection (pain,  swelling, redness, odor or green/yellow discharge around incision site)   Complete by: As directed    Call MD for:  severe uncontrolled pain   Complete by: As directed    Diet - low sodium heart healthy   Complete by: As directed    Discharge instructions   Complete by: As directed    Discharge medication regimen: 1- Percocet: take one tablet once every 4 hours as needed for moderate-severe pain 2- Ibuprofen: take one tablet once every 6 hours for 5 days, then once every 6 hours as needed for moderate-severe pain 3- Zofran: take one tablet once every 8 hours as needed for nausea/vomiting 4- Colace: take one tablet once daily for 7 days  Please call our clinic if vaginal bleeding becomes severe or if has foul smell.   Increase activity slowly   Complete by: As directed    Lifting restrictions   Complete by: As directed    No lifting more than 10 pounds for one week post-procedure.   Remove dressing in 24 hours   Complete by: As directed    Ok to shower 48 hours post-procedure. Recommend showering with bandage on, remove bandage immediately after showering and pat area dry. No further dressing changes needed after this- ensure area remains clean/dry until fully healed. No submerging (swimming, bathing) for 7 days post-procedure.      Allergies as of 10/09/2020       Reactions   Amoxicillin Hives, Itching, Swelling   Swollen eyes and throat.   Penicillins Hives, Swelling        Medication List     STOP taking these medications    Vitamin D (Ergocalciferol) 1.25 MG (50000 UNIT) Caps capsule Commonly known as: DRISDOL       TAKE these medications    acetaminophen 325 MG tablet Commonly known as: TYLENOL Take 650 mg by mouth every 6 (six) hours as needed for moderate pain.   amLODipine 10 MG tablet Commonly known as: NORVASC Take 1 tablet (10 mg total) by mouth daily.   CALCIUM 500 +D PO Take 1 tablet by mouth daily.   CINNAMON PO Take 1 capsule by mouth  daily.   docusate sodium 100 MG capsule Commonly known as: Colace Take one tablet by mouth once daily for 7 days   ibuprofen 600 MG tablet Commonly known as: ADVIL Take one tablet by mouth once every 6 hours scheduled for 5 days, followed by once every 6 hours as needed for moderate-severe pain.   magnesium oxide 400 MG tablet Commonly known as: MAG-OX Take 400 mg by mouth daily.   meloxicam 15 MG tablet Commonly known as: MOBIC TAKE 1 TABLET(15 MG) BY MOUTH DAILY What changed: See the new instructions.   multivitamin capsule Take 1 capsule by mouth daily.   ondansetron 8 MG tablet Commonly known as: ZOFRAN Take 1 tablet (8 mg total) by mouth every 8 (eight) hours as needed for nausea or vomiting.   oxyCODONE-acetaminophen 5-325 MG tablet Commonly known as: PERCOCET/ROXICET Take 1 tablet by mouth every 4 (four) hours as needed for moderate pain or severe pain.   rosuvastatin 10 MG tablet Commonly known as: CRESTOR Take 1 tablet (10 mg total) by mouth daily.   Vitamin D 50 MCG (2000 UT) Caps Take 2,000 Units by mouth daily.  Follow-up Information     Aletta Edouard, MD Follow up in 4 week(s).   Specialties: Interventional Radiology, Radiology Why: Please follow-up with Dr. Kathlene Cote for televisit 4 weeks after discharge. Our clinic will call you to set up this appointment. Contact information: [address] [place] [address] [phone]                  Electronically Signed: Earley Abide, PA-C 10/09/2020, 12:57 PM   I have spent Less Than 30 Minutes discharging [place].

## 2020-10-09 NOTE — Progress Notes (Signed)
Pt alert and oriented, tolerating diet. D/C instructions given, pt d/cd home. 

## 2020-10-16 ENCOUNTER — Other Ambulatory Visit: Payer: Self-pay

## 2020-10-16 ENCOUNTER — Ambulatory Visit (INDEPENDENT_AMBULATORY_CARE_PROVIDER_SITE_OTHER): Payer: No Typology Code available for payment source | Admitting: Nurse Practitioner

## 2020-10-16 VITALS — BP 132/76 | HR 70 | Temp 98.8°F | Ht 64.8 in | Wt 303.8 lb

## 2020-10-16 DIAGNOSIS — E782 Mixed hyperlipidemia: Secondary | ICD-10-CM

## 2020-10-16 DIAGNOSIS — R7303 Prediabetes: Secondary | ICD-10-CM | POA: Diagnosis not present

## 2020-10-16 DIAGNOSIS — I10 Essential (primary) hypertension: Secondary | ICD-10-CM

## 2020-10-16 DIAGNOSIS — Z6841 Body Mass Index (BMI) 40.0 and over, adult: Secondary | ICD-10-CM

## 2020-10-16 NOTE — Progress Notes (Signed)
I,Tianna Badgett,acting as a Education administrator for [employer], NP.,have documented all relevant documentation on the behalf of [employer], NP,as directed by  Bary Castilla, NP while in the presence of Bary Castilla, NP.   This visit occurred during the SARS-CoV-2 public health emergency.  Safety protocols were in place, including screening questions prior to the visit, additional usage of staff PPE, and extensive cleaning of exam room while observing appropriate contact time as indicated for disinfecting solutions.  Subjective:     Patient ID: Amanda Paul , female    DOB: 21-Feb-1976 , 45 y.o.   MRN: [id number]   Chief Complaint  Patient presents with   Hypertension    HPI  Patient is here for htn follow up. She has no other concerns. She does not want to take any medication right now for diabetes. She is working on her diet and exercise.   BP Readings from Last 3 Encounters: 10/16/20 : 132/76 10/09/20 : (!) 145/80 09/15/20 : 140/88    Hypertension Pertinent negatives include no chest pain, headaches or palpitations.    Past Medical History:  Diagnosis Date   Allergy    Arthritis    Hernia, umbilical    Morbid obesity ([place])      Family History  Problem Relation Age of Onset   Hypertension Mother      Current Outpatient Medications:    acetaminophen (TYLENOL) 325 MG tablet, Take 650 mg by mouth every 6 (six) hours as needed for moderate pain., Disp: , Rfl:    amLODipine (NORVASC) 10 MG tablet, Take 1 tablet (10 mg total) by mouth daily., Disp: 90 tablet, Rfl: 3   Calcium Carb-Cholecalciferol (CALCIUM 500 +D PO), Take 1 tablet by mouth daily., Disp: , Rfl:    Cholecalciferol (VITAMIN D) 2000 UNITS CAPS, Take 2,000 Units by mouth daily., Disp: , Rfl:    CINNAMON PO, Take 1 capsule by mouth daily., Disp: , Rfl:    docusate sodium (COLACE) 100 MG capsule, Take one tablet by mouth once daily for 7 days, Disp: 7 capsule, Rfl: 0   ibuprofen (ADVIL) 600  MG tablet, Take one tablet by mouth once every 6 hours scheduled for 5 days, followed by once every 6 hours as needed for moderate-severe pain., Disp: 30 tablet, Rfl: 0   magnesium oxide (MAG-OX) 400 MG tablet, Take 400 mg by mouth daily., Disp: , Rfl:    meloxicam (MOBIC) 15 MG tablet, TAKE 1 TABLET(15 MG) BY MOUTH DAILY, Disp: 30 tablet, Rfl: 3   Multiple Vitamin (MULTIVITAMIN) capsule, Take 1 capsule by mouth daily., Disp: , Rfl:    ondansetron (ZOFRAN) 8 MG tablet, Take 1 tablet (8 mg total) by mouth every 8 (eight) hours as needed for nausea or vomiting., Disp: 20 tablet, Rfl: 0   rosuvastatin (CRESTOR) 10 MG tablet, Take 1 tablet (10 mg total) by mouth daily., Disp: 90 tablet, Rfl: 3   Allergies  Allergen Reactions   Amoxicillin Hives, Itching and Swelling    Swollen eyes and throat.   Penicillins Hives and Swelling     Review of Systems  Constitutional: Negative.  Negative for chills and fatigue.  HENT:  Negative for congestion and sinus pain.   Respiratory: Negative.    Cardiovascular: Negative.  Negative for chest pain and palpitations.  Gastrointestinal: Negative.   Endocrine: Negative for polydipsia, polyphagia and polyuria.  Musculoskeletal:  Negative for arthralgias and myalgias.  Neurological: Negative.  Negative for dizziness, weakness and headaches.    Today's Vitals  10/16/20 0925  BP: 132/76  Pulse: 70  Temp: 98.8 F (37.1 C)  TempSrc: Oral  Weight: (!) 303 lb 12.8 oz (137.8 kg)  Height: 5' 4.8" (1.646 m)   Body mass index is 50.87 kg/m.  Wt Readings from Last 3 Encounters:  10/16/20 (!) 303 lb 12.8 oz (137.8 kg)  10/08/20 (!) 311 lb (141.1 kg)  09/15/20 (!) 318 lb (144.2 kg)    Objective:  Physical Exam Constitutional:      Appearance: Normal appearance. She is obese.  HENT:     Head: Normocephalic and atraumatic.  Cardiovascular:     Rate and Rhythm: Normal rate and regular rhythm.     Pulses: Normal pulses.     Heart sounds: Normal heart  sounds. No murmur heard. Pulmonary:     Effort: Pulmonary effort is normal. No respiratory distress.     Breath sounds: Normal breath sounds. No wheezing.  Skin:    General: Skin is warm and dry.     Capillary Refill: Capillary refill takes less than 2 seconds.  Neurological:     Mental Status: She is alert and oriented to person, place, and time.        Assessment And Plan:     1. Primary hypertension -Chronic, stable, continue meds.  -Limit the intake of processed foods and salt intake. You should increase your intake of green vegetables and fruits. Limit the use of alcohol. Limit fast foods and fried foods. Avoid high fatty saturated and trans fat foods. Keep yourself hydrated with drinking water. Avoid red meats. Eat lean meats instead. Exercise for atleast 30-45 min for atleast 4-5 times a week.   2. Prediabetes --Discussed with patient the importance of glycemic control and long term complications from uncontrolled diabetes. Discussed with the patient the importance of compliance with home glucose monitoring, diet which includes decrease amount of sugary drinks and foods. Importance of exercise was also discussed with the patient. Importance of eye exams, self foot care and compliance to office visits was also discussed with the patient.  - Hemoglobin A1c  3. Mixed hyperlipidemia -Currently on meds, chronic, stable.  --Chronic, stable  -Continue current meds, tolerating well.  -Encouraged to continue with a low fat diet avoiding fast food and fried food.  - Lipid panel  4. Class 3 severe obesity due to excess calories without serious comorbidity with body mass index (BMI) of 50.0 to 59.9 in adult [facility]) Advised patient on a healthy diet including avoiding fast food and red meats. Increase the intake of lean meats including grilled chicken and [place].  Drink a lot of water. Decrease intake of fatty foods. Exercise for 30-45 min. 4-5 a week to decrease the risk of cardiac event.    The patient was encouraged to call or send a message through [facility] for any questions or concerns.   Follow up: if symptoms persist or do not get better.   Side effects and appropriate use of all the medication(s) were discussed with the patient today. Patient advised to use the medication(s) as directed by their healthcare provider. The patient was encouraged to read, review, and understand all associated package inserts and contact our office with any questions or concerns. The patient accepts the risks of the treatment plan and had an opportunity to ask questions.   Patient was given opportunity to ask questions. Patient verbalized understanding of the plan and was able to repeat key elements of the plan. All questions were answered to their satisfaction.  Raman Amesha Bailey,  DNP   I, Raman Kaladin Noseworthy have reviewed all documentation for this visit. The documentation on 10/16/20 for the exam, diagnosis, procedures, and orders are all accurate and complete.     IF YOU HAVE BEEN REFERRED TO A SPECIALIST, IT MAY TAKE 1-2 WEEKS TO SCHEDULE/PROCESS THE REFERRAL. IF YOU HAVE NOT HEARD FROM US/SPECIALIST IN TWO WEEKS, PLEASE GIVE [place] A CALL AT [phone].   THE PATIENT IS ENCOURAGED TO PRACTICE SOCIAL DISTANCING DUE TO THE COVID-19 PANDEMIC.

## 2020-10-16 NOTE — Patient Instructions (Signed)

## 2020-10-17 LAB — LIPID PANEL
Chol/HDL Ratio: 2.8 ratio (ref 0.0–4.4)
Cholesterol, Total: 138 mg/dL (ref 100–199)
HDL: 49 mg/dL (ref >39)
LDL Chol Calc (NIH): 78 mg/dL (ref 0–99)
Triglycerides: 51 mg/dL (ref 0–149)
VLDL Cholesterol Cal: 11 mg/dL (ref 5–40)

## 2020-10-17 LAB — HEMOGLOBIN A1C
Est. average glucose Bld gHb Est-mCnc: 128 mg/dL
Hgb A1c MFr Bld: 6.1 % — ABNORMAL HIGH (ref 4.8–5.6)

## 2020-10-27 ENCOUNTER — Telehealth: Payer: Self-pay

## 2020-10-27 NOTE — Telephone Encounter (Signed)
Pt called stating she had a uterine fibroid embolization on 10/09/20, she states she started having pain over the weekend. I advised pt to take her tylenol and ibuprofen and continue to use a heating pad. Pt states the pain is better today since she was able to rest over the weekend. I advised pt to call if symptoms persists. Understanding was voiced. Derriana Oser l Maksymilian Mabey, CMA

## 2020-11-18 ENCOUNTER — Other Ambulatory Visit: Payer: Self-pay

## 2020-11-18 ENCOUNTER — Ambulatory Visit
Admission: RE | Admit: 2020-11-18 | Discharge: 2020-11-18 | Disposition: A | Discharge status: Home / Self Care | Payer: No Typology Code available for payment source | Source: Ambulatory Visit | Attending: Student | Admitting: Student

## 2020-11-18 ENCOUNTER — Encounter: Payer: Self-pay | Admitting: *Deleted

## 2020-11-18 DIAGNOSIS — D259 Leiomyoma of uterus, unspecified: Secondary | ICD-10-CM

## 2020-11-18 HISTORY — PX: IR RADIOLOGIST EVAL & MGMT: IMG5224

## 2020-12-02 NOTE — Progress Notes (Signed)
Chief Complaint: Patient was consulted remotely today (TeleHealth) for follow-up post uterine fibroid embolization.  History of Present Illness: Amanda Paul is a 45 y.o. female status post uterine fibroid embolization on 10/08/2020.  She was admitted overnight following the procedure and did well with some discomfort at home for approximately 3 to 4 days.  She is now pain-free.  She had a small amount of spotting but has not had a formal menstrual cycle since the procedure.  Her last menstrual cycle was in early June.  She denies fever or abnormal vaginal discharge.  She went back to work on 10/20/2020.  She feels that her previously palpable anterior fibroids are less "hard" and prominent compared to prior to embolization.  Past Medical History:  Diagnosis Date   Allergy    Arthritis    Hernia, umbilical    Morbid obesity ([place])     Past Surgical History:  Procedure Laterality Date   COLONOSCOPY WITH PROPOFOL N/A 06/13/2020   Procedure: COLONOSCOPY WITH PROPOFOL;  Surgeon: Amanda Ada, MD;  Location: [facility];  Service: Endoscopy;  Laterality: N/A;   HEMOSTASIS CLIP PLACEMENT  06/13/2020   Procedure: HEMOSTASIS CLIP PLACEMENT;  Surgeon: Amanda Ada, MD;  Location: [facility];  Service: Endoscopy;;   HERNIA REPAIR     HYSTEROSCOPY WITH D & C N/A 08/26/2014   Procedure: DILATATION AND CURETTAGE /HYSTEROSCOPY and cervical repair of cervical laceration ;  Surgeon: Amanda Bern, MD;  Location: [facility];  Service: Gynecology;  Laterality: N/A;   IR ANGIOGRAM PELVIS SELECTIVE OR SUPRASELECTIVE  10/08/2020   IR ANGIOGRAM PELVIS SELECTIVE OR SUPRASELECTIVE  10/08/2020   IR ANGIOGRAM SELECTIVE EACH ADDITIONAL VESSEL  10/08/2020   IR EMBO TUMOR ORGAN ISCHEMIA INFARCT INC GUIDE ROADMAPPING  10/08/2020   IR RADIOLOGIST EVAL & MGMT  07/10/2020   IR RADIOLOGIST EVAL & MGMT  07/31/2020   IR RADIOLOGIST EVAL & MGMT  09/09/2020   IR RADIOLOGIST EVAL & MGMT  11/18/2020   IR US GUIDE VASC  ACCESS RIGHT  10/08/2020   POLYPECTOMY  06/13/2020   Procedure: POLYPECTOMY;  Surgeon: Amanda Ada, MD;  Location: [facility];  Service: Endoscopy;;   UMBILICAL HERNIA REPAIR     WISDOM TOOTH EXTRACTION      Allergies: Amoxicillin and Penicillins  Medications: Prior to Admission medications   Medication Sig Start Date End Date Taking? Authorizing Provider  acetaminophen (TYLENOL) 325 MG tablet Take 650 mg by mouth every 6 (six) hours as needed for moderate pain.    [provider]  amLODipine (NORVASC) 10 MG tablet Take 1 tablet (10 mg total) by mouth daily. 09/15/20   Amanda Sine, MD  Calcium Carb-Cholecalciferol (CALCIUM 500 +D PO) Take 1 tablet by mouth daily.    [provider]  Cholecalciferol (VITAMIN D) 2000 UNITS CAPS Take 2,000 Units by mouth daily.    [provider]  CINNAMON PO Take 1 capsule by mouth daily.    [provider]  docusate sodium (COLACE) 100 MG capsule Take one tablet by mouth once daily for 7 days 10/09/20   Amanda Paul  ibuprofen (ADVIL) 600 MG tablet Take one tablet by mouth once every 6 hours scheduled for 5 days, followed by once every 6 hours as needed for moderate-severe pain. 10/09/20   Amanda Paul  magnesium oxide (MAG-OX) 400 MG tablet Take 400 mg by mouth daily.    [provider]  meloxicam (MOBIC) 15 MG tablet TAKE 1 TABLET(15 MG) BY  MOUTH DAILY 07/16/20   Amanda Paul  Multiple Vitamin (MULTIVITAMIN) capsule Take 1 capsule by mouth daily.    [provider]  ondansetron (ZOFRAN) 8 MG tablet Take 1 tablet (8 mg total) by mouth every 8 (eight) hours as needed for nausea or vomiting. 10/09/20   Amanda Paul  rosuvastatin (CRESTOR) 10 MG tablet Take 1 tablet (10 mg total) by mouth daily. 09/15/20   Amanda Sine, MD     Family History  Problem Relation Age of Onset   Hypertension Mother     Social History   Socioeconomic History   Marital status: Single     Spouse name: Not on file   Number of children: Not on file   Years of education: Not on file   Highest education level: Not on file  Occupational History   Not on file  Tobacco Use   Smoking status: Never   Smokeless tobacco: Never  Vaping Use   Vaping Use: Never used  Substance and Sexual Activity   Alcohol use: Yes    Alcohol/week: 1.0 standard drink    Types: 1 Cans of beer per week    Comment: rare   Drug use: No   Sexual activity: Not Currently  Other Topics Concern   Not on file  Social History Narrative   Marital status: single      Children: none      Lives: alone      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   Respiratory: Negative.    Cardiovascular: Negative.   Gastrointestinal: Negative.   Genitourinary: Negative.   Musculoskeletal: Negative.   Neurological: Negative.    Review of Systems: A 12 point ROS discussed and pertinent positives are indicated in the HPI above.  All other systems are negative.  Physical Exam No direct physical exam was performed (except for noted visual exam findings with Video Visits).   Vital Signs: There were no vitals taken for this visit.  Imaging: IR Radiologist Eval & Mgmt  Result Date: 11/18/2020 Please refer to notes tab for details about interventional procedure. (Op Note)   Labs:  CBC: Recent Labs    06/16/20 1109 07/17/20 1127 10/08/20 0840  WBC 4.2 5.0 5.0  HGB 13.3 13.5 12.6  HCT 42.3 41.3 40.9  PLT 257 275 274    COAGS: Recent Labs    10/08/20 0840  INR 0.9    BMP: Recent Labs    06/16/20 1109 07/17/20 1127 10/08/20 0840  NA 137 141 138  K 4.5 4.4 3.7  CL 101 100 106  CO2 '21 24 26  '$ GLUCOSE 77 81 96   BUN '14 11 17  '$ CALCIUM 9.5 9.5 9.3  CREATININE 0.59 0.60 0.49  GFRNONAA  --   --  >60    LIVER FUNCTION TESTS: Recent Labs    07/17/20 1127  BILITOT 0.3  AST 18  ALT 16  ALKPHOS 56  PROT 7.6  ALBUMIN 4.5     Assessment and Plan:  Amanda Paul is doing well following fibroid embolization and has no signs of complication.  She has not had a menstrual cycle since the procedure.  I recommended a follow-up MRI approximately 9 months after the procedure in late March to early April 2023.  I will follow-up with her at that time to assess degree of fibroid necrosis and uterine volume decrease by MRI.   Electronically Signed: Azzie Roup 12/02/2020, 9:50 AM    I spent a total of  10 Minutes in remote  clinical consultation, greater than 50% of which was counseling/coordinating care post uterine fibroid embolization.    Visit type: Audio only (telephone). Audio (no video) only due to patient's lack of internet/smartphone capability. Alternative for in-person consultation at [facility], [address] [place], [place], [place]. This visit type was conducted due to national recommendations for restrictions regarding the COVID-19 Pandemic (e.g. social distancing).  This format is felt to be most appropriate for this patient at this time.  All issues noted in this document were discussed and addressed.

## 2020-12-09 ENCOUNTER — Other Ambulatory Visit: Payer: Self-pay | Admitting: Nurse Practitioner

## 2020-12-09 DIAGNOSIS — R7303 Prediabetes: Secondary | ICD-10-CM

## 2020-12-09 MED ORDER — METFORMIN HCL 500 MG PO TABS
500.0000 mg | ORAL_TABLET | Freq: Two times a day (BID) | ORAL | 2 refills | Status: DC
Start: 2020-12-09 — End: 2021-05-06

## 2021-01-05 ENCOUNTER — Other Ambulatory Visit: Payer: Self-pay | Admitting: Obstetrics & Gynecology

## 2021-01-05 ENCOUNTER — Other Ambulatory Visit: Payer: Self-pay | Admitting: Pediatrics

## 2021-01-05 DIAGNOSIS — Z1231 Encounter for screening mammogram for malignant neoplasm of breast: Secondary | ICD-10-CM

## 2021-02-04 ENCOUNTER — Ambulatory Visit: Payer: No Typology Code available for payment source

## 2021-03-09 ENCOUNTER — Ambulatory Visit
Admission: RE | Admit: 2021-03-09 | Discharge: 2021-03-09 | Disposition: A | Discharge status: Home / Self Care | Payer: No Typology Code available for payment source | Source: Ambulatory Visit | Attending: Obstetrics & Gynecology | Admitting: Obstetrics & Gynecology

## 2021-03-09 ENCOUNTER — Other Ambulatory Visit: Payer: Self-pay

## 2021-03-09 DIAGNOSIS — Z1231 Encounter for screening mammogram for malignant neoplasm of breast: Secondary | ICD-10-CM

## 2021-03-26 DIAGNOSIS — L920 Granuloma annulare: Secondary | ICD-10-CM | POA: Insufficient documentation

## 2021-05-06 ENCOUNTER — Ambulatory Visit (INDEPENDENT_AMBULATORY_CARE_PROVIDER_SITE_OTHER): Payer: No Typology Code available for payment source | Admitting: Internal Medicine

## 2021-05-06 ENCOUNTER — Other Ambulatory Visit: Payer: Self-pay

## 2021-05-06 ENCOUNTER — Encounter: Payer: Self-pay | Admitting: Internal Medicine

## 2021-05-06 VITALS — BP 122/80 | HR 76 | Temp 98.1°F | Ht 65.2 in | Wt 301.0 lb

## 2021-05-06 DIAGNOSIS — R7303 Prediabetes: Secondary | ICD-10-CM | POA: Diagnosis not present

## 2021-05-06 DIAGNOSIS — E7800 Pure hypercholesterolemia, unspecified: Secondary | ICD-10-CM

## 2021-05-06 DIAGNOSIS — Z6841 Body Mass Index (BMI) 40.0 and over, adult: Secondary | ICD-10-CM

## 2021-05-06 DIAGNOSIS — Z23 Encounter for immunization: Secondary | ICD-10-CM | POA: Diagnosis not present

## 2021-05-06 DIAGNOSIS — I10 Essential (primary) hypertension: Secondary | ICD-10-CM

## 2021-05-06 DIAGNOSIS — E559 Vitamin D deficiency, unspecified: Secondary | ICD-10-CM | POA: Diagnosis not present

## 2021-05-06 LAB — POCT URINALYSIS DIPSTICK
Bilirubin, UA: NEGATIVE
Blood, UA: NEGATIVE
Glucose, UA: NEGATIVE
Ketones, UA: NEGATIVE
Leukocytes, UA: NEGATIVE
Nitrite, UA: NEGATIVE
Protein, UA: NEGATIVE
Spec Grav, UA: 1.025 (ref 1.010–1.025)
Urobilinogen, UA: 0.2 E.U./dL
pH, UA: 6.5 (ref 5.0–8.0)

## 2021-05-06 NOTE — Progress Notes (Signed)
Rich Brave [place],acting as a Education administrator for Maximino Greenland, MD.,have documented all relevant documentation on the behalf of Maximino Greenland, MD,as directed by  Maximino Greenland, MD while in the presence of Maximino Greenland, MD.  This visit occurred during the SARS-CoV-2 public health emergency.  Safety protocols were in place, including screening questions prior to the visit, additional usage of staff PPE, and extensive cleaning of exam room while observing appropriate contact time as indicated for disinfecting solutions.  Subjective:     Patient ID: Amanda Paul , female    DOB: [birth year] , 46 y.o.   MRN: [id number]   Chief Complaint  Patient presents with   Diabetes   Hypertension   Hyperlipidemia    HPI  Patient is here for blood pressure and prediabetes f/u. She was previously established with [facility], but transferred care to a practice that was more convenient for her. She re-established with Dr. Zoe Lan., and is seeing me today for f/u.  She states she is not taking the metformin as prescribed, she prefers to control her prediabetes with lifestyle changes. She does report compliance with statin therapy as prescribed by her Cardiologist.     Hypertension This is a chronic problem. The current episode started more than 1 year ago. The problem has been gradually improving since onset. The problem is controlled. Pertinent negatives include no blurred vision or palpitations. Risk factors for coronary artery disease include dyslipidemia and obesity. Past treatments include calcium channel blockers.    Past Medical History:  Diagnosis Date   Allergy    Arthritis    Hernia, umbilical    Morbid obesity ([place])      Family History  Problem Relation Age of Onset   Hypertension Mother      Current Outpatient Medications:    acetaminophen (TYLENOL) 325 MG tablet, Take 650 mg by mouth every 6 (six) hours as needed for moderate pain., Disp: , Rfl:    amLODipine (NORVASC) 10 MG  tablet, Take 1 tablet (10 mg total) by mouth daily., Disp: 90 tablet, Rfl: 3   Calcium Carb-Cholecalciferol (CALCIUM 500 +D PO), Take 1 tablet by mouth daily., Disp: , Rfl:    Cholecalciferol (VITAMIN D) 2000 UNITS CAPS, Take 2,000 Units by mouth daily., Disp: , Rfl:    CINNAMON PO, Take 1 capsule by mouth daily., Disp: , Rfl:    magnesium oxide (MAG-OX) 400 MG tablet, Take 400 mg by mouth daily., Disp: , Rfl:    meloxicam (MOBIC) 15 MG tablet, TAKE 1 TABLET(15 MG) BY MOUTH DAILY, Disp: 30 tablet, Rfl: 3   Multiple Vitamin (MULTIVITAMIN) capsule, Take 1 capsule by mouth daily., Disp: , Rfl:    rosuvastatin (CRESTOR) 10 MG tablet, Take 1 tablet (10 mg total) by mouth daily., Disp: 90 tablet, Rfl: 3   Allergies  Allergen Reactions   Amoxicillin Hives, Itching and Swelling    Swollen eyes and throat.   Penicillins Hives and Swelling     Review of Systems  Constitutional: Negative.   Eyes:  Negative for blurred vision.  Respiratory: Negative.    Cardiovascular: Negative.  Negative for palpitations.  Gastrointestinal: Negative.   Neurological: Negative.   Psychiatric/Behavioral: Negative.      Today's Vitals   05/06/21 0934  BP: 122/80  Pulse: 76  Temp: 98.1 F (36.7 C)  Weight: (!) 301 lb (136.5 kg)  Height: 5' 5.2" (1.656 m)   Body mass index is 49.78 kg/m.  Wt Readings from Last 3 Encounters:  05/06/21 (!) 301 lb (136.5 kg)  10/16/20 (!) 303 lb 12.8 oz (137.8 kg)  10/08/20 (!) 311 lb (141.1 kg)     Objective:  Physical Exam Vitals and nursing note reviewed.  Constitutional:      Appearance: Normal appearance.  HENT:     Head: Normocephalic and atraumatic.     Nose:     Comments: Masked     Mouth/Throat:     Comments: Masked  Eyes:     Extraocular Movements: Extraocular movements intact.  Cardiovascular:     Rate and Rhythm: Normal rate and regular rhythm.     Heart sounds: Normal heart sounds.  Pulmonary:     Effort: Pulmonary effort is normal.     Breath  sounds: Normal breath sounds.  Musculoskeletal:     Cervical back: Normal range of motion.  Skin:    General: Skin is warm.  Neurological:     General: No focal deficit present.     Mental Status: She is alert.  Psychiatric:        Mood and Affect: Mood normal.        Behavior: Behavior normal.        Assessment And Plan:     1. Primary hypertension Comments: Chronic, well controlled. No change in meds. She will rto in May 2023 for CPE. She is encouraged to follow low sodium diet. - BMP8+EGFR - POCT Urinalysis Dipstick (81002) - Microalbumin / Creatinine Urine Ratio  2. Prediabetes Comments: I will check an a1c today. 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  - Hemoglobin A1c - BMP8+EGFR  3. Pure hypercholesterolemia Comments: She was advised to decrease rosuvastatin to M-F dosing only. Will recheck levels in May at yearly physical.  4. Vitamin D deficiency Comments: I will check a vitamin D level and supplement as needed.  - Vitamin D (25 hydroxy)  5. Class 3 severe obesity due to excess calories with serious comorbidity and body mass index (BMI) of 45.0 to 49.9 in adult [facility]) Comments: BMI 49.  She has lost 10 lbs since June 2022. She was also congratulated on her previous weight loss of 70+ pounds.   6. Immunization due Comments: She was given flu shot today.  - Flu Vaccine QUAD 6+ mos PF IM (Fluarix Quad PF)   Patient was given opportunity to ask questions. Patient verbalized understanding of the plan and was able to repeat key elements of the plan. All questions were answered to their satisfaction.   I, Maximino Greenland, MD, have reviewed all documentation for this visit. The documentation on 05/06/21 for the exam, diagnosis, procedures, and orders are all accurate and complete.   IF YOU HAVE BEEN REFERRED TO A SPECIALIST, IT MAY TAKE 1-2 WEEKS TO SCHEDULE/PROCESS THE REFERRAL. IF YOU HAVE NOT HEARD FROM US/SPECIALIST IN TWO  WEEKS, PLEASE GIVE [place] A CALL AT [phone].   THE PATIENT IS ENCOURAGED TO PRACTICE SOCIAL DISTANCING DUE TO THE COVID-19 PANDEMIC.

## 2021-05-06 NOTE — Patient Instructions (Addendum)
Semaglutide Injection (Weight Management) What is this medication? SEMAGLUTIDE (SEM a GLOO tide) promotes weight loss. It may also be used to maintain weight loss. It works by decreasing appetite. Changes to diet and exercise are often combined with this medication. This medicine may be used for other purposes; ask your health care provider or pharmacist if you have questions. COMMON BRAND NAME(S): [id number] What should I tell my care team before I take this medication? They need to know if you have any of these conditions: Endocrine tumors (MEN 2) or if someone in your family had these tumors Eye disease, vision problems Gallbladder disease History of depression or mental health disease History of pancreatitis Kidney disease Stomach or intestine problems Suicidal thoughts, plans, or attempt; a previous suicide attempt by you or a family member Thyroid cancer or if someone in your family had thyroid cancer An unusual or allergic reaction to semaglutide, other medications, foods, dyes, or preservatives Pregnant or trying to get pregnant Breast-feeding How should I use this medication? This medication is injected under the skin. You will be taught how to prepare and give it. Take it as directed on the prescription label. It is given once every week (every 7 days). Keep taking it unless your care team tells you to stop. It is important that you put your used needles and pens in a special sharps container. Do not put them in a trash can. If you do not have a sharps container, call your pharmacist or care team to get one. A special MedGuide will be given to you by the pharmacist with each prescription and refill. Be sure to read this information carefully each time. This medication comes with INSTRUCTIONS FOR USE. Ask your pharmacist for directions on how to use this medication. Read the information carefully. Talk to your pharmacist or care team if you have questions. Talk to your care team about  the use of this medication in children. Special care may be needed. Overdosage: If you think you have taken too much of this medicine contact a poison control center or emergency room at once. NOTE: This medicine is only for you. Do not share this medicine with others. What if I miss a dose? If you miss a dose and the next scheduled dose is more than 2 days away, take the missed dose as soon as possible. If you miss a dose and the next scheduled dose is less than 2 days away, do not take the missed dose. Take the next dose at your regular time. Do not take double or extra doses. If you miss your dose for 2 weeks or more, take the next dose at your regular time or call your care team to talk about how to restart this medication. What may interact with this medication? Insulin and other medications for diabetes This list may not describe all possible interactions. Give your health care provider a list of all the medicines, herbs, non-prescription drugs, or dietary supplements you use. Also tell them if you smoke, drink alcohol, or use illegal drugs. Some items may interact with your medicine. What should I watch for while using this medication? Visit your care team for regular checks on your progress. It may be some time before you see the benefit from this medication. Drink plenty of fluids while taking this medication. Check with your care team if you have severe diarrhea, nausea, and vomiting, or if you sweat a lot. The loss of too much body fluid may make it dangerous for  you to take this medication. This medication may affect blood sugar levels. Ask your care team if changes in diet or medications are needed if you have diabetes. If you or your family notice any changes in your behavior, such as new or worsening depression, thoughts of harming yourself, anxiety, other unusual or disturbing thoughts, or memory loss, call your care team right away. Women should inform their care team if they wish to  become pregnant or think they might be pregnant. Losing weight while pregnant is not advised and may cause harm to the unborn child. Talk to your care team for more information. What side effects may I notice from receiving this medication? Side effects that you should report to your care team as soon as possible: Allergic reactions--skin rash, itching, hives, swelling of the face, lips, tongue, or throat Change in vision Dehydration--increased thirst, dry mouth, feeling faint or lightheaded, headache, dark yellow or brown urine Gallbladder problems--severe stomach pain, nausea, vomiting, fever Heart palpitations--rapid, pounding, or irregular heartbeat Kidney injury--decrease in the amount of urine, swelling of the ankles, hands, or feet Pancreatitis--severe stomach pain that spreads to your back or gets worse after eating or when touched, fever, nausea, vomiting Thoughts of suicide or self-harm, worsening mood, feelings of depression Thyroid cancer--new mass or lump in the neck, pain or trouble swallowing, trouble breathing, hoarseness Side effects that usually do not require medical attention (report to your care team if they continue or are bothersome): Diarrhea Loss of appetite Nausea Stomach pain Vomiting This list may not describe all possible side effects. Call your doctor for medical advice about side effects. You may report side effects to FDA at 1-800-FDA-1088. Where should I keep my medication? Keep out of the reach of children and pets. Refrigeration (preferred): Store in the refrigerator. Do not freeze. Keep this medication in the original container until you are ready to take it. Get rid of any unused medication after the expiration date. Room temperature: If needed, prior to cap removal, the pen can be stored at room temperature for up to 28 days. Protect from light. If it is stored at room temperature, get rid of any unused medication after 28 days or after it expires,  whichever is first. It is important to get rid of the medication as soon as you no longer need it or it is expired. You can do this in two ways: Take the medication to a medication take-back program. Check with your pharmacy or law enforcement to find a location. If you cannot return the medication, follow the directions in the [place]. NOTE: This sheet is a summary. It may not cover all possible information. If you have questions about this medicine, talk to your doctor, pharmacist, or health care provider.  2022 Elsevier/Gold Standard (2020-07-04 00:00:00)   Hypertension, Adult Hypertension is another name for high blood pressure. High blood pressure forces your heart to work harder to pump blood. This can cause problems over time. There are two numbers in a blood pressure reading. There is a top number (systolic) over a bottom number (diastolic). It is best to have a blood pressure that is below 120/80. Healthy choices can help lower your blood pressure, or you may need medicine to help lower it. What are the causes? The cause of this condition is not known. Some conditions may be related to high blood pressure. What increases the risk? Smoking. Having type 2 diabetes mellitus, high cholesterol, or both. Not getting enough exercise or physical activity. Being overweight. Having  too much fat, sugar, calories, or salt (sodium) in your diet. Drinking too much alcohol. Having long-term (chronic) kidney disease. Having a family history of high blood pressure. Age. Risk increases with age. Race. You may be at higher risk if you are African American. Gender. Men are at higher risk than women before age 13. After age 56, women are at higher risk than men. Having obstructive sleep apnea. Stress. What are the signs or symptoms? High blood pressure may not cause symptoms. Very high blood pressure (hypertensive crisis) may cause: Headache. Feelings of worry or nervousness (anxiety). Shortness  of breath. Nosebleed. A feeling of being sick to your stomach (nausea). Throwing up (vomiting). Changes in how you see. Very bad chest pain. Seizures. How is this treated? This condition is treated by making healthy lifestyle changes, such as: Eating healthy foods. Exercising more. Drinking less alcohol. Your health care provider may prescribe medicine if lifestyle changes are not enough to get your blood pressure under control, and if: Your top number is above 130. Your bottom number is above 80. Your personal target blood pressure may vary. Follow these instructions at home: Eating and drinking  If told, follow the DASH eating plan. To follow this plan: Fill one half of your plate at each meal with fruits and vegetables. Fill one fourth of your plate at each meal with whole grains. Whole grains include whole-wheat pasta, brown rice, and whole-grain bread. Eat or drink low-fat dairy products, such as skim milk or low-fat yogurt. Fill one fourth of your plate at each meal with low-fat (lean) proteins. Low-fat proteins include fish, chicken without skin, eggs, beans, and tofu. Avoid fatty meat, cured and processed meat, or chicken with skin. Avoid pre-made or processed food. Eat less than 1,500 mg of salt each day. Do not drink alcohol if: Your doctor tells you not to drink. You are pregnant, may be pregnant, or are planning to become pregnant. If you drink alcohol: Limit how much you use to: 0-1 drink a day for women. 0-2 drinks a day for men. Be aware of how much alcohol is in your drink. In the [place] one drink equals one 12 oz bottle of beer (355 mL), one 5 oz glass of wine (148 mL), or one 1 oz glass of hard liquor (44 mL). Lifestyle  Work with your doctor to stay at a healthy weight or to lose weight. Ask your doctor what the best weight is for you. Get at least 30 minutes of exercise most days of the week. This may include walking, swimming, or biking. Get at least 30  minutes of exercise that strengthens your muscles (resistance exercise) at least 3 days a week. This may include lifting weights or doing Pilates. Do not use any products that contain nicotine or tobacco, such as cigarettes, e-cigarettes, and chewing tobacco. If you need help quitting, ask your doctor. Check your blood pressure at home as told by your doctor. Keep all follow-up visits as told by your doctor. This is important. Medicines Take over-the-counter and prescription medicines only as told by your doctor. Follow directions carefully. Do not skip doses of blood pressure medicine. The medicine does not work as well if you skip doses. Skipping doses also puts you at risk for problems. Ask your doctor about side effects or reactions to medicines that you should watch for. Contact a doctor if you: Think you are having a reaction to the medicine you are taking. Have headaches that keep coming back (recurring). Feel dizzy.  Have swelling in your ankles. Have trouble with your vision. Get help right away if you: Get a very bad headache. Start to feel mixed up (confused). Feel weak or numb. Feel faint. Have very bad pain in your: Chest. Belly (abdomen). Throw up more than once. Have trouble breathing. Summary Hypertension is another name for high blood pressure. High blood pressure forces your heart to work harder to pump blood. For most people, a normal blood pressure is less than 120/80. Making healthy choices can help lower blood pressure. If your blood pressure does not get lower with healthy choices, you may need to take medicine. This information is not intended to replace advice given to you by your health care provider. Make sure you discuss any questions you have with your health care provider. Document Revised: 12/07/2017 Document Reviewed: 12/07/2017 Elsevier Patient Education  [place].

## 2021-05-07 ENCOUNTER — Encounter: Payer: Self-pay | Admitting: Internal Medicine

## 2021-05-07 LAB — BMP8+EGFR
BUN/Creatinine Ratio: 26 — ABNORMAL HIGH (ref 9–23)
BUN: 16 mg/dL (ref 6–24)
CO2: 21 mmol/L (ref 20–29)
Calcium: 9.2 mg/dL (ref 8.7–10.2)
Chloride: 105 mmol/L (ref 96–106)
Creatinine, Ser: 0.62 mg/dL (ref 0.57–1.00)
Glucose: 75 mg/dL (ref 70–99)
Potassium: 4.2 mmol/L (ref 3.5–5.2)
Sodium: 143 mmol/L (ref 134–144)
eGFR: 110 mL/min/{1.73_m2} (ref >59)

## 2021-05-07 LAB — HEMOGLOBIN A1C
Est. average glucose Bld gHb Est-mCnc: 128 mg/dL
Hgb A1c MFr Bld: 6.1 % — ABNORMAL HIGH (ref 4.8–5.6)

## 2021-05-07 LAB — VITAMIN D 25 HYDROXY (VIT D DEFICIENCY, FRACTURES): Vit D, 25-Hydroxy: 17.9 ng/mL — ABNORMAL LOW (ref 30.0–100.0)

## 2021-05-08 ENCOUNTER — Other Ambulatory Visit: Payer: Self-pay

## 2021-05-08 MED ORDER — VITAMIN D (ERGOCALCIFEROL) 1.25 MG (50000 UNIT) PO CAPS: 50000.0000 [IU] | ORAL_CAPSULE | ORAL | 1 refills | Status: DC

## 2021-05-11 ENCOUNTER — Encounter: Payer: Self-pay | Admitting: Internal Medicine

## 2021-05-12 ENCOUNTER — Ambulatory Visit: Payer: No Typology Code available for payment source

## 2021-05-12 ENCOUNTER — Other Ambulatory Visit: Payer: Self-pay | Admitting: Internal Medicine

## 2021-05-12 ENCOUNTER — Other Ambulatory Visit: Payer: Self-pay

## 2021-05-12 VITALS — BP 130/72 | HR 90 | Temp 98.3°F | Ht 65.2 in | Wt 309.0 lb

## 2021-05-12 DIAGNOSIS — Z6841 Body Mass Index (BMI) 40.0 and over, adult: Secondary | ICD-10-CM

## 2021-05-12 MED ORDER — WEGOVY 0.5 MG/0.5ML ~~LOC~~ SOAJ: 0.5000 mg | SUBCUTANEOUS | 0 refills | Status: DC

## 2021-05-12 NOTE — Progress Notes (Signed)
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 She does not want to start medication today since she is going out to eat tonight she will start tomorrow. Medication sent to pharmacy for approval.

## 2021-05-28 ENCOUNTER — Other Ambulatory Visit: Payer: Self-pay | Admitting: Internal Medicine

## 2021-06-01 ENCOUNTER — Telehealth: Payer: Self-pay

## 2021-06-01 NOTE — Telephone Encounter (Signed)
PA completed for wegovy 

## 2021-06-24 ENCOUNTER — Ambulatory Visit (INDEPENDENT_AMBULATORY_CARE_PROVIDER_SITE_OTHER): Payer: No Typology Code available for payment source | Admitting: Obstetrics & Gynecology

## 2021-06-24 ENCOUNTER — Other Ambulatory Visit: Payer: Self-pay

## 2021-06-24 ENCOUNTER — Encounter: Payer: Self-pay | Admitting: Obstetrics & Gynecology

## 2021-06-24 ENCOUNTER — Other Ambulatory Visit (HOSPITAL_COMMUNITY)
Admission: RE | Admit: 2021-06-24 | Discharge: 2021-06-24 | Disposition: A | Discharge status: Home / Self Care | Payer: No Typology Code available for payment source | Source: Ambulatory Visit | Attending: Obstetrics & Gynecology | Admitting: Obstetrics & Gynecology

## 2021-06-24 VITALS — BP 149/91 | HR 67 | Wt 296.0 lb

## 2021-06-24 DIAGNOSIS — Z01419 Encounter for gynecological examination (general) (routine) without abnormal findings: Secondary | ICD-10-CM

## 2021-06-24 DIAGNOSIS — D219 Benign neoplasm of connective and other soft tissue, unspecified: Secondary | ICD-10-CM | POA: Diagnosis not present

## 2021-06-24 NOTE — Addendum Note (Signed)
Addended by: Phill Myron on: 06/24/2021 03:07 PM ? ? Modules accepted: Orders ? ?

## 2021-06-24 NOTE — Progress Notes (Signed)
Last pap 06-16-20 ?Last mammogram 03-09-21. Kathrene Alu RN  ?

## 2021-06-24 NOTE — Progress Notes (Signed)
Subjective:  ?  ? Amanda Paul is a 46 y.o. female here for a routine exam.  Current complaints: Pt reports that since her [place] she has min spotting at the time of her cycle. She bleeding is otherwise markedly well controlled. She reports that she is now on Weight Watchers and she likes the program and has been losing weight.   No new medical problems or GYN issues.    ? ?Gynecologic History ?Patient's last menstrual period was 05/27/2021. ?Contraception: abstinence ?Last Pap: 06/16/2020. Results were: normal ?Last mammogram: 03/09/2021. Results were: normal ? ?Obstetric History ?OB History  ?Gravida Para Term Preterm AB Living  ?0 0 0 0 0 0  ?SAB IAB Ectopic Multiple Live Births  ?0 0 0 0    ? ?The following portions of the patient's history were reviewed and updated as appropriate: allergies, current medications, past family history, past medical history, past social history, past surgical history, and problem list. ? ?Review of Systems ?Pertinent items are noted in HPI.  ?  ?Objective:  ?BP (!) 149/91   Pulse 67   Wt 296 lb (134.3 kg)   LMP 05/27/2021   BMI 48.96 kg/m?  ? ?General Appearance:    Alert, cooperative, no distress, appears stated age  ?Head:    Normocephalic, without obvious abnormality, atraumatic  ?Eyes:    conjunctiva/corneas clear, EOM's intact, both eyes  ?Ears:    Normal external ear canals, both ears  ?Nose:   Nares normal, septum midline, mucosa normal, no drainage    or sinus tenderness  ?Throat:   Lips, mucosa, and tongue normal; teeth and gums normal  ?Neck:   Supple, symmetrical, trachea midline, no adenopathy;  ?  thyroid:  no enlargement/tenderness/nodules  ?Back:     Symmetric, no curvature, ROM normal, no CVA tenderness  ?Lungs:     respirations unlabored  ?Chest Wall:    No tenderness or deformity  ? Heart:    Regular rate and rhythm  ?Breast Exam:    No tenderness, masses, or nipple abnormality  ?Abdomen:     Soft, non-tender, bowel sounds active all four quadrants,  ?   no masses, no organomegaly  ?Genitalia:    Normal female without lesion, discharge or tenderness  ? Large uterine fibroids ~25 weeks sized. [place].    ?Extremities:   Extremities normal, atraumatic, no cyanosis or edema  ?Pulses:   2+ and symmetric all extremities  ?Skin:   Skin color, texture, turgor normal, no rashes or lesions  ?  ? ?Assessment:  ? ? Healthy female exam.  ?Fibroids s/p [place]. Doing much better.  ?  ?Plan:  ? ?Diagnoses and all orders for this visit: ? ?Well female exam with routine gynecological exam ? ? F/u PAP in 1 year ?F/u in 1 year or sooner prn  ? ?Makhai Fulco L. Harraway-Smith, M.D., [place] ? ?

## 2021-06-29 LAB — CYTOLOGY - PAP
Adequacy: ABSENT
Comment: NEGATIVE
Diagnosis: NEGATIVE
High risk HPV: NEGATIVE

## 2021-07-22 ENCOUNTER — Encounter: Payer: No Typology Code available for payment source | Admitting: Nurse Practitioner

## 2021-07-28 ENCOUNTER — Other Ambulatory Visit: Payer: Self-pay | Admitting: Podiatry

## 2021-07-28 ENCOUNTER — Ambulatory Visit (INDEPENDENT_AMBULATORY_CARE_PROVIDER_SITE_OTHER): Payer: No Typology Code available for payment source | Admitting: Podiatry

## 2021-07-28 ENCOUNTER — Ambulatory Visit (INDEPENDENT_AMBULATORY_CARE_PROVIDER_SITE_OTHER): Payer: No Typology Code available for payment source

## 2021-07-28 ENCOUNTER — Encounter: Payer: Self-pay | Admitting: Podiatry

## 2021-07-28 DIAGNOSIS — M778 Other enthesopathies, not elsewhere classified: Secondary | ICD-10-CM

## 2021-07-28 DIAGNOSIS — S86112D Strain of other muscle(s) and tendon(s) of posterior muscle group at lower leg level, left leg, subsequent encounter: Secondary | ICD-10-CM

## 2021-07-28 DIAGNOSIS — M7672 Peroneal tendinitis, left leg: Secondary | ICD-10-CM

## 2021-07-28 DIAGNOSIS — Q666 Other congenital valgus deformities of feet: Secondary | ICD-10-CM

## 2021-07-28 MED ORDER — IBUPROFEN 600 MG PO TABS: 600.0000 mg | ORAL_TABLET | Freq: Three times a day (TID) | ORAL | 1 refills | Status: DC | PRN

## 2021-07-28 NOTE — Progress Notes (Signed)
She presents today after having not seen her for about 2 years.  She states that she has been unable to get in here for evaluation but she has been staying on her anti-inflammatories daily wearing appropriate shoe gear and getting new shoes as her other ones are wearing out try to stay off of her feet when she is not working.  She states that her left foot is is absolutely killing her. ? ?Objective: Vital signs are stable alert and oriented x3.  Pulses are palpable.  Left foot is swollen and painful on palpation of the posterior tibial tendon at the navicular tuberosity and overlying the floor of the sinus tarsi.  She has severe pain on inversion against resistance of the posterior tibial tendon and marked tenderness overlying the dorsal lateral aspect of the foot at the floor of the sinus tarsi or calcaneocuboid joint area. ? ?Radiographs taken today demonstrate spurring and osseous hypertrophy of the navicular tuberosity as well as a large dorsal spur and osteoarthritic changes overlying the floor of the sinus tarsi.  No fractures identified. ? ?Assessment: Cannot rule out split tear of the posterior tibial tendon.  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  Also pes planovalgus. ? ?Plan: Discussed etiology pathology conservative surgical therapies at this point requesting MRI for diagnostic evaluation of the posterior tibial tendon lateral aspect of the foot.  Considering differential diagnosis and surgical consideration.  I also refilled her ibuprofen 600 mg. ?

## 2021-08-03 ENCOUNTER — Other Ambulatory Visit: Payer: Self-pay | Admitting: Obstetrics & Gynecology

## 2021-08-03 ENCOUNTER — Other Ambulatory Visit: Payer: Self-pay | Admitting: Interventional Radiology

## 2021-08-03 DIAGNOSIS — D250 Submucous leiomyoma of uterus: Secondary | ICD-10-CM

## 2021-08-05 ENCOUNTER — Encounter: Payer: Self-pay | Admitting: Internal Medicine

## 2021-08-05 ENCOUNTER — Other Ambulatory Visit: Payer: Self-pay

## 2021-08-05 MED ORDER — VITAMIN D (ERGOCALCIFEROL) 1.25 MG (50000 UNIT) PO CAPS: 50000.0000 [IU] | ORAL_CAPSULE | ORAL | 1 refills | Status: DC

## 2021-08-06 ENCOUNTER — Other Ambulatory Visit: Payer: Self-pay | Admitting: Interventional Radiology

## 2021-08-06 DIAGNOSIS — D259 Leiomyoma of uterus, unspecified: Secondary | ICD-10-CM

## 2021-08-07 ENCOUNTER — Other Ambulatory Visit: Payer: Self-pay | Admitting: Obstetrics & Gynecology

## 2021-08-07 DIAGNOSIS — D259 Leiomyoma of uterus, unspecified: Secondary | ICD-10-CM

## 2021-08-12 ENCOUNTER — Ambulatory Visit
Admission: RE | Admit: 2021-08-12 | Discharge: 2021-08-12 | Disposition: A | Discharge status: Home / Self Care | Payer: No Typology Code available for payment source | Source: Ambulatory Visit | Attending: Podiatry | Admitting: Podiatry

## 2021-08-12 DIAGNOSIS — S86112D Strain of other muscle(s) and tendon(s) of posterior muscle group at lower leg level, left leg, subsequent encounter: Secondary | ICD-10-CM

## 2021-08-31 ENCOUNTER — Ambulatory Visit
Admission: RE | Admit: 2021-08-31 | Discharge: 2021-08-31 | Disposition: A | Discharge status: Home / Self Care | Payer: No Typology Code available for payment source | Source: Ambulatory Visit | Attending: Obstetrics & Gynecology | Admitting: Obstetrics & Gynecology

## 2021-08-31 DIAGNOSIS — D259 Leiomyoma of uterus, unspecified: Secondary | ICD-10-CM

## 2021-08-31 MED ORDER — GADOBENATE DIMEGLUMINE 529 MG/ML IV SOLN
15.0000 mL | Freq: Once | INTRAVENOUS | Status: AC | PRN
  Administered 2021-08-31: 15 mL via INTRAVENOUS

## 2021-09-02 ENCOUNTER — Encounter: Payer: Self-pay | Admitting: Obstetrics & Gynecology

## 2021-09-03 ENCOUNTER — Ambulatory Visit (INDEPENDENT_AMBULATORY_CARE_PROVIDER_SITE_OTHER): Payer: No Typology Code available for payment source | Admitting: Podiatry

## 2021-09-03 ENCOUNTER — Encounter: Payer: Self-pay | Admitting: Podiatry

## 2021-09-03 DIAGNOSIS — S86112D Strain of other muscle(s) and tendon(s) of posterior muscle group at lower leg level, left leg, subsequent encounter: Secondary | ICD-10-CM

## 2021-09-06 NOTE — Progress Notes (Signed)
She presents today for follow-up of her pain to the medial aspect of her left ankle.  States that is just exquisitely painful and it seems to be getting worse.  She still would like to go ahead and get this fixed if I can.  Objective: Vital signs are stable alert oriented x3 reviewed past medical history medication allergies surgeries and social history.  Currently she still has severe pain on palpation of the posterior tibial tendon as it courses beneath the medial malleolus extending to the navicular tuberosity.  The majority of her pain is located overlying the navicular tuberosity and just proximal to that.  MRI findings are consistent with mild posterior tibial tenosynovitis and distal insertional tendinitis there is a thin linear partial-thickness tear involving the medial distal aspect of the 50% of the insertion at the navicular tuberosity and navicular tuberosity is large suggesting a type III os naviculare as well.  MRI also demonstrates partial-thickness longitudinal split tear of the peroneus brevis tendon remote partial-thickness tear of the tibia fibular ligament.  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  None of this is symptomatic.  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  Plan: Discussed etiology pathology and surgical therapies at this point we decided for a Kidner procedure with posterior tibial tendon repair left foot.  She understands a cast will be placed as well.  This will be a nonambulatory cast and that it could be as long as 6 to 8 weeks and she understands this.  She understands that she is going to need a knee scooter and crutches as well.  We did discuss the possible side effects complications associated with this which may include but are not limited to postop pain bleeding swelling infection recurrence need for further surgery overcorrection under correction also digit loss of limb loss of  life.  We discussed the surgery center and the anesthesia group with her she would like to have this done as soon as possible we will follow-up with her in the near future for surgical intervention.

## 2021-09-09 ENCOUNTER — Encounter: Payer: Self-pay | Admitting: Internal Medicine

## 2021-09-09 ENCOUNTER — Ambulatory Visit (INDEPENDENT_AMBULATORY_CARE_PROVIDER_SITE_OTHER): Payer: No Typology Code available for payment source | Admitting: Internal Medicine

## 2021-09-09 VITALS — BP 130/74 | HR 73 | Temp 97.7°F | Ht 65.2 in | Wt 298.0 lb

## 2021-09-09 DIAGNOSIS — Z6841 Body Mass Index (BMI) 40.0 and over, adult: Secondary | ICD-10-CM

## 2021-09-09 DIAGNOSIS — Z0000 Encounter for general adult medical examination without abnormal findings: Secondary | ICD-10-CM | POA: Diagnosis not present

## 2021-09-09 DIAGNOSIS — I10 Essential (primary) hypertension: Secondary | ICD-10-CM

## 2021-09-09 MED ORDER — AMLODIPINE BESYLATE 10 MG PO TABS
10.0000 mg | ORAL_TABLET | Freq: Every day | ORAL | 3 refills | Status: DC
Start: 2021-09-09 — End: 2022-09-09

## 2021-09-09 MED ORDER — ROSUVASTATIN CALCIUM 10 MG PO TABS
10.0000 mg | ORAL_TABLET | Freq: Every day | ORAL | 3 refills | Status: DC
Start: 2021-09-09 — End: 2022-12-21

## 2021-09-09 NOTE — Progress Notes (Incomplete)
Rich Brave Llittleton,acting as a Education administrator for Maximino Greenland, MD.,have documented all relevant documentation on the behalf of Maximino Greenland, MD,as directed by  Maximino Greenland, MD while in the presence of Maximino Greenland, MD.  This visit occurred during the SARS-CoV-2 public health emergency.  Safety protocols were in place, including screening questions prior to the visit, additional usage of staff PPE, and extensive cleaning of exam room while observing appropriate contact time as indicated for disinfecting solutions.  Subjective:     Patient ID: Amanda Paul , female    DOB: 01/08/1976 , 46 y.o.   MRN: [id number]   Chief Complaint  Patient presents with   Annual Exam    HPI  Patient is here for annual physical. She has no concerns at this time. She does see Dr Ihor Dow for her GYN care.     Past Medical History:  Diagnosis Date   Allergy    Arthritis    Hernia, umbilical    Morbid obesity ([place])      Family History  Problem Relation Age of Onset   Hypertension Mother      Current Outpatient Medications:    acetaminophen (TYLENOL) 325 MG tablet, Take 650 mg by mouth every 6 (six) hours as needed for moderate pain., Disp: , Rfl:    amLODipine (NORVASC) 10 MG tablet, Take 1 tablet (10 mg total) by mouth daily., Disp: 90 tablet, Rfl: 3   CINNAMON PO, Take 1 capsule by mouth daily., Disp: , Rfl:    ibuprofen (ADVIL) 600 MG tablet, Take 1 tablet (600 mg total) by mouth every 8 (eight) hours as needed., Disp: 90 tablet, Rfl: 1   magnesium oxide (MAG-OX) 400 MG tablet, Take 400 mg by mouth daily., Disp: , Rfl:    Multiple Vitamin (MULTIVITAMIN) capsule, Take 1 capsule by mouth daily., Disp: , Rfl:    rosuvastatin (CRESTOR) 10 MG tablet, Take 1 tablet (10 mg total) by mouth daily., Disp: 90 tablet, Rfl: 3   Vitamin D, Ergocalciferol, (DRISDOL) 1.25 MG (50000 UNIT) CAPS capsule, Take 1 capsule (50,000 Units total) by mouth every 7 (seven) days., Disp: 12 capsule,  Rfl: 1   Calcium Carb-Cholecalciferol (CALCIUM 500 +D PO), Take 1 tablet by mouth daily. (Patient not taking: Reported on 09/09/2021), Disp: , Rfl:    Allergies  Allergen Reactions   Amoxicillin Hives, Itching and Swelling    Swollen eyes and throat.   Penicillins Hives and Swelling      The patient states she uses {contraceptive methods:5051} for birth control. Last LMP was No LMP recorded.. {Dysmenorrhea-menorrhagia:21918}. Negative for: breast discharge, breast lump(s), breast pain and breast self exam. Associated symptoms include abnormal vaginal bleeding. Pertinent negatives include abnormal bleeding (hematology), anxiety, decreased libido, depression, difficulty falling sleep, dyspareunia, history of infertility, nocturia, sexual dysfunction, sleep disturbances, urinary incontinence, urinary urgency, vaginal discharge and vaginal itching. Diet regular.The patient states her exercise level is    . The patient's tobacco use is:  Social History   Tobacco Use  Smoking Status Never  Smokeless Tobacco Never  . She has been exposed to passive smoke. The patient's alcohol use is:  Social History   Substance and Sexual Activity  Alcohol Use Yes   Alcohol/week: 1.0 standard drink   Types: 1 Cans of beer per week   Comment: rare  . Additional information: Last pap ***, next one scheduled for ***.    Review of Systems   Today's Vitals   09/09/21 1408  BP: 130/74  Pulse: 73  Weight: 298 lb (135.2 kg)  Height: 5' 5.2" (1.656 m)   Body mass index is 49.29 kg/m.   Objective:  Physical Exam      Assessment And Plan:     1. Encounter for annual physical exam  2. Primary hypertension - EKG 12-Lead - POCT Urinalysis Dipstick (81002) - Microalbumin / Creatinine Urine Ratio  3. Prediabetes  4. Pure hypercholesterolemia  5. Vitamin D deficiency  6. Class 3 severe obesity due to excess calories with serious comorbidity and body mass index (BMI) of 45.0 to 49.9 in adult  [facility])     Patient was given opportunity to ask questions. Patient verbalized understanding of the plan and was able to repeat key elements of the plan. All questions were answered to their satisfaction.   Sheppard Evens [place], CMA   I, [place], CMA, have reviewed all documentation for this visit. The documentation on 09/09/21 for the exam, diagnosis, procedures, and orders are all accurate and complete.  THE PATIENT IS ENCOURAGED TO PRACTICE SOCIAL DISTANCING DUE TO THE COVID-19 PANDEMIC.

## 2021-09-09 NOTE — Progress Notes (Unsigned)
I,Tianna Badgett,acting as a Education administrator for Maximino Greenland, MD.,have documented all relevant documentation on the behalf of Maximino Greenland, MD,as directed by  Maximino Greenland, MD while in the presence of Maximino Greenland, MD.  This visit occurred during the SARS-CoV-2 public health emergency.  Safety protocols were in place, including screening questions prior to the visit, additional usage of staff PPE, and extensive cleaning of exam room while observing appropriate contact time as indicated for disinfecting solutions.  Subjective:     Patient ID: Amanda Paul , female    DOB: 10-19-1975 , 46 y.o.   MRN: [id number]   Chief Complaint  Patient presents with   Annual Exam   Hypertension    HPI  Patient is here for annual physical. She reports compliance with meds.  She denies headaches, chest pain and shortness of breath.  She has no concerns at this time. She does see Dr Ihor Dow for her GYN care.   Hypertension This is a chronic problem. The current episode started more than 1 year ago. The problem has been gradually improving since onset. The problem is controlled. Pertinent negatives include no blurred vision or palpitations. Risk factors for coronary artery disease include dyslipidemia and obesity. Past treatments include calcium channel blockers.    Past Medical History:  Diagnosis Date   Allergy    Arthritis    Hernia, umbilical    Morbid obesity ([place])      Family History  Problem Relation Age of Onset   Hypertension Mother      Current Outpatient Medications:    acetaminophen (TYLENOL) 325 MG tablet, Take 650 mg by mouth every 6 (six) hours as needed for moderate pain., Disp: , Rfl:    CINNAMON PO, Take 1 capsule by mouth daily., Disp: , Rfl:    ibuprofen (ADVIL) 600 MG tablet, Take 1 tablet (600 mg total) by mouth every 8 (eight) hours as needed., Disp: 90 tablet, Rfl: 1   magnesium oxide (MAG-OX) 400 MG tablet, Take 400 mg by mouth daily., Disp: , Rfl:     Multiple Vitamin (MULTIVITAMIN) capsule, Take 1 capsule by mouth daily., Disp: , Rfl:    Vitamin D, Ergocalciferol, (DRISDOL) 1.25 MG (50000 UNIT) CAPS capsule, Take 1 capsule (50,000 Units total) by mouth every 7 (seven) days., Disp: 12 capsule, Rfl: 1   amLODipine (NORVASC) 10 MG tablet, Take 1 tablet (10 mg total) by mouth daily., Disp: 90 tablet, Rfl: 3   Calcium Carb-Cholecalciferol (CALCIUM 500 +D PO), Take 1 tablet by mouth daily. (Patient not taking: Reported on 09/09/2021), Disp: , Rfl:    rosuvastatin (CRESTOR) 10 MG tablet, Take 1 tablet (10 mg total) by mouth daily., Disp: 90 tablet, Rfl: 3   Allergies  Allergen Reactions   Amoxicillin Hives, Itching and Swelling    Swollen eyes and throat.   Penicillins Hives and Swelling      Last LMP was Patient's last menstrual period was 09/04/2021.. Negative for Dysmenorrhea. Negative for: breast discharge, breast lump(s), breast pain and breast self exam. Associated symptoms include abnormal vaginal bleeding. Pertinent negatives include abnormal bleeding (hematology), anxiety, decreased libido, depression, difficulty falling sleep, dyspareunia, history of infertility, nocturia, sexual dysfunction, sleep disturbances, urinary incontinence, urinary urgency, vaginal discharge and vaginal itching. Diet regular.    . The patient's tobacco use is:  Social History   Tobacco Use  Smoking Status Never  Smokeless Tobacco Never  . She has been exposed to passive smoke. The patient's alcohol use is:  Social  History   Substance and Sexual Activity  Alcohol Use Yes   Alcohol/week: 1.0 standard drink   Types: 1 Cans of beer per week   Comment: rare   Review of Systems  Constitutional: Negative.   HENT: Negative.    Eyes: Negative.  Negative for blurred vision.  Respiratory: Negative.    Cardiovascular: Negative.  Negative for palpitations.  Gastrointestinal: Negative.   Endocrine: Negative.   Genitourinary: Negative.   Musculoskeletal:  Negative.   Skin: Negative.   Allergic/Immunologic: Negative.   Neurological: Negative.   Hematological: Negative.   Psychiatric/Behavioral: Negative.      Today's Vitals   09/09/21 1408  BP: 130/74  Pulse: 73  Temp: 97.7 F (36.5 C)  Weight: 298 lb (135.2 kg)  Height: 5' 5.2" (1.656 m)  PainSc: 0-No pain   Body mass index is 49.29 kg/m.  Wt Readings from Last 3 Encounters:  09/09/21 298 lb (135.2 kg)  06/24/21 296 lb (134.3 kg)  05/12/21 (!) 309 lb (140.2 kg)    Objective:  Physical Exam Vitals and nursing note reviewed.  Constitutional:      Appearance: Normal appearance. She is obese.  HENT:     Head: Normocephalic and atraumatic.     Right Ear: Tympanic membrane, ear canal and external ear normal.     Left Ear: Tympanic membrane, ear canal and external ear normal.     Nose: Nose normal.     Mouth/Throat:     Mouth: Mucous membranes are moist.     Pharynx: Oropharynx is clear.  Eyes:     Extraocular Movements: Extraocular movements intact.     Conjunctiva/sclera: Conjunctivae normal.     Pupils: Pupils are equal, round, and reactive to light.  Cardiovascular:     Rate and Rhythm: Normal rate and regular rhythm.     Pulses: Normal pulses.     Heart sounds: Normal heart sounds.  Pulmonary:     Effort: Pulmonary effort is normal.     Breath sounds: Normal breath sounds.  Chest:  Breasts:    Tanner Score is 5.     Right: Normal.     Left: Normal.  Abdominal:     General: Bowel sounds are normal.     Palpations: Abdomen is soft.     Comments: Obese, soft, difficult to assess organomegaly  Genitourinary:    Comments: deferred Musculoskeletal:        General: Normal range of motion.     Cervical back: Normal range of motion and neck supple.  Skin:    General: Skin is warm and dry.  Neurological:     General: No focal deficit present.     Mental Status: She is alert and oriented to person, place, and time.  Psychiatric:        Mood and Affect: Mood  normal.        Behavior: Behavior normal.        Assessment And Plan:     1. Encounter for annual physical exam Comments: A full exam was performed. Importance of monthly self breast exams was discussed with the patient. PATIENT IS ADVISED TO GET 30-45 MINUTES REGULAR EXERCISE NO LESS THAN FOUR TO FIVE DAYS PER WEEK - BOTH WEIGHTBEARING EXERCISES AND AEROBIC ARE RECOMMENDED.  PATIENT IS ADVISED TO FOLLOW A HEALTHY DIET WITH AT LEAST SIX FRUITS/VEGGIES PER DAY, DECREASE INTAKE OF RED MEAT, AND TO INCREASE FISH INTAKE TO TWO DAYS PER WEEK.  MEATS/FISH SHOULD NOT BE FRIED, BAKED OR BROILED IS PREFERABLE.  IT IS ALSO IMPORTANT TO CUT BACK ON YOUR SUGAR INTAKE. PLEASE AVOID ANYTHING WITH ADDED SUGAR, CORN SYRUP OR OTHER SWEETENERS. IF YOU MUST USE A SWEETENER, YOU CAN TRY STEVIA. IT IS ALSO IMPORTANT TO AVOID ARTIFICIALLY SWEETENERS AND DIET BEVERAGES. LASTLY, I SUGGEST WEARING SPF 50 SUNSCREEN ON EXPOSED PARTS AND ESPECIALLY WHEN IN THE DIRECT SUNLIGHT FOR AN EXTENDED PERIOD OF TIME.  PLEASE AVOID FAST FOOD RESTAURANTS AND INCREASE YOUR WATER INTAKE. - CBC - Hemoglobin A1c - CMP14+EGFR - Lipid panel - TSH  2. Primary hypertension Comments: Chronic, controlled. EKG performed, NSR w/ nonspecific T abnormality.  She denies cp/sob. She is encouraged to follow low sodium diet. F/u in six months.  - EKG 12-Lead - POCT Urinalysis Dipstick (81002) - Microalbumin / Creatinine Urine Ratio  3. Class 3 severe obesity due to excess calories with serious comorbidity and body mass index (BMI) of 45.0 to 49.9 in adult ([facility]CC) BMI 49. She is encouraged to initially strive for BMI less than 40 to decrease cardiac risk. Advised to aim for at least 150 minutes of exercise per week.  Patient was given opportunity to ask questions. Patient verbalized understanding of the plan and was able to repeat key elements of the plan. All questions were answered to their satisfaction.   I, Maximino Greenland, MD, have reviewed all  documentation for this visit. The documentation on 09/09/21 for the exam, diagnosis, procedures, and orders are all accurate and complete.   THE PATIENT IS ENCOURAGED TO PRACTICE SOCIAL DISTANCING DUE TO THE COVID-19 PANDEMIC.

## 2021-09-09 NOTE — Patient Instructions (Signed)

## 2021-09-10 ENCOUNTER — Telehealth: Payer: Self-pay | Admitting: Urology

## 2021-09-10 ENCOUNTER — Other Ambulatory Visit: Payer: Self-pay | Admitting: Podiatry

## 2021-09-10 DIAGNOSIS — S86112D Strain of other muscle(s) and tendon(s) of posterior muscle group at lower leg level, left leg, subsequent encounter: Secondary | ICD-10-CM

## 2021-09-10 DIAGNOSIS — Z9889 Other specified postprocedural states: Secondary | ICD-10-CM

## 2021-09-10 LAB — CBC
Hematocrit: 41.3 % (ref 34.0–46.6)
Hemoglobin: 13.9 g/dL (ref 11.1–15.9)
MCH: 29 pg (ref 26.6–33.0)
MCHC: 33.7 g/dL (ref 31.5–35.7)
MCV: 86 fL (ref 79–97)
Platelets: 223 10*3/uL (ref 150–450)
RBC: 4.8 x10E6/uL (ref 3.77–5.28)
RDW: 13.1 % (ref 11.7–15.4)
WBC: 4.3 10*3/uL (ref 3.4–10.8)

## 2021-09-10 LAB — HEMOGLOBIN A1C
Est. average glucose Bld gHb Est-mCnc: 120 mg/dL
Hgb A1c MFr Bld: 5.8 % — ABNORMAL HIGH (ref 4.8–5.6)

## 2021-09-10 LAB — CMP14+EGFR
ALT: 15 IU/L (ref 0–32)
AST: 21 IU/L (ref 0–40)
Albumin/Globulin Ratio: 1.5 (ref 1.2–2.2)
Albumin: 4.5 g/dL (ref 3.8–4.8)
Alkaline Phosphatase: 55 IU/L (ref 44–121)
BUN/Creatinine Ratio: 18 (ref 9–23)
BUN: 9 mg/dL (ref 6–24)
Bilirubin Total: 0.3 mg/dL (ref 0.0–1.2)
CO2: 21 mmol/L (ref 20–29)
Calcium: 9.2 mg/dL (ref 8.7–10.2)
Chloride: 100 mmol/L (ref 96–106)
Creatinine, Ser: 0.49 mg/dL — ABNORMAL LOW (ref 0.57–1.00)
Globulin, Total: 3.1 g/dL (ref 1.5–4.5)
Glucose: 71 mg/dL (ref 70–99)
Potassium: 4.1 mmol/L (ref 3.5–5.2)
Sodium: 136 mmol/L (ref 134–144)
Total Protein: 7.6 g/dL (ref 6.0–8.5)
eGFR: 116 mL/min/{1.73_m2} (ref >59)

## 2021-09-10 LAB — LIPID PANEL
Chol/HDL Ratio: 2.1 ratio (ref 0.0–4.4)
Cholesterol, Total: 177 mg/dL (ref 100–199)
HDL: 83 mg/dL (ref >39)
LDL Chol Calc (NIH): 85 mg/dL (ref 0–99)
Triglycerides: 45 mg/dL (ref 0–149)
VLDL Cholesterol Cal: 9 mg/dL (ref 5–40)

## 2021-09-10 LAB — TSH: TSH: 1.79 u[IU]/mL (ref 0.450–4.500)

## 2021-09-10 LAB — MICROALBUMIN / CREATININE URINE RATIO
Creatinine, Urine: 39.5 mg/dL
Microalb/Creat Ratio: <8 mg/g creat (ref 0–29)
Microalbumin, Urine: <3 ug/mL

## 2021-09-10 NOTE — Telephone Encounter (Signed)
DOS - 10/09/21  REPAIR TENDON LEFT --- 40973 KINDER PROCEDURE LEFT --- 28238  [employer] EFFECTIVE DATE - 08/11/14  PLAN DEDUCTIBLE - $2,000.00 W/ $0.00 REMAINING OUT OF POCKET - $5,400.00 W/ $3,001.59 REMAINING COINSURANCE - 30% COPAY - $0.00   PER [employer]'SAUTOMATIVE SYSTEM FOR CPT CODES 53299 AND 24268 NO PRIOR AUTH IS REQUIRED.  REF # [id number]

## 2021-09-14 ENCOUNTER — Ambulatory Visit
Admission: RE | Admit: 2021-09-14 | Discharge: 2021-09-14 | Disposition: A | Discharge status: Home / Self Care | Payer: No Typology Code available for payment source | Source: Ambulatory Visit | Attending: Interventional Radiology | Admitting: Interventional Radiology

## 2021-09-14 DIAGNOSIS — D250 Submucous leiomyoma of uterus: Secondary | ICD-10-CM

## 2021-09-14 HISTORY — PX: IR RADIOLOGIST EVAL & MGMT: IMG5224

## 2021-09-14 LAB — POCT URINALYSIS DIPSTICK
Bilirubin, UA: NEGATIVE
Blood, UA: NEGATIVE
Glucose, UA: NEGATIVE
Ketones, UA: NEGATIVE
Leukocytes, UA: NEGATIVE
Nitrite, UA: NEGATIVE
Protein, UA: NEGATIVE
Spec Grav, UA: 1.025 (ref 1.010–1.025)
Urobilinogen, UA: 0.2 E.U./dL
pH, UA: 6.5 (ref 5.0–8.0)

## 2021-09-14 NOTE — Progress Notes (Signed)
Chief Complaint: Patient was consulted remotely today (TeleHealth) for follow-up after uterine fibroid embolization.  History of Present Illness: Amanda Paul is a 46 y.o. female status post uterine fibroid embolization on 10/08/2020.  After the procedure Amanda Paul noticed decreased protrusion of the uterus and did not have a menstrual cycle until October.  Since that time, her menstrual cycles have been definitely lighter with diminished bleeding.  She can still feel her uterus but states that it definitely is not as protuberant as prior to treatment.  She is not experiencing any pelvic pain or vaginal discharge.  A follow-up MRI was performed on 08/31/2021.  Past Medical History:  Diagnosis Date   Allergy    Arthritis    Hernia, umbilical    Morbid obesity ([place])     Past Surgical History:  Procedure Laterality Date   COLONOSCOPY WITH PROPOFOL N/A 06/13/2020   Procedure: COLONOSCOPY WITH PROPOFOL;  Surgeon: Carol Ada, MD;  Location: [facility];  Service: Endoscopy;  Laterality: N/A;   HEMOSTASIS CLIP PLACEMENT  06/13/2020   Procedure: HEMOSTASIS CLIP PLACEMENT;  Surgeon: Carol Ada, MD;  Location: [facility];  Service: Endoscopy;;   HERNIA REPAIR     HYSTEROSCOPY WITH D & C N/A 08/26/2014   Procedure: DILATATION AND CURETTAGE /HYSTEROSCOPY and cervical repair of cervical laceration ;  Surgeon: Delsa Bern, MD;  Location: [facility];  Service: Gynecology;  Laterality: N/A;   IR ANGIOGRAM PELVIS SELECTIVE OR SUPRASELECTIVE  10/08/2020   IR ANGIOGRAM PELVIS SELECTIVE OR SUPRASELECTIVE  10/08/2020   IR ANGIOGRAM SELECTIVE EACH ADDITIONAL VESSEL  10/08/2020   IR EMBO TUMOR ORGAN ISCHEMIA INFARCT INC GUIDE ROADMAPPING  10/08/2020   IR RADIOLOGIST EVAL & MGMT  07/10/2020   IR RADIOLOGIST EVAL & MGMT  07/31/2020   IR RADIOLOGIST EVAL & MGMT  09/09/2020   IR RADIOLOGIST EVAL & MGMT  11/18/2020   IR US GUIDE VASC ACCESS RIGHT  10/08/2020   POLYPECTOMY  06/13/2020   Procedure:  POLYPECTOMY;  Surgeon: Carol Ada, MD;  Location: [facility];  Service: Endoscopy;;   UMBILICAL HERNIA REPAIR     WISDOM TOOTH EXTRACTION      Allergies: Amoxicillin and Penicillins  Medications: Prior to Admission medications   Medication Sig Start Date End Date Taking? Authorizing Provider  acetaminophen (TYLENOL) 325 MG tablet Take 650 mg by mouth every 6 (six) hours as needed for moderate pain.    [provider]  amLODipine (NORVASC) 10 MG tablet Take 1 tablet (10 mg total) by mouth daily. 09/09/21   Amanda Chard, MD  Calcium Carb-Cholecalciferol (CALCIUM 500 +D PO) Take 1 tablet by mouth daily. Patient not taking: Reported on 09/09/2021    [provider]  CINNAMON PO Take 1 capsule by mouth daily.    [provider]  ibuprofen (ADVIL) 600 MG tablet Take 1 tablet (600 mg total) by mouth every 8 (eight) hours as needed. 07/28/21   Hyatt, Max T, DPM  magnesium oxide (MAG-OX) 400 MG tablet Take 400 mg by mouth daily.    [provider]  Multiple Vitamin (MULTIVITAMIN) capsule Take 1 capsule by mouth daily.    [provider]  rosuvastatin (CRESTOR) 10 MG tablet Take 1 tablet (10 mg total) by mouth daily. 09/09/21   Amanda Chard, MD  Vitamin D, Ergocalciferol, (DRISDOL) 1.25 MG (50000 UNIT) CAPS capsule Take 1 capsule (50,000 Units total) by mouth every 7 (seven) days. 08/05/21   Amanda Chard, MD     Family History  Problem  Relation Age of Onset   Hypertension Mother     Social History   Socioeconomic History   Marital status: Single    Spouse name: Not on file   Number of children: Not on file   Years of education: Not on file   Highest education level: Not on file  Occupational History   Not on file  Tobacco Use   Smoking status: Never   Smokeless tobacco: Never  Vaping Use   Vaping Use: Never used  Substance and Sexual Activity   Alcohol use: Yes    Alcohol/week: 1.0 standard drink    Types: 1 Cans of beer per week     Comment: rare   Drug use: No   Sexual activity: Not Currently  Other Topics Concern   Not on file  Social History Narrative   Marital status: single      Children: none      Lives: alone      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   Respiratory: Negative.    Cardiovascular: Negative.   Gastrointestinal: Negative.   Genitourinary: Negative.   Musculoskeletal: Negative.   Neurological: Negative.    Review of Systems: A 12 point ROS discussed and pertinent positives are indicated in the HPI above.  All other systems are negative.  Physical Exam No direct physical exam was performed (except for noted visual exam findings with Video Visits).   Vital Signs: LMP 09/04/2021   Imaging: MR PELVIS W WO CONTRAST  Result Date: 09/01/2021 CLINICAL DATA:  Follow-up uterine artery embolization. Large uterine fibroids. EXAM: MRI PELVIS WITHOUT AND WITH CONTRAST TECHNIQUE: Multiplanar multisequence MR imaging of the pelvis was performed both before and after administration of intravenous contrast. CONTRAST:  42m MULTIHANCE GADOBENATE DIMEGLUMINE 529 MG/ML IV SOLN COMPARISON:  07/23/2020 FINDINGS: Urinary Tract:  No abnormality visualized. Bowel:  Unremarkable visualized pelvic bowel loops. Vascular/Lymphatic: No pathologically enlarged lymph nodes. No significant vascular abnormality seen. Reproductive: Uterus: Measures 18.1 by 12.5 by 10.7 cm (volume = 1270 cm^3), image 34/3 and image 28/12. On the previous exam the uterus measured 19.5 by 14.7 by 11.6 cm. (Volume = 1740 cm^3). Multiple large uterine fibroids are again noted. Most of these exhibit signs of interval internal cystic degeneration. -dominant uterine  fibroid within the left uterine body measures 9.3 x 10.0 cm on today's study. On the subtraction images there is a thin rim of residual enhancing tissue noted anteriorly, image 39/14. The remainder of this mass demonstrates no significant internal enhancement. On the previous exam this exhibited diffuse internal enhancement measuring 11.0 x 12.2 cm. -more superior fibroid within the right aspect of the uterine fundus measures 7.7 x 8.2 cm, image 26/14. No internal enhancement identified on the subtraction images. Previously this exhibited diffuse internal enhancement and measured 9.2 x 9.5 cm. -index fibroid within the right uterine body measures 3.0 x 3.7 cm without internal enhancement on the subtraction images, image 26/13. On the previous exam this demonstrated diffuse internal enhancement measuring 4.2 x 4.5 cm. -Within the anterior lower uterine segment there is a subserosal fibroid which measures 4.2 x 3.2 cm, image 54/13. This continues to show diffuse internal enhancement.  On the previous exam this measured 4.0 x 2.8 cm. -Partially treated subserosal fibroid arising off the right anterior uterine corpus shows partial internal cystic degeneration with persistent areas of enhancement. This measures 2.9 x 2.6 cm, image 45/13. Previously 5.2 x 3.2 cm. Endometrium: Measures 8 mm, image 22/12. Fibroids demonstrate mass effect upon the endometrium. No definite focal endometrial abnormality noted. Right ovary: Measures 4.2 x 3.5 by 3.5 cm. Dominant follicle measures 2.2 cm, image 26/6. Left ovary: The left ovary is difficult to confidently identified. Other:  Trace fluid noted within the cul-de-sac. Musculoskeletal: No suspicious bone lesions identified. IMPRESSION: 1. Interval decrease in size of the uterus status post uterine artery embolization. 2. The previously noted dominant uterine fibroids have undergone interval cystic degeneration without signs of significant internal enhancement. 3. Untreated fibroid  arising off the subserosal aspect of the anterior lower uterine segment is similar in size to the previous exam and continues to exhibit internal enhancement. 4. Partially treated subserosal fibroid arising off the right anterior lower uterine segment has decreased in size in the interval with partial internal cystic degeneration. 5. Trace fluid noted within the cul-de-sac. Electronically Signed   By: Kerby Moors M.D.   On: 09/01/2021 10:58    Labs:  CBC: Recent Labs    10/08/20 0840 09/09/21 1521  WBC 5.0 4.3  HGB 12.6 13.9  HCT 40.9 41.3  PLT 274 223    COAGS: Recent Labs    10/08/20 0840  INR 0.9    BMP: Recent Labs    10/08/20 0840 05/06/21 1036 09/09/21 1521  NA 138 143 136  K 3.7 4.2 4.1  CL 106 105 100  CO2 '26 21 21  '$ GLUCOSE 96 75 71  BUN '17 16 9  '$ CALCIUM 9.3 9.2 9.2  CREATININE 0.49 0.62 0.49*  GFRNONAA >60  --   --     LIVER FUNCTION TESTS: Recent Labs    09/09/21 1521  BILITOT 0.3  AST 21  ALT 15  ALKPHOS 55  PROT 7.6  ALBUMIN 4.5     Assessment and Plan:  I spoke with Ms. Drost over the phone.  We reviewed MRI findings from the follow-up MRI dated 08/31/2021.  Uterine volume was estimated to be approximately 1270 mL.  Uterine volume estimates on the pre-procedural MRI were likely underestimated and by my measurements I estimated the uterine volume prior to treatment as 2600 mL.  A single anterior 4 cm fibroid demonstrates continued enhancement after embolization and a right-sided subserosal fibroid shows significant decrease in size but an area of partial enhancement.  The rest of fibroid tissue demonstrates complete necrosis after embolization.  I told Ms. Stuber that this is an excellent result given the massive size of her uterus prior to treatment and the large volume of enhancing fibroid tissue present prior to embolization.  Uterine volume has diminished by greater than 50% in just under 11 months and I would anticipate that some of the  necrotic tissue will continue to decrease in size over time.  No further routine follow-up is necessary post embolization unless there are any new symptoms of concern.  She will continue to follow-up with Dr. Ihor Dow for gynecologic care and Dr. Baird Cancer for primary care.  Electronically Signed: Azzie Roup 09/14/2021, 8:56 AM    I spent a total of  15 Minutes in remote  clinical consultation, greater than 50% of which was counseling/coordinating care for uterine fibroids.    Visit type: Audio only (telephone). Audio (no video)  only due to patient's lack of internet/smartphone capability. Alternative for in-person consultation at [facility], [place] [address], [place], [place]. This visit type was conducted due to national recommendations for restrictions regarding the COVID-19 Pandemic (e.g. social distancing).  This format is felt to be most appropriate for this patient at this time.  All issues noted in this document were discussed and addressed.

## 2021-10-05 ENCOUNTER — Encounter: Payer: Self-pay | Admitting: Internal Medicine

## 2021-10-05 ENCOUNTER — Other Ambulatory Visit: Payer: Self-pay

## 2021-10-05 MED ORDER — VITAMIN D (ERGOCALCIFEROL) 1.25 MG (50000 UNIT) PO CAPS: 50000.0000 [IU] | ORAL_CAPSULE | ORAL | 1 refills | Status: DC

## 2021-10-07 ENCOUNTER — Other Ambulatory Visit: Payer: Self-pay | Admitting: Podiatry

## 2021-10-07 MED ORDER — ONDANSETRON HCL 4 MG PO TABS: 4.0000 mg | ORAL_TABLET | Freq: Three times a day (TID) | ORAL | 0 refills | Status: DC | PRN

## 2021-10-07 MED ORDER — CLINDAMYCIN HCL 150 MG PO CAPS: 150.0000 mg | ORAL_CAPSULE | Freq: Three times a day (TID) | ORAL | 0 refills | Status: DC

## 2021-10-07 MED ORDER — OXYCODONE-ACETAMINOPHEN 10-325 MG PO TABS: 1.0000 | ORAL_TABLET | Freq: Three times a day (TID) | ORAL | 0 refills | Status: AC | PRN

## 2021-10-09 DIAGNOSIS — S86112D Strain of other muscle(s) and tendon(s) of posterior muscle group at lower leg level, left leg, subsequent encounter: Secondary | ICD-10-CM | POA: Diagnosis not present

## 2021-10-09 HISTORY — PX: ADVANCEMENT / RECONSTRUCTION POSTERIOR TIBIAL TENDON / KIDNER: SUR17

## 2021-10-15 ENCOUNTER — Ambulatory Visit (INDEPENDENT_AMBULATORY_CARE_PROVIDER_SITE_OTHER): Payer: No Typology Code available for payment source | Admitting: Podiatry

## 2021-10-15 ENCOUNTER — Ambulatory Visit (INDEPENDENT_AMBULATORY_CARE_PROVIDER_SITE_OTHER): Payer: No Typology Code available for payment source

## 2021-10-15 ENCOUNTER — Encounter: Payer: Self-pay | Admitting: Podiatry

## 2021-10-15 VITALS — BP 138/79 | HR 81 | Temp 97.6°F

## 2021-10-15 DIAGNOSIS — S86112D Strain of other muscle(s) and tendon(s) of posterior muscle group at lower leg level, left leg, subsequent encounter: Secondary | ICD-10-CM

## 2021-10-15 DIAGNOSIS — Z9889 Other specified postprocedural states: Secondary | ICD-10-CM

## 2021-10-15 NOTE — Progress Notes (Signed)
She presents today for postop visit date of surgery 10/09/2021 Kidner procedure posterior tibial tendon repair left.  States that is doing fine she had no problems with it really has not hurt at all.  She denies fever chills nausea vomiting muscle aches pains calf pain back pain chest pain shortness of breath.  Objective: Presents today utilizing knee scooter cast nonweightbearing cast is dry and clean.  Her toes have good sensation and the cast is not tight proximally.  Vital signs are stable and radiographs taken today demonstrate resection of the hypertrophic tuberosity of the navicular and a intact anchor is placed.  Assessment: Well-healing surgical foot.  Plan: She will follow-up with [place] in 1 week for cast removal most likely the staples will remain intact she will be redressed and a new cast applied.

## 2021-10-22 ENCOUNTER — Encounter: Payer: No Typology Code available for payment source | Admitting: Podiatry

## 2021-10-27 ENCOUNTER — Encounter: Payer: Self-pay | Admitting: Podiatry

## 2021-11-05 ENCOUNTER — Ambulatory Visit (INDEPENDENT_AMBULATORY_CARE_PROVIDER_SITE_OTHER): Payer: No Typology Code available for payment source | Admitting: Podiatry

## 2021-11-05 ENCOUNTER — Encounter: Payer: Self-pay | Admitting: Podiatry

## 2021-11-05 DIAGNOSIS — Z9889 Other specified postprocedural states: Secondary | ICD-10-CM

## 2021-11-05 DIAGNOSIS — S86112D Strain of other muscle(s) and tendon(s) of posterior muscle group at lower leg level, left leg, subsequent encounter: Secondary | ICD-10-CM | POA: Diagnosis not present

## 2021-11-05 NOTE — Progress Notes (Signed)
Presents today for postop visit #2 date of surgery 10/09/2021 she has been dealing with her sick mother that the reason she missed her last appointment.  She is a status post posterior tibial tendon repair left with cast.  Objective: Vital signs are stable she is alert and oriented x3.  Pulses are palpable.  Cast was removed dry and clean dry sterile dressing was removed.  Incision site is dry and clean staples are intact.  No signs of infection minimal edema  Assessment: Well-healing surgical foot.  Plan: Redressed today dressed a compressive dressing cast padding cast application continue nonweightbearing status follow-up with her in 2 weeks for cast removal.

## 2021-11-19 ENCOUNTER — Encounter: Payer: No Typology Code available for payment source | Admitting: Podiatry

## 2021-11-26 ENCOUNTER — Encounter: Payer: Self-pay | Admitting: Podiatry

## 2021-11-26 ENCOUNTER — Ambulatory Visit (INDEPENDENT_AMBULATORY_CARE_PROVIDER_SITE_OTHER): Payer: No Typology Code available for payment source | Admitting: Podiatry

## 2021-11-26 DIAGNOSIS — S86112D Strain of other muscle(s) and tendon(s) of posterior muscle group at lower leg level, left leg, subsequent encounter: Secondary | ICD-10-CM

## 2021-11-26 DIAGNOSIS — Z9889 Other specified postprocedural states: Secondary | ICD-10-CM

## 2021-11-26 NOTE — Progress Notes (Signed)
Presents today for follow up visit status post Kidner procedure left.  She denies fever chills nausea vomiting ready get his cast off a regular rate of her knee scooter.  She is 7 weeks out.  Objective: Vital signs are stable oriented x3 cast is intact slightly guarded on the bottom was removed demonstrates dry sterile dressing intact and staples.  All of this was removed today she has dry scaly skin no open lesions or wounds she has good range of motion of the foot.  Assessment: Well-healing surgical foot with xerosis.  Status post posterior tibial tendon repair.  Plan: Placed her in a compression dressing today which she will remove to start the use of an anklet and a new cam walker.  She will continue partial weightbearing and I will follow-up with her in 2 weeks at which time we will hopefully going to full weightbearing.

## 2021-12-03 ENCOUNTER — Encounter: Payer: Self-pay | Admitting: Internal Medicine

## 2021-12-03 ENCOUNTER — Encounter: Payer: Self-pay | Admitting: Podiatry

## 2021-12-03 DIAGNOSIS — G8929 Other chronic pain: Secondary | ICD-10-CM

## 2021-12-03 LAB — HEMOGLOBIN A1C: Hemoglobin A1C: 5.9

## 2021-12-03 LAB — BASIC METABOLIC PANEL: Glucose: 84

## 2021-12-03 LAB — LIPID PANEL: LDL Cholesterol: 74

## 2021-12-03 NOTE — Telephone Encounter (Signed)
Pt returned someone's  call and is scheduled to see Dr Milinda Pointer 8.31 @ 130pm

## 2021-12-10 ENCOUNTER — Ambulatory Visit (INDEPENDENT_AMBULATORY_CARE_PROVIDER_SITE_OTHER): Payer: No Typology Code available for payment source | Admitting: Podiatry

## 2021-12-10 ENCOUNTER — Encounter: Payer: Self-pay | Admitting: Podiatry

## 2021-12-10 DIAGNOSIS — S86112D Strain of other muscle(s) and tendon(s) of posterior muscle group at lower leg level, left leg, subsequent encounter: Secondary | ICD-10-CM | POA: Diagnosis not present

## 2021-12-10 DIAGNOSIS — Z9889 Other specified postprocedural states: Secondary | ICD-10-CM

## 2021-12-10 NOTE — Progress Notes (Signed)
She presents today date of service 10/09/2021 status post Kidner procedure with repair of the posterior tibial tendon left foot.  She presents today walking in her tennis shoes and her socks.  No cam boot no brace.  She states that she has been doing this for the past few days and really does not hurt she is very happy being off of her crutches and off of her knee scooter.  She is ready to return to work and would like to go back immediately full-time but with the option to sit.  Objective: Vital signs stable alert and oriented x3.  Pulses are palpable.  There is no erythema just mild edema no cellulitis drainage odor no pain on palpation of the posterior tibial tendon no pain on inversion against resistance of the posterior tibial tendon.  Assessment: Well-healing surgical foot.  Plan: I would like for her to wear a Tri-Lock brace for the next month with her tennis shoes particular starting back to work.  I did encourage her that if she is going to go for long walks or shopping that she take her cam boot with her so she may transition into the cam boot as necessary.  I will follow-up with her in 1 month she will call with questions or concerns.

## 2021-12-21 ENCOUNTER — Telehealth: Payer: Self-pay | Admitting: *Deleted

## 2021-12-21 NOTE — Telephone Encounter (Signed)
Patient is requesting a sooner appointment , foot has started to swell again,not painful, started back wearing her brace, elevating as much as possible.

## 2021-12-24 ENCOUNTER — Encounter: Payer: Self-pay | Admitting: Podiatrist

## 2021-12-24 ENCOUNTER — Ambulatory Visit (INDEPENDENT_AMBULATORY_CARE_PROVIDER_SITE_OTHER): Payer: No Typology Code available for payment source | Admitting: Podiatrist

## 2021-12-24 DIAGNOSIS — S86112D Strain of other muscle(s) and tendon(s) of posterior muscle group at lower leg level, left leg, subsequent encounter: Secondary | ICD-10-CM

## 2021-12-24 DIAGNOSIS — Z9889 Other specified postprocedural states: Secondary | ICD-10-CM

## 2021-12-24 NOTE — Progress Notes (Unsigned)
Chief Complaint  Patient presents with   Routine Post Op    DOS 10/09/2021 [place] [facility]     "Its been real swollen since being back at work, but it feels okay"  Patient still wearing trilok brace     HPI: Patient is 46 y.o. female who presents today for post op check of her left foot following Kidner procedure and Posterior tibial tendon repair left foot.  She relates she was doing well and then started back at work.  When standing at work her foot has been swelling up. She denies much pain, but swelling is bothersome.     Allergies  Allergen Reactions   Amoxicillin Hives, Itching and Swelling    Swollen eyes and throat.   Penicillins Hives and Swelling    Review of systems is negative except as noted in the HPI.  Denies nausea/ vomiting/ fevers/ chills or night sweats.   Denies difficulty breathing, denies calf pain or tenderness  Physical Exam  Patient is awake, alert, and oriented x 3.  In no acute distress.    Vascular status is intact with palpable pedal pulses DP and PT bilateral and capillary refill time less than 3 seconds bilateral.  Mild to moderate post operative edema noted medially.   Neurological exam reveals epicritic and protective sensation grossly intact bilateral.   Dermatological exam reveals skin is supple and dry to bilateral feet.  No open lesions present.  Incision site is fully healed.   Musculoskeletal exam: Musculature intact with dorsiflexion, plantarflexion, inversion, eversion. Minimal discomfort noted at the navicular insertion of the posterior tibial tendon.  Otherwise her foot is feeling well.    Assessment:   ICD-10-CM   1. Traumatic rupture of left posterior tibial tendon, subsequent encounter  S86.112D     2. Status post left foot surgery  Z98.890        Plan: Recommended  continued use of her ankle brace at work.  Discussed the ankle swelling will slowly decrease over time as the repair continues to heal.   Recommeded continued compression and ice when needed.  She will return for her scheduled post op check with Dr. Milinda Pointer and if any concerns aries prior to that visit she will call

## 2021-12-28 NOTE — Addendum Note (Signed)
Addended by: Clovis Riley E on: 12/28/2021 05:15 PM   Modules accepted: Orders

## 2021-12-30 ENCOUNTER — Ambulatory Visit: Payer: No Typology Code available for payment source | Attending: Podiatry

## 2021-12-30 ENCOUNTER — Other Ambulatory Visit: Payer: Self-pay

## 2021-12-30 DIAGNOSIS — M25562 Pain in left knee: Secondary | ICD-10-CM | POA: Insufficient documentation

## 2021-12-30 DIAGNOSIS — G8929 Other chronic pain: Secondary | ICD-10-CM | POA: Insufficient documentation

## 2021-12-30 DIAGNOSIS — M25561 Pain in right knee: Secondary | ICD-10-CM | POA: Diagnosis present

## 2021-12-30 DIAGNOSIS — R262 Difficulty in walking, not elsewhere classified: Secondary | ICD-10-CM | POA: Insufficient documentation

## 2021-12-30 DIAGNOSIS — M6281 Muscle weakness (generalized): Secondary | ICD-10-CM | POA: Diagnosis present

## 2021-12-30 DIAGNOSIS — M25572 Pain in left ankle and joints of left foot: Secondary | ICD-10-CM | POA: Diagnosis not present

## 2021-12-30 DIAGNOSIS — R600 Localized edema: Secondary | ICD-10-CM | POA: Insufficient documentation

## 2021-12-30 NOTE — Therapy (Signed)
OUTPATIENT PHYSICAL THERAPY LOWER EXTREMITY EVALUATION   Patient Name: Amanda Paul MRN: [id number] DOB:March 31, 1976, 46 y.o., female Today's Date: 12/30/2021   PT End of Session - 12/30/21 1146     Visit Number 1    Number of Visits 8    Date for PT Re-Evaluation 03/03/22    Authorization Type Aet[employer]    Authorization Time Period FOTO v6, v10    PT Start Time 1100    PT Stop Time 1147    PT Time Calculation (min) 47 min    Activity Tolerance Patient tolerated treatment well    Behavior During Therapy WFL for tasks assessed/performed             Past Medical History:  Diagnosis Date   Allergy    Arthritis    Hernia, umbilical    Morbid obesity ([place])    Past Surgical History:  Procedure Laterality Date   ADVANCEMENT / RECONSTRUCTION POSTERIOR TIBIAL TENDON / Vincente Liberty Left 10/09/2021   COLONOSCOPY WITH PROPOFOL N/A 06/13/2020   Procedure: COLONOSCOPY WITH PROPOFOL;  Surgeon: Carol Ada, MD;  Location: [facility];  Service: Endoscopy;  Laterality: N/A;   HEMOSTASIS CLIP PLACEMENT  06/13/2020   Procedure: HEMOSTASIS CLIP PLACEMENT;  Surgeon: Carol Ada, MD;  Location: [facility];  Service: Endoscopy;;   HERNIA REPAIR     HYSTEROSCOPY WITH D & C N/A 08/26/2014   Procedure: DILATATION AND CURETTAGE /HYSTEROSCOPY and cervical repair of cervical laceration ;  Surgeon: Delsa Bern, MD;  Location: [facility];  Service: Gynecology;  Laterality: N/A;   IR ANGIOGRAM PELVIS SELECTIVE OR SUPRASELECTIVE  10/08/2020   IR ANGIOGRAM PELVIS SELECTIVE OR SUPRASELECTIVE  10/08/2020   IR ANGIOGRAM SELECTIVE EACH ADDITIONAL VESSEL  10/08/2020   IR EMBO TUMOR ORGAN ISCHEMIA INFARCT INC GUIDE ROADMAPPING  10/08/2020   IR RADIOLOGIST EVAL & MGMT  07/10/2020   IR RADIOLOGIST EVAL & MGMT  07/31/2020   IR RADIOLOGIST EVAL & MGMT  09/09/2020   IR RADIOLOGIST EVAL & MGMT  11/18/2020   IR RADIOLOGIST EVAL & MGMT  09/14/2021   IR US GUIDE VASC ACCESS RIGHT  10/08/2020   POLYPECTOMY   06/13/2020   Procedure: POLYPECTOMY;  Surgeon: Carol Ada, MD;  Location: [facility];  Service: Endoscopy;;   UMBILICAL HERNIA REPAIR     WISDOM TOOTH EXTRACTION     Patient Active Problem List   Diagnosis Date Noted   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   Uterine leiomyoma 10/08/2020   Cervical high risk HPV (human papillomavirus) test positive 07/31/2019   Pain in right hand 01/19/2019   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   Fibroids 09/30/2015   Hypertension 05/10/2012   Morbid obesity ([place])    Umbilical hernia 85/27/7824   Abnormal cervical Papanicolaou smear 05/13/2002   Low grade squamous intraepithelial lesion (LGSIL) on cervicovaginal cytologic smear 06/10/2000    PCP: Glendale Chard, MD  REFERRING PROVIDER: Garrel Ridgel, DPM  REFERRING DIAG: M25.561,M25.562,G89.29 (ICD-10-CM) - Chronic pain of both knees  THERAPY DIAG:  Acute pain of left knee - Plan: PT plan of care cert/re-cert  Acute pain of right knee - Plan: PT plan of care cert/re-cert  Localized edema - Plan: PT plan of care cert/re-cert  Difficulty in walking, not elsewhere classified - Plan: PT plan of care cert/re-cert  Muscle weakness (generalized) - Plan: PT plan of care cert/re-cert  Rationale for Evaluation and Treatment Rehabilitation  ONSET DATE: 10/09/2021  SUBJECTIVE:   SUBJECTIVE STATEMENT: Pt reports primary c/o subacute BIL Lt> Rt knee pain lasting a  few weeks since discontinuing her Lt ankle boot following a Kidner procedure posterior tibial tendon repair left on 10/09/2021. 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 Currently, she has "excruciating" pain in BIL knee when walking after sitting for any amount of time. She also endorses Lt lower leg swelling since her surgery. Pt denies any N/T related to this problem. She also denies any popping/ clicking, catching/ locking, or buckling. Current pain is 2/10. Worst pain  is 7/10. Best is 2/10. Aggravating factors include walking following sitting, standing/ walking >1 hour, turning in bed, descending stairs at her apartment. Easing factors include ice, elevation, and ibuprofen.   PERTINENT HISTORY: Kidner procedure posterior tibial tendon repair left on 10/09/2021, HTN  PAIN:  Are you having pain? Yes: NPRS scale: 2/10 Pain location: BIL knees Pain description: burning, dull ache Aggravating factors: walking following sitting, standing/ walking >1 hour, turning in bed, descending stairs at her apartment Relieving factors: ice, elevation, and ibuprofen  PRECAUTIONS: None  WEIGHT BEARING RESTRICTIONS No  FALLS:  Has patient fallen in last 6 months? No  LIVING ENVIRONMENT: Lives with: lives with their family Lives in: House/apartment Stairs: Yes: External: 18 steps; on right going up, on left going up, and can reach both Has following equipment at home: Crutches  OCCUPATION: Psychologist, counselling at [employer]: [employer] Return to working with less pain, walk for exercise   OBJECTIVE:   DIAGNOSTIC FINDINGS: 08/12/2021: MR Ankle Left WO Contrast: IMPRESSION: 1. Mild posterior tibial tenosynovitis and distal insertional tendinosis. Thin linear partial-thickness tear involving the medial 50% of the distal tendon insertion on the navicular tuberosity. The navicular tuberosity is large suggesting a type 3 os naviculare (cornuate navicular) normal variant. 2. Partial-thickness longitudinal split tear of the peroneus brevis tendon. 3. Remote partial-thickness tear and sprain of the anterior talofibular ligament. 4. 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 5. 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  10/15/2021: DG Lt Foot: resection of the hypertrophic tuberosity of the navicular and a intact anchor is placed.  Assessment: Well-healing surgical  foot  PATIENT SURVEYS:  FOTO 82%, predicted 85% in 8 visits  COGNITION:  Overall cognitive status: Within functional limits for tasks assessed     SENSATION: Not tested  EDEMA:  Circumferential: At tibial plateau: Rt: 45cm, Lt: 44cm   MUSCLE LENGTH: Hamstrings: WNL BIL   POSTURE:  BIL genu valgum in stance  PALPATION: No TTP, BIL patella alta   PASSIVE ACCESSORIES: Hypomobile Lt patellar mobility in all planes  LOWER EXTREMITY ROM:  A/PROM Right eval Left eval  Knee flexion 108/118 100/110p!  Knee extension 0/2 0/2   (Blank rows = not tested)  LOWER EXTREMITY MMT:  MMT Right eval Left eval  Hip flexion 3+/5 3+/5  Hip extension 3+/5 3+/5  Hip abduction 3/5 3/5  Knee flexion 5/5 4+/5  Knee extension 5/5 5/5  Ankle dorsiflexion 5/5 5/5  Ankle plantarflexion 5/5 5/5  Ankle inversion 5/5 5/5  Ankle eversion 5/5 5/5   (Blank rows = not tested)  SPECIAL TESTS:  Patellar apprehension: (+) BIL Eccentric step test: (+) on Lt for decreased eccentric control and pain Patellar compression: (+) BIL Lateral pull sign: (+) BIL Thessaly at 0/20 degrees of knee flexion: (-) BIL McMurray's: (-) BIL Apley's: (-) BIL  FUNCTIONAL TESTS:  5xSTS: 15.4 seconds Squat: 75%, no pain  DL heel raise x25: WNL   GAIT: Distance walked: 20 ft Assistive device utilized: None Level of assistance: Modified independence due to use of Lt ankle brace Comments: Lt antalgic gait pattern with decreased Lt step length and slow gait speed    TODAY'S TREATMENT: 12/30/2021: Demonstrated and issued HEP   PATIENT EDUCATION:  Education details: Pt educated on probable underlying pathophysiology behind pain presentation, POC, prognosis, FOTO, and HEP Person educated: Patient Education method: Explanation, Demonstration, and Handouts Education comprehension: verbalized understanding and returned demonstration   HOME EXERCISE PROGRAM: Access Code: [email or web address] URL:  [email or web address] Date: 12/30/2021 Prepared by: Vanessa [place]  Exercises - Squat  - 1 x daily - 7 x weekly - 3 sets - 15 reps - Sidelying Hip Abduction  - 1 x daily - 7 x weekly - 2 sets - 10 reps - 5 seconds hold - Forward Step Down Touch with Heel  - 1 x daily - 7 x weekly - 3 sets - 10 reps - Supine 90/90 Abdominal Bracing  - 1 x daily - 7 x weekly - 4 sets - 30 seconds hold  ASSESSMENT:  CLINICAL IMPRESSION: Patient is a 46 y.o. F who was seen today for physical therapy evaluation and treatment for subacute BIL Lt>Rt knee pain.  Upon assessment, pt's primary impairments include pain and lack of eccentric control at Lt knee during eccentric step-downs, hypomobile and painful Lt knee global patellofemoral passive accessory mobility, weak global BIL hip strength, moderate swelling about BIL calves/ knees, and limited Lt knee flexion AROM with pain with overpressure. Ruling up BIL PFPS due to positive cluster testing BIL, as well as pain and lack of eccentric control with eccentric step-down test on Lt. Ruling down meniscal pathology due to no TTP to joint line, no painful clicking, and negative special testing. Pt will benefit from skilled PT to address her primary impairments and return to her prior level of function with less limitation.   OBJECTIVE IMPAIRMENTS Abnormal gait, decreased activity tolerance, decreased balance, decreased endurance, decreased mobility, difficulty walking, decreased ROM, decreased strength, hypomobility, increased edema, impaired flexibility, improper body mechanics, postural dysfunction, and pain.   ACTIVITY LIMITATIONS carrying, lifting, bending, sitting, standing, squatting, sleeping, stairs, transfers, and locomotion level  PARTICIPATION LIMITATIONS: cleaning, laundry, driving, community activity, occupation, and yard work  PERSONAL FACTORS  N/A  are also affecting patient's functional outcome.   REHAB POTENTIAL: Excellent  CLINICAL  DECISION MAKING: Stable/uncomplicated  EVALUATION COMPLEXITY: Low   GOALS: Goals reviewed with patient? Yes  SHORT TERM GOALS: Target date: 01/27/2022  Pt will report understanding and adherence to initial HEP in order to promote independence in the management of primary impairments. Baseline: HEP provided at eval Goal status: INITIAL    LONG TERM GOALS: Target date: 02/24/2022   Pt will achieve a FOTO score of 85% in order to demonstrate improved functional ability as it relates to the pt's primary impairments. Baseline: 82% Goal status: INITIAL  2.  Pt will achieve a 5xSTS in <12 seconds in order to demonstrate improved functional ability with community transfers. Baseline: 15.4 seconds Goal status: INITIAL  3.  Pt will achieve BIL global hip strength of 4+/5 in order to progress her independent LE strengthening regimen with less limitation. Baseline: See MMT chart Goal status: INITIAL  4.  Pt will report ability to stand/ walk >2 hours with 0-2/10 pain in order to complete work duties with less limitation. Baseline: >6/10 pain with 1 hour of standing/ walking Goal status: INITIAL  5.  Pt will demonstrate ability to ascend/ descend 20 stairs with 0-2/10 pain in order to enter and exit her apartment with less limitation. Baseline: >6/10 pain with single eccentric step-down in clinic Goal status: INITIAL    PLAN: PT FREQUENCY: 1x/week  PT DURATION: 8 weeks  PLANNED INTERVENTIONS: Therapeutic exercises, Therapeutic activity, Neuromuscular re-education, Balance training, Gait training, Patient/Family education, Self Care, Joint mobilization, Joint manipulation, Stair training, Aquatic Therapy, Dry Needling, Electrical stimulation, Cryotherapy, Moist heat, Taping, Vasopneumatic device, Biofeedback, Ionotophoresis '4mg'$ /ml Dexamethasone, Manual therapy, and Re-evaluation  PLAN FOR NEXT SESSION: Progress quad reinforcement, hip strengthening, knee alignment   Vanessa [place], PT, DPT 12/30/21 12:02 PM

## 2022-01-05 ENCOUNTER — Encounter: Payer: Self-pay | Admitting: Physical Therapy

## 2022-01-06 ENCOUNTER — Ambulatory Visit: Payer: No Typology Code available for payment source

## 2022-01-07 ENCOUNTER — Ambulatory Visit (INDEPENDENT_AMBULATORY_CARE_PROVIDER_SITE_OTHER): Payer: No Typology Code available for payment source | Admitting: Podiatry

## 2022-01-07 ENCOUNTER — Encounter: Payer: Self-pay | Admitting: Podiatry

## 2022-01-07 ENCOUNTER — Encounter: Payer: No Typology Code available for payment source | Admitting: Podiatry

## 2022-01-07 DIAGNOSIS — S86112D Strain of other muscle(s) and tendon(s) of posterior muscle group at lower leg level, left leg, subsequent encounter: Secondary | ICD-10-CM

## 2022-01-07 DIAGNOSIS — Z9889 Other specified postprocedural states: Secondary | ICD-10-CM

## 2022-01-09 NOTE — Progress Notes (Signed)
She presents today date of surgery 10/09/2021 she is status post Kidner procedure with posterior tibial tendon repair left.  States that it swells but other than that it is fantastic is doing great.  I did get the PT referral for my knees but I wanted to get it checked by orthopedics I got a shot.  She states that is starting to feel much better in that left leg.  Objective: Vital signs are stable she is alert and oriented x3.  Pulses are palpable.  There is minimal edema no erythema cellulitis drainage or odor she has great inversion against resistance scar is gone on to heal uneventfully.  Assessment: Well-healing left posterior tibial tendon repair.  Plan: We will go to go ahead and schedule her for orthotics to help maintain the correction of this posterior tibial tendon repair and I will follow-up with her once those come and if necessary.

## 2022-01-11 ENCOUNTER — Ambulatory Visit: Payer: No Typology Code available for payment source | Attending: Podiatry | Admitting: Physical Therapy

## 2022-01-11 ENCOUNTER — Encounter: Payer: Self-pay | Admitting: Physical Therapy

## 2022-01-11 DIAGNOSIS — M6281 Muscle weakness (generalized): Secondary | ICD-10-CM | POA: Insufficient documentation

## 2022-01-11 DIAGNOSIS — R262 Difficulty in walking, not elsewhere classified: Secondary | ICD-10-CM | POA: Insufficient documentation

## 2022-01-11 DIAGNOSIS — M25561 Pain in right knee: Secondary | ICD-10-CM | POA: Insufficient documentation

## 2022-01-11 DIAGNOSIS — M25562 Pain in left knee: Secondary | ICD-10-CM | POA: Diagnosis present

## 2022-01-11 DIAGNOSIS — R600 Localized edema: Secondary | ICD-10-CM | POA: Insufficient documentation

## 2022-01-11 NOTE — Therapy (Signed)
OUTPATIENT PHYSICAL THERAPY TREATMENT NOTE   Patient Name: Amanda Paul MRN: [id number] DOB:1975/04/19, 46 y.o., female Today's Date: 01/11/2022  PCP: Glendale Chard, MD   REFERRING PROVIDER: Garrel Ridgel, DPM   PT End of Session - 01/11/22 1312     Visit Number 2    Number of Visits 8    Date for PT Re-Evaluation 03/03/22    Authorization Type Aetna    Authorization Time Period FOTO v6, v10    PT Start Time 1315    PT Stop Time 1356    PT Time Calculation (min) 41 min    Activity Tolerance Patient tolerated treatment well    Behavior During Therapy WFL for tasks assessed/performed             Past Medical History:  Diagnosis Date   Allergy    Arthritis    Hernia, umbilical    Morbid obesity ([place])    Past Surgical History:  Procedure Laterality Date   ADVANCEMENT / [place] / Vincente Liberty Left 10/09/2021   COLONOSCOPY WITH PROPOFOL N/A 06/13/2020   Procedure: COLONOSCOPY WITH PROPOFOL;  Surgeon: Carol Ada, MD;  [facility]: [facility];  Service: Endoscopy;  Laterality: N/A;   HEMOSTASIS CLIP PLACEMENT  06/13/2020   Procedure: HEMOSTASIS CLIP PLACEMENT;  Surgeon: Carol Ada, MD;  Location: [facility];  Service: Endoscopy;;   HERNIA REPAIR     HYSTEROSCOPY WITH D & C N/A 08/26/2014   Procedure: DILATATION AND CURETTAGE /HYSTEROSCOPY and cervical repair of cervical laceration ;  Surgeon: Delsa Bern, MD;  Location: [facility];  Service: Gynecology;  Laterality: N/A;   IR ANGIOGRAM PELVIS SELECTIVE OR SUPRASELECTIVE  10/08/2020   IR ANGIOGRAM PELVIS SELECTIVE OR SUPRASELECTIVE  10/08/2020   IR ANGIOGRAM SELECTIVE EACH ADDITIONAL VESSEL  10/08/2020   IR EMBO TUMOR ORGAN ISCHEMIA INFARCT INC GUIDE ROADMAPPING  10/08/2020   IR RADIOLOGIST EVAL & MGMT  07/10/2020   IR RADIOLOGIST EVAL & MGMT  07/31/2020   IR RADIOLOGIST EVAL & MGMT  09/09/2020   IR RADIOLOGIST EVAL & MGMT  11/18/2020   IR RADIOLOGIST EVAL & MGMT  09/14/2021    IR US GUIDE VASC ACCESS RIGHT  10/08/2020   POLYPECTOMY  06/13/2020   Procedure: POLYPECTOMY;  Surgeon: Carol Ada, MD;  [facility]: [facility];  Service: Endoscopy;;   UMBILICAL HERNIA REPAIR     WISDOM TOOTH EXTRACTION     Patient Active Problem List   Diagnosis Date Noted   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   Uterine leiomyoma 10/08/2020   Cervical high risk HPV (human papillomavirus) test positive 07/31/2019   Pain in right hand 01/19/2019   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   Fibroids 09/30/2015   Hypertension 05/10/2012   Morbid obesity ([place])    Umbilical hernia 36/64/4034   Abnormal cervical Papanicolaou smear 05/13/2002   Low grade squamous intraepithelial lesion (LGSIL) on cervicovaginal cytologic smear 06/10/2000    THERAPY DIAG:  Acute pain of left knee  Acute pain of right knee  Localized edema  Difficulty in walking, not elsewhere classified  Muscle weakness (generalized)  REFERRING DIAG: M25.561,M25.562,G89.29 (ICD-10-CM) - Chronic pain of both knees  PERTINENT HISTORY: Kidner procedure posterior tibial tendon repair left on 10/09/2021, HTN  PRECAUTIONS/RESTRICTIONS:   none  SUBJECTIVE:  Pt reports that she had a cortisone injection in her L knee last week which significantly helped her pain  Are you having pain? Yes: NPRS scale: 1/10 Pain location: BIL knees Pain description: burning, dull ache Aggravating factors: walking following sitting, standing/  walking >1 hour, turning in bed, descending stairs at her apartment Relieving factors: ice, elevation, and ibuprofen  OBJECTIVE: (objective measures completed at initial evaluation unless otherwise dated)  DIAGNOSTIC FINDINGS: 08/12/2021: MR Ankle Left WO Contrast: IMPRESSION: 1. Mild posterior tibial tenosynovitis and distal insertional tendinosis. Thin linear partial-thickness tear involving the medial 50% of the distal tendon insertion on the navicular tuberosity. The navicular tuberosity is large  suggesting a type 3 os naviculare (cornuate navicular) normal variant. 2. Partial-thickness longitudinal split tear of the peroneus brevis tendon. 3. Remote partial-thickness tear and sprain of the anterior talofibular ligament. 4. 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 5. 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   10/15/2021: DG Lt Foot: resection of the hypertrophic tuberosity of the navicular and a intact anchor is placed.  Assessment: Well-healing surgical foot   PATIENT SURVEYS:  FOTO 82%, predicted 85% in 8 visits   COGNITION:           Overall cognitive status: Within functional limits for tasks assessed                          SENSATION: Not tested   EDEMA:  Circumferential: At tibial plateau: Rt: 45cm, Lt: 44cm    MUSCLE LENGTH: Hamstrings: WNL BIL     POSTURE:  BIL genu valgum in stance   PALPATION: No TTP, BIL patella alta    PASSIVE ACCESSORIES: Hypomobile Lt patellar mobility in all planes   LOWER EXTREMITY ROM:   A/PROM Right eval Left eval  Knee flexion 108/118 100/110p!  Knee extension 0/2 0/2   (Blank rows = not tested)   LOWER EXTREMITY MMT:   MMT Right eval Left eval  Hip flexion 3+/5 3+/5  Hip extension 3+/5 3+/5  Hip abduction 3/5 3/5  Knee flexion 5/5 4+/5  Knee extension 5/5 5/5  Ankle dorsiflexion 5/5 5/5  Ankle plantarflexion 5/5 5/5  Ankle inversion 5/5 5/5  Ankle eversion 5/5 5/5   (Blank rows = not tested)   SPECIAL TESTS:  Patellar apprehension: (+) BIL Eccentric step test: (+) on Lt for decreased eccentric control and pain Patellar compression: (+) BIL Lateral pull sign: (+) BIL Thessaly at 0/20 degrees of knee flexion: (-) BIL McMurray's: (-) BIL Apley's: (-) BIL   FUNCTIONAL TESTS:  5xSTS: 15.4 seconds Squat: 75%, no pain DL heel raise x25: WNL     GAIT: Distance walked: 20 ft Assistive device utilized: None Level of assistance:  Modified independence due to use of Lt ankle brace Comments: Lt antalgic gait pattern with decreased Lt step length and slow gait speed       TODAY'S TREATMENT: 12/30/2021: Demonstrated and issued HEP     PATIENT EDUCATION:  Education details: Pt educated on probable underlying pathophysiology behind pain presentation, POC, prognosis, FOTO, and HEP Person educated: Patient Education method: Explanation, Demonstration, and Handouts Education comprehension: verbalized understanding and returned demonstration     HOME EXERCISE PROGRAM: Access Code: [email or web address] URL: [email or web address] Date: 12/30/2021 Prepared by: Vanessa [place]   Exercises - Squat  - 1 x daily - 7 x weekly - 3 sets - 15 reps - Sidelying Hip Abduction  - 1 x daily - 7 x weekly - 2 sets - 10 reps - 5 seconds hold - Forward Step Down Touch with Heel  - 1 x daily - 7 x weekly - 3 sets - 10 reps - Supine 90/90 Abdominal Bracing  -  1 x daily - 7 x weekly - 4 sets - 30 seconds hold   TREATMENT 10/2:  Therapeutic Exercise: - nu-step L6 8mwhile taking subjective and planning session with patient - slant board stretch - 2' - fwd and lat step up - 4'' box - 2x10 - heel raise - 3x15 - knee ext machine - 4x10 '@15'$ # - hack squat machine - 60# - 3x10  Neuromuscular re-ed: Semi tandem (100%) on foam - 419' bouts Rocker board DF/PF - blue   ASSESSMENT:   CLINICAL IMPRESSION: [place] tolerated session well with no adverse reaction.  She shows clear weakness in L quad vs R.  She is significantly challenged by all exercises with fatigue but no increase in pain.     OBJECTIVE IMPAIRMENTS Abnormal gait, decreased activity tolerance, decreased balance, decreased endurance, decreased mobility, difficulty walking, decreased ROM, decreased strength, hypomobility, increased edema, impaired flexibility, improper body mechanics, postural dysfunction, and pain.    ACTIVITY LIMITATIONS carrying, lifting, bending,  sitting, standing, squatting, sleeping, stairs, transfers, and locomotion level   PARTICIPATION LIMITATIONS: cleaning, laundry, driving, community activity, occupation, and yard work   PERSONAL FACTORS  N/A  are also affecting patient's functional outcome.    REHAB POTENTIAL: Excellent   CLINICAL DECISION MAKING: Stable/uncomplicated   EVALUATION COMPLEXITY: Low     GOALS: Goals reviewed with patient? Yes   SHORT TERM GOALS: Target date: 01/27/2022  Pt will report understanding and adherence to initial HEP in order to promote independence in the management of primary impairments. Baseline: HEP provided at eval Goal status: INITIAL       LONG TERM GOALS: Target date: 02/24/2022    Pt will achieve a FOTO score of 85% in order to demonstrate improved functional ability as it relates to the pt's primary impairments. Baseline: 82% Goal status: INITIAL   2.  Pt will achieve a 5xSTS in <12 seconds in order to demonstrate improved functional ability with community transfers. Baseline: 15.4 seconds Goal status: INITIAL   3.  Pt will achieve BIL global hip strength of 4+/5 in order to progress her independent LE strengthening regimen with less limitation. Baseline: See MMT chart Goal status: INITIAL   4.  Pt will report ability to stand/ walk >2 hours with 0-2/10 pain in order to complete work duties with less limitation. Baseline: >6/10 pain with 1 hour of standing/ walking Goal status: INITIAL   5.  Pt will demonstrate ability to ascend/ descend 20 stairs with 0-2/10 pain in order to enter and exit her apartment with less limitation. Baseline: >6/10 pain with single eccentric step-down in clinic Goal status: INITIAL       PLAN: PT FREQUENCY: 1x/week   PT DURATION: 8 weeks   PLANNED INTERVENTIONS: Therapeutic exercises, Therapeutic activity, Neuromuscular re-education, Balance training, Gait training, Patient/Family education, Self Care, Joint mobilization, Joint  manipulation, Stair training, Aquatic Therapy, Dry Needling, Electrical stimulation, Cryotherapy, Moist heat, Taping, Vasopneumatic device, Biofeedback, Ionotophoresis '4mg'$ /ml Dexamethasone, Manual therapy, and Re-evaluation   PLAN FOR NEXT SESSION: Progress quad reinforcement, hip strengthening, knee alignment   KKevan NyReinhartsen PT 01/11/2022, 2:41 PM

## 2022-01-13 ENCOUNTER — Other Ambulatory Visit: Payer: Self-pay | Admitting: Obstetrics & Gynecology

## 2022-01-13 ENCOUNTER — Ambulatory Visit (INDEPENDENT_AMBULATORY_CARE_PROVIDER_SITE_OTHER): Payer: No Typology Code available for payment source | Admitting: Podiatry

## 2022-01-13 DIAGNOSIS — S86112D Strain of other muscle(s) and tendon(s) of posterior muscle group at lower leg level, left leg, subsequent encounter: Secondary | ICD-10-CM

## 2022-01-13 DIAGNOSIS — Z9889 Other specified postprocedural states: Secondary | ICD-10-CM | POA: Diagnosis not present

## 2022-01-13 DIAGNOSIS — Z1231 Encounter for screening mammogram for malignant neoplasm of breast: Secondary | ICD-10-CM

## 2022-01-13 NOTE — Progress Notes (Signed)
Patient presents today to be casted for custom molded orthotics. Dr. Milinda Pointer has been treating patient for   Traumatic rupture of left posterior tibial tendon  .   Impression foam cast was taken. ABN signed.  Patient info-  Shoe size: 9  Shoe style: athletic  Height: 5'  Weight: 290  Insurance: [employer]    Patient will be notified once orthotics arrive in office and reappoint for fitting at that time.

## 2022-01-18 ENCOUNTER — Ambulatory Visit: Payer: No Typology Code available for payment source | Admitting: Physical Therapy

## 2022-01-18 ENCOUNTER — Ambulatory Visit (INDEPENDENT_AMBULATORY_CARE_PROVIDER_SITE_OTHER): Payer: No Typology Code available for payment source | Admitting: Internal Medicine

## 2022-01-18 ENCOUNTER — Encounter: Payer: Self-pay | Admitting: Internal Medicine

## 2022-01-18 ENCOUNTER — Encounter: Payer: No Typology Code available for payment source | Admitting: Physical Therapy

## 2022-01-18 VITALS — BP 128/82 | HR 75 | Temp 97.9°F | Ht 65.0 in | Wt 291.8 lb

## 2022-01-18 DIAGNOSIS — E66813 Obesity, class 3: Secondary | ICD-10-CM

## 2022-01-18 DIAGNOSIS — I10 Essential (primary) hypertension: Secondary | ICD-10-CM

## 2022-01-18 DIAGNOSIS — Z6841 Body Mass Index (BMI) 40.0 and over, adult: Secondary | ICD-10-CM

## 2022-01-18 DIAGNOSIS — Z23 Encounter for immunization: Secondary | ICD-10-CM

## 2022-01-18 NOTE — Progress Notes (Signed)
Barnet Glasgow Martin,acting as a Education administrator for Maximino Greenland, MD.,have documented all relevant documentation on the behalf of Maximino Greenland, MD,as directed by  Maximino Greenland, MD while in the presence of Maximino Greenland, MD.    Subjective:     Patient ID: Amanda Paul , female    DOB: [birth year] , 46 y.o.   MRN: [id number]   Chief Complaint  Patient presents with   Hypertension    HPI  Patient is here for blood pressure. Patient states compliance with medication and has no other issues. Patient got covid shot last week and wants to know if it is okay to get flu shot today.   Hypertension This is a chronic problem. The current episode started more than 1 year ago. The problem has been gradually improving since onset. The problem is controlled. Pertinent negatives include no blurred vision or palpitations. Risk factors for coronary artery disease include dyslipidemia and obesity. Past treatments include calcium channel blockers.     Past Medical History:  Diagnosis Date   Allergy    Arthritis    Hernia, umbilical    Morbid obesity ([place])      Family History  Problem Relation Age of Onset   Hypertension Mother      Current Outpatient Medications:    acetaminophen (TYLENOL) 325 MG tablet, Take 650 mg by mouth every 6 (six) hours as needed for moderate pain., Disp: , Rfl:    amLODipine (NORVASC) 10 MG tablet, Take 1 tablet (10 mg total) by mouth daily., Disp: 90 tablet, Rfl: 3   Calcium Carb-Cholecalciferol (CALCIUM 500 +D PO), Take 1 tablet by mouth daily., Disp: , Rfl:    CINNAMON PO, Take 1 capsule by mouth daily., Disp: , Rfl:    clindamycin (CLEOCIN) 150 MG capsule, Take 1 capsule (150 mg total) by mouth 3 (three) times daily., Disp: 30 capsule, Rfl: 0   ibuprofen (ADVIL) 600 MG tablet, Take 1 tablet (600 mg total) by mouth every 8 (eight) hours as needed., Disp: 90 tablet, Rfl: 1   ipratropium (ATROVENT) 0.03 % nasal spray, Place 2 sprays into both nostrils 3  (three) times daily., Disp: , Rfl:    magnesium oxide (MAG-OX) 400 MG tablet, Take 400 mg by mouth daily., Disp: , Rfl:    Multiple Vitamin (MULTIVITAMIN) capsule, Take 1 capsule by mouth daily., Disp: , Rfl:    ondansetron (ZOFRAN) 4 MG tablet, Take 1 tablet (4 mg total) by mouth every 8 (eight) hours as needed., Disp: 20 tablet, Rfl: 0   rosuvastatin (CRESTOR) 10 MG tablet, Take 1 tablet (10 mg total) by mouth daily., Disp: 90 tablet, Rfl: 3   Vitamin D, Ergocalciferol, (DRISDOL) 1.25 MG (50000 UNIT) CAPS capsule, Take 1 capsule (50,000 Units total) by mouth every 7 (seven) days., Disp: 12 capsule, Rfl: 1   Allergies  Allergen Reactions   Amoxicillin Hives, Itching and Swelling    Swollen eyes and throat.   Penicillins Hives and Swelling     Review of Systems  Constitutional: Negative.   HENT: Negative.    Eyes: Negative.  Negative for blurred vision.  Respiratory: Negative.    Cardiovascular: Negative.  Negative for palpitations.  Gastrointestinal: Negative.      Today's Vitals   01/18/22 0927  BP: 128/82  Pulse: 75  Temp: 97.9 F (36.6 C)  TempSrc: Oral  Weight: 291 lb 12.8 oz (132.4 kg)  Height: 5' 5"  (1.651 m)  PainSc: 0-No pain   Body mass index is  48.56 kg/m.  Wt Readings from Last 3 Encounters:  01/18/22 291 lb 12.8 oz (132.4 kg)  09/09/21 298 lb (135.2 kg)  06/24/21 296 lb (134.3 kg)      Objective:  Physical Exam Vitals and nursing note reviewed.  Constitutional:      Appearance: Normal appearance. She is obese.  HENT:     Head: Normocephalic and atraumatic.  Eyes:     Extraocular Movements: Extraocular movements intact.  Cardiovascular:     Rate and Rhythm: Normal rate and regular rhythm.     Heart sounds: Normal heart sounds.  Pulmonary:     Effort: Pulmonary effort is normal.     Breath sounds: Normal breath sounds.  Musculoskeletal:     Cervical back: Normal range of motion.  Skin:    General: Skin is warm.  Neurological:     General: No  focal deficit present.     Mental Status: She is alert.  Psychiatric:        Mood and Affect: Mood normal.        Behavior: Behavior normal.      Assessment And Plan:     1. Primary hypertension Comments: Chronic, controlled. She will c/w amlodipine 23m daily. She agrees to f/u in 4 months.  - BMP8+eGFR  2. Class 3 severe obesity due to excess calories with serious comorbidity and body mass index (BMI) of 45.0 to 49.9 in adult ([facility] Comments: BMI 48. She was congratulated on losing 7 lbs since May 2023. She is encouraged to aim to lose ten pounds over the next several months.   3. Need for influenza vaccination - Flu Vaccine QUAD 6+ mos PF IM (Fluarix Quad PF)   Patient was given opportunity to ask questions. Patient verbalized understanding of the plan and was able to repeat key elements of the plan. All questions were answered to their satisfaction.   I, RMaximino Greenland MD, have reviewed all documentation for this visit. The documentation on 01/18/22 for the exam, diagnosis, procedures, and orders are all accurate and complete.   IF YOU HAVE BEEN REFERRED TO A SPECIALIST, IT MAY TAKE 1-2 WEEKS TO SCHEDULE/PROCESS THE REFERRAL. IF YOU HAVE NOT HEARD FROM US/SPECIALIST IN TWO WEEKS, PLEASE GIVE [place] CALL AT [phone].   THE PATIENT IS ENCOURAGED TO PRACTICE SOCIAL DISTANCING DUE TO THE COVID-19 PANDEMIC.

## 2022-01-18 NOTE — Patient Instructions (Signed)
Hypertension, Adult ?Hypertension is another name for high blood pressure. High blood pressure forces your heart to work harder to pump blood. This can cause problems over time. ?There are two numbers in a blood pressure reading. There is a top number (systolic) over a bottom number (diastolic). It is best to have a blood pressure that is below 120/80. ?What are the causes? ?The cause of this condition is not known. Some other conditions can lead to high blood pressure. ?What increases the risk? ?Some lifestyle factors can make you more likely to develop high blood pressure: ?Smoking. ?Not getting enough exercise or physical activity. ?Being overweight. ?Having too much fat, sugar, calories, or salt (sodium) in your diet. ?Drinking too much alcohol. ?Other risk factors include: ?Having any of these conditions: ?Heart disease. ?Diabetes. ?High cholesterol. ?Kidney disease. ?Obstructive sleep apnea. ?Having a family history of high blood pressure and high cholesterol. ?Age. The risk increases with age. ?Stress. ?What are the signs or symptoms? ?High blood pressure may not cause symptoms. Very high blood pressure (hypertensive crisis) may cause: ?Headache. ?Fast or uneven heartbeats (palpitations). ?Shortness of breath. ?Nosebleed. ?Vomiting or feeling like you may vomit (nauseous). ?Changes in how you see. ?Very bad chest pain. ?Feeling dizzy. ?Seizures. ?How is this treated? ?This condition is treated by making healthy lifestyle changes, such as: ?Eating healthy foods. ?Exercising more. ?Drinking less alcohol. ?Your doctor may prescribe medicine if lifestyle changes do not help enough and if: ?Your top number is above 130. ?Your bottom number is above 80. ?Your personal target blood pressure may vary. ?Follow these instructions at home: ?Eating and drinking ? ?If told, follow the DASH eating plan. To follow this plan: ?Fill one half of your plate at each meal with fruits and vegetables. ?Fill one fourth of your plate  at each meal with whole grains. Whole grains include whole-wheat pasta, brown rice, and whole-grain bread. ?Eat or drink low-fat dairy products, such as skim milk or low-fat yogurt. ?Fill one fourth of your plate at each meal with low-fat (lean) proteins. Low-fat proteins include fish, chicken without skin, eggs, beans, and tofu. ?Avoid fatty meat, cured and processed meat, or chicken with skin. ?Avoid pre-made or processed food. ?Limit the amount of salt in your diet to less than 1,500 mg each day. ?Do not drink alcohol if: ?Your doctor tells you not to drink. ?You are pregnant, may be pregnant, or are planning to become pregnant. ?If you drink alcohol: ?Limit how much you have to: ?0-1 drink a day for women. ?0-2 drinks a day for men. ?Know how much alcohol is in your drink. In the [place] one drink equals one 12 oz bottle of beer (355 mL), one 5 oz glass of wine (148 mL), or one 1? oz glass of hard liquor (44 mL). ?Lifestyle ? ?Work with your doctor to stay at a healthy weight or to lose weight. Ask your doctor what the best weight is for you. ?Get at least 30 minutes of exercise that causes your heart to beat faster (aerobic exercise) most days of the week. This may include walking, swimming, or biking. ?Get at least 30 minutes of exercise that strengthens your muscles (resistance exercise) at least 3 days a week. This may include lifting weights or doing Pilates. ?Do not smoke or use any products that contain nicotine or tobacco. If you need help quitting, ask your doctor. ?Check your blood pressure at home as told by your doctor. ?Keep all follow-up visits. ?Medicines ?Take over-the-counter and prescription medicines   only as told by your doctor. Follow directions carefully. ?Do not skip doses of blood pressure medicine. The medicine does not work as well if you skip doses. Skipping doses also puts you at risk for problems. ?Ask your doctor about side effects or reactions to medicines that you should watch  for. ?Contact a doctor if: ?You think you are having a reaction to the medicine you are taking. ?You have headaches that keep coming back. ?You feel dizzy. ?You have swelling in your ankles. ?You have trouble with your vision. ?Get help right away if: ?You get a very bad headache. ?You start to feel mixed up (confused). ?You feel weak or numb. ?You feel faint. ?You have very bad pain in your: ?Chest. ?Belly (abdomen). ?You vomit more than once. ?You have trouble breathing. ?These symptoms may be an emergency. Get help right away. Call 911. ?Do not wait to see if the symptoms will go away. ?Do not drive yourself to the hospital. ?Summary ?Hypertension is another name for high blood pressure. ?High blood pressure forces your heart to work harder to pump blood. ?For most people, a normal blood pressure is less than 120/80. ?Making healthy choices can help lower blood pressure. If your blood pressure does not get lower with healthy choices, you may need to take medicine. ?This information is not intended to replace advice given to you by your health care provider. Make sure you discuss any questions you have with your health care provider. ?Document Revised: 01/15/2021 Document Reviewed: 01/15/2021 ?Elsevier Patient Education ? 2023 Elsevier Inc. ? ?

## 2022-01-19 LAB — BMP8+EGFR
BUN/Creatinine Ratio: 27 — ABNORMAL HIGH (ref 9–23)
BUN: 14 mg/dL (ref 6–24)
CO2: 21 mmol/L (ref 20–29)
Calcium: 9.6 mg/dL (ref 8.7–10.2)
Chloride: 100 mmol/L (ref 96–106)
Creatinine, Ser: 0.52 mg/dL — ABNORMAL LOW (ref 0.57–1.00)
Glucose: 77 mg/dL (ref 70–99)
Potassium: 4.5 mmol/L (ref 3.5–5.2)
Sodium: 139 mmol/L (ref 134–144)
eGFR: 115 mL/min/{1.73_m2} (ref >59)

## 2022-01-27 ENCOUNTER — Ambulatory Visit: Payer: No Typology Code available for payment source

## 2022-01-27 DIAGNOSIS — M25562 Pain in left knee: Secondary | ICD-10-CM

## 2022-01-27 DIAGNOSIS — M6281 Muscle weakness (generalized): Secondary | ICD-10-CM

## 2022-01-27 DIAGNOSIS — M25561 Pain in right knee: Secondary | ICD-10-CM

## 2022-01-27 DIAGNOSIS — R262 Difficulty in walking, not elsewhere classified: Secondary | ICD-10-CM

## 2022-01-27 DIAGNOSIS — R600 Localized edema: Secondary | ICD-10-CM

## 2022-01-27 NOTE — Therapy (Signed)
OUTPATIENT PHYSICAL THERAPY TREATMENT NOTE   Patient Name: Amanda Paul MRN: [id number] DOB:1976-02-05, 46 y.o., female Today's Date: 01/27/2022  PCP: Glendale Chard, MD   REFERRING PROVIDER: Garrel Ridgel, DPM   PT End of Session - 01/27/22 1058     Visit Number 3    Number of Visits 8    Date for PT Re-Evaluation 03/03/22    Authorization Type [employer]    Authorization Time Period FOTO v6, v10    PT Start Time 1058   Pt arrived 13 minutes late to her appointment.   PT Stop Time 1126    PT Time Calculation (min) 28 min    Activity Tolerance Patient tolerated treatment well    Behavior During Therapy WFL for tasks assessed/performed              Past Medical History:  Diagnosis Date   Allergy    Arthritis    Hernia, umbilical    Morbid obesity ([place])    Past Surgical History:  Procedure Laterality Date   ADVANCEMENT / RECONSTRUCTION POSTERIOR TIBIAL TENDON / Vincente Liberty Left 10/09/2021   COLONOSCOPY WITH PROPOFOL N/A 06/13/2020   Procedure: COLONOSCOPY WITH PROPOFOL;  Surgeon: Carol Ada, MD;  Location: [facility];  Service: Endoscopy;  Laterality: N/A;   HEMOSTASIS CLIP PLACEMENT  06/13/2020   Procedure: HEMOSTASIS CLIP PLACEMENT;  Surgeon: Carol Ada, MD;  Location: [facility];  Service: Endoscopy;;   HERNIA REPAIR     HYSTEROSCOPY WITH D & C N/A 08/26/2014   Procedure: DILATATION AND CURETTAGE /HYSTEROSCOPY and cervical repair of cervical laceration ;  Surgeon: Delsa Bern, MD;  Location: [facility];  Service: Gynecology;  Laterality: N/A;   IR ANGIOGRAM PELVIS SELECTIVE OR SUPRASELECTIVE  10/08/2020   IR ANGIOGRAM PELVIS SELECTIVE OR SUPRASELECTIVE  10/08/2020   IR ANGIOGRAM SELECTIVE EACH ADDITIONAL VESSEL  10/08/2020   IR EMBO TUMOR ORGAN ISCHEMIA INFARCT INC GUIDE ROADMAPPING  10/08/2020   IR RADIOLOGIST EVAL & MGMT  07/10/2020   IR RADIOLOGIST EVAL & MGMT  07/31/2020   IR RADIOLOGIST EVAL & MGMT  09/09/2020   IR RADIOLOGIST EVAL & MGMT   11/18/2020   IR RADIOLOGIST EVAL & MGMT  09/14/2021   IR US GUIDE VASC ACCESS RIGHT  10/08/2020   POLYPECTOMY  06/13/2020   Procedure: POLYPECTOMY;  Surgeon: Carol Ada, MD;  Location: [facility];  Service: Endoscopy;;   UMBILICAL HERNIA REPAIR     WISDOM TOOTH EXTRACTION     Patient Active Problem List   Diagnosis Date Noted   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   Uterine leiomyoma 10/08/2020   Cervical high risk HPV (human papillomavirus) test positive 07/31/2019   Pain in right hand 01/19/2019   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   Fibroids 09/30/2015   Hypertension 05/10/2012   Morbid obesity ([place])    Umbilical hernia 27/09/2374   Abnormal cervical Papanicolaou smear 05/13/2002   Low grade squamous intraepithelial lesion (LGSIL) on cervicovaginal cytologic smear 06/10/2000    THERAPY DIAG:  Acute pain of left knee  Acute pain of right knee  Localized edema  Difficulty in walking, not elsewhere classified  Muscle weakness (generalized)  REFERRING DIAG: M25.561,M25.562,G89.29 (ICD-10-CM) - Chronic pain of both knees  PERTINENT HISTORY: Kidner procedure posterior tibial tendon repair left on 10/09/2021, HTN  PRECAUTIONS/RESTRICTIONS:   none  SUBJECTIVE:  Pt reports her knee pain is getting better. She also reports varied HEP adherence.   Are you having pain? Yes: NPRS scale: 0/10 Pain location: BIL knees Pain description: burning, dull ache  Aggravating factors: walking following sitting, standing/ walking >1 hour, turning in bed, descending stairs at her apartment Relieving factors: ice, elevation, and ibuprofen  OBJECTIVE: (objective measures completed at initial evaluation unless otherwise dated)  DIAGNOSTIC FINDINGS: 08/12/2021: MR Ankle Left WO Contrast: IMPRESSION: 1. Mild posterior tibial tenosynovitis and distal insertional tendinosis. Thin linear partial-thickness tear involving the medial 50% of the distal tendon insertion on the navicular tuberosity.  The navicular tuberosity is large suggesting a type 3 os naviculare (cornuate navicular) normal variant. 2. Partial-thickness longitudinal split tear of the peroneus brevis tendon. 3. Remote partial-thickness tear and sprain of the anterior talofibular ligament. 4. 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 5. 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   10/15/2021: DG Lt Foot: resection of the hypertrophic tuberosity of the navicular and a intact anchor is placed.  Assessment: Well-healing surgical foot   PATIENT SURVEYS:  FOTO 82%, predicted 85% in 8 visits   COGNITION:           Overall cognitive status: Within functional limits for tasks assessed                          SENSATION: Not tested   EDEMA:  Circumferential: At tibial plateau: Rt: 45cm, Lt: 44cm    MUSCLE LENGTH: Hamstrings: WNL BIL     POSTURE:  BIL genu valgum in stance   PALPATION: No TTP, BIL patella alta    PASSIVE ACCESSORIES: Hypomobile Lt patellar mobility in all planes   LOWER EXTREMITY ROM:   A/PROM Right eval Left eval  Knee flexion 108/118 100/110p!  Knee extension 0/2 0/2   (Blank rows = not tested)   LOWER EXTREMITY MMT:   MMT Right eval Left eval  Hip flexion 3+/5 3+/5  Hip extension 3+/5 3+/5  Hip abduction 3/5 3/5  Knee flexion 5/5 4+/5  Knee extension 5/5 5/5  Ankle dorsiflexion 5/5 5/5  Ankle plantarflexion 5/5 5/5  Ankle inversion 5/5 5/5  Ankle eversion 5/5 5/5   (Blank rows = not tested)   SPECIAL TESTS:  Patellar apprehension: (+) BIL Eccentric step test: (+) on Lt for decreased eccentric control and pain Patellar compression: (+) BIL Lateral pull sign: (+) BIL Thessaly at 0/20 degrees of knee flexion: (-) BIL McMurray's: (-) BIL Apley's: (-) BIL   FUNCTIONAL TESTS:  5xSTS: 15.4 seconds Squat: 75%, no pain DL heel raise x25: WNL     GAIT: Distance walked: 20 ft Assistive device  utilized: None Level of assistance: Modified independence due to use of Lt ankle brace Comments: Lt antalgic gait pattern with decreased Lt step length and slow gait speed       TODAY'S TREATMENT:  [facility] Adult PT Treatment:                                                DATE: 01/27/2022 Therapeutic Exercise: 2-inch lateral heel taps with UE support 2x10 BIL Seated LAQ with YTB 2x10 with 3-sec hold BIL Seated BIL active hamstring stretch x63mn Sidelying hip abduction with YTB around ankles 2x10 BIL Mini squat into heel raise with overhead reach 2x10 Manual Therapy: N/A Neuromuscular re-ed: N/A Therapeutic Activity: N/A Modalities: N/A Self Care: N/A    TREATMENT 10/2:  Therapeutic Exercise: - nu-step L6 51mhile taking subjective and  planning session with patient - slant board stretch - 2' - fwd and lat step up - 4'' box - 2x10 - heel raise - 3x15 - knee ext machine - 4x10 '@15'$ # - hack squat machine - 60# - 3x10  Neuromuscular re-ed: Semi tandem (100%) on foam - 30'' bouts Rocker board DF/PF - blue     PATIENT EDUCATION:  Education details: Pt educated on probable underlying pathophysiology behind pain presentation, POC, prognosis, FOTO, and HEP Person educated: Patient Education method: Explanation, Demonstration, and Handouts Education comprehension: verbalized understanding and returned demonstration     HOME EXERCISE PROGRAM: Access Code: [email or web address] URL: [email or web address] Date: 12/30/2021 Prepared by: Vanessa Vails Gate   Exercises - Squat  - 1 x daily - 7 x weekly - 3 sets - 15 reps - Sidelying Hip Abduction  - 1 x daily - 7 x weekly - 2 sets - 10 reps - 5 seconds hold - Forward Step Down Touch with Heel  - 1 x daily - 7 x weekly - 3 sets - 10 reps - Supine 90/90 Abdominal Bracing  - 1 x daily - 7 x weekly - 4 sets - 30 seconds hold     ASSESSMENT:   CLINICAL IMPRESSION: Due to pt arriving 13 minutes late to her appointment, the  session was truncated today. She responded well to all interventions today, demonstrating good form and no pain throughout the treatment. She brings in her own elastic bands and asks for the therapist for exercises that utilize her bands. The pt will continue to benefit from skilled PT to address her primary impairments and return to her prior level of function with less limitation.      OBJECTIVE IMPAIRMENTS Abnormal gait, decreased activity tolerance, decreased balance, decreased endurance, decreased mobility, difficulty walking, decreased ROM, decreased strength, hypomobility, increased edema, impaired flexibility, improper body mechanics, postural dysfunction, and pain.    ACTIVITY LIMITATIONS carrying, lifting, bending, sitting, standing, squatting, sleeping, stairs, transfers, and locomotion level   PARTICIPATION LIMITATIONS: cleaning, laundry, driving, community activity, occupation, and yard work   PERSONAL FACTORS  N/A  are also affecting patient's functional outcome.        GOALS: Goals reviewed with patient? Yes   SHORT TERM GOALS: Target date: 01/27/2022  Pt will report understanding and adherence to initial HEP in order to promote independence in the management of primary impairments. Baseline: HEP provided at eval Goal status: INITIAL       LONG TERM GOALS: Target date: 02/24/2022    Pt will achieve a FOTO score of 85% in order to demonstrate improved functional ability as it relates to the pt's primary impairments. Baseline: 82% Goal status: INITIAL   2.  Pt will achieve a 5xSTS in <12 seconds in order to demonstrate improved functional ability with community transfers. Baseline: 15.4 seconds Goal status: INITIAL   3.  Pt will achieve BIL global hip strength of 4+/5 in order to progress her independent LE strengthening regimen with less limitation. Baseline: See MMT chart Goal status: INITIAL   4.  Pt will report ability to stand/ walk >2 hours with 0-2/10 pain in  order to complete work duties with less limitation. Baseline: >6/10 pain with 1 hour of standing/ walking Goal status: INITIAL   5.  Pt will demonstrate ability to ascend/ descend 20 stairs with 0-2/10 pain in order to enter and exit her apartment with less limitation. Baseline: >6/10 pain with single eccentric step-down in clinic Goal status: INITIAL       PLAN:  PT FREQUENCY: 1x/week   PT DURATION: 8 weeks   PLANNED INTERVENTIONS: Therapeutic exercises, Therapeutic activity, Neuromuscular re-education, Balance training, Gait training, Patient/Family education, Self Care, Joint mobilization, Joint manipulation, Stair training, Aquatic Therapy, Dry Needling, Electrical stimulation, Cryotherapy, Moist heat, Taping, Vasopneumatic device, Biofeedback, Ionotophoresis '4mg'$ /ml Dexamethasone, Manual therapy, and Re-evaluation   PLAN FOR NEXT SESSION: Progress quad reinforcement, hip strengthening, knee alignment   Vanessa Gaston, PT, DPT 01/27/22 11:27 AM

## 2022-02-01 ENCOUNTER — Ambulatory Visit: Payer: No Typology Code available for payment source | Admitting: Podiatry

## 2022-02-03 ENCOUNTER — Encounter: Payer: Self-pay | Admitting: Physical Therapy

## 2022-02-03 ENCOUNTER — Ambulatory Visit: Payer: No Typology Code available for payment source | Admitting: Physical Therapy

## 2022-02-03 DIAGNOSIS — R600 Localized edema: Secondary | ICD-10-CM

## 2022-02-03 DIAGNOSIS — M6281 Muscle weakness (generalized): Secondary | ICD-10-CM

## 2022-02-03 DIAGNOSIS — R262 Difficulty in walking, not elsewhere classified: Secondary | ICD-10-CM

## 2022-02-03 DIAGNOSIS — M25561 Pain in right knee: Secondary | ICD-10-CM

## 2022-02-03 DIAGNOSIS — M25562 Pain in left knee: Secondary | ICD-10-CM | POA: Diagnosis not present

## 2022-02-03 NOTE — Therapy (Signed)
OUTPATIENT PHYSICAL THERAPY TREATMENT NOTE   Patient Name: Amanda Paul MRN: [id number] DOB:Jun 26, 1975, 46 y.o., female Today's Date: 02/03/2022  PCP: Glendale Chard, MD   REFERRING PROVIDER: Garrel Ridgel, DPM   PT End of Session - 02/03/22 1129     Visit Number 4    Number of Visits 8    Date for PT Re-Evaluation 03/03/22    Authorization Type [employer]    Authorization Time Period FOTO v6, v10    PT Start Time 1130    PT Stop Time 1211    PT Time Calculation (min) 41 min    Activity Tolerance Patient tolerated treatment well    Behavior During Therapy WFL for tasks assessed/performed              Past Medical History:  Diagnosis Date   Allergy    Arthritis    Hernia, umbilical    Morbid obesity ([place])    Past Surgical History:  Procedure Laterality Date   ADVANCEMENT / [place] / Vincente Liberty Left 10/09/2021   COLONOSCOPY WITH PROPOFOL N/A 06/13/2020   Procedure: COLONOSCOPY WITH PROPOFOL;  Surgeon: Carol Ada, MD;  Location: [facility];  Service: Endoscopy;  Laterality: N/A;   HEMOSTASIS CLIP PLACEMENT  06/13/2020   Procedure: HEMOSTASIS CLIP PLACEMENT;  Surgeon: Carol Ada, MD;  Location: [facility];  Service: Endoscopy;;   HERNIA REPAIR     HYSTEROSCOPY WITH D & C N/A 08/26/2014   Procedure: DILATATION AND CURETTAGE /HYSTEROSCOPY and cervical repair of cervical laceration ;  Surgeon: Delsa Bern, MD;  Location: [facility];  Service: Gynecology;  Laterality: N/A;   IR ANGIOGRAM PELVIS SELECTIVE OR SUPRASELECTIVE  10/08/2020   IR ANGIOGRAM PELVIS SELECTIVE OR SUPRASELECTIVE  10/08/2020   IR ANGIOGRAM SELECTIVE EACH ADDITIONAL VESSEL  10/08/2020   IR EMBO TUMOR ORGAN ISCHEMIA INFARCT INC GUIDE ROADMAPPING  10/08/2020   IR RADIOLOGIST EVAL & MGMT  07/10/2020   IR RADIOLOGIST EVAL & MGMT  07/31/2020   IR RADIOLOGIST EVAL & MGMT  09/09/2020   IR RADIOLOGIST EVAL & MGMT  11/18/2020   IR RADIOLOGIST EVAL & MGMT  09/14/2021    IR US GUIDE VASC ACCESS RIGHT  10/08/2020   POLYPECTOMY  06/13/2020   Procedure: POLYPECTOMY;  Surgeon: Carol Ada, MD;  Location: [facility];  Service: Endoscopy;;   UMBILICAL HERNIA REPAIR     WISDOM TOOTH EXTRACTION     Patient Active Problem List   Diagnosis Date Noted   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   Uterine leiomyoma 10/08/2020   Cervical high risk HPV (human papillomavirus) test positive 07/31/2019   Pain in right hand 01/19/2019   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   Fibroids 09/30/2015   Hypertension 05/10/2012   Morbid obesity ([place])    Umbilical hernia 56/21/3086   Abnormal cervical Papanicolaou smear 05/13/2002   Low grade squamous intraepithelial lesion (LGSIL) on cervicovaginal cytologic smear 06/10/2000    THERAPY DIAG:  Acute pain of left knee  Acute pain of right knee  Localized edema  Difficulty in walking, not elsewhere classified  Muscle weakness (generalized)  REFERRING DIAG: M25.561,M25.562,G89.29 (ICD-10-CM) - Chronic pain of both knees  PERTINENT HISTORY: Kidner procedure posterior tibial tendon repair left on 10/09/2021, HTN  PRECAUTIONS/RESTRICTIONS:   none  SUBJECTIVE:  Pt reports that she enjoyed the band work last visit.  She feels the machines 2 visits ago irritated her knee.  Are you having pain? Yes: NPRS scale: 0/10 Pain location: BIL knees Pain description: burning, dull ache Aggravating factors: walking  following sitting, standing/ walking >1 hour, turning in bed, descending stairs at her apartment Relieving factors: ice, elevation, and ibuprofen  OBJECTIVE: (objective measures completed at initial evaluation unless otherwise dated)  DIAGNOSTIC FINDINGS: 08/12/2021: MR Ankle Left WO Contrast: IMPRESSION: 1. Mild posterior tibial tenosynovitis and distal insertional tendinosis. Thin linear partial-thickness tear involving the medial 50% of the distal tendon insertion on the navicular tuberosity. The navicular tuberosity is large  suggesting a type 3 os naviculare (cornuate navicular) normal variant. 2. Partial-thickness longitudinal split tear of the peroneus brevis tendon. 3. Remote partial-thickness tear and sprain of the anterior talofibular ligament. 4. 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 5. 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   10/15/2021: DG Lt Foot: resection of the hypertrophic tuberosity of the navicular and a intact anchor is placed.  Assessment: Well-healing surgical foot   PATIENT SURVEYS:  FOTO 82%, predicted 85% in 8 visits   COGNITION:           Overall cognitive status: Within functional limits for tasks assessed                          SENSATION: Not tested   EDEMA:  Circumferential: At tibial plateau: Rt: 45cm, Lt: 44cm    MUSCLE LENGTH: Hamstrings: WNL BIL     POSTURE:  BIL genu valgum in stance   PALPATION: No TTP, BIL patella alta    PASSIVE ACCESSORIES: Hypomobile Lt patellar mobility in all planes   LOWER EXTREMITY ROM:   A/PROM Right eval Left eval  Knee flexion 108/118 100/110p!  Knee extension 0/2 0/2   (Blank rows = not tested)   LOWER EXTREMITY MMT:   MMT Right eval Left eval  Hip flexion 3+/5 3+/5  Hip extension 3+/5 3+/5  Hip abduction 3/5 3/5  Knee flexion 5/5 4+/5  Knee extension 5/5 5/5  Ankle dorsiflexion 5/5 5/5  Ankle plantarflexion 5/5 5/5  Ankle inversion 5/5 5/5  Ankle eversion 5/5 5/5   (Blank rows = not tested)   SPECIAL TESTS:  Patellar apprehension: (+) BIL Eccentric step test: (+) on Lt for decreased eccentric control and pain Patellar compression: (+) BIL Lateral pull sign: (+) BIL Thessaly at 0/20 degrees of knee flexion: (-) BIL McMurray's: (-) BIL Apley's: (-) BIL   FUNCTIONAL TESTS:  5xSTS: 15.4 seconds Squat: 75%, no pain DL heel raise x25: WNL     GAIT: Distance walked: 20 ft Assistive device utilized: None Level of assistance:  Modified independence due to use of Lt ankle brace Comments: Lt antalgic gait pattern with decreased Lt step length and slow gait speed       TODAY'S TREATMENT:   TREATMENT 10/25:  Therapeutic Exercise: - nu-step L6 3mwhile taking subjective and planning session with patient - slant board stretch - 2' -  Seated LAQ with YTB 2x12 with 3-sec hold BIL - Sidelying hip abduction with YTB around ankles 2x12 BIL - Bridge - 3x10 - blue TB - Mini squat into heel raise with overhead reach 2x10  Neuromuscular re-ed: Semi tandem (100%) on foam - 45'' bouts Rocker board DF/PF - blue  [facility] Adult PT Treatment:  DATE: 01/27/2022 Therapeutic Exercise: 2-inch lateral heel taps with UE support 2x10 BIL Seated LAQ with YTB 2x10 with 3-sec hold BIL Seated BIL active hamstring stretch x92mn Sidelying hip abduction with YTB around ankles 2x10 BIL Mini squat into heel raise with overhead reach 2x10 Manual Therapy: N/A Neuromuscular re-ed: N/A Therapeutic Activity: N/A Modalities: N/A Self Care: N/A    TREATMENT 10/2:  Therapeutic Exercise: - nu-step L6 586mhile taking subjective and planning session with patient - slant board stretch - 2' - fwd and lat step up - 4'' box - 2x10 - heel raise - 3x15 - knee ext machine - 4x10 _0 # - hack squat machine - 60# - 3x10  Neuromuscular re-ed: Semi tandem (100%) on foam - 4547 bouts Rocker board DF/PF - blue     PATIENT EDUCATION:  Education details: Pt educated on probable underlying pathophysiology behind pain presentation, POC, prognosis, FOTO, and HEP Person educated: Patient Education method: Explanation, Demonstration, and Handouts Education comprehension: verbalized understanding and returned demonstration     HOME EXERCISE PROGRAM: Access Code: [email or web address]: [email or web address] Date: 12/30/2021 Prepared by: TuVanessa Neilton Exercises - Squat  - 1 x daily - 7  x weekly - 3 sets - 15 reps - Sidelying Hip Abduction  - 1 x daily - 7 x weekly - 2 sets - 10 reps - 5 seconds hold - Forward Step Down Touch with Heel  - 1 x daily - 7 x weekly - 3 sets - 10 reps - Supine 90/90 Abdominal Bracing  - 1 x daily - 7 x weekly - 4 sets - 30 seconds hold     ASSESSMENT:   CLINICAL IMPRESSION: Bret tolerated session well with no adverse reaction.  We concentrated mainly on mat exercises today as pt reports increase in pain with machines.  She shows significant fatigue throughout but is able to complete with good form and minimal discomfort.  Cued for pacing.     OBJECTIVE IMPAIRMENTS Abnormal gait, decreased activity tolerance, decreased balance, decreased endurance, decreased mobility, difficulty walking, decreased ROM, decreased strength, hypomobility, increased edema, impaired flexibility, improper body mechanics, postural dysfunction, and pain.    ACTIVITY LIMITATIONS carrying, lifting, bending, sitting, standing, squatting, sleeping, stairs, transfers, and locomotion level   PARTICIPATION LIMITATIONS: cleaning, laundry, driving, community activity, occupation, and yard work   PERSONAL FACTORS  N/A  are also affecting patient's functional outcome.        GOALS: Goals reviewed with patient? Yes   SHORT TERM GOALS: Target date: 01/27/2022  Pt will report understanding and adherence to initial HEP in order to promote independence in the management of primary impairments. Baseline: HEP provided at eval Goal status: MET 10/25       LONG TERM GOALS: Target date: 02/24/2022    Pt will achieve a FOTO score of 85% in order to demonstrate improved functional ability as it relates to the pt's primary impairments. Baseline: 82% Goal status: INITIAL   2.  Pt will achieve a 5xSTS in <12 seconds in order to demonstrate improved functional ability with community transfers. Baseline: 15.4 seconds Goal status: INITIAL   3.  Pt will achieve BIL global hip  strength of 4+/5 in order to progress her independent LE strengthening regimen with less limitation. Baseline: See MMT chart Goal status: INITIAL   4.  Pt will report ability to stand/ walk >2 hours with 0-2/10 pain in order to complete work duties with less limitation. Baseline: >6/10 pain with 1 hour of standing/ walking Goal status:  INITIAL   5.  Pt will demonstrate ability to ascend/ descend 20 stairs with 0-2/10 pain in order to enter and exit her apartment with less limitation. Baseline: >6/10 pain with single eccentric step-down in clinic Goal status: INITIAL       PLAN: PT FREQUENCY: 1x/week   PT DURATION: 8 weeks   PLANNED INTERVENTIONS: Therapeutic exercises, Therapeutic activity, Neuromuscular re-education, Balance training, Gait training, Patient/Family education, Self Care, Joint mobilization, Joint manipulation, Stair training, Aquatic Therapy, Dry Needling, Electrical stimulation, Cryotherapy, Moist heat, Taping, Vasopneumatic device, Biofeedback, Ionotophoresis 70m/ml Dexamethasone, Manual therapy, and Re-evaluation   PLAN FOR NEXT SESSION: Progress quad reinforcement, hip strengthening, knee alignment   KKevan NyReinhartsen PT 02/03/22 12:18 PM

## 2022-02-09 ENCOUNTER — Telehealth: Payer: Self-pay | Admitting: Podiatry

## 2022-02-09 NOTE — Telephone Encounter (Signed)
lvm for patient to contact office to sched appointment  for opu

## 2022-02-10 ENCOUNTER — Encounter: Payer: Self-pay | Admitting: Podiatry

## 2022-02-10 ENCOUNTER — Ambulatory Visit: Payer: No Typology Code available for payment source | Attending: Podiatry

## 2022-02-10 DIAGNOSIS — M25561 Pain in right knee: Secondary | ICD-10-CM | POA: Diagnosis present

## 2022-02-10 DIAGNOSIS — M25562 Pain in left knee: Secondary | ICD-10-CM | POA: Insufficient documentation

## 2022-02-10 DIAGNOSIS — R600 Localized edema: Secondary | ICD-10-CM | POA: Insufficient documentation

## 2022-02-10 DIAGNOSIS — M6281 Muscle weakness (generalized): Secondary | ICD-10-CM | POA: Diagnosis present

## 2022-02-10 DIAGNOSIS — R262 Difficulty in walking, not elsewhere classified: Secondary | ICD-10-CM | POA: Insufficient documentation

## 2022-02-10 NOTE — Therapy (Signed)
OUTPATIENT PHYSICAL THERAPY TREATMENT NOTE   Patient Name: Amanda Paul MRN: [id number] DOB:02-Jun-1975, 46 y.o., female Today's Date: 02/10/2022  PCP: Glendale Chard, MD   REFERRING PROVIDER: Garrel Ridgel, DPM   PT End of Session - 02/10/22 1141     Visit Number 5    Number of Visits 8    Date for PT Re-Evaluation 03/03/22    Authorization Type Aet[employer]    Authorization Time Period FOTO v6, v10    PT Start Time 7096   Pt arrived 10 minutes late to appointment.   PT Stop Time 1225    PT Time Calculation (min) 44 min    Activity Tolerance Patient tolerated treatment well    Behavior During Therapy WFL for tasks assessed/performed               Past Medical History:  Diagnosis Date   Allergy    Arthritis    Hernia, umbilical    Morbid obesity ([place])    Past Surgical History:  Procedure Laterality Date   ADVANCEMENT / RECONSTRUCTION POSTERIOR TIBIAL TENDON / Vincente Liberty Left 10/09/2021   COLONOSCOPY WITH PROPOFOL N/A 06/13/2020   Procedure: COLONOSCOPY WITH PROPOFOL;  Surgeon: Carol Ada, MD;  [facility]: [facility];  Service: Endoscopy;  Laterality: N/A;   HEMOSTASIS CLIP PLACEMENT  06/13/2020   Procedure: HEMOSTASIS CLIP PLACEMENT;  Surgeon: Carol Ada, MD;  Location: [facility];  Service: Endoscopy;;   HERNIA REPAIR     HYSTEROSCOPY WITH D & C N/A 08/26/2014   Procedure: DILATATION AND CURETTAGE /HYSTEROSCOPY and cervical repair of cervical laceration ;  Surgeon: Delsa Bern, MD;  Location: [facility];  Service: Gynecology;  Laterality: N/A;   IR ANGIOGRAM PELVIS SELECTIVE OR SUPRASELECTIVE  10/08/2020   IR ANGIOGRAM PELVIS SELECTIVE OR SUPRASELECTIVE  10/08/2020   IR ANGIOGRAM SELECTIVE EACH ADDITIONAL VESSEL  10/08/2020   IR EMBO TUMOR ORGAN ISCHEMIA INFARCT INC GUIDE ROADMAPPING  10/08/2020   IR RADIOLOGIST EVAL & MGMT  07/10/2020   IR RADIOLOGIST EVAL & MGMT  07/31/2020   IR RADIOLOGIST EVAL & MGMT  09/09/2020   IR RADIOLOGIST EVAL & MGMT   11/18/2020   IR RADIOLOGIST EVAL & MGMT  09/14/2021   IR US GUIDE VASC ACCESS RIGHT  10/08/2020   POLYPECTOMY  06/13/2020   Procedure: POLYPECTOMY;  Surgeon: Carol Ada, MD;  [facility]: [facility];  Service: Endoscopy;;   UMBILICAL HERNIA REPAIR     WISDOM TOOTH EXTRACTION     Patient Active Problem List   Diagnosis Date Noted   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   Uterine leiomyoma 10/08/2020   Cervical high risk HPV (human papillomavirus) test positive 07/31/2019   Pain in right hand 01/19/2019   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   Fibroids 09/30/2015   Hypertension 05/10/2012   Morbid obesity ([place])    Umbilical hernia 28/36/6294   Abnormal cervical Papanicolaou smear 05/13/2002   Low grade squamous intraepithelial lesion (LGSIL) on cervicovaginal cytologic smear 06/10/2000    THERAPY DIAG:  Acute pain of left knee  Acute pain of right knee  Localized edema  Difficulty in walking, not elsewhere classified  Muscle weakness (generalized)  REFERRING DIAG: M25.561,M25.562,G89.29 (ICD-10-CM) - Chronic pain of both knees  PERTINENT HISTORY: Kidner procedure posterior tibial tendon repair left on 10/09/2021, HTN  PRECAUTIONS/RESTRICTIONS:   none  SUBJECTIVE:  Pt reports she can tell her knees are continuing to improve, endorsing decreased pain and stiffness. She also reports continued Hep adherence.   Are you having pain? Yes: NPRS scale: 0/10 Pain  location: BIL knees Pain description: burning, dull ache Aggravating factors: walking following sitting, standing/ walking >1 hour, turning in bed, descending stairs at her apartment Relieving factors: ice, elevation, and ibuprofen  OBJECTIVE: (objective measures completed at initial evaluation unless otherwise dated)  DIAGNOSTIC FINDINGS: 08/12/2021: MR Ankle Left WO Contrast: IMPRESSION: 1. Mild posterior tibial tenosynovitis and distal insertional tendinosis. Thin linear partial-thickness tear involving the medial 50% of the  distal tendon insertion on the navicular tuberosity. The navicular tuberosity is large suggesting a type 3 os naviculare (cornuate navicular) normal variant. 2. Partial-thickness longitudinal split tear of the peroneus brevis tendon. 3. Remote partial-thickness tear and sprain of the anterior talofibular ligament. 4. 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 5. 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   10/15/2021: DG Lt Foot: resection of the hypertrophic tuberosity of the navicular and a intact anchor is placed.  Assessment: Well-healing surgical foot   PATIENT SURVEYS:  FOTO 82%, predicted 85% in 8 visits   COGNITION:           Overall cognitive status: Within functional limits for tasks assessed                          SENSATION: Not tested   EDEMA:  Circumferential: At tibial plateau: Rt: 45cm, Lt: 44cm    MUSCLE LENGTH: Hamstrings: WNL BIL     POSTURE:  BIL genu valgum in stance   PALPATION: No TTP, BIL patella alta    PASSIVE ACCESSORIES: Hypomobile Lt patellar mobility in all planes   LOWER EXTREMITY ROM:   A/PROM Right eval Left eval Right 02/10/2022 Left 02/10/2022  Knee flexion 108/118 100/110p! 110/118 113/115  Knee extension 0/2 0/2 0/2 0/2   (Blank rows = not tested)   LOWER EXTREMITY MMT:   MMT Right eval Left eval Right 02/10/2022 Left 02/10/2022  Hip flexion 3+/5 3+/5 4/5 4/5  Hip extension 3+/5 3+/5 5/5 4+/5  Hip abduction 3/5 3/5 4+/5 5/5  Knee flexion 5/5 4+/5  5/5  Knee extension 5/5 5/5    Ankle dorsiflexion 5/5 5/5    Ankle plantarflexion 5/5 5/5    Ankle inversion 5/5 5/5    Ankle eversion 5/5 5/5     (Blank rows = not tested)   SPECIAL TESTS:  Patellar apprehension: (+) BIL Eccentric step test: (+) on Lt for decreased eccentric control and pain Patellar compression: (+) BIL Lateral pull sign: (+) BIL Thessaly at 0/20 degrees of knee flexion: (-)  BIL McMurray's: (-) BIL Apley's: (-) BIL   FUNCTIONAL TESTS:  5xSTS: 15.4 seconds Squat: 75%, no pain DL heel raise x25: WNL  02/10/2022:  5xSTS: 12 seconds     GAIT: Distance walked: 20 ft Assistive device utilized: None Level of assistance: Modified independence due to use of Lt ankle brace Comments: Lt antalgic gait pattern with decreased Lt step length and slow gait speed       TODAY'S TREATMENT:  [facility] Adult PT Treatment:                                                DATE: 02/10/2022 Therapeutic Exercise: Deadlift x8 with 45# barbell, x6 with 65# barbell, x6 with 75# barbell Standing hip abduction with 7# cable to ankle 2x10 BIL Standing hip flexion with 7#  cable to ankle 2x10 BIL Standing hamstring curl with 7# cable to ankle 2x10 BIL Standing hip extension with 7# cable to ankle 2x10 BIL Manual Therapy: N/A Neuromuscular re-ed: N/A Therapeutic Activity: Re-assessment of objective measures with pt education Modalities: N/A Self Care: N/A   TREATMENT 10/25:  Therapeutic Exercise: - nu-step L6 26mwhile taking subjective and planning session with patient - slant board stretch - 2' -  Seated LAQ with YTB 2x12 with 3-sec hold BIL - Sidelying hip abduction with YTB around ankles 2x12 BIL - Bridge - 3x10 - blue TB - Mini squat into heel raise with overhead reach 2x10  Neuromuscular re-ed: Semi tandem (100%) on foam - 481' bouts Rocker board DF/PF - blue  [facility] Adult PT Treatment:                                                DATE: 01/27/2022 Therapeutic Exercise: 2-inch lateral heel taps with UE support 2x10 BIL Seated LAQ with YTB 2x10 with 3-sec hold BIL Seated BIL active hamstring stretch x142m Sidelying hip abduction with YTB around ankles 2x10 BIL Mini squat into heel raise with overhead reach 2x10 Manual Therapy: N/A Neuromuscular re-ed: N/A Therapeutic Activity: N/A Modalities: N/A Self Care: N/A       PATIENT EDUCATION:  Education  details: Pt educated on probable underlying pathophysiology behind pain presentation, POC, prognosis, FOTO, and HEP Person educated: Patient Education method: Explanation, Demonstration, and Handouts Education comprehension: verbalized understanding and returned demonstration     HOME EXERCISE PROGRAM: Access Code: [email or web address]: [email or web address] Date: 12/30/2021 Prepared by: TuVanessa Foley Exercises - Squat  - 1 x daily - 7 x weekly - 3 sets - 15 reps - Sidelying Hip Abduction  - 1 x daily - 7 x weekly - 2 sets - 10 reps - 5 seconds hold - Forward Step Down Touch with Heel  - 1 x daily - 7 x weekly - 3 sets - 10 reps - Supine 90/90 Abdominal Bracing  - 1 x daily - 7 x weekly - 4 sets - 30 seconds hold     ASSESSMENT:   CLINICAL IMPRESSION: Pt continues to progress well with PT. Upon re-assessment of objective measures, the pt has made excellent improvement in Lt knee flexion AROM and BIL hip strength. She tolerated all progressed exercises well today and will continue to benefit from skilled PT to address her primary impairments and return to her prior level of function with less limitation.     OBJECTIVE IMPAIRMENTS Abnormal gait, decreased activity tolerance, decreased balance, decreased endurance, decreased mobility, difficulty walking, decreased ROM, decreased strength, hypomobility, increased edema, impaired flexibility, improper body mechanics, postural dysfunction, and pain.    ACTIVITY LIMITATIONS carrying, lifting, bending, sitting, standing, squatting, sleeping, stairs, transfers, and locomotion level   PARTICIPATION LIMITATIONS: cleaning, laundry, driving, community activity, occupation, and yard work   PERSONAL FACTORS  N/A  are also affecting patient's functional outcome.        GOALS: Goals reviewed with patient? Yes   SHORT TERM GOALS: Target date: 01/27/2022  Pt will report understanding and adherence to initial HEP in order to promote  independence in the management of primary impairments. Baseline: HEP provided at eval Goal status: MET 10/25       LONG TERM GOALS: Target date: 02/24/2022    Pt will achieve a FOTO score  of 85% in order to demonstrate improved functional ability as it relates to the pt's primary impairments. Baseline: 82% Goal status: INITIAL   2.  Pt will achieve a 5xSTS in <12 seconds in order to demonstrate improved functional ability with community transfers. Baseline: 15.4 seconds 02/10/2022: 12 seconds Goal status: ACHIEVED   3.  Pt will achieve BIL global hip strength of 4+/5 in order to progress her independent LE strengthening regimen with less limitation. Baseline: See MMT chart 02/10/2022: 4+/5 to 5/5 in hip abduction/ extension, 4/5 hip flexion Goal status: IN PROGRESS   4.  Pt will report ability to stand/ walk >2 hours with 0-2/10 pain in order to complete work duties with less limitation. Baseline: >6/10 pain with 1 hour of standing/ walking Goal status: INITIAL   5.  Pt will demonstrate ability to ascend/ descend 20 stairs with 0-2/10 pain in order to enter and exit her apartment with less limitation. Baseline: >6/10 pain with single eccentric step-down in clinic Goal status: INITIAL       PLAN: PT FREQUENCY: 1x/week   PT DURATION: 8 weeks   PLANNED INTERVENTIONS: Therapeutic exercises, Therapeutic activity, Neuromuscular re-education, Balance training, Gait training, Patient/Family education, Self Care, Joint mobilization, Joint manipulation, Stair training, Aquatic Therapy, Dry Needling, Electrical stimulation, Cryotherapy, Moist heat, Taping, Vasopneumatic device, Biofeedback, Ionotophoresis 42m/ml Dexamethasone, Manual therapy, and Re-evaluation   PLAN FOR NEXT SESSION: Progress quad reinforcement, hip strengthening, knee alignment   YVanessa Shelby PT, DPT 02/10/22 12:26 PM

## 2022-02-17 ENCOUNTER — Ambulatory Visit: Payer: No Typology Code available for payment source | Admitting: Physical Therapy

## 2022-02-18 NOTE — Telephone Encounter (Signed)
Spoke with pt and an appt has been scheduled

## 2022-02-24 ENCOUNTER — Ambulatory Visit (INDEPENDENT_AMBULATORY_CARE_PROVIDER_SITE_OTHER): Payer: No Typology Code available for payment source

## 2022-02-24 ENCOUNTER — Ambulatory Visit: Payer: No Typology Code available for payment source

## 2022-02-24 DIAGNOSIS — M6281 Muscle weakness (generalized): Secondary | ICD-10-CM

## 2022-02-24 DIAGNOSIS — M25561 Pain in right knee: Secondary | ICD-10-CM

## 2022-02-24 DIAGNOSIS — R600 Localized edema: Secondary | ICD-10-CM

## 2022-02-24 DIAGNOSIS — M25562 Pain in left knee: Secondary | ICD-10-CM | POA: Diagnosis not present

## 2022-02-24 DIAGNOSIS — R262 Difficulty in walking, not elsewhere classified: Secondary | ICD-10-CM

## 2022-02-24 DIAGNOSIS — Q666 Other congenital valgus deformities of feet: Secondary | ICD-10-CM

## 2022-02-24 NOTE — Therapy (Signed)
OUTPATIENT PHYSICAL THERAPY TREATMENT NOTE/ DISCHARGE SUMMARY   Patient Name: Amanda Paul MRN: [id number] DOB:Sep 11, 1975, 46 y.o., female Today's Date: 02/24/2022  PCP: Glendale Chard, MD   REFERRING PROVIDER: Garrel Ridgel, DPM   PT End of Session - 02/24/22 1046     Visit Number 6    Number of Visits 8    Date for PT Re-Evaluation 03/03/22    Authorization Type [employer]    Authorization Time Period FOTO v6, v10    PT Start Time 1046    PT Stop Time 1126    PT Time Calculation (min) 40 min    Activity Tolerance Patient tolerated treatment well    Behavior During Therapy WFL for tasks assessed/performed                Past Medical History:  Diagnosis Date   Allergy    Arthritis    Hernia, umbilical    Morbid obesity ([place])    Past Surgical History:  Procedure Laterality Date   ADVANCEMENT / [place] / Vincente Liberty Left 10/09/2021   COLONOSCOPY WITH PROPOFOL N/A 06/13/2020   Procedure: COLONOSCOPY WITH PROPOFOL;  Surgeon: Carol Ada, MD;  Location: [facility];  Service: Endoscopy;  Laterality: N/A;   HEMOSTASIS CLIP PLACEMENT  06/13/2020   Procedure: HEMOSTASIS CLIP PLACEMENT;  Surgeon: Carol Ada, MD;  Location: [facility];  Service: Endoscopy;;   HERNIA REPAIR     HYSTEROSCOPY WITH D & C N/A 08/26/2014   Procedure: DILATATION AND CURETTAGE /HYSTEROSCOPY and cervical repair of cervical laceration ;  Surgeon: Delsa Bern, MD;  Location: [facility];  Service: Gynecology;  Laterality: N/A;   IR ANGIOGRAM PELVIS SELECTIVE OR SUPRASELECTIVE  10/08/2020   IR ANGIOGRAM PELVIS SELECTIVE OR SUPRASELECTIVE  10/08/2020   IR ANGIOGRAM SELECTIVE EACH ADDITIONAL VESSEL  10/08/2020   IR EMBO TUMOR ORGAN ISCHEMIA INFARCT INC GUIDE ROADMAPPING  10/08/2020   IR RADIOLOGIST EVAL & MGMT  07/10/2020   IR RADIOLOGIST EVAL & MGMT  07/31/2020   IR RADIOLOGIST EVAL & MGMT  09/09/2020   IR RADIOLOGIST EVAL & MGMT  11/18/2020   IR RADIOLOGIST  EVAL & MGMT  09/14/2021   IR US GUIDE VASC ACCESS RIGHT  10/08/2020   POLYPECTOMY  06/13/2020   Procedure: POLYPECTOMY;  Surgeon: Carol Ada, MD;  Location: [facility];  Service: Endoscopy;;   UMBILICAL HERNIA REPAIR     WISDOM TOOTH EXTRACTION     Patient Active Problem List   Diagnosis Date Noted   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   Uterine leiomyoma 10/08/2020   Cervical high risk HPV (human papillomavirus) test positive 07/31/2019   Pain in right hand 01/19/2019   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   Fibroids 09/30/2015   Hypertension 05/10/2012   Morbid obesity ([place])    Umbilical hernia 64/33/2951   Abnormal cervical Papanicolaou smear 05/13/2002   Low grade squamous intraepithelial lesion (LGSIL) on cervicovaginal cytologic smear 06/10/2000    THERAPY DIAG:  Acute pain of left knee  Acute pain of right knee  Localized edema  Difficulty in walking, not elsewhere classified  Muscle weakness (generalized)  REFERRING DIAG: M25.561,M25.562,G89.29 (ICD-10-CM) - Chronic pain of both knees  PERTINENT HISTORY: [facility]ner procedure posterior tibial tendon repair left on 10/09/2021, HTN  PRECAUTIONS/RESTRICTIONS:   none  SUBJECTIVE:  Pt reports feeling well today, adding that her exercises have been very helpful. Pt reports readiness for discharge from PT at this time.  Are you having pain? Yes: NPRS scale: 0/10 Pain location: BIL knees Pain description:  burning, dull ache Aggravating factors: walking following sitting, standing/ walking >1 hour, turning in bed, descending stairs at her apartment Relieving factors: ice, elevation, and ibuprofen  OBJECTIVE: (objective measures completed at initial evaluation unless otherwise dated)  DIAGNOSTIC FINDINGS: 08/12/2021: MR Ankle Left WO Contrast: IMPRESSION: 1. Mild posterior tibial tenosynovitis and distal insertional tendinosis. Thin linear partial-thickness tear involving the medial 50% of the distal tendon insertion on the  navicular tuberosity. The navicular tuberosity is large suggesting a type 3 os naviculare (cornuate navicular) normal variant. 2. Partial-thickness longitudinal split tear of the peroneus brevis tendon. 3. Remote partial-thickness tear and sprain of the anterior talofibular ligament. 4. 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 5. 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   10/15/2021: DG Lt Foot: resection of the hypertrophic tuberosity of the navicular and a intact anchor is placed.  Assessment: Well-healing surgical foot   PATIENT SURVEYS:  FOTO 82%, predicted 85% in 8 visits  FOTO 02/24/2022: 82%   COGNITION:           Overall cognitive status: Within functional limits for tasks assessed                          SENSATION: Not tested   EDEMA:  Circumferential: At tibial plateau: Rt: 45cm, Lt: 44cm    MUSCLE LENGTH: Hamstrings: WNL BIL     POSTURE:  BIL genu valgum in stance   PALPATION: No TTP, BIL patella alta    PASSIVE ACCESSORIES: Hypomobile Lt patellar mobility in all planes   LOWER EXTREMITY ROM:   A/PROM Right eval Left eval Right 02/10/2022 Left 02/10/2022  Knee flexion 108/118 100/110p! 110/118 113/115  Knee extension 0/2 0/2 0/2 0/2   (Blank rows = not tested)   LOWER EXTREMITY MMT:   MMT Right eval Left eval Right 02/10/2022 Left 02/10/2022 Right 02/24/2022 Left 02/24/2022  Hip flexion 3+/5 3+/5 4/5 4/5 5/5 5/5  Hip extension 3+/5 3+/5 5/5 4+/5    Hip abduction 3/5 3/5 4+/5 5/5    Knee flexion 5/5 4+/5  5/5    Knee extension 5/5 5/5      Ankle dorsiflexion 5/5 5/5      Ankle plantarflexion 5/5 5/5      Ankle inversion 5/5 5/5      Ankle eversion 5/5 5/5       (Blank rows = not tested)   SPECIAL TESTS:  Patellar apprehension: (+) BIL Eccentric step test: (+) on Lt for decreased eccentric control and pain Patellar compression: (+) BIL Lateral pull sign: (+)  BIL Thessaly at 0/20 degrees of knee flexion: (-) BIL McMurray's: (-) BIL Apley's: (-) BIL   FUNCTIONAL TESTS:  5xSTS: 15.4 seconds Squat: 75%, no pain DL heel raise x25: WNL  02/10/2022:  5xSTS: 12 seconds  02/24/2022: Ascend/ descend 20 steps with step-through pattern and no UE support WNL with 0/10 pain      GAIT: Distance walked: 20 ft Assistive device utilized: None Level of assistance: Modified independence due to use of Lt ankle brace Comments: Lt antalgic gait pattern with decreased Lt step length and slow gait speed       TODAY'S TREATMENT:  [facility] Adult PT Treatment:  DATE: 02/24/2022 Therapeutic Exercise: Seated LAQ with RTB 2x10 BIL 25# kettlebell deadlift 3x8 Sidelying hip abduction with RTB 2x10 with 3-sec hold Manual Therapy: N/A Neuromuscular re-ed: N/A Therapeutic Activity: Re-assessment of objective measures with pt education Re-administration of FOTO with pt education Update to HEP with pt education Modalities: N/A Self Care: N/A   [facility] Adult PT Treatment:                                                DATE: 02/10/2022 Therapeutic Exercise: Deadlift x8 with 45# barbell, x6 with 65# barbell, x6 with 75# barbell Standing hip abduction with 7# cable to ankle 2x10 BIL Standing hip flexion with 7# cable to ankle 2x10 BIL Standing hamstring curl with 7# cable to ankle 2x10 BIL Standing hip extension with 7# cable to ankle 2x10 BIL Manual Therapy: N/A Neuromuscular re-ed: N/A Therapeutic Activity: Re-assessment of objective measures with pt education Modalities: N/A Self Care: N/A   TREATMENT 10/25:  Therapeutic Exercise: - nu-step L6 22mwhile taking subjective and planning session with patient - slant board stretch - 2' -  Seated LAQ with YTB 2x12 with 3-sec hold BIL - Sidelying hip abduction with YTB around ankles 2x12 BIL - Bridge - 3x10 - blue TB - Mini squat into heel raise with  overhead reach 2x10  Neuromuscular re-ed: Semi tandem (100%) on foam - 429' bouts Rocker board DF/PF - blue        PATIENT EDUCATION:  Education details: Pt educated on probable underlying pathophysiology behind pain presentation, POC, prognosis, FOTO, and HEP Person educated: Patient Education method: Explanation, Demonstration, and Handouts Education comprehension: verbalized understanding and returned demonstration     HOME EXERCISE PROGRAM: Access Code: [email or web address]: [email or web address] Date: 12/30/2021 Prepared by: TVanessa Sheridan  Exercises - Squat  - 1 x daily - 7 x weekly - 3 sets - 15 reps - Sidelying Hip Abduction  - 1 x daily - 7 x weekly - 2 sets - 10 reps - 5 seconds hold - Forward Step Down Touch with Heel  - 1 x daily - 7 x weekly - 3 sets - 10 reps - Supine 90/90 Abdominal Bracing  - 1 x daily - 7 x weekly - 4 sets - 30 seconds hold  Added 02/24/2022: - Sitting Knee Extension with Resistance  - 1 x daily - 7 x weekly - 3 sets - 10 reps - Half Deadlift with Kettlebell  - 1 x daily - 7 x weekly - 3 sets - 10 reps     ASSESSMENT:   CLINICAL IMPRESSION: Upon re-assessment of pt's goals, she has met all of her functional rehab goals, making excellent progress in global LE strength, functional stair negotiation, functional standing ability, and pain levels. Due to these factors, she is discharged from PT at this time.      OBJECTIVE IMPAIRMENTS Abnormal gait, decreased activity tolerance, decreased balance, decreased endurance, decreased mobility, difficulty walking, decreased ROM, decreased strength, hypomobility, increased edema, impaired flexibility, improper body mechanics, postural dysfunction, and pain.    ACTIVITY LIMITATIONS carrying, lifting, bending, sitting, standing, squatting, sleeping, stairs, transfers, and locomotion level   PARTICIPATION LIMITATIONS: cleaning, laundry, driving, community activity, occupation, and yard work    PERSONAL FACTORS  N/A  are also affecting patient's functional outcome.        GOALS: Goals reviewed with patient? Yes  SHORT TERM GOALS: Target date: 01/27/2022  Pt will report understanding and adherence to initial HEP in order to promote independence in the management of primary impairments. Baseline: HEP provided at eval Goal status: MET 10/25       LONG TERM GOALS: Target date: 02/24/2022    Pt will achieve a FOTO score of 85% in order to demonstrate improved functional ability as it relates to the pt's primary impairments. Baseline: 82% 02/24/2022: 82% Goal status: NOT MET   2.  Pt will achieve a 5xSTS in <12 seconds in order to demonstrate improved functional ability with community transfers. Baseline: 15.4 seconds 02/10/2022: 12 seconds Goal status: ACHIEVED   3.  Pt will achieve BIL global hip strength of 4+/5 in order to progress her independent LE strengthening regimen with less limitation. Baseline: See MMT chart 02/10/2022: 4+/5 to 5/5 in hip abduction/ extension, 4/5 hip flexion 02/24/2022: 4+/5 to 5/5 global hip MMT Goal status: ACHIEVED   4.  Pt will report ability to stand/ walk >2 hours with 0-2/10 pain in order to complete work duties with less limitation. Baseline: >6/10 pain with 1 hour of standing/ walking 02/24/2022: Pt reports ability to stand throughout her workday without pain Goal status: ACHIEVED   5.  Pt will demonstrate ability to ascend/ descend 20 stairs with 0-2/10 pain in order to enter and exit her apartment with less limitation. Baseline: >6/10 pain with single eccentric step-down in clinic 02/24/2022: Ascend/ descend 20 steps with step-through pattern and no UE support WNL with 0/10 pain Goal status: ACHIEVED       PLAN: PT FREQUENCY: 1x/week   PT DURATION: 8 weeks   PLANNED INTERVENTIONS: Therapeutic exercises, Therapeutic activity, Neuromuscular re-education, Balance training, Gait training, Patient/Family education, Self Care,  Joint mobilization, Joint manipulation, Stair training, Aquatic Therapy, Dry Needling, Electrical stimulation, Cryotherapy, Moist heat, Taping, Vasopneumatic device, Biofeedback, Ionotophoresis 34m/ml Dexamethasone, Manual therapy, and Re-evaluation   PLAN FOR NEXT SESSION: Pt is discharged from PT at this time.  PHYSICAL THERAPY DISCHARGE SUMMARY  Visits from Start of Care: 6  Current functional level related to goals / functional outcomes: Pt has met all of her functional rehab goals.   Remaining deficits: N/A   Education / Equipment: Updated HEP   Patient agrees to discharge. Patient goals were met. Patient is being discharged due to not returning since the last visit.    YVanessa [place] PT, DPT 02/24/22 11:26 AM

## 2022-02-24 NOTE — Progress Notes (Signed)
Patient presents today to pick up custom molded foot orthotics, diagnosed with pes planovalgus by Dr. Milinda Pointer.   Orthotics were dispensed and fit was satisfactory. Reviewed instructions for break-in and wear. Written instructions given to patient.  Patient will follow up as needed.   [facility] - order # [id number]

## 2022-03-10 ENCOUNTER — Ambulatory Visit: Payer: No Typology Code available for payment source

## 2022-03-21 ENCOUNTER — Other Ambulatory Visit: Payer: Self-pay | Admitting: Podiatry

## 2022-03-31 ENCOUNTER — Ambulatory Visit
Admission: RE | Admit: 2022-03-31 | Discharge: 2022-03-31 | Disposition: A | Discharge status: Home / Self Care | Payer: No Typology Code available for payment source | Source: Ambulatory Visit | Attending: Obstetrics & Gynecology | Admitting: Obstetrics & Gynecology

## 2022-03-31 DIAGNOSIS — Z1231 Encounter for screening mammogram for malignant neoplasm of breast: Secondary | ICD-10-CM

## 2022-05-24 ENCOUNTER — Encounter: Payer: No Typology Code available for payment source | Admitting: Internal Medicine

## 2022-05-24 NOTE — Patient Instructions (Signed)
Hypertension, Adult Hypertension is another name for high blood pressure. High blood pressure forces your heart to work harder to pump blood. This can cause problems over time. There are two numbers in a blood pressure reading. There is a top number (systolic) over a bottom number (diastolic). It is best to have a blood pressure that is below 120/80. What are the causes? The cause of this condition is not known. Some other conditions can lead to high blood pressure. What increases the risk? Some lifestyle factors can make you more likely to develop high blood pressure: Smoking. Not getting enough exercise or physical activity. Being overweight. Having too much fat, sugar, calories, or salt (sodium) in your diet. Drinking too much alcohol. Other risk factors include: Having any of these conditions: Heart disease. Diabetes. High cholesterol. Kidney disease. Obstructive sleep apnea. Having a family history of high blood pressure and high cholesterol. Age. The risk increases with age. Stress. What are the signs or symptoms? High blood pressure may not cause symptoms. Very high blood pressure (hypertensive crisis) may cause: Headache. Fast or uneven heartbeats (palpitations). Shortness of breath. Nosebleed. Vomiting or feeling like you may vomit (nauseous). Changes in how you see. Very bad chest pain. Feeling dizzy. Seizures. How is this treated? This condition is treated by making healthy lifestyle changes, such as: Eating healthy foods. Exercising more. Drinking less alcohol. Your doctor may prescribe medicine if lifestyle changes do not help enough and if: Your top number is above 130. Your bottom number is above 80. Your personal target blood pressure may vary. Follow these instructions at home: Eating and drinking  If told, follow the DASH eating plan. To follow this plan: Fill one half of your plate at each meal with fruits and vegetables. Fill one fourth of your plate  at each meal with whole grains. Whole grains include whole-wheat pasta, brown rice, and whole-grain bread. Eat or drink low-fat dairy products, such as skim milk or low-fat yogurt. Fill one fourth of your plate at each meal with low-fat (lean) proteins. Low-fat proteins include fish, chicken without skin, eggs, beans, and tofu. Avoid fatty meat, cured and processed meat, or chicken with skin. Avoid pre-made or processed food. Limit the amount of salt in your diet to less than 1,500 mg each day. Do not drink alcohol if: Your doctor tells you not to drink. You are pregnant, may be pregnant, or are planning to become pregnant. If you drink alcohol: Limit how much you have to: 0-1 drink a day for women. 0-2 drinks a day for men. Know how much alcohol is in your drink. In the [place] one drink equals one 12 oz bottle of beer (355 mL), one 5 oz glass of wine (148 mL), or one 1 oz glass of hard liquor (44 mL). Lifestyle  Work with your doctor to stay at a healthy weight or to lose weight. Ask your doctor what the best weight is for you. Get at least 30 minutes of exercise that causes your heart to beat faster (aerobic exercise) most days of the week. This may include walking, swimming, or biking. Get at least 30 minutes of exercise that strengthens your muscles (resistance exercise) at least 3 days a week. This may include lifting weights or doing Pilates. Do not smoke or use any products that contain nicotine or tobacco. If you need help quitting, ask your doctor. Check your blood pressure at home as told by your doctor. Keep all follow-up visits. Medicines Take over-the-counter and prescription medicines  only as told by your doctor. Follow directions carefully. Do not skip doses of blood pressure medicine. The medicine does not work as well if you skip doses. Skipping doses also puts you at risk for problems. Ask your doctor about side effects or reactions to medicines that you should watch  for. Contact a doctor if: You think you are having a reaction to the medicine you are taking. You have headaches that keep coming back. You feel dizzy. You have swelling in your ankles. You have trouble with your vision. Get help right away if: You get a very bad headache. You start to feel mixed up (confused). You feel weak or numb. You feel faint. You have very bad pain in your: Chest. Belly (abdomen). You vomit more than once. You have trouble breathing. These symptoms may be an emergency. Get help right away. Call 911. Do not wait to see if the symptoms will go away. Do not drive yourself to the hospital. Summary Hypertension is another name for high blood pressure. High blood pressure forces your heart to work harder to pump blood. For most people, a normal blood pressure is less than 120/80. Making healthy choices can help lower blood pressure. If your blood pressure does not get lower with healthy choices, you may need to take medicine. This information is not intended to replace advice given to you by your health care provider. Make sure you discuss any questions you have with your health care provider. Document Revised: 01/15/2021 Document Reviewed: 01/15/2021 Elsevier Patient Education  [place]. Exercising to [employer] Getting regular exercise is important for everyone. It is especially important if you are overweight. Being overweight increases your risk of heart disease, stroke, diabetes, high blood pressure, and several types of cancer. Exercising, and reducing the calories you consume, can help you lose weight and improve fitness and health. Exercise can be moderate or vigorous intensity. To lose weight, most people need to do a certain amount of moderate or vigorous-intensity exercise each week. How can exercise affect me? You lose weight when you exercise enough to burn more calories than you eat. Exercise also reduces body fat and builds muscle. The more  muscle you have, the more calories you burn. Exercise also: Improves mood. Reduces stress and tension. Improves your overall fitness, flexibility, and endurance. Increases bone strength. Moderate-intensity exercise  Moderate-intensity exercise is any activity that gets you moving enough to burn at least three times more energy (calories) than if you were sitting. Examples of moderate exercise include: Walking a mile in 15 minutes. Doing light yard work. Biking at an easy pace. Most people should get at least 150 minutes of moderate-intensity exercise a week to maintain their body weight. Vigorous-intensity exercise Vigorous-intensity exercise is any activity that gets you moving enough to burn at least six times more calories than if you were sitting. When you exercise at this intensity, you should be working hard enough that you are not able to carry on a conversation. Examples of vigorous exercise include: Running. Playing a team sport, such as football, basketball, and soccer. Jumping rope. Most people should get at least 75 minutes a week of vigorous exercise to maintain their body weight. What actions can I take to lose weight? The amount of exercise you need to lose weight depends on: Your age. The type of exercise. Any health conditions you have. Your overall physical ability. Talk to your health care provider about how much exercise you need and what types of activities  are safe for you. Nutrition  Make changes to your diet as told by your health care provider or diet and nutrition specialist (dietitian). This may include: Eating fewer calories. Eating more protein. Eating less unhealthy fats. Eating a diet that includes fresh fruits and vegetables, whole grains, low-fat dairy products, and lean protein. Avoiding foods with added fat, salt, and sugar. Drink plenty of water while you exercise to prevent dehydration or heat stroke. Activity Choose an activity that you enjoy  and set realistic goals. Your health care provider can help you make an exercise plan that works for you. Exercise at a moderate or vigorous intensity most days of the week. The intensity of exercise may vary from person to person. You can tell how intense a workout is for you by paying attention to your breathing and heartbeat. Most people will notice their breathing and heartbeat get faster with more intense exercise. Do resistance training twice each week, such as: Push-ups. Sit-ups. Lifting weights. Using resistance bands. Getting short amounts of exercise can be just as helpful as long, structured periods of exercise. If you have trouble finding time to exercise, try doing these things as part of your daily routine: Get up, stretch, and walk around every 30 minutes throughout the day. Go for a walk during your lunch break. Park your car farther away from your destination. If you take public transportation, get off one stop early and walk the rest of the way. Make phone calls while standing up and walking around. Take the stairs instead of elevators or escalators. Wear comfortable clothes and shoes with good support. Do not exercise so much that you hurt yourself, feel dizzy, or get very short of breath. Where to find more information U.S. Department of Health and Human Services: [employer]at Centers for Disease Control and Prevention: http://www.wolf.info/ Contact a health care provider: Before starting a new exercise program. If you have questions or concerns about your weight. If you have a medical problem that keeps you from exercising. Get help right away if: You have any of the following while exercising: Injury. Dizziness. Difficulty breathing or shortness of breath that does not go away when you stop exercising. Chest pain. Rapid heartbeat. These symptoms may represent a serious problem that is an emergency. Do not wait to see if the symptoms will go away. Get medical help right away.  Call your local emergency services (911 in the [place] Do not drive yourself to the hospital. Summary Getting regular exercise is especially important if you are overweight. Being overweight increases your risk of heart disease, stroke, diabetes, high blood pressure, and several types of cancer. Losing weight happens when you burn more calories than you eat. Reducing the amount of calories you eat, and getting regular moderate or vigorous exercise each week, helps you lose weight. This information is not intended to replace advice given to you by your health care provider. Make sure you discuss any questions you have with your health care provider. Document Revised: 05/25/2020 Document Reviewed: 05/25/2020 Elsevier Patient Education  [place].

## 2022-05-24 NOTE — Progress Notes (Unsigned)
Subjective:     Patient ID: Amanda Paul , female    DOB: [birth year] , 47 y.o.   MRN: [id number]   No chief complaint on file.   HPI  Patient is here for blood pressure & weight check. Patient states compliance with medication and has no other issues.   Hypertension This is a chronic problem. The current episode started more than 1 year ago. The problem has been gradually improving since onset. The problem is controlled. Pertinent negatives include no blurred vision or palpitations. Risk factors for coronary artery disease include dyslipidemia and obesity. Past treatments include calcium channel blockers.     Past Medical History:  Diagnosis Date   Allergy    Arthritis    Hernia, umbilical    Morbid obesity (HCC)      Family History  Problem Relation Age of Onset   Hypertension Mother      Current Outpatient Medications:    acetaminophen (TYLENOL) 325 MG tablet, Take 650 mg by mouth every 6 (six) hours as needed for moderate pain., Disp: , Rfl:    amLODipine (NORVASC) 10 MG tablet, Take 1 tablet (10 mg total) by mouth daily., Disp: 90 tablet, Rfl: 3   Calcium Carb-Cholecalciferol (CALCIUM 500 +D PO), Take 1 tablet by mouth daily., Disp: , Rfl:    CINNAMON PO, Take 1 capsule by mouth daily., Disp: , Rfl:    clindamycin (CLEOCIN) 150 MG capsule, Take 1 capsule (150 mg total) by mouth 3 (three) times daily., Disp: 30 capsule, Rfl: 0   ibuprofen (ADVIL) 600 MG tablet, TAKE 1 TABLET BY MOUTH EVERY 8 HOURS AS NEEDED., Disp: 90 tablet, Rfl: 1   ipratropium (ATROVENT) 0.03 % nasal spray, Place 2 sprays into both nostrils 3 (three) times daily., Disp: , Rfl:    magnesium oxide (MAG-OX) 400 MG tablet, Take 400 mg by mouth daily., Disp: , Rfl:    Multiple Vitamin (MULTIVITAMIN) capsule, Take 1 capsule by mouth daily., Disp: , Rfl:    ondansetron (ZOFRAN) 4 MG tablet, Take 1 tablet (4 mg total) by mouth every 8 (eight) hours as needed., Disp: 20 tablet, Rfl: 0    rosuvastatin (CRESTOR) 10 MG tablet, Take 1 tablet (10 mg total) by mouth daily., Disp: 90 tablet, Rfl: 3   Vitamin D, Ergocalciferol, (DRISDOL) 1.25 MG (50000 UNIT) CAPS capsule, Take 1 capsule (50,000 Units total) by mouth every 7 (seven) days., Disp: 12 capsule, Rfl: 1   Allergies  Allergen Reactions   Amoxicillin Hives, Itching and Swelling    Swollen eyes and throat.   Penicillins Hives and Swelling     Review of Systems  Constitutional: Negative.   Eyes:  Negative for blurred vision.  Respiratory: Negative.    Cardiovascular: Negative.  Negative for palpitations.  Neurological: Negative.   Psychiatric/Behavioral: Negative.       There were no vitals filed for this visit. There is no height or weight on file to calculate BMI.   Objective:  Physical Exam      Assessment And Plan:     There are no diagnoses linked to this encounter.    Patient was given opportunity to ask questions. Patient verbalized understanding of the plan and was able to repeat key elements of the plan. All questions were answered to their satisfaction.  Coolidge Breeze, CMA   I, Coolidge Breeze, CMA, have reviewed all documentation for this visit. The documentation on 05/24/22 for the exam, diagnosis, procedures, and orders are all accurate  and complete.   IF YOU HAVE BEEN REFERRED TO A SPECIALIST, IT MAY TAKE 1-2 WEEKS TO SCHEDULE/PROCESS THE REFERRAL. IF YOU HAVE NOT HEARD FROM US/SPECIALIST IN TWO WEEKS, PLEASE GIVE [place] A CALL AT [phone].   THE PATIENT IS ENCOURAGED TO PRACTICE SOCIAL DISTANCING DUE TO THE COVID-19 PANDEMIC.

## 2022-06-01 ENCOUNTER — Ambulatory Visit: Payer: No Typology Code available for payment source | Admitting: Internal Medicine

## 2022-06-21 ENCOUNTER — Encounter: Payer: Self-pay | Admitting: Podiatry

## 2022-08-17 ENCOUNTER — Telehealth: Payer: Self-pay

## 2022-08-17 NOTE — Telephone Encounter (Signed)
Left voicemail for patient to let her know that Dr. Harraway-Smith will be out of the office tomorrow and Dr. Anyanwu will be filling her space. Left our contact information for her to call back and reschedule if she'd prefer to remain on Dr. Harraway's schedule. 

## 2022-08-18 ENCOUNTER — Ambulatory Visit: Payer: No Typology Code available for payment source | Admitting: Obstetrics & Gynecology

## 2022-09-08 ENCOUNTER — Other Ambulatory Visit: Payer: Self-pay | Admitting: Internal Medicine

## 2022-09-15 ENCOUNTER — Encounter: Payer: Self-pay | Admitting: Internal Medicine

## 2022-09-15 ENCOUNTER — Ambulatory Visit: Payer: No Typology Code available for payment source | Admitting: Internal Medicine

## 2022-09-15 VITALS — BP 132/84 | HR 76 | Temp 98.5°F | Ht 65.0 in | Wt 292.8 lb

## 2022-09-15 DIAGNOSIS — Z0000 Encounter for general adult medical examination without abnormal findings: Secondary | ICD-10-CM

## 2022-09-15 DIAGNOSIS — E559 Vitamin D deficiency, unspecified: Secondary | ICD-10-CM

## 2022-09-15 DIAGNOSIS — I10 Essential (primary) hypertension: Secondary | ICD-10-CM

## 2022-09-15 DIAGNOSIS — R911 Solitary pulmonary nodule: Secondary | ICD-10-CM | POA: Diagnosis not present

## 2022-09-15 DIAGNOSIS — Z6841 Body Mass Index (BMI) 40.0 and over, adult: Secondary | ICD-10-CM

## 2022-09-15 NOTE — Progress Notes (Signed)
I,Victoria T Hamilton,acting as a scribe for Gwynneth Aliment, MD.,have documented all relevant documentation on the behalf of Gwynneth Aliment, MD,as directed by  Gwynneth Aliment, MD while in the presence of Gwynneth Aliment, MD.   Subjective:     Patient ID: Amanda Paul , female    DOB: Jan 06, 1976 , 47 y.o.   MRN: [id number]   Chief Complaint  Patient presents with   Annual Exam   Hypertension    HPI  Patient is here for annual physical. She reports compliance with meds.  She denies headaches, chest pain and shortness of breath.  She has no concerns at this time. She is followed by Dr Erin Fulling for her GYN care.   She was recently promoted at work, this is stressful but she is excited about the opportunity. She has had to work overtime as she gets acclimated in her new position.    Hypertension This is a chronic problem. The current episode started more than 1 year ago. The problem has been gradually improving since onset. The problem is controlled. Pertinent negatives include no blurred vision or palpitations. Risk factors for coronary artery disease include dyslipidemia and obesity. Past treatments include calcium channel blockers.     Past Medical History:  Diagnosis Date   Allergy    Arthritis    Hernia, umbilical    Morbid obesity (HCC)      Family History  Problem Relation Age of Onset   Hypertension Mother      Current Outpatient Medications:    amLODipine (NORVASC) 10 MG tablet, TAKE 1 TABLET BY MOUTH EVERY DAY, Disp: 90 tablet, Rfl: 3   Calcium Carb-Cholecalciferol (CALCIUM 500 +D PO), Take 1 tablet by mouth daily., Disp: , Rfl:    CINNAMON PO, Take 1 capsule by mouth daily., Disp: , Rfl:    ibuprofen (ADVIL) 600 MG tablet, TAKE 1 TABLET BY MOUTH EVERY 8 HOURS AS NEEDED., Disp: 90 tablet, Rfl: 1   rosuvastatin (CRESTOR) 10 MG tablet, Take 1 tablet (10 mg total) by mouth daily., Disp: 90 tablet, Rfl: 3   Vitamin D, Ergocalciferol, (DRISDOL) 1.25 MG  (50000 UNIT) CAPS capsule, Take 1 capsule (50,000 Units total) by mouth every 7 (seven) days., Disp: 12 capsule, Rfl: 1   acetaminophen (TYLENOL) 325 MG tablet, Take 650 mg by mouth every 6 (six) hours as needed for moderate pain. (Patient not taking: Reported on 09/15/2022), Disp: , Rfl:    clindamycin (CLEOCIN) 150 MG capsule, Take 1 capsule (150 mg total) by mouth 3 (three) times daily. (Patient not taking: Reported on 09/15/2022), Disp: 30 capsule, Rfl: 0   ipratropium (ATROVENT) 0.03 % nasal spray, Place 2 sprays into both nostrils 3 (three) times daily. (Patient not taking: Reported on 09/15/2022), Disp: , Rfl:    magnesium oxide (MAG-OX) 400 MG tablet, Take 400 mg by mouth daily. (Patient not taking: Reported on 09/15/2022), Disp: , Rfl:    Multiple Vitamin (MULTIVITAMIN) capsule, Take 1 capsule by mouth daily. (Patient not taking: Reported on 09/15/2022), Disp: , Rfl:    ondansetron (ZOFRAN) 4 MG tablet, Take 1 tablet (4 mg total) by mouth every 8 (eight) hours as needed. (Patient not taking: Reported on 09/15/2022), Disp: 20 tablet, Rfl: 0   Allergies  Allergen Reactions   Amoxicillin Hives, Itching and Swelling    Swollen eyes and throat.   Penicillins Hives and Swelling      The patient states she uses none for birth control. Last LMP was Patient's last  menstrual period was 09/08/2022 (exact date).. Negative for Dysmenorrhea. Negative for: breast discharge, breast lump(s), breast pain and breast self exam. Associated symptoms include abnormal vaginal bleeding. Pertinent negatives include abnormal bleeding (hematology), anxiety, decreased libido, depression, difficulty falling sleep, dyspareunia, history of infertility, nocturia, sexual dysfunction, sleep disturbances, urinary incontinence, urinary urgency, vaginal discharge and vaginal itching. Diet regular.The patient states her exercise level is  intermittent.  . The patient's tobacco use is:  Social History   Tobacco Use  Smoking Status Never   Smokeless Tobacco Never  . She has been exposed to passive smoke. The patient's alcohol use is:  Social History   Substance and Sexual Activity  Alcohol Use Yes   Alcohol/week: 1.0 standard drink of alcohol   Types: 1 Cans of beer per week   Comment: rare    Review of Systems  Constitutional: Negative.   HENT: Negative.    Eyes: Negative.  Negative for blurred vision.  Respiratory: Negative.    Cardiovascular: Negative.  Negative for palpitations.  Gastrointestinal: Negative.   Endocrine: Negative.   Genitourinary: Negative.   Musculoskeletal: Negative.   Skin: Negative.   Allergic/Immunologic: Negative.   Neurological: Negative.   Hematological: Negative.   Psychiatric/Behavioral: Negative.       Today's Vitals   09/15/22 1008  BP: 132/84  Pulse: 76  Temp: 98.5 F (36.9 C)  SpO2: 98%  Weight: 292 lb 12.8 oz (132.8 kg)  Height: 5\' 5"  (1.651 m)   Body mass index is 48.72 kg/m.  Wt Readings from Last 3 Encounters:  09/15/22 292 lb 12.8 oz (132.8 kg)  01/18/22 291 lb 12.8 oz (132.4 kg)  09/09/21 298 lb (135.2 kg)    Objective:  Physical Exam Vitals and nursing note reviewed.  Constitutional:      Appearance: Normal appearance. She is obese.  HENT:     Head: Normocephalic and atraumatic.     Right Ear: Tympanic membrane, ear canal and external ear normal.     Left Ear: Tympanic membrane, ear canal and external ear normal.     Nose: Nose normal.     Mouth/Throat:     Mouth: Mucous membranes are moist.     Pharynx: Oropharynx is clear.  Eyes:     Extraocular Movements: Extraocular movements intact.     Conjunctiva/sclera: Conjunctivae normal.     Pupils: Pupils are equal, round, and reactive to light.  Cardiovascular:     Rate and Rhythm: Normal rate and regular rhythm.     Pulses: Normal pulses.     Heart sounds: Normal heart sounds.  Pulmonary:     Effort: Pulmonary effort is normal.     Breath sounds: Normal breath sounds.  Chest:  Breasts:     Tanner Score is 5.     Right: Normal.     Left: Normal.  Abdominal:     General: Bowel sounds are normal.     Palpations: Abdomen is soft.     Comments: Obese, soft  Genitourinary:    Comments: deferred Musculoskeletal:        General: Normal range of motion.     Cervical back: Normal range of motion and neck supple.  Skin:    General: Skin is warm and dry.  Neurological:     General: No focal deficit present.     Mental Status: She is alert and oriented to person, place, and time.  Psychiatric:        Mood and Affect: Mood normal.  Behavior: Behavior normal.         Assessment And Plan:     1. Encounter for annual physical exam Comments: A full exam was performed. Importance of monthly sefl breast exams was discussed with the patient.  PATIENT IS ADVISED TO GET 30-45 MINUTES REGULAR EXERCISE NO LESS THAN FOUR TO FIVE DAYS PER WEEK - BOTH WEIGHTBEARING EXERCISES AND AEROBIC ARE RECOMMENDED.  PATIENT IS ADVISED TO FOLLOW A HEALTHY DIET WITH AT LEAST SIX FRUITS/VEGGIES PER DAY, DECREASE INTAKE OF RED MEAT, AND TO INCREASE FISH INTAKE TO TWO DAYS PER WEEK.  MEATS/FISH SHOULD NOT BE FRIED, BAKED OR BROILED IS PREFERABLE.  IT IS ALSO IMPORTANT TO CUT BACK ON YOUR SUGAR INTAKE. PLEASE AVOID ANYTHING WITH ADDED SUGAR, CORN SYRUP OR OTHER SWEETENERS. IF YOU MUST USE A SWEETENER, YOU CAN TRY STEVIA. IT IS ALSO IMPORTANT TO AVOID ARTIFICIALLY SWEETENERS AND DIET BEVERAGES. LASTLY, I SUGGEST WEARING SPF 50 SUNSCREEN ON EXPOSED PARTS AND ESPECIALLY WHEN IN THE DIRECT SUNLIGHT FOR AN EXTENDED PERIOD OF TIME.  PLEASE AVOID FAST FOOD RESTAURANTS AND INCREASE YOUR WATER INTAKE. - CBC - CMP14+EGFR - Lipid panel - Hemoglobin A1c  2. Primary hypertension Comments: Chronic, fair cointrol. She will c/w amlodipine 10mg  daily. EKG performed, NSR w/ nonspecific T abnormality. She will f/u in 4-6 months.  She is encouraged to follow a low sodium diet.  - POCT Urinalysis Dipstick (81002) -  Microalbumin / Creatinine Urine Ratio - EKG 12-Lead  3. Lung nodule seen on imaging study Comments: 8 mm right middle lobe subpleural nodule was initially noted on March 2022 CT abd/pelvis. - CT Chest Wo Contrast; Future  4. Vitamin D deficiency Comments: Previously low, I will recheck vitamin D level today and supplement as needed. - Vitamin D (25 hydroxy)  5. Class 3 severe obesity due to excess calories with serious comorbidity and body mass index (BMI) of 45.0 to 49.9 in adult [facility]) Comments: BMI 48   She is encouraged to aim for at least 150 minutes of exercise/week, while initially striving for BMI<40 to decrease cardiac risk.   Return for 1 year physical, 6 month bp. Patient was given opportunity to ask questions. Patient verbalized understanding of the plan and was able to repeat key elements of the plan. All questions were answered to their satisfaction.   I, Gwynneth Aliment, MD, have reviewed all documentation for this visit. The documentation on 09/15/22 for the exam, diagnosis, procedures, and orders are all accurate and complete.  THE PATIENT IS ENCOURAGED TO PRACTICE SOCIAL DISTANCING DUE TO THE COVID-19 PANDEMIC.

## 2022-09-15 NOTE — Patient Instructions (Addendum)
[facility]  Health Maintenance, Female Adopting a healthy lifestyle and getting preventive care are important in promoting health and wellness. Ask your health care provider about: The right schedule for you to have regular tests and exams. Things you can do on your own to prevent diseases and keep yourself healthy. What should I know about diet, weight, and exercise? Eat a healthy diet  Eat a diet that includes plenty of vegetables, fruits, low-fat dairy products, and lean protein. Do not eat a lot of foods that are high in solid fats, added sugars, or sodium. Maintain a healthy weight Body mass index (BMI) is used to identify weight problems. It estimates body fat based on height and weight. Your health care provider can help determine your BMI and help you achieve or maintain a healthy weight. Get regular exercise Get regular exercise. This is one of the most important things you can do for your health. Most adults should: Exercise for at least 150 minutes each week. The exercise should increase your heart rate and make you sweat (moderate-intensity exercise). Do strengthening exercises at least twice a week. This is in addition to the moderate-intensity exercise. Spend less time sitting. Even light physical activity can be beneficial. Watch cholesterol and blood lipids Have your blood tested for lipids and cholesterol at 49 years of age, then have this test every 5 years. Have your cholesterol levels checked more often if: Your lipid or cholesterol levels are high. You are older than 49 years of age. You are at high risk for heart disease. What should I know about cancer screening? Depending on your health history and family history, you may need to have cancer screening at various ages. This may include screening for: Breast cancer. Cervical cancer. Colorectal cancer. Skin cancer. Lung cancer. What should I know about heart disease, diabetes, and high blood pressure? Blood  pressure and heart disease High blood pressure causes heart disease and increases the risk of stroke. This is more likely to develop in people who have high blood pressure readings or are overweight. Have your blood pressure checked: Every 3-5 years if you are 68-79 years of age. Every year if you are 8 years old or older. Diabetes Have regular diabetes screenings. This checks your fasting blood sugar level. Have the screening done: Once every three years after age 27 if you are at a normal weight and have a low risk for diabetes. More often and at a younger age if you are overweight or have a high risk for diabetes. What should I know about preventing infection? Hepatitis B If you have a higher risk for hepatitis B, you should be screened for this virus. Talk with your health care provider to find out if you are at risk for hepatitis B infection. Hepatitis C Testing is recommended for: Everyone born from [birth year] through 1967. Anyone with known risk factors for hepatitis C. Sexually transmitted infections (STIs) Get screened for STIs, including gonorrhea and chlamydia, if: You are sexually active and are younger than 49 years of age. You are older than 49 years of age and your health care provider tells you that you are at risk for this type of infection. Your sexual activity has changed since you were last screened, and you are at increased risk for chlamydia or gonorrhea. Ask your health care provider if you are at risk. Ask your health care provider about whether you are at high risk for HIV. Your health care provider may recommend a prescription medicine to  help prevent HIV infection. If you choose to take medicine to prevent HIV, you should first get tested for HIV. You should then be tested every 3 months for as long as you are taking the medicine. Pregnancy If you are about to stop having your period (premenopausal) and you may become pregnant, seek counseling before you get  pregnant. Take 400 to 800 micrograms (mcg) of folic acid every day if you become pregnant. Ask for birth control (contraception) if you want to prevent pregnancy. Osteoporosis and menopause Osteoporosis is a disease in which the bones lose minerals and strength with aging. This can result in bone fractures. If you are 7 years old or older, or if you are at risk for osteoporosis and fractures, ask your health care provider if you should: Be screened for bone loss. Take a calcium or vitamin D supplement to lower your risk of fractures. Be given hormone replacement therapy (HRT) to treat symptoms of menopause. Follow these instructions at home: Alcohol use Do not drink alcohol if: Your health care provider tells you not to drink. You are pregnant, may be pregnant, or are planning to become pregnant. If you drink alcohol: Limit how much you have to: 0-1 drink a day. Know how much alcohol is in your drink. In the [place] one drink equals one 12 oz bottle of beer (355 mL), one 5 oz glass of wine (148 mL), or one 1 oz glass of hard liquor (44 mL). Lifestyle Do not use any products that contain nicotine or tobacco. These products include cigarettes, chewing tobacco, and vaping devices, such as e-cigarettes. If you need help quitting, ask your health care provider. Do not use street drugs. Do not share needles. Ask your health care provider for help if you need support or information about quitting drugs. General instructions Schedule regular health, dental, and eye exams. Stay current with your vaccines. Tell your health care provider if: You often feel depressed. You have ever been abused or do not feel safe at home. Summary Adopting a healthy lifestyle and getting preventive care are important in promoting health and wellness. Follow your health care provider's instructions about healthy diet, exercising, and getting tested or screened for diseases. Follow your health care provider's  instructions on monitoring your cholesterol and blood pressure. This information is not intended to replace advice given to you by your health care provider. Make sure you discuss any questions you have with your health care provider. Document Revised: 08/18/2020 Document Reviewed: 08/18/2020 Elsevier Patient Education  2024 [employer].

## 2022-09-16 LAB — CMP14+EGFR
ALT: 14 IU/L (ref 0–32)
AST: 17 IU/L (ref 0–40)
Albumin/Globulin Ratio: 1.3 (ref 1.2–2.2)
Albumin: 4.4 g/dL (ref 3.9–4.9)
Alkaline Phosphatase: 63 IU/L (ref 44–121)
BUN/Creatinine Ratio: 22 (ref 9–23)
BUN: 12 mg/dL (ref 6–24)
Bilirubin Total: 0.3 mg/dL (ref 0.0–1.2)
CO2: 21 mmol/L (ref 20–29)
Calcium: 9.2 mg/dL (ref 8.7–10.2)
Chloride: 102 mmol/L (ref 96–106)
Creatinine, Ser: 0.54 mg/dL — ABNORMAL LOW (ref 0.57–1.00)
Globulin, Total: 3.3 g/dL (ref 1.5–4.5)
Glucose: 78 mg/dL (ref 70–99)
Potassium: 4.3 mmol/L (ref 3.5–5.2)
Sodium: 138 mmol/L (ref 134–144)
Total Protein: 7.7 g/dL (ref 6.0–8.5)
eGFR: 113 mL/min/{1.73_m2} (ref >59)

## 2022-09-16 LAB — LIPID PANEL
Chol/HDL Ratio: 2.2 ratio (ref 0.0–4.4)
Cholesterol, Total: 175 mg/dL (ref 100–199)
HDL: 78 mg/dL (ref >39)
LDL Chol Calc (NIH): 89 mg/dL (ref 0–99)
Triglycerides: 37 mg/dL (ref 0–149)
VLDL Cholesterol Cal: 8 mg/dL (ref 5–40)

## 2022-09-16 LAB — MICROALBUMIN / CREATININE URINE RATIO
Creatinine, Urine: 61.3 mg/dL
Microalb/Creat Ratio: 8 mg/g creat (ref 0–29)
Microalbumin, Urine: 4.6 ug/mL

## 2022-09-16 LAB — CBC
Hematocrit: 42.1 % (ref 34.0–46.6)
Hemoglobin: 13.6 g/dL (ref 11.1–15.9)
MCH: 28.7 pg (ref 26.6–33.0)
MCHC: 32.3 g/dL (ref 31.5–35.7)
MCV: 89 fL (ref 79–97)
Platelets: 291 10*3/uL (ref 150–450)
RBC: 4.74 x10E6/uL (ref 3.77–5.28)
RDW: 12.9 % (ref 11.7–15.4)
WBC: 4.3 10*3/uL (ref 3.4–10.8)

## 2022-09-16 LAB — HEMOGLOBIN A1C
Est. average glucose Bld gHb Est-mCnc: 128 mg/dL
Hgb A1c MFr Bld: 6.1 % — ABNORMAL HIGH (ref 4.8–5.6)

## 2022-09-16 LAB — VITAMIN D 25 HYDROXY (VIT D DEFICIENCY, FRACTURES): Vit D, 25-Hydroxy: 41.8 ng/mL (ref 30.0–100.0)

## 2022-09-22 ENCOUNTER — Other Ambulatory Visit: Payer: No Typology Code available for payment source

## 2022-09-29 ENCOUNTER — Other Ambulatory Visit: Payer: No Typology Code available for payment source

## 2022-10-06 ENCOUNTER — Ambulatory Visit: Payer: No Typology Code available for payment source | Admitting: Obstetrics & Gynecology

## 2022-10-06 ENCOUNTER — Other Ambulatory Visit: Payer: Self-pay | Admitting: Podiatry

## 2022-10-06 ENCOUNTER — Encounter: Payer: Self-pay | Admitting: Obstetrics & Gynecology

## 2022-10-06 VITALS — BP 116/66 | HR 68 | Wt 291.0 lb

## 2022-10-06 DIAGNOSIS — D251 Intramural leiomyoma of uterus: Secondary | ICD-10-CM

## 2022-10-06 DIAGNOSIS — Z1231 Encounter for screening mammogram for malignant neoplasm of breast: Secondary | ICD-10-CM | POA: Diagnosis not present

## 2022-10-06 DIAGNOSIS — Z01419 Encounter for gynecological examination (general) (routine) without abnormal findings: Secondary | ICD-10-CM

## 2022-10-06 DIAGNOSIS — Z1339 Encounter for screening examination for other mental health and behavioral disorders: Secondary | ICD-10-CM

## 2022-10-06 NOTE — Progress Notes (Signed)
Subjective:     Amanda Paul is a 47 y.o. female here for a routine exam.  Current complaints: Pt is s/p UFE in 09/2020. She was having some bleeding initially but, up unitl last month, she didn't have bleeding for 6 months. Now her bleeding is back to normal. Pt reports that she recently got a promotion at work.        Gynecologic History Patient's last menstrual period was 09/08/2022 (exact date). Contraception: abstinence Last Pap: 06/24/2021. Results were: normal Last mammogram: 12/202/2023. Results were: normal  Obstetric History OB History  Gravida Para Term Preterm AB Living  0 0 0 0 0 0  SAB IAB Ectopic Multiple Live Births  0 0 0 0       The following portions of the patient's history were reviewed and updated as appropriate: allergies, current medications, past family history, past medical history, past social history, past surgical history, and problem list.  Review of Systems Pertinent items are noted in HPI.    Objective:  BP 116/66   Pulse 68   Wt 291 lb (132 kg)   LMP 09/08/2022 (Exact Date)   BMI 48.42 kg/m   General Appearance:    Alert, cooperative, no distress, appears stated age  Head:    Normocephalic, without obvious abnormality, atraumatic  Eyes:    conjunctiva/corneas clear, EOM's intact, both eyes  Ears:    Normal external ear canals, both ears  Nose:   Nares normal, septum midline, mucosa normal, no drainage    or sinus tenderness  Throat:   Lips, mucosa, and tongue normal; teeth and gums normal  Neck:   Supple, symmetrical, trachea midline, no adenopathy;    thyroid:  no enlargement/tenderness/nodules  Back:     Symmetric, no curvature, ROM normal, no CVA tenderness  Lungs:     respirations unlabored  Chest Wall:    No tenderness or deformity   Heart:    Regular rate and rhythm  Breast Exam:    No tenderness, masses, or nipple abnormality  Abdomen:     Soft, non-tender, bowel sounds active all four quadrants,    no masses, no  organomegaly; enlarged uterus @ umbilicus  Genitalia:    Normal female without lesion, discharge or tenderness     Extremities:   Extremities normal, atraumatic, no cyanosis or edema  Pulses:   2+ and symmetric all extremities  Skin:   Skin color, texture, turgor normal, no rashes or lesions     Assessment:    Healthy female exam.    Plan:   Amanda Paul was seen today for gynecologic exam.  Diagnoses and all orders for this visit:  Well female exam with routine gynecological exam  Breast cancer screening by mammogram -     MM 3D SCREENING MAMMOGRAM BILATERAL BREAST; Future  Fibroids, intramural   F/u in 1 year or sooner prn   Wendee Hata L. Harraway-Smith, M.D., [place]

## 2022-10-19 ENCOUNTER — Other Ambulatory Visit: Payer: No Typology Code available for payment source

## 2022-11-05 ENCOUNTER — Encounter: Payer: Self-pay | Admitting: Internal Medicine

## 2022-11-08 ENCOUNTER — Other Ambulatory Visit: Payer: No Typology Code available for payment source

## 2022-11-09 ENCOUNTER — Other Ambulatory Visit: Payer: No Typology Code available for payment source

## 2022-11-22 ENCOUNTER — Other Ambulatory Visit: Payer: No Typology Code available for payment source

## 2022-12-03 ENCOUNTER — Encounter: Payer: Self-pay | Admitting: Internal Medicine

## 2022-12-06 ENCOUNTER — Encounter: Payer: Self-pay | Admitting: Podiatry

## 2022-12-06 MED ORDER — MELOXICAM 15 MG PO TABS: 15.0000 mg | ORAL_TABLET | Freq: Every day | ORAL | 3 refills | Status: DC

## 2022-12-06 NOTE — Addendum Note (Signed)
Addended by: Kristian Covey on: 12/06/2022 05:02 PM   Modules accepted: Orders

## 2022-12-08 ENCOUNTER — Ambulatory Visit
Admission: RE | Admit: 2022-12-08 | Discharge: 2022-12-08 | Disposition: A | Discharge status: Home / Self Care | Payer: No Typology Code available for payment source | Source: Ambulatory Visit | Attending: Internal Medicine | Admitting: Internal Medicine

## 2022-12-08 DIAGNOSIS — R911 Solitary pulmonary nodule: Secondary | ICD-10-CM

## 2022-12-20 ENCOUNTER — Other Ambulatory Visit: Payer: Self-pay | Admitting: Internal Medicine

## 2022-12-20 ENCOUNTER — Encounter: Payer: Self-pay | Admitting: Internal Medicine

## 2022-12-21 ENCOUNTER — Other Ambulatory Visit: Payer: Self-pay

## 2022-12-21 MED ORDER — ROSUVASTATIN CALCIUM 10 MG PO TABS: 10.0000 mg | ORAL_TABLET | Freq: Every day | ORAL | 3 refills | Status: DC

## 2022-12-22 ENCOUNTER — Other Ambulatory Visit: Payer: Self-pay | Admitting: Internal Medicine

## 2022-12-22 DIAGNOSIS — I2510 Atherosclerotic heart disease of native coronary artery without angina pectoris: Secondary | ICD-10-CM

## 2023-01-12 ENCOUNTER — Ambulatory Visit: Payer: No Typology Code available for payment source | Admitting: Cardiovascular Disease

## 2023-01-17 ENCOUNTER — Ambulatory Visit (INDEPENDENT_AMBULATORY_CARE_PROVIDER_SITE_OTHER): Payer: No Typology Code available for payment source | Admitting: Podiatry

## 2023-01-17 DIAGNOSIS — M25572 Pain in left ankle and joints of left foot: Secondary | ICD-10-CM

## 2023-01-17 DIAGNOSIS — M19072 Primary osteoarthritis, left ankle and foot: Secondary | ICD-10-CM | POA: Diagnosis not present

## 2023-01-17 MED ORDER — TRIAMCINOLONE ACETONIDE 10 MG/ML IJ SUSP
10.0000 mg | Freq: Once | INTRAMUSCULAR | Status: AC
Start: 2023-01-17 — End: 2023-01-17
  Administered 2023-01-17: 10 mg via INTRA_ARTICULAR

## 2023-01-17 NOTE — Progress Notes (Signed)
Chief Complaint  Patient presents with   Foot Pain    Pain in the left foot- she has been working long days-  tender and would like an injection.  (Sinus tarsi)     HPI: 47 y.o. female presents today with pain along the anterolateral left ankle area.  Denies injury.  States it's been swollen and painful.  Denies bruising.  Notes she got a cortisone injection in this area from Dr. Al Corpus in the past.  Past Medical History:  Diagnosis Date   Allergy    Arthritis    Hernia, umbilical    Morbid obesity [facility])     Past Surgical History:  Procedure Laterality Date   ADVANCEMENT / RECONSTRUCTION POSTERIOR TIBIAL TENDON / [place] Left 10/09/2021   COLONOSCOPY WITH PROPOFOL N/A 06/13/2020   Procedure: COLONOSCOPY WITH PROPOFOL;  Surgeon: Jeani Hawking, MD;  Location: [facility];  Service: Endoscopy;  Laterality: N/A;   HEMOSTASIS CLIP PLACEMENT  06/13/2020   Procedure: HEMOSTASIS CLIP PLACEMENT;  Surgeon: Jeani Hawking, MD;  Location: [facility];  Service: Endoscopy;;   HERNIA REPAIR     HYSTEROSCOPY WITH D & C N/A 08/26/2014   Procedure: DILATATION AND CURETTAGE /HYSTEROSCOPY and cervical repair of cervical laceration ;  Surgeon: Silverio Lay, MD;  Location: [facility];  Service: Gynecology;  Laterality: N/A;   IR ANGIOGRAM PELVIS SELECTIVE OR SUPRASELECTIVE  10/08/2020   IR ANGIOGRAM PELVIS SELECTIVE OR SUPRASELECTIVE  10/08/2020   IR ANGIOGRAM SELECTIVE EACH ADDITIONAL VESSEL  10/08/2020   IR EMBO TUMOR ORGAN ISCHEMIA INFARCT INC GUIDE ROADMAPPING  10/08/2020   IR RADIOLOGIST EVAL & MGMT  07/10/2020   IR RADIOLOGIST EVAL & MGMT  07/31/2020   IR RADIOLOGIST EVAL & MGMT  09/09/2020   IR RADIOLOGIST EVAL & MGMT  11/18/2020   IR RADIOLOGIST EVAL & MGMT  09/14/2021   IR US GUIDE VASC ACCESS RIGHT  10/08/2020   POLYPECTOMY  06/13/2020   Procedure: POLYPECTOMY;  Surgeon: Jeani Hawking, MD;  Location: [facility];  Service: Endoscopy;;   UMBILICAL HERNIA REPAIR     WISDOM TOOTH  EXTRACTION      Allergies  Allergen Reactions   Amoxicillin Hives, Itching and Swelling    Swollen eyes and throat.   Penicillins Hives and Swelling   Review of Systems  Musculoskeletal:  Positive for joint pain.     Physical Exam: General: The patient is alert and oriented x3 in no acute distress.  Dermatology: Skin is warm, dry and supple bilateral lower extremities. Interspaces are clear of maceration and debris.    Vascular: Palpable pedal pulses bilaterally. Capillary refill within normal limits.  Localized edema to dorsal - dorsolateral midfoot/ankle area left extremity  Neurological: Light touch sensation grossly intact bilateral feet.   Musculoskeletal Exam: Majority of pain is at the anterolateral left ankle gutter.  No crepitus with ROM of ankle.  Mild pain at left sinus tarsi joint.  Previous MRI of left ankle on 08/12/21 noted degenerative changes in ankle and STJ left.  Assessment/Plan of Care: 1. Pain in joint involving left ankle and foot   2. Arthritis of left ankle      Meds ordered this encounter  Medications   triamcinolone acetonide (KENALOG) 10 MG/ML injection 10 mg   Discussed clinical findings with patient today.  With the patient's verbal consent, a corticosteroid injection was adminstered to the anterolateral left ankle joint after a betadine joint prep on the skin.  This consisted of a mixture of 1% lidocaine  plain, 0.5% sensorcaine plain, and Kenalog-10 for a total of 1.25cc's administered.  Bandaid applied. Patient tolerated this well.   She was instructed to keep it covered for 24 hours.    F/u prn    Clerance Lav, DPM, FACFAS [facility]     [address] [place], [place] [address]                Office [phone]  Fax [phone]

## 2023-02-24 ENCOUNTER — Ambulatory Visit: Payer: No Typology Code available for payment source | Admitting: Cardiovascular Disease

## 2023-03-17 ENCOUNTER — Ambulatory Visit: Payer: No Typology Code available for payment source | Admitting: Cardiovascular Disease

## 2023-03-21 ENCOUNTER — Ambulatory Visit (INDEPENDENT_AMBULATORY_CARE_PROVIDER_SITE_OTHER): Payer: No Typology Code available for payment source | Admitting: Internal Medicine

## 2023-03-21 ENCOUNTER — Encounter: Payer: Self-pay | Admitting: Internal Medicine

## 2023-03-21 VITALS — BP 122/80 | HR 86 | Temp 98.1°F | Ht 65.0 in | Wt 292.0 lb

## 2023-03-21 DIAGNOSIS — I119 Hypertensive heart disease without heart failure: Secondary | ICD-10-CM

## 2023-03-21 DIAGNOSIS — R7303 Prediabetes: Secondary | ICD-10-CM | POA: Insufficient documentation

## 2023-03-21 DIAGNOSIS — I2510 Atherosclerotic heart disease of native coronary artery without angina pectoris: Secondary | ICD-10-CM | POA: Diagnosis not present

## 2023-03-21 DIAGNOSIS — E7800 Pure hypercholesterolemia, unspecified: Secondary | ICD-10-CM | POA: Insufficient documentation

## 2023-03-21 DIAGNOSIS — E66813 Obesity, class 3: Secondary | ICD-10-CM | POA: Insufficient documentation

## 2023-03-21 DIAGNOSIS — Z6841 Body Mass Index (BMI) 40.0 and over, adult: Secondary | ICD-10-CM

## 2023-03-21 DIAGNOSIS — R160 Hepatomegaly, not elsewhere classified: Secondary | ICD-10-CM | POA: Insufficient documentation

## 2023-03-21 HISTORY — DX: Prediabetes: R73.03

## 2023-03-21 NOTE — Assessment & Plan Note (Signed)
Seen on CT. Will schedule her for liver ultrasound.

## 2023-03-21 NOTE — Assessment & Plan Note (Signed)
Chronic, seen on CT chest for re-evaluation of lung nodules.  LDL goal is less than 70 due to coronary arterial calcifications. She was referred to Cardiology; however, her appt was cancelled and she was not assigned a new provider. Will place new referral.

## 2023-03-21 NOTE — Assessment & Plan Note (Addendum)
BMI 48.  She is encouraged to incorporate more exercise into her daily routine and to aim to lose ten percent of her body weight to decrease cardiac risk.

## 2023-03-21 NOTE — Assessment & Plan Note (Signed)
Previous labs reviewed, her A1c has been elevated in the past. I will check an A1c today. Reminded to avoid refined sugars including sugary drinks/foods and processed meats including bacon, sausages and deli meats.  

## 2023-03-21 NOTE — Assessment & Plan Note (Signed)
Chronic, controlled. She will continue with amlodipine 10mg  daily. Encouraged to follow low sodium diet. She will f/u in six months for full physical examination.

## 2023-03-21 NOTE — Assessment & Plan Note (Addendum)
Chronic, LDL goal is less than 70 due to coronary arterial calcifications. She will continue with rosuvastatin 10mg  daily. Encouraged to follow heart healthy lifestyle.

## 2023-03-21 NOTE — Progress Notes (Signed)
Amanda Paul, CMA,acting as a Neurosurgeon for Amanda Aliment, MD.,have documented all relevant documentation on the behalf of Amanda Aliment, MD,as directed by  Amanda Aliment, MD while in the presence of Amanda Aliment, MD.  Subjective:  Patient ID: Amanda Paul , female    DOB: 05/27/1975 , 47 y.o.   MRN: [id number]  Chief Complaint  Patient presents with   Hypertension   Hyperlipidemia    HPI  Patient presents today for a bp and prediabetes and chol check. Patient reports compliance with her meds. Patient doesn't have any concerns or questions at this time. She denies having any headaches, chest pain and shortness of breath. She admits she has not been exercising, states she works a lot of hours.   Hypertension This is a chronic problem. The current episode started more than 1 year ago. The problem has been gradually improving since onset. The problem is controlled. Pertinent negatives include no blurred vision or palpitations. Risk factors for coronary artery disease include dyslipidemia and obesity. Past treatments include calcium channel blockers.     Past Medical History:  Diagnosis Date   Allergy    Arthritis    Hernia, umbilical    Morbid obesity (HCC)      Family History  Problem Relation Age of Onset   Hypertension Mother      Current Outpatient Medications:    amLODipine (NORVASC) 10 MG tablet, TAKE 1 TABLET BY MOUTH EVERY DAY, Disp: 90 tablet, Rfl: 3   meloxicam (MOBIC) 15 MG tablet, Take 1 tablet (15 mg total) by mouth daily., Disp: 30 tablet, Rfl: 3   rosuvastatin (CRESTOR) 10 MG tablet, Take 1 tablet (10 mg total) by mouth daily., Disp: 90 tablet, Rfl: 3   Vitamin D, Ergocalciferol, (DRISDOL) 1.25 MG (50000 UNIT) CAPS capsule, Take 1 capsule (50,000 Units total) by mouth every 7 (seven) days., Disp: 12 capsule, Rfl: 1   Allergies  Allergen Reactions   Amoxicillin Hives, Itching and Swelling    Swollen eyes and throat.   Penicillins Hives  and Swelling     Review of Systems  Constitutional: Negative.   Eyes:  Negative for blurred vision.  Respiratory: Negative.    Cardiovascular: Negative.  Negative for palpitations.  Gastrointestinal: Negative.   Neurological: Negative.   Psychiatric/Behavioral: Negative.       Today's Vitals   03/21/23 0837  BP: 122/80  Pulse: 86  Temp: 98.1 F (36.7 C)  Weight: 292 lb (132.5 kg)  Height: 5\' 5"  (1.651 m)  PainSc: 0-No pain   Body mass index is 48.59 kg/m.  Wt Readings from Last 3 Encounters:  03/21/23 292 lb (132.5 kg)  10/06/22 291 lb (132 kg)  09/15/22 292 lb 12.8 oz (132.8 kg)    The 10-year ASCVD risk score (Arnett DK, et al., 2019) is: 1.4%   Values used to calculate the score:     Age: 36 years     Sex: Female     Is Non-Hispanic African American: Yes     Diabetic: No     Tobacco smoker: No     Systolic Blood Pressure: 122 mmHg     Is BP treated: Yes     HDL Cholesterol: 78 mg/dL     Total Cholesterol: 175 mg/dL  Objective:  Physical Exam Vitals and nursing note reviewed.  Constitutional:      Appearance: Normal appearance. She is obese.  HENT:     Head: Normocephalic and atraumatic.  Eyes:  Extraocular Movements: Extraocular movements intact.  Cardiovascular:     Rate and Rhythm: Normal rate and regular rhythm.     Heart sounds: Normal heart sounds.  Pulmonary:     Effort: Pulmonary effort is normal.     Breath sounds: Normal breath sounds.  Musculoskeletal:     Cervical back: Normal range of motion.  Skin:    General: Skin is warm.  Neurological:     General: No focal deficit present.     Mental Status: She is alert.  Psychiatric:        Mood and Affect: Mood normal.        Behavior: Behavior normal.         Assessment And Plan:  Hypertensive heart disease without heart failure Assessment & Plan: Chronic, controlled. She will continue with amlodipine 10mg  daily. Encouraged to follow low sodium diet. She will f/u in six months for  full physical examination.   Orders: -     CMP14+EGFR -     Lipid panel -     Ambulatory referral to Cardiology  Coronary artery calcification Assessment & Plan: Chronic, seen on CT chest for re-evaluation of lung nodules.  LDL goal is less than 70 due to coronary arterial calcifications. She was referred to Cardiology; however, her appt was cancelled and she was not assigned a new provider. Will place new referral.   Orders: -     Ambulatory referral to Cardiology  Pure hypercholesterolemia Assessment & Plan: Chronic, LDL goal is less than 70 due to coronary arterial calcifications. She will continue with rosuvastatin 10mg  daily. Encouraged to follow heart healthy lifestyle.   Orders: -     CMP14+EGFR -     Lipid panel -     Lipoprotein A (LPA) -     Ambulatory referral to Cardiology  Prediabetes Assessment & Plan: Previous labs reviewed, her A1c has been elevated in the past. I will check an A1c today. Reminded to avoid refined sugars including sugary drinks/foods and processed meats including bacon, sausages and deli meats.    Orders: -     CMP14+EGFR -     Hemoglobin A1c  Hepatomegaly Assessment & Plan: Seen on CT. Will schedule her for liver ultrasound.   Orders: -     US ABDOMEN LIMITED RUQ (LIVER/GB); Future  Class 3 severe obesity due to excess calories with serious comorbidity and body mass index (BMI) of 45.0 to 49.9 in adult [facility]) Assessment & Plan: BMI 48.  She is encouraged to incorporate more exercise into her daily routine and to aim to lose ten percent of her body weight to decrease cardiac risk.      Return if symptoms worsen or fail to improve, for Uncontrolled BP check-3/4 months.  Patient was given opportunity to ask questions. Patient verbalized understanding of the plan and was able to repeat key elements of the plan. All questions were answered to their satisfaction.    I, Amanda Aliment, MD, have reviewed all documentation for this visit.  The documentation on 03/21/23 for the exam, diagnosis, procedures, and orders are all accurate and complete.   IF YOU HAVE BEEN REFERRED TO A SPECIALIST, IT MAY TAKE 1-2 WEEKS TO SCHEDULE/PROCESS THE REFERRAL. IF YOU HAVE NOT HEARD FROM US/SPECIALIST IN TWO WEEKS, PLEASE GIVE [place] A CALL AT [phone].

## 2023-03-22 LAB — CMP14+EGFR
ALT: 21 [IU]/L (ref 0–32)
AST: 21 [IU]/L (ref 0–40)
Albumin: 4.5 g/dL (ref 3.9–4.9)
Alkaline Phosphatase: 77 [IU]/L (ref 44–121)
BUN/Creatinine Ratio: 30 — ABNORMAL HIGH (ref 9–23)
BUN: 19 mg/dL (ref 6–24)
Bilirubin Total: 0.3 mg/dL (ref 0.0–1.2)
CO2: 23 mmol/L (ref 20–29)
Calcium: 9.7 mg/dL (ref 8.7–10.2)
Chloride: 102 mmol/L (ref 96–106)
Creatinine, Ser: 0.64 mg/dL (ref 0.57–1.00)
Globulin, Total: 3.9 g/dL (ref 1.5–4.5)
Glucose: 72 mg/dL (ref 70–99)
Potassium: 4.3 mmol/L (ref 3.5–5.2)
Sodium: 142 mmol/L (ref 134–144)
Total Protein: 8.4 g/dL (ref 6.0–8.5)
eGFR: 108 mL/min/{1.73_m2} (ref >59)

## 2023-03-22 LAB — HEMOGLOBIN A1C
Est. average glucose Bld gHb Est-mCnc: 131 mg/dL
Hgb A1c MFr Bld: 6.2 % — ABNORMAL HIGH (ref 4.8–5.6)

## 2023-03-22 LAB — LIPID PANEL
Chol/HDL Ratio: 2.3 {ratio} (ref 0.0–4.4)
Cholesterol, Total: 187 mg/dL (ref 100–199)
HDL: 82 mg/dL (ref >39)
LDL Chol Calc (NIH): 97 mg/dL (ref 0–99)
Triglycerides: 37 mg/dL (ref 0–149)
VLDL Cholesterol Cal: 8 mg/dL (ref 5–40)

## 2023-03-22 LAB — LIPOPROTEIN A (LPA): Lipoprotein (a): 299.5 nmol/L — ABNORMAL HIGH (ref <75.0)

## 2023-03-28 ENCOUNTER — Other Ambulatory Visit: Payer: No Typology Code available for payment source

## 2023-03-29 ENCOUNTER — Other Ambulatory Visit: Payer: No Typology Code available for payment source

## 2023-03-31 ENCOUNTER — Ambulatory Visit
Admission: RE | Admit: 2023-03-31 | Discharge: 2023-03-31 | Disposition: A | Discharge status: Home / Self Care | Payer: No Typology Code available for payment source | Source: Ambulatory Visit | Attending: Internal Medicine | Admitting: Internal Medicine

## 2023-03-31 DIAGNOSIS — R160 Hepatomegaly, not elsewhere classified: Secondary | ICD-10-CM

## 2023-04-01 ENCOUNTER — Other Ambulatory Visit: Payer: No Typology Code available for payment source

## 2023-04-07 ENCOUNTER — Ambulatory Visit
Admission: RE | Admit: 2023-04-07 | Discharge: 2023-04-07 | Disposition: A | Discharge status: Home / Self Care | Payer: No Typology Code available for payment source | Source: Ambulatory Visit | Attending: Obstetrics & Gynecology | Admitting: Obstetrics & Gynecology

## 2023-04-07 DIAGNOSIS — Z1231 Encounter for screening mammogram for malignant neoplasm of breast: Secondary | ICD-10-CM

## 2023-04-12 ENCOUNTER — Other Ambulatory Visit: Payer: Self-pay | Admitting: Podiatry

## 2023-05-13 ENCOUNTER — Encounter: Payer: Self-pay | Admitting: Obstetrics & Gynecology

## 2023-06-24 ENCOUNTER — Other Ambulatory Visit: Payer: Self-pay | Admitting: Internal Medicine

## 2023-06-26 IMAGING — MR MR ANKLE*L* W/O CM
5 series · 35 of 40 positions shown · non-contrast
Comparison: Left foot radiographs 07/28/2021 left ankle and foot
radiographs 07/31/2019

CLINICAL DATA: Acute pain. Tendon abnormality suspected. Evaluate
posterior tibial tendon for tear.

EXAM:
MRI OF THE LEFT ANKLE WITHOUT CONTRAST
TECHNIQUE: Multiplanar, multisequence MR imaging of the ankle was performed. No
intravenous contrast was administered.

[Series 4: T2 fat-sat · axial · 3.0mm · 0.50mm/px · z∈[-71,+50]mm · 9 of 32 slices shown (1 of 2)]
[im 1/32]
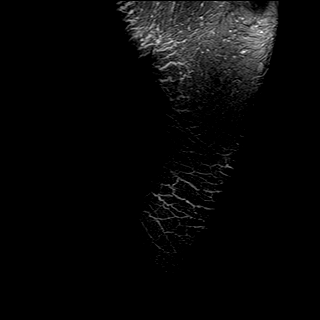
[im 4/32]
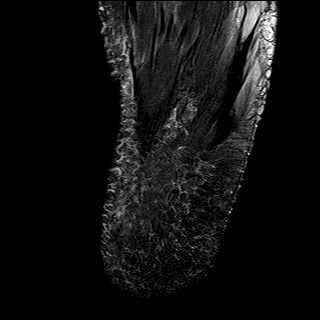
[im 8/32]
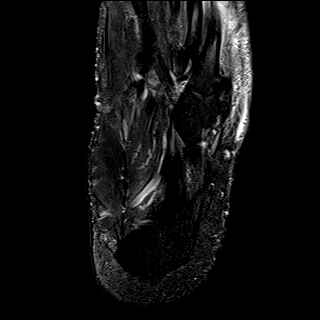
[im 12/32]
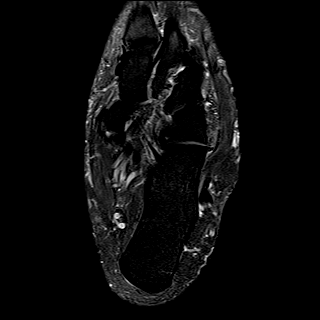
[im 16/32]
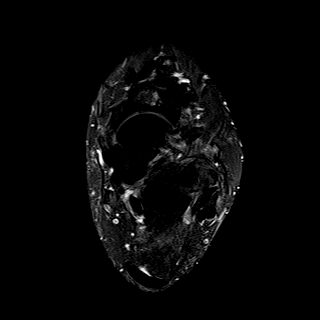
[im 20/32]
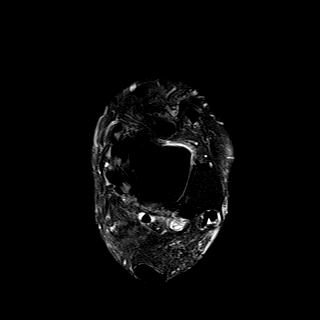
[im 24/32]
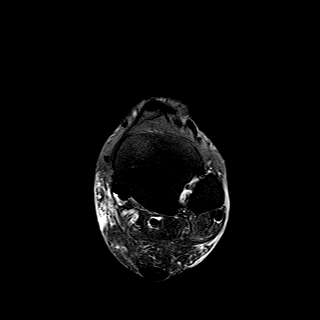
[im 28/32]
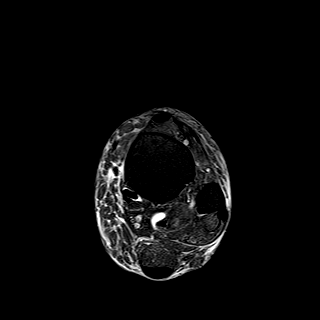
[im 32/32]
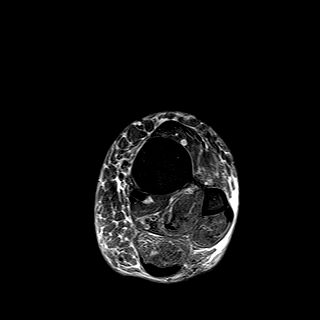

[Series 5: PD fat-sat · axial · 3.0mm · 0.42mm/px · z∈[-71,+50]mm · 9 of 32 slices shown]
[im 1/32]
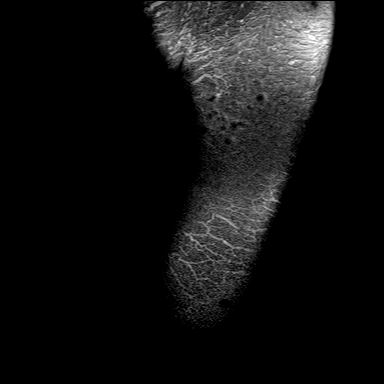
[im 4/32]
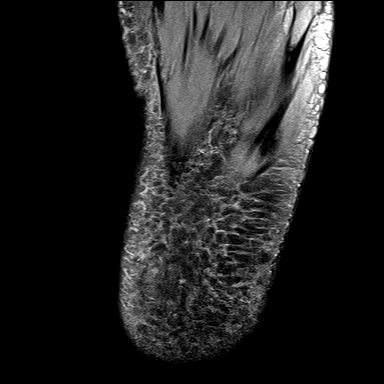
[im 8/32]
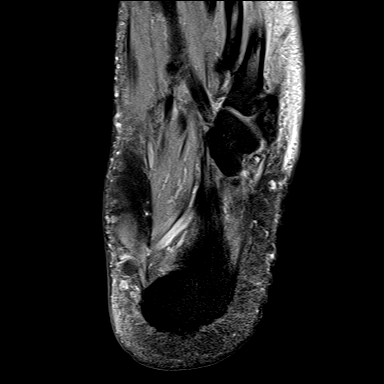
[im 12/32]
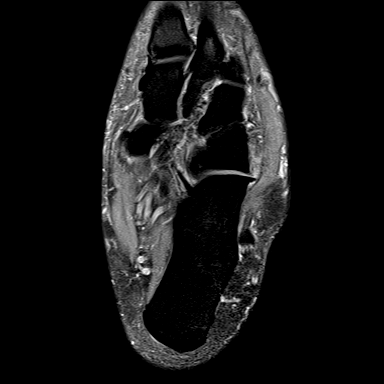
[im 16/32]
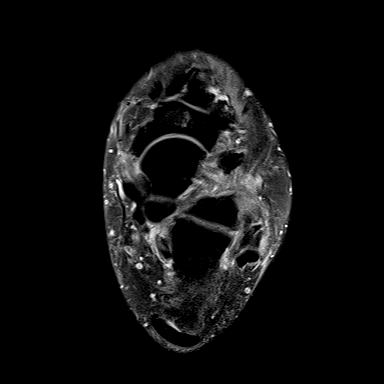
[im 20/32]
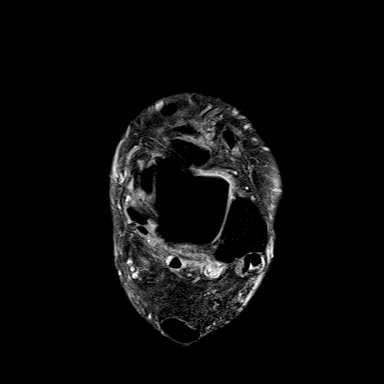
[im 24/32]
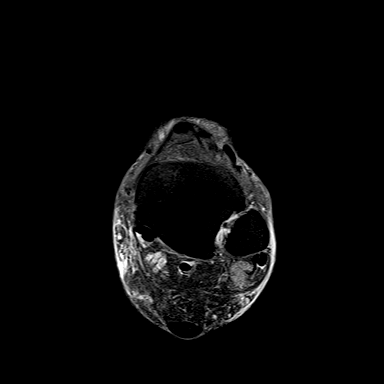
[im 28/32]
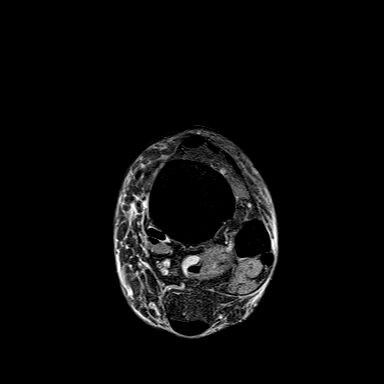
[im 32/32]
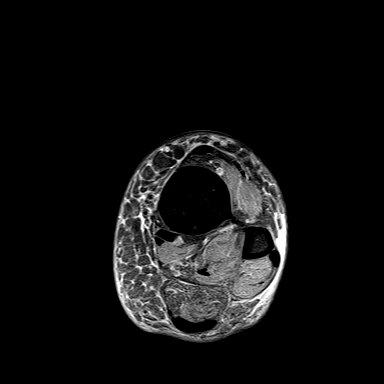

[Series 6: T1 · sagittal · 4.0mm · 0.56mm/px · 6 of 20 slices shown]
[im 1/20]
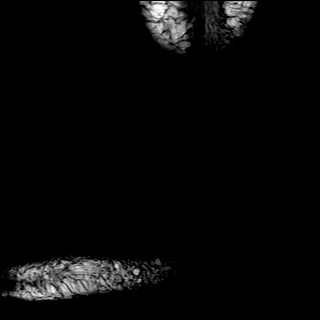
[im 4/20]
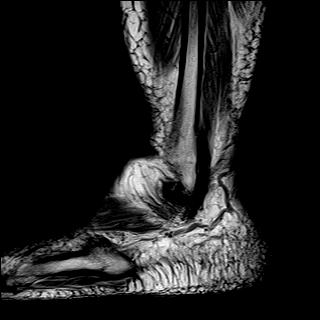
[im 8/20]
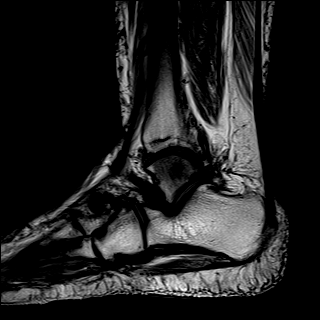
[im 12/20]
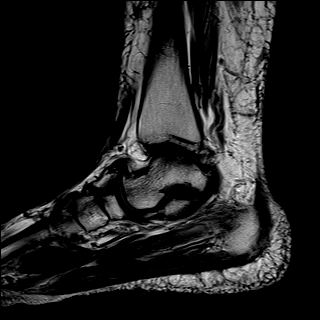
[im 16/20]
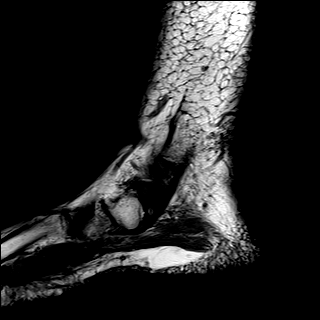
[im 20/20]
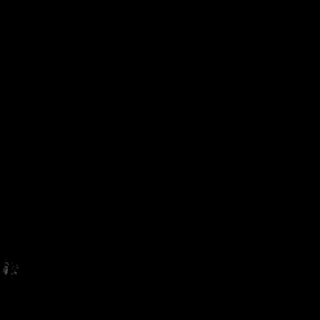

[Series 7: STIR · sagittal · 4.0mm · 0.35mm/px · 3 of 20 slices shown]
[im 1/20]
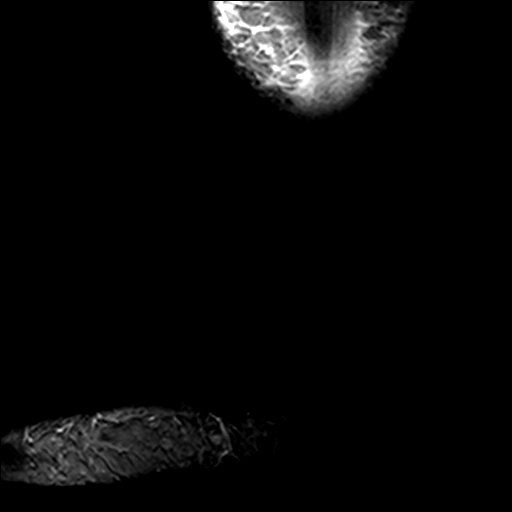
[im 4/20]
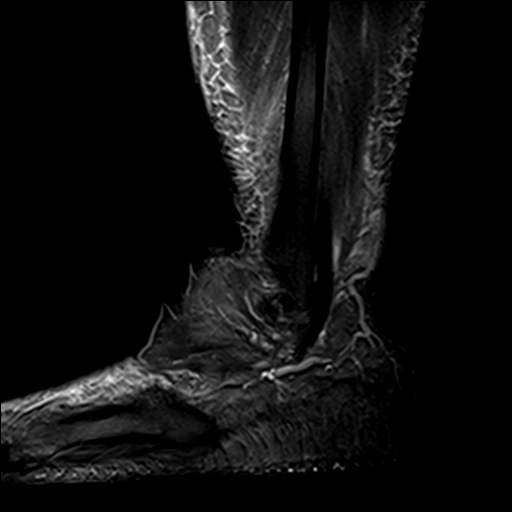
[im 8/20]
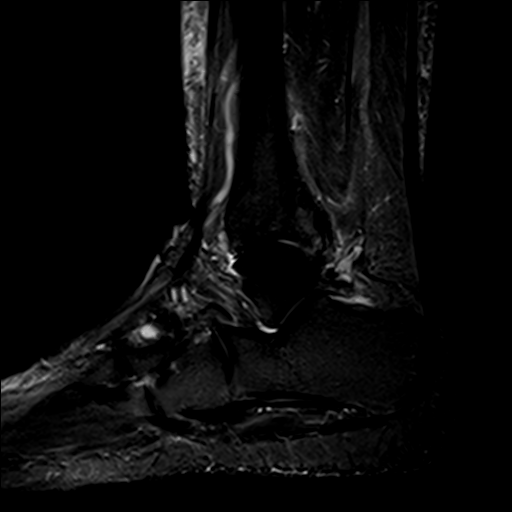

[Series 8: T2 fat-sat · coronal · 3.0mm · 0.50mm/px · 8 of 35 slices shown (2 of 2)]
[im 1/35]
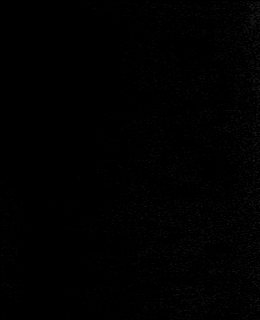
[im 4/35]
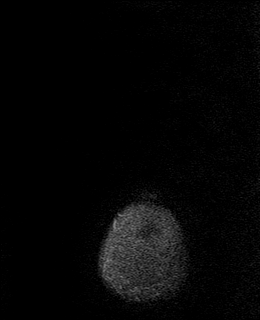
[im 12/35]
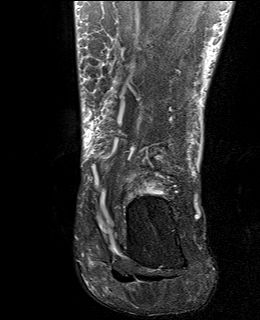
[im 16/35]
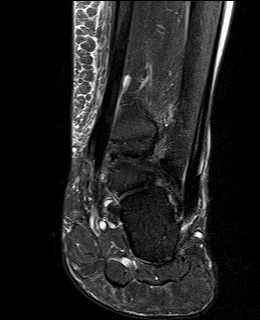
[im 19/35]
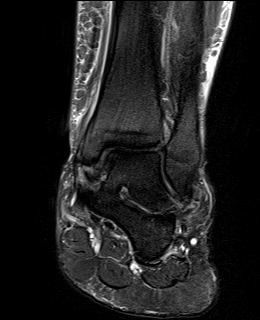
[im 23/35]
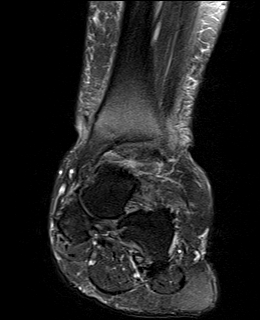
[im 31/35]
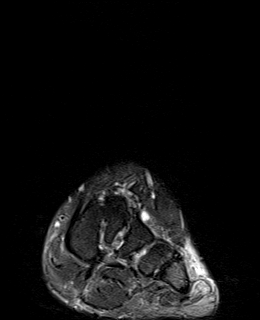
[im 35/35]
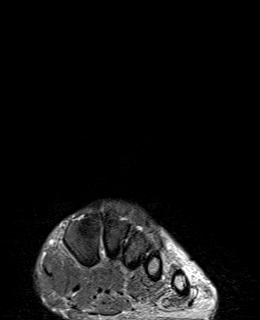

[35 of 40 positions shown; findings below may reference images not displayed]

FINDINGS: TENDONS

Peroneal: Mild peroneus longus and brevis tenosynovitis. There is a
"Nevilson configuration" of the peroneus brevis tendon indicating a
longitudinal partial-thickness split tear starting at the distal
aspect of the fibula and involving an approximate 1.5 cm length of
the tendon (axial series 4, images 17 through 22). The peroneus
longus tendon is intact.

Posteromedial: Mild posterior tibial tenosynovitis. Mild widening of
the tendon and intermediate T2 signal at the tendon insertion
indicating mild tendinosis. There is also trace fluid bright signal
within the medial 50% of the tendon insertion on the navicular
tuberosity (axial series 4, image 20), a small partial-thickness
tear without tendon retraction. There is a large medial navicular
tuberosity suggesting a type 3 os naviculare (cornuate navicular).
The flexor digitorum longus and flexor hallucis longus tendons are
intact.

Anterior: The tibialis anterior, extensor hallucis longus, and
extensor digitorum longus tendons are intact.

Achilles: Intact.

Plantar Fascia: Intact.

LIGAMENTS

Lateral: Mild attenuation of the anterior talofibular ligament with
intermediate T2 signal likely remote partial-thickness tear and
sprain. The calcaneofibular, posterior talofibular and anterior and
posterior tibiofibular ligaments are intact.

Medial: Mild intermediate T2 signal within the tibiotalar deep
deltoid ligament, likely chronic sprain. The tibial spring ligament
appears intact.

CARTILAGE

Ankle Joint: Moderate thinning of the anterior and posterior
tibiotalar cartilage with mild distal anterior tibial plafond
degenerative spurring.

Subtalar Joints/Sinus Tarsi: Moderate thinning and peripheral
osteophytosis osteoarthritis of the posterior subtalar joint. Normal
variant os trigonum without significant marrow edema. There is loss
of fat within the sinus tarsi as can be seen with scarring/chronic
sinus tarsi syndrome.

Bones: Moderate joint space narrowing and dorsal degenerative
osteophytes at the talonavicular joint with moderate subchondral
cystic change within the mid transverse dimension of the navicular.
Large distal dorsal talar degenerative osteophyte. Additional
moderate navicular-cuneiform, tarsometatarsal, and calcaneocuboid
osteoarthritis.

Other: The tarsal tunnel is unremarkable. The Lisfranc ligament
complex is intact.
IMPRESSION: :
IMPRESSION: 1. Mild posterior tibial tenosynovitis and distal insertional
tendinosis. Thin linear partial-thickness tear involving the medial
50% of the distal tendon insertion on the navicular tuberosity. The
navicular tuberosity is large suggesting a type 3 os naviculare
(cornuate navicular) normal variant.
2. Partial-thickness longitudinal split tear of the peroneus brevis
tendon.
3. Remote partial-thickness tear and sprain of the anterior
talofibular ligament.
4. Mild-to-moderate tibiotalar and moderate midfoot osteoarthritis,
as above.
5. Scarring within the sinus tarsi as can be seen with chronic sinus
tarsi syndrome.

## 2023-06-29 ENCOUNTER — Encounter: Payer: No Typology Code available for payment source | Admitting: Obstetrics & Gynecology

## 2023-06-30 NOTE — Progress Notes (Signed)
 Pt not seen.

## 2023-07-01 ENCOUNTER — Encounter: Payer: Self-pay | Admitting: Internal Medicine

## 2023-07-04 ENCOUNTER — Encounter: Payer: Self-pay | Admitting: Cardiology

## 2023-07-04 ENCOUNTER — Other Ambulatory Visit: Payer: Self-pay | Admitting: Internal Medicine

## 2023-07-04 ENCOUNTER — Ambulatory Visit: Payer: No Typology Code available for payment source | Attending: Cardiology | Admitting: Cardiology

## 2023-07-04 VITALS — BP 132/94 | HR 84 | Ht 65.0 in | Wt 288.2 lb

## 2023-07-04 DIAGNOSIS — E785 Hyperlipidemia, unspecified: Secondary | ICD-10-CM

## 2023-07-04 DIAGNOSIS — I10 Essential (primary) hypertension: Secondary | ICD-10-CM

## 2023-07-04 DIAGNOSIS — I2510 Atherosclerotic heart disease of native coronary artery without angina pectoris: Secondary | ICD-10-CM | POA: Diagnosis not present

## 2023-07-04 DIAGNOSIS — I119 Hypertensive heart disease without heart failure: Secondary | ICD-10-CM | POA: Diagnosis not present

## 2023-07-04 DIAGNOSIS — Z860109 Personal history of other colon polyps: Secondary | ICD-10-CM

## 2023-07-04 NOTE — Patient Instructions (Addendum)
 Medication Instructions:  Your physician recommends that you continue on your current medications as directed. Please refer to the Current Medication list given to you today.  *If you need a refill on your cardiac medications before your next appointment, please call your pharmacy*   Lab Work: Lipids, Lp(a) If you have labs (blood work) drawn today and your tests are completely normal, you will receive your results only by: MyChart Message (if you have MyChart) OR A paper copy in the mail If you have any lab test that is abnormal or we need to change your treatment, we will call you to review the results.   Testing/Procedures: Your physician has requested that you have an echocardiogram. Echocardiography is a painless test that uses sound waves to create images of your heart. It provides your doctor with information about the size and shape of your heart and how well your heart's chambers and valves are working. This procedure takes approximately one hour. There are no restrictions for this procedure. Please do NOT wear cologne, perfume, aftershave, or lotions (deodorant is allowed). Please arrive 15 minutes prior to your appointment time.  Please note: We ask at that you not bring children with you during ultrasound (echo/ vascular) testing. Due to room size and safety concerns, children are not allowed in the ultrasound rooms during exams. Our front office staff cannot provide observation of children in our lobby area while testing is being conducted. An adult accompanying a patient to their appointment will only be allowed in the ultrasound room at the discretion of the ultrasound technician under special circumstances. We apologize for any inconvenience.  Dr. Servando Salina has ordered a CT coronary calcium score.   Test locations:  [facility]  [facility]  Imaging at [facility]  This is $99 out of pocket.   Coronary  CalciumScan A coronary calcium scan is an imaging test used to look for deposits of calcium and other fatty materials (plaques) in the inner lining of the blood vessels of the heart (coronary arteries). These deposits of calcium and plaques can partly clog and narrow the coronary arteries without producing any symptoms or warning signs. This puts a person at risk for a heart attack. This test can detect these deposits before symptoms develop. Tell a health care provider about: Any allergies you have. All medicines you are taking, including vitamins, herbs, eye drops, creams, and over-the-counter medicines. Any problems you or family members have had with anesthetic medicines. Any blood disorders you have. Any surgeries you have had. Any medical conditions you have. Whether you are pregnant or may be pregnant. What are the risks? Generally, this is a safe procedure. However, problems may occur, including: Harm to a pregnant woman and her unborn baby. This test involves the use of radiation. Radiation exposure can be dangerous to a pregnant woman and her unborn baby. If you are pregnant, you generally should not have this procedure done. Slight increase in the risk of cancer. This is because of the radiation involved in the test. What happens before the procedure? No preparation is needed for this procedure. What happens during the procedure? You will undress and remove any jewelry around your neck or chest. You will put on a hospital gown. Sticky electrodes will be placed on your chest. The electrodes will be connected to an electrocardiogram (ECG) machine to record a tracing of the electrical activity of your heart. A CT scanner will take pictures of your heart. During  this time, you will be asked to lie still and hold your breath for 2-3 seconds while a picture of your heart is being taken. The procedure may vary among health care providers and hospitals. What happens after the procedure? You  can get dressed. You can return to your normal activities. It is up to you to get the results of your test. Ask your health care provider, or the department that is doing the test, when your results will be ready. Summary A coronary calcium scan is an imaging test used to look for deposits of calcium and other fatty materials (plaques) in the inner lining of the blood vessels of the heart (coronary arteries). Generally, this is a safe procedure. Tell your health care provider if you are pregnant or may be pregnant. No preparation is needed for this procedure. A CT scanner will take pictures of your heart. You can return to your normal activities after the scan is done. This information is not intended to replace advice given to you by your health care provider. Make sure you discuss any questions you have with your health care provider. Document Released: 09/25/2007 Document Revised: 02/16/2016 Document Reviewed: 02/16/2016 Elsevier Interactive Patient Education  2017 [employer].    Follow-Up: At [facility], you and your health needs are our priority.  As part of our continuing mission to provide you with exceptional heart care, we have created designated Provider Care Teams.  These Care Teams include your primary Cardiologist (physician) and Advanced Practice Providers (APPs -  Physician Assistants and Nurse Practitioners) who all work together to provide you with the care you need, when you need it.  We recommend signing up for the patient portal called "MyChart".  Sign up information is provided on this After Visit Summary.  MyChart is used to connect with patients for Virtual Visits (Telemedicine).  Patients are able to view lab/test results, encounter notes, upcoming appointments, etc.  Non-urgent messages can be sent to your provider as well.   To learn more about what you can do with MyChart, go to [email or web address].    Your next appointment:   5 month(s)  Provider:    Thomasene Ripple, DO     Other Instructions  Blood pressure cuff size: 12L  Please take your blood pressure daily for 2 weeks and send in a MyChart message. Please include heart rates. (One message at the end of the 2 weeks).   HOW TO TAKE YOUR BLOOD PRESSURE: Rest 5 minutes before taking your blood pressure. Don't smoke or drink caffeinated beverages for at least 30 minutes before. Take your blood pressure before (not after) you eat. Sit comfortably with your back supported and both feet on the floor (don't cross your legs). Elevate your arm to heart level on a table or a desk. Use the proper sized cuff. It should fit smoothly and snugly around your bare upper arm. There should be enough room to slip a fingertip under the cuff. The bottom edge of the cuff should be 1 inch above the crease of the elbow. Ideally, take 3 measurements at one sitting and record the average.

## 2023-07-04 NOTE — Progress Notes (Signed)
 Cardiology Office Note:    Date:  07/04/2023   ID:  Amanda Paul, DOB Feb 23, 1976, MRN [id number]  PCP:  Amanda Peng, MD  Cardiologist:  Amanda Ripple, DO  Electrophysiologist:  None   Referring MD: Amanda Peng, MD   " I am doing well"  History of Present Illness:    Amanda Paul is a 48 y.o. female with a hx of morbid obesity, coronary calcification noted on CT scan of the chest, abnormal echocardiogram in the past, hyperlipidemia and hypertension.  She presents for a routine check-up. She has been on medication for her blood pressure for approximately three years and reports that it is generally well-controlled. However, she notes that her blood pressure readings can sometimes be elevated, particularly when measured with a cuff that she feels is too tight.  The patient has a significant family history of heart disease. Her mother has heart failure and severe COPD, and her father experienced a mild heart attack two years ago with no preceding symptoms. An aunt has also had multiple stent placements due to heart disease.  In addition to her blood pressure concerns, [name] mentions having had an abnormal EKG in the past. She recalls being told that some of the walls of her heart were not moving correctly. She also mentions having had a CT scan of her chest, but it is unclear whether this was related to her heart concerns or another issue.  The patient is proactive about her health and is keen to follow recommended screenings and tests for her age group. She has already had a colonoscopy at age 28 and is due for another in three years. She is also aware of the need for regular mammograms.  Past Medical History:  Diagnosis Date   Allergy    Arthritis    Hernia, umbilical    Morbid obesity [facility])     Past Surgical History:  Procedure Laterality Date   ADVANCEMENT / RECONSTRUCTION POSTERIOR TIBIAL TENDON / [place] Left 10/09/2021   COLONOSCOPY WITH  PROPOFOL N/A 06/13/2020   Procedure: COLONOSCOPY WITH PROPOFOL;  Surgeon: Amanda Hawking, MD;  Location: [facility];  Service: Endoscopy;  Laterality: N/A;   HEMOSTASIS CLIP PLACEMENT  06/13/2020   Procedure: HEMOSTASIS CLIP PLACEMENT;  Surgeon: Amanda Hawking, MD;  Location: [facility];  Service: Endoscopy;;   HERNIA REPAIR     HYSTEROSCOPY WITH D & C N/A 08/26/2014   Procedure: DILATATION AND CURETTAGE /HYSTEROSCOPY and cervical repair of cervical laceration ;  Surgeon: Amanda Lay, MD;  Location: [facility];  Service: Gynecology;  Laterality: N/A;   IR ANGIOGRAM PELVIS SELECTIVE OR SUPRASELECTIVE  10/08/2020   IR ANGIOGRAM PELVIS SELECTIVE OR SUPRASELECTIVE  10/08/2020   IR ANGIOGRAM SELECTIVE EACH ADDITIONAL VESSEL  10/08/2020   IR EMBO TUMOR ORGAN ISCHEMIA INFARCT INC GUIDE ROADMAPPING  10/08/2020   IR RADIOLOGIST EVAL & MGMT  07/10/2020   IR RADIOLOGIST EVAL & MGMT  07/31/2020   IR RADIOLOGIST EVAL & MGMT  09/09/2020   IR RADIOLOGIST EVAL & MGMT  11/18/2020   IR RADIOLOGIST EVAL & MGMT  09/14/2021   IR US GUIDE VASC ACCESS RIGHT  10/08/2020   POLYPECTOMY  06/13/2020   Procedure: POLYPECTOMY;  Surgeon: Amanda Hawking, MD;  Location: [facility];  Service: Endoscopy;;   UMBILICAL HERNIA REPAIR     WISDOM TOOTH EXTRACTION      Current Medications: Current Meds  Medication Sig   amLODipine (NORVASC) 10 MG tablet TAKE 1 TABLET BY MOUTH EVERY DAY  meloxicam (MOBIC) 15 MG tablet TAKE 1 TABLET (15 MG TOTAL) BY MOUTH DAILY.   rosuvastatin (CRESTOR) 10 MG tablet Take 1 tablet (10 mg total) by mouth daily.   Vitamin D, Ergocalciferol, (DRISDOL) 1.25 MG (50000 UNIT) CAPS capsule Take 1 capsule (50,000 Units total) by mouth every 7 (seven) days.     Allergies:   Amoxicillin and Penicillins   Social History   Socioeconomic History   Marital status: Single    Spouse name: Not on file   Number of children: Not on file   Years of education: Not on file   Highest education level: Some  college, no degree  Occupational History   Not on file  Tobacco Use   Smoking status: Never   Smokeless tobacco: Never  Vaping Use   Vaping status: Never Used  Substance and Sexual Activity   Alcohol use: Yes    Alcohol/week: 1.0 standard drink of alcohol    Types: 1 Cans of beer per week    Comment: rare   Drug use: No   Sexual activity: Not Currently  Other Topics Concern   Not on file  Social History Narrative   Marital status: single      Children: none      Lives: alone      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         Social Drivers of Health   Financial Resource Strain: Low Risk  (03/21/2023)   Overall Financial Resource Strain (CARDIA)    Difficulty of Paying Living Expenses: Not hard at all  Food Insecurity: No Food Insecurity (03/21/2023)   Hunger Vital Sign    Worried About Running Out of Food in the Last Year: Never true    Ran Out of Food in the Last Year: Never true  Transportation Needs: No Transportation Needs (03/21/2023)   PRAPARE - Administrator, Civil Service (Medical): No    Lack of Transportation (Non-Medical): No  Physical Activity: Unknown (03/21/2023)   Exercise Vital Sign    Days of Exercise per Week: 0 days    Minutes of Exercise per Session: Not on file  Stress: No Stress Concern Present (03/21/2023)   [employer] of Occupational Health - Occupational Stress Questionnaire    Feeling of Stress : Not at all  Social Connections: Moderately Isolated (03/21/2023)   Social Connection and Isolation Panel [NHANES]    Frequency of Communication with Friends and Family: More than three times a week    Frequency of Social Gatherings with Friends and Family: Three times a week    Attends Religious Services: 1 to 4 times per year    Active Member of Clubs or Organizations: No    Attends Engineer, structural: Not on file    Marital Status: Never married     Family History: The patient's family history includes Hypertension in  her mother.  ROS:   Review of Systems  Constitution: Negative for decreased appetite, fever and weight gain.  HENT: Negative for congestion, ear discharge, hoarse voice and sore throat.   Eyes: Negative for discharge, redness, vision loss in right eye and visual halos.  Cardiovascular: Negative for chest pain, dyspnea on exertion, leg swelling, orthopnea and palpitations.  Respiratory: Negative for cough, hemoptysis, shortness of breath and snoring.   Endocrine: Negative for heat intolerance and polyphagia.  Hematologic/Lymphatic: Negative for bleeding problem. Does not bruise/bleed easily.  Skin: Negative for flushing, nail changes, rash and suspicious lesions.  Musculoskeletal: Negative for  arthritis, joint pain, muscle cramps, myalgias, neck pain and stiffness.  Gastrointestinal: Negative for abdominal pain, bowel incontinence, diarrhea and excessive appetite.  Genitourinary: Negative for decreased libido, genital sores and incomplete emptying.  Neurological: Negative for brief paralysis, focal weakness, headaches and loss of balance.  Psychiatric/Behavioral: Negative for altered mental status, depression and suicidal ideas.  Allergic/Immunologic: Negative for HIV exposure and persistent infections.    EKGs/Labs/Other Studies Reviewed:    The following studies were reviewed today:   EKG:  The ekg ordered today demonstrates   Recent Labs: 09/15/2022: Hemoglobin 13.6; Platelets 291 03/21/2023: ALT 21; BUN 19; Creatinine, Ser 0.64; Potassium 4.3; Sodium 142  Recent Lipid Panel    Component Value Date/Time   CHOL 187 03/21/2023 0919   TRIG 37 03/21/2023 0919   HDL 82 03/21/2023 0919   CHOLHDL 2.3 03/21/2023 0919   CHOLHDL 2.7 09/30/2015 0910   VLDL 11 09/30/2015 0910   LDLCALC 97 03/21/2023 0919    Physical Exam:    VS:  BP (!) 132/94   Pulse 84   Ht 5\' 5"  (1.651 m)   Wt 288 lb 3.2 oz (130.7 kg)   LMP 02/06/2023 (Exact Date)   BMI 47.96 kg/m     Wt Readings from Last 3  Encounters:  07/04/23 288 lb 3.2 oz (130.7 kg)  06/29/23 290 lb (131.5 kg)  03/21/23 292 lb (132.5 kg)     GEN: Well nourished, well developed in no acute distress HEENT: Normal NECK: No JVD; No carotid bruits LYMPHATICS: No lymphadenopathy CARDIAC: S1S2 noted,RRR, no murmurs, rubs, gallops RESPIRATORY:  Clear to auscultation without rales, wheezing or rhonchi  ABDOMEN: Soft, non-tender, non-distended, +bowel sounds, no guarding. EXTREMITIES: No edema, No cyanosis, no clubbing MUSCULOSKELETAL:  No deformity  SKIN: Warm and dry NEUROLOGIC:  Alert and oriented x 3, non-focal PSYCHIATRIC:  Normal affect, good insight  ASSESSMENT:    1. Hypertension, unspecified type   2. Coronary artery calcification   3. ASCVD (arteriosclerotic cardiovascular disease)   4. Hypertensive heart disease without heart failure   5. Hyperlipidemia, unspecified hyperlipidemia type   6. Morbid obesity (HCC)    PLAN:    Coronary artery calcification noted on CT scan of the chest . LDL cholesterol at 97 mg/dL exceeds target for CAD risk factors. Coronary calcium score needed for calcification assessment and treatment guidance. - Order coronary CT for calcium scoring. - Adjust LDL cholesterol management to target less than 70 mg/dL based on coronary calcium results.  Abnormal Echocardiogram Previous echocardiogram showed abnormal wall motion. Re-evaluation needed to assess current heart function and wall motion. - Schedule repeat echocardiogram.  Hypertension Hypertension suboptimally controlled with medication noted her blood pressure today, she tells me at home she is well-controlled.  She is using the wrist blood pressure cuff.  I have asked the patient to obtain a validated blood pressure cuff, size 12L, through insurance program. - Monitor blood pressure daily.  She will send me updates in 2 weeks. - Schedule follow-up in approximately 5 months.  Pre-diabetes Pre-diabetes increases cardiovascular  risk. Lifestyle modifications and monitoring are essential to prevent progression. - Monitor blood glucose levels regularly. - Encourage lifestyle modifications including diet and exercise.  The patient understands the need to lose weight with diet and exercise. We have discussed specific strategies for this.  The patient is in agreement with the above plan. The patient left the office in stable condition.  The patient will follow up in   Medication Adjustments/Labs and Tests Ordered: Current medicines are  reviewed at length with the patient today.  Concerns regarding medicines are outlined above.  Orders Placed This Encounter  Procedures   CT CARDIAC SCORING (SELF PAY ONLY)   Lipid panel   Lipoprotein A (LPA)   EKG 12-Lead   ECHOCARDIOGRAM COMPLETE   No orders of the defined types were placed in this encounter.   Patient Instructions  Medication Instructions:  Your physician recommends that you continue on your current medications as directed. Please refer to the Current Medication list given to you today.  *If you need a refill on your cardiac medications before your next appointment, please call your pharmacy*   Lab Work: Lipids, Lp(a) If you have labs (blood work) drawn today and your tests are completely normal, you will receive your results only by: MyChart Message (if you have MyChart) OR A paper copy in the mail If you have any lab test that is abnormal or we need to change your treatment, we will call you to review the results.   Testing/Procedures: Your physician has requested that you have an echocardiogram. Echocardiography is a painless test that uses sound waves to create images of your heart. It provides your doctor with information about the size and shape of your heart and how well your heart's chambers and valves are working. This procedure takes approximately one hour. There are no restrictions for this procedure. Please do NOT wear cologne, perfume,  aftershave, or lotions (deodorant is allowed). Please arrive 15 minutes prior to your appointment time.  Please note: We ask at that you not bring children with you during ultrasound (echo/ vascular) testing. Due to room size and safety concerns, children are not allowed in the ultrasound rooms during exams. Our front office staff cannot provide observation of children in our lobby area while testing is being conducted. An adult accompanying a patient to their appointment will only be allowed in the ultrasound room at the discretion of the ultrasound technician under special circumstances. We apologize for any inconvenience.  Dr. Servando Salina has ordered a CT coronary calcium score.   Test locations:  [facility]  [facility]  This is $99 out of pocket.   Coronary CalciumScan A coronary calcium scan is an imaging test used to look for deposits of calcium and other fatty materials (plaques) in the inner lining of the blood vessels of the heart (coronary arteries). These deposits of calcium and plaques can partly clog and narrow the coronary arteries without producing any symptoms or warning signs. This puts a person at risk for a heart attack. This test can detect these deposits before symptoms develop. Tell a health care provider about: Any allergies you have. All medicines you are taking, including vitamins, herbs, eye drops, creams, and over-the-counter medicines. Any problems you or family members have had with anesthetic medicines. Any blood disorders you have. Any surgeries you have had. Any medical conditions you have. Whether you are pregnant or may be pregnant. What are the risks? Generally, this is a safe procedure. However, problems may occur, including: Harm to a pregnant woman and her unborn baby. This test involves the use of radiation. Radiation exposure can be dangerous to a pregnant woman  and her unborn baby. If you are pregnant, you generally should not have this procedure done. Slight increase in the risk of cancer. This is because of the radiation involved in the test. What happens before the procedure? No preparation is needed for  this procedure. What happens during the procedure? You will undress and remove any jewelry around your neck or chest. You will put on a hospital gown. Sticky electrodes will be placed on your chest. The electrodes will be connected to an electrocardiogram (ECG) machine to record a tracing of the electrical activity of your heart. A CT scanner will take pictures of your heart. During this time, you will be asked to lie still and hold your breath for 2-3 seconds while a picture of your heart is being taken. The procedure may vary among health care providers and hospitals. What happens after the procedure? You can get dressed. You can return to your normal activities. It is up to you to get the results of your test. Ask your health care provider, or the department that is doing the test, when your results will be ready. Summary A coronary calcium scan is an imaging test used to look for deposits of calcium and other fatty materials (plaques) in the inner lining of the blood vessels of the heart (coronary arteries). Generally, this is a safe procedure. Tell your health care provider if you are pregnant or may be pregnant. No preparation is needed for this procedure. A CT scanner will take pictures of your heart. You can return to your normal activities after the scan is done. This information is not intended to replace advice given to you by your health care provider. Make sure you discuss any questions you have with your health care provider. Document Released: 09/25/2007 Document Revised: 02/16/2016 Document Reviewed: 02/16/2016 Elsevier Interactive Patient Education  2017 [employer].    Follow-Up: At [facility], you and your  health needs are our priority.  As part of our continuing mission to provide you with exceptional heart care, we have created designated Provider Care Teams.  These Care Teams include your primary Cardiologist (physician) and Advanced Practice Providers (APPs -  Physician Assistants and Nurse Practitioners) who all work together to provide you with the care you need, when you need it.  We recommend signing up for the patient portal called "MyChart".  Sign up information is provided on this After Visit Summary.  MyChart is used to connect with patients for Virtual Visits (Telemedicine).  Patients are able to view lab/test results, encounter notes, upcoming appointments, etc.  Non-urgent messages can be sent to your provider as well.   To learn more about what you can do with MyChart, go to [email or web address].    Your next appointment:   5 month(s)  Provider:   Thomasene Ripple, DO     Other Instructions  Blood pressure cuff size: 12L  Please take your blood pressure daily for 2 weeks and send in a MyChart message. Please include heart rates. (One message at the end of the 2 weeks).   HOW TO TAKE YOUR BLOOD PRESSURE: Rest 5 minutes before taking your blood pressure. Don't smoke or drink caffeinated beverages for at least 30 minutes before. Take your blood pressure before (not after) you eat. Sit comfortably with your back supported and both feet on the floor (don't cross your legs). Elevate your arm to heart level on a table or a desk. Use the proper sized cuff. It should fit smoothly and snugly around your bare upper arm. There should be enough room to slip a fingertip under the cuff. The bottom edge of the cuff should be 1 inch above the crease of the elbow. Ideally, take 3 measurements at one sitting and record the average.  Adopting a Healthy Lifestyle.  Know what a healthy weight is for you (roughly BMI <25) and aim to maintain this   Aim for 7+ servings of fruits and  vegetables daily   65-80+ fluid ounces of water or unsweet tea for healthy kidneys   Limit to max 1 drink of alcohol per day; avoid smoking/tobacco   Limit animal fats in diet for cholesterol and heart health - choose grass fed whenever available   Avoid highly processed foods, and foods high in saturated/trans fats   Aim for low stress - take time to unwind and care for your mental health   Aim for 150 min of moderate intensity exercise weekly for heart health, and weights twice weekly for bone health   Aim for 7-9 hours of sleep daily   When it comes to diets, agreement about the perfect plan isnt easy to find, even among the experts. Experts at the [facility] of [employer] developed an idea known as the Healthy Eating Plate. Just imagine a plate divided into logical, healthy portions.   The emphasis is on diet quality:   Load up on vegetables and fruits - one-half of your plate: Aim for color and variety, and remember that potatoes dont count.   Go for whole grains - one-quarter of your plate: Whole wheat, barley, wheat berries, quinoa, oats, brown rice, and foods made with them. If you want pasta, go with whole wheat pasta.   Protein power - one-quarter of your plate: Fish, chicken, beans, and nuts are all healthy, versatile protein sources. Limit red meat.   The diet, however, does go beyond the plate, offering a few other suggestions.   Use healthy plant oils, such as olive, canola, soy, corn, sunflower and peanut. Check the labels, and avoid partially hydrogenated oil, which have unhealthy trans fats.   If youre thirsty, drink water. Coffee and tea are good in moderation, but skip sugary drinks and limit milk and dairy products to one or two daily servings.   The type of carbohydrate in the diet is more important than the amount. Some sources of carbohydrates, such as vegetables, fruits, whole grains, and beans-are healthier than others.   Finally, stay  active  Signed, Amanda Ripple, DO  07/04/2023 3:03 PM    [facility]

## 2023-07-05 ENCOUNTER — Encounter: Payer: Self-pay | Admitting: Cardiology

## 2023-07-05 LAB — LIPOPROTEIN A (LPA): Lipoprotein (a): 299.9 nmol/L — ABNORMAL HIGH (ref <75.0)

## 2023-07-05 LAB — LIPID PANEL
Chol/HDL Ratio: 2.5 ratio (ref 0.0–4.4)
Cholesterol, Total: 175 mg/dL (ref 100–199)
HDL: 70 mg/dL (ref >39)
LDL Chol Calc (NIH): 95 mg/dL (ref 0–99)
Triglycerides: 52 mg/dL (ref 0–149)
VLDL Cholesterol Cal: 10 mg/dL (ref 5–40)

## 2023-07-14 ENCOUNTER — Telehealth: Payer: Self-pay | Admitting: Gastroenterology

## 2023-07-14 NOTE — Telephone Encounter (Signed)
 Good afternoon Dr. Lavon Paganini,    We received a referral for patient to have a colonoscopy. Patient had a colonoscopy in 2022 with Dr. Elnoria Howard. States she had a consultation with Dr. Loreta Ave 2 weeks ago but was not happy with care provided. Patient was referred specifically over to you. Patient's previous records are in Epic media for you to review and advise on scheduling.    Thank you.

## 2023-07-21 NOTE — Telephone Encounter (Signed)
 She is due for surveillance colonoscopy for h/o colon polyps, please schedule direct next available appointment if no active GI symptoms. Thanks

## 2023-07-22 ENCOUNTER — Encounter: Payer: Self-pay | Admitting: Gastroenterology

## 2023-08-01 NOTE — Progress Notes (Deleted)
 I,Anyelin Mogle T Basil Lim, CMA,acting as a Neurosurgeon for Smiley Dung, MD.,have documented all relevant documentation on the behalf of Smiley Dung, MD,as directed by  Smiley Dung, MD while in the presence of Smiley Dung, MD.  Subjective:  Patient ID: Amanda Paul , female    DOB: 1976-02-16 , 48 y.o.   MRN: [id number]  No chief complaint on file.   HPI  HPI   Past Medical History:  Diagnosis Date   Allergy    Arthritis    Hernia, umbilical    Morbid obesity (HCC)      Family History  Problem Relation Age of Onset   Hypertension Mother      Current Outpatient Medications:    amLODipine  (NORVASC ) 10 MG tablet, TAKE 1 TABLET BY MOUTH EVERY DAY, Disp: 90 tablet, Rfl: 3   meloxicam  (MOBIC ) 15 MG tablet, TAKE 1 TABLET (15 MG TOTAL) BY MOUTH DAILY., Disp: 30 tablet, Rfl: 3   rosuvastatin  (CRESTOR ) 10 MG tablet, Take 1 tablet (10 mg total) by mouth daily., Disp: 90 tablet, Rfl: 3   Vitamin D , Ergocalciferol , (DRISDOL ) 1.25 MG (50000 UNIT) CAPS capsule, Take 1 capsule (50,000 Units total) by mouth every 7 (seven) days., Disp: 12 capsule, Rfl: 1   Allergies  Allergen Reactions   Amoxicillin Hives, Itching and Swelling    Swollen eyes and throat.   Penicillins Hives and Swelling     Review of Systems  Constitutional: Negative.   Respiratory: Negative.    Cardiovascular: Negative.   Neurological: Negative.   Psychiatric/Behavioral: Negative.       There were no vitals filed for this visit. There is no height or weight on file to calculate BMI.  Wt Readings from Last 3 Encounters:  07/04/23 288 lb 3.2 oz (130.7 kg)  06/29/23 290 lb (131.5 kg)  03/21/23 292 lb (132.5 kg)     Objective:  Physical Exam      Assessment And Plan:  There are no diagnoses linked to this encounter.   No follow-ups on file.  Patient was given opportunity to ask questions. Patient verbalized understanding of the plan and was able to repeat key elements of the plan. All  questions were answered to their satisfaction.  Smiley Dung, MD  I, Smiley Dung, MD, have reviewed all documentation for this visit. The documentation on 08/01/23 for the exam, diagnosis, procedures, and orders are all accurate and complete.   IF YOU HAVE BEEN REFERRED TO A SPECIALIST, IT MAY TAKE 1-2 WEEKS TO SCHEDULE/PROCESS THE REFERRAL. IF YOU HAVE NOT HEARD FROM US /SPECIALIST IN TWO WEEKS, PLEASE GIVE US  A CALL AT [phone] X [phone].   THE PATIENT IS ENCOURAGED TO PRACTICE SOCIAL DISTANCING DUE TO THE COVID-19 PANDEMIC.

## 2023-08-02 ENCOUNTER — Ambulatory Visit: Payer: No Typology Code available for payment source | Admitting: Internal Medicine

## 2023-08-10 ENCOUNTER — Ambulatory Visit (HOSPITAL_BASED_OUTPATIENT_CLINIC_OR_DEPARTMENT_OTHER)
Admission: RE | Admit: 2023-08-10 | Discharge: 2023-08-10 | Disposition: A | Discharge status: Home / Self Care | Payer: Self-pay | Source: Ambulatory Visit | Attending: Cardiology | Admitting: Cardiology

## 2023-08-10 ENCOUNTER — Ambulatory Visit (HOSPITAL_BASED_OUTPATIENT_CLINIC_OR_DEPARTMENT_OTHER)

## 2023-08-10 DIAGNOSIS — I119 Hypertensive heart disease without heart failure: Secondary | ICD-10-CM | POA: Diagnosis not present

## 2023-08-10 DIAGNOSIS — I2510 Atherosclerotic heart disease of native coronary artery without angina pectoris: Secondary | ICD-10-CM | POA: Insufficient documentation

## 2023-08-10 LAB — ECHOCARDIOGRAM COMPLETE
AR max vel: 1.75 cm2
AV Area VTI: 1.78 cm2
AV Area mean vel: 1.69 cm2
AV Mean grad: 4 mmHg
AV Peak grad: 9 mmHg
Ao pk vel: 1.5 m/s
Area-P 1/2: 4.41 cm2
S' Lateral: 3.52 cm

## 2023-08-23 ENCOUNTER — Ambulatory Visit: Payer: Self-pay | Admitting: Cardiology

## 2023-08-24 ENCOUNTER — Telehealth: Payer: Self-pay

## 2023-08-24 NOTE — Telephone Encounter (Signed)
 Patient called.  Left message for patient to call back to make an appointment May 22nd.

## 2023-09-12 ENCOUNTER — Encounter: Payer: Self-pay | Admitting: Internal Medicine

## 2023-09-14 ENCOUNTER — Encounter

## 2023-09-16 ENCOUNTER — Encounter: Payer: Self-pay | Admitting: Cardiology

## 2023-09-16 ENCOUNTER — Ambulatory Visit

## 2023-09-16 VITALS — Ht 65.0 in | Wt 288.0 lb

## 2023-09-16 DIAGNOSIS — Z860100 Personal history of colon polyps, unspecified: Secondary | ICD-10-CM

## 2023-09-16 MED ORDER — PEG 3350-KCL-NA BICARB-NACL 420 G PO SOLR: 4000.0000 mL | Freq: Once | ORAL | 0 refills | Status: AC

## 2023-09-16 NOTE — Progress Notes (Signed)
 Pt's name and DOB verified at the beginning of the pre-visit wit 2 identifiers  Pt denies any difficulty with ambulating,sitting, laying down or rolling side to side  Pt has no issues moving head neck or swallowing  No egg or soy allergy known to patient   No issues known to pt with past sedation with any surgeries or procedures  No FH of Malignant Hyperthermia  Pt is not on home 02   Pt is not on blood thinners   Pt denies issues with constipation   Pt has frequent issues with constipation RN instructed pt to use Miralax per bottles instructions a week before prep days. Pt states they will  Pt is not on dialysis  Pt denise any abnormal heart rhythms   Pt denies any upcoming cardiac testing  Patient's chart reviewed by Rogena Class CNRA prior to pre-visit and patient appropriate for the [facility].  Pre-visit completed and red dot placed by patient's name on their procedure day (on provider's schedule).     Visit by phon  Pt states weight is 288 lb  IInstructions reviewed. Pt given  both [facility] main # and MD on call # prior to instructions.  Pt states understanding of instructions. Instructed pt to review instructions again prior to procedure and call main # given if has questions.. Pt states they will.   Instructed pt on where to find instructions on My Chart.

## 2023-09-17 ENCOUNTER — Other Ambulatory Visit: Payer: Self-pay | Admitting: Podiatry

## 2023-09-19 ENCOUNTER — Encounter: Payer: Self-pay | Admitting: Gastroenterology

## 2023-09-19 NOTE — Telephone Encounter (Signed)
 Please schedule pt

## 2023-09-20 ENCOUNTER — Ambulatory Visit (INDEPENDENT_AMBULATORY_CARE_PROVIDER_SITE_OTHER): Admitting: Internal Medicine

## 2023-09-20 VITALS — BP 132/80 | HR 96 | Temp 98.2°F | Ht 65.0 in | Wt 298.0 lb

## 2023-09-20 DIAGNOSIS — Z23 Encounter for immunization: Secondary | ICD-10-CM | POA: Diagnosis not present

## 2023-09-20 DIAGNOSIS — E66813 Obesity, class 3: Secondary | ICD-10-CM

## 2023-09-20 DIAGNOSIS — I2510 Atherosclerotic heart disease of native coronary artery without angina pectoris: Secondary | ICD-10-CM

## 2023-09-20 DIAGNOSIS — E7800 Pure hypercholesterolemia, unspecified: Secondary | ICD-10-CM

## 2023-09-20 DIAGNOSIS — M25571 Pain in right ankle and joints of right foot: Secondary | ICD-10-CM

## 2023-09-20 DIAGNOSIS — R7303 Prediabetes: Secondary | ICD-10-CM

## 2023-09-20 DIAGNOSIS — G8929 Other chronic pain: Secondary | ICD-10-CM

## 2023-09-20 DIAGNOSIS — E559 Vitamin D deficiency, unspecified: Secondary | ICD-10-CM

## 2023-09-20 DIAGNOSIS — Z6841 Body Mass Index (BMI) 40.0 and over, adult: Secondary | ICD-10-CM

## 2023-09-20 DIAGNOSIS — Z0000 Encounter for general adult medical examination without abnormal findings: Secondary | ICD-10-CM | POA: Insufficient documentation

## 2023-09-20 DIAGNOSIS — M25572 Pain in left ankle and joints of left foot: Secondary | ICD-10-CM

## 2023-09-20 DIAGNOSIS — R0982 Postnasal drip: Secondary | ICD-10-CM

## 2023-09-20 DIAGNOSIS — I119 Hypertensive heart disease without heart failure: Secondary | ICD-10-CM | POA: Insufficient documentation

## 2023-09-20 DIAGNOSIS — J309 Allergic rhinitis, unspecified: Secondary | ICD-10-CM

## 2023-09-20 LAB — POCT URINALYSIS DIP (CLINITEK)
Bilirubin, UA: NEGATIVE
Glucose, UA: NEGATIVE mg/dL
Ketones, POC UA: NEGATIVE mg/dL
Leukocytes, UA: NEGATIVE
Nitrite, UA: NEGATIVE
POC PROTEIN,UA: NEGATIVE
Spec Grav, UA: 1.025 (ref 1.010–1.025)
Urobilinogen, UA: 0.2 U/dL
pH, UA: 5.5 (ref 5.0–8.0)

## 2023-09-20 MED ORDER — ROSUVASTATIN CALCIUM 20 MG PO TABS: 20.0000 mg | ORAL_TABLET | Freq: Every day | ORAL | 1 refills | Status: DC

## 2023-09-20 MED ORDER — BENZONATATE 100 MG PO CAPS: 100.0000 mg | ORAL_CAPSULE | Freq: Three times a day (TID) | ORAL | 0 refills | Status: DC | PRN

## 2023-09-20 NOTE — Assessment & Plan Note (Signed)

## 2023-09-20 NOTE — Patient Instructions (Signed)

## 2023-09-20 NOTE — Progress Notes (Signed)
 I,Amanda Paul, CMA,acting as a Neurosurgeon for Amanda LOISE Slocumb, MD.,have documented all relevant documentation on the behalf of Amanda LOISE Slocumb, MD,as directed by  Amanda LOISE Slocumb, MD while in the presence of Amanda LOISE Slocumb, MD.  Subjective:    Patient ID: Amanda Paul , female    DOB: Feb 01, 1976 , 48 y.o.   MRN: [id number]  Chief Complaint  Patient presents with   Annual Exam   Hypertension    HPI .Discussed the use of AI scribe software for clinical note transcription with the patient, who gave verbal consent to proceed.  History of Present Illness Amanda Paul is a 48 year old female with hypertension and coronary artery disease who presents for a physical and blood pressure check.  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 Staffing issues have increased her workload and stress. She has not been monitoring her blood pressure at home.  Her history of coronary artery disease includes a CT of the chest showing slight enlargement of the left ventricle and trivial mitral valve regurgitation. Her calcium  score is 54. She is on rosuvastatin  10 mg in the evening, with plans to increase to 20 mg due to elevated LP(a) levels and a genetic predisposition to heart disease. Her medication regimen has remained unchanged since March.  She experiences daily ankle pain and inflammation, which she attributes to prolonged standing at work. She uses meloxicam  daily for inflammation and has orthotics and a soft brace for support. She has not seen a foot doctor recently and is considering using Voltaren gel as an alternative to meloxicam .  She reports irregular menstrual cycles, with a recent return of her period after a year of absence. She previously underwent uterine fibroid embolization, which significantly reduced her menstrual flow. She is scheduled to see her gynecologist next month and is due for a  mammogram in December.  She is not engaging in regular exercise and has difficulty maintaining hydration at work due to restrictions on having drinks in her department. She attempts to drink at least two liters of water daily to prevent leg and foot cramps.  She is scheduled for a colonoscopy at the end of the month, having had a previous colonoscopy three years ago where a polyp was found. She is preparing for the procedure and has yet to pick up the prep materials.    Hypertension This is a chronic problem. The current episode started more than 1 year ago. The problem has been gradually improving since onset. The problem is controlled. Pertinent negatives include no blurred vision or palpitations. Risk factors for coronary artery disease include dyslipidemia and obesity. Past treatments include calcium  channel blockers.     Past Medical History:  Diagnosis Date   Allergy    Arthritis    Hernia, umbilical    Hyperlipidemia    Hypertension    Morbid obesity (HCC)    Uterine fibroids      Family History  Problem Relation Age of Onset   Hypertension Mother    Colon polyps Neg Hx    Colon cancer Neg Hx    Esophageal cancer Neg Hx    Rectal cancer Neg Hx    Stomach cancer Neg Hx      Current Outpatient Medications:    amLODipine  (NORVASC ) 10 MG tablet, TAKE 1 TABLET BY MOUTH EVERY DAY, Disp: 90 tablet, Rfl: 3   benzonatate  (TESSALON  PERLES) 100 MG capsule, Take 1 capsule (  100 mg total) by mouth 3 (three) times daily as needed for cough., Disp: 30 capsule, Rfl: 0   meloxicam  (MOBIC ) 15 MG tablet, TAKE 1 TABLET (15 MG TOTAL) BY MOUTH DAILY., Disp: 30 tablet, Rfl: 3   rosuvastatin  (CRESTOR ) 20 MG tablet, Take 1 tablet (20 mg total) by mouth daily., Disp: 90 tablet, Rfl: 1   Vitamin D , Ergocalciferol , (DRISDOL ) 1.25 MG (50000 UNIT) CAPS capsule, Take 1 capsule (50,000 Units total) by mouth every 7 (seven) days., Disp: 12 capsule, Rfl: 1   azithromycin  (ZITHROMAX ) 250 MG tablet, Take 2  tablets together on day 1, then take 1 tablet daily for 4 days, Disp: 6 tablet, Rfl: 0   promethazine -dextromethorphan (PROMETHAZINE -DM) 6.25-15 MG/5ML syrup, Take 5 mLs by mouth 4 (four) times daily as needed for cough., Disp: 240 mL, Rfl: 0   Allergies  Allergen Reactions   Amoxicillin Hives, Itching and Swelling    Swollen eyes and throat.   Penicillins Hives and Swelling      The patient states she uses none for birth control. Patient's last menstrual period was 09/14/2023.. Negative for Dysmenorrhea. Negative for: breast discharge, breast lump(s), breast pain and breast self exam. Associated symptoms include abnormal vaginal bleeding. Pertinent negatives include abnormal bleeding (hematology), anxiety, decreased libido, depression, difficulty falling sleep, dyspareunia, history of infertility, nocturia, sexual dysfunction, sleep disturbances, urinary incontinence, urinary urgency, vaginal discharge and vaginal itching. Diet regular.The patient states her exercise level is  intermittent.  . The patient's tobacco use is:  Social History   Tobacco Use  Smoking Status Never  Smokeless Tobacco Never  . She has been exposed to passive smoke. The patient's alcohol use is:  Social History   Substance and Sexual Activity  Alcohol Use Yes   Alcohol/week: 1.0 standard drink of alcohol   Types: 1 Cans of beer per week   Comment: rare    Review of Systems  Constitutional: Negative.  Negative for chills and fever.  HENT:  Positive for postnasal drip.   Eyes: Negative.  Negative for blurred vision.  Respiratory:  Positive for cough.   Cardiovascular: Negative.  Negative for palpitations.  Gastrointestinal: Negative.   Endocrine: Negative.   Genitourinary: Negative.   Musculoskeletal: Negative.   Skin: Negative.   Allergic/Immunologic: Negative.   Neurological: Negative.   Hematological: Negative.   Psychiatric/Behavioral: Negative.       Today's Vitals   09/20/23 1622  BP: 132/80   Pulse: 96  Temp: 98.2 F (36.8 C)  TempSrc: Oral  Weight: 298 lb (135.2 kg)  Height: 5' 5 (1.651 m)  PainSc: 0-No pain   Body mass index is 49.59 kg/m.  Wt Readings from Last 3 Encounters:  09/20/23 298 lb (135.2 kg)  09/16/23 288 lb (130.6 kg)  07/04/23 288 lb 3.2 oz (130.7 kg)    BP Readings from Last 3 Encounters:  09/24/23 (!) 154/76  09/20/23 132/80  07/04/23 (!) 132/94     Objective:  Physical Exam Vitals and nursing note reviewed.  Constitutional:      Appearance: Normal appearance. She is obese.  HENT:     Head: Normocephalic and atraumatic.     Right Ear: Tympanic membrane, ear canal and external ear normal.     Left Ear: Tympanic membrane, ear canal and external ear normal.     Nose: Nose normal.     Mouth/Throat:     Mouth: Mucous membranes are moist.     Pharynx: Oropharynx is clear.   Eyes:     Extraocular  Movements: Extraocular movements intact.     Conjunctiva/sclera: Conjunctivae normal.     Pupils: Pupils are equal, round, and reactive to light.    Cardiovascular:     Rate and Rhythm: Normal rate and regular rhythm.     Pulses: Normal pulses.     Heart sounds: Normal heart sounds.  Pulmonary:     Effort: Pulmonary effort is normal.     Breath sounds: Normal breath sounds.  Chest:  Breasts:    Tanner Score is 5.     Right: Normal.     Left: Normal.  Abdominal:     General: Bowel sounds are normal.     Palpations: Abdomen is soft.     Tenderness: There is no abdominal tenderness.     Comments: Obese, soft  Genitourinary:    Comments: deferred  Musculoskeletal:        General: Normal range of motion.     Cervical back: Normal range of motion and neck supple.     Right lower leg: Edema present.     Left lower leg: Edema present.   Skin:    General: Skin is warm and dry.   Neurological:     General: No focal deficit present.     Mental Status: She is alert and oriented to person, place, and time.   Psychiatric:        Mood and  Affect: Mood normal.        Behavior: Behavior normal.     Assessment And Plan:     Encounter for general adult medical examination w/o abnormal findings Assessment & Plan: A full exam was performed.  Importance of monthly self breast exams was discussed with the patient.  She is advised to get 30-45 minutes of regular exercise, no less than four to five days per week. Both weight-bearing and aerobic exercises are recommended.  She is advised to follow a healthy diet with at least six fruits/veggies per day, decrease intake of red meat and other saturated fats and to increase fish intake to twice weekly.  Meats/fish should not be fried -- baked, boiled or broiled is preferable. It is also important to cut back on your sugar intake.  Be sure to read labels - try to avoid anything with added sugar, high fructose corn syrup or other sweeteners.  If you must use a sweetener, you can try stevia or monkfruit.  It is also important to avoid artificially sweetened foods/beverages and diet drinks. Lastly, wear SPF 50 sunscreen on exposed skin and when in direct sunlight for an extended period of time.  Be sure to avoid fast food restaurants and aim for at least 60 ounces of water daily.      Orders: -     CBC -     CMP14+EGFR -     TSH  Hypertensive heart disease without heart failure Assessment & Plan: Chronic, not at goal. Goal BP<120/80.  Elevated blood pressure likely due to occupational stress. Long-standing hypertension indicated by slight left ventricular hypertrophy. Importance of stress management and health prioritization discussed. - Monitor blood pressure at home. - Discuss stress management techniques.  Orders: -     Microalbumin / creatinine urine ratio -     POCT URINALYSIS DIP (CLINITEK)  Coronary artery calcification Assessment & Plan: Calcium  score of 54 and elevated LP(a) suggest coronary artery disease. Trivial mitral valve regurgitation and regional wall motion abnormalities  noted on echo. Rosuvastatin  dose increased to target LDL below 70 mg/dL. - Increase  rosuvastatin  to 20 mg daily. - Order labs: CBC, A1c, thyroid function. - Consider coronary CT for blockages.   Pure hypercholesterolemia Assessment & Plan: Chronic, LDL goal is less than 70 due to coronary arterial calcifications. She will continue with rosuvastatin , dose increased to 20mg . Encouraged to follow heart healthy lifestyle.    Prediabetes Assessment & Plan: Previous labs reviewed, her A1c has been elevated in the past. I will check an A1c today. Reminded to avoid refined sugars including sugary drinks/foods and processed meats including bacon, sausages and deli meats.    Orders: -     CMP14+EGFR -     Hemoglobin A1c  Chronic pain of both ankles Assessment & Plan: Chronic ankle pain and inflammation from previous surgery and prolonged standing. Pain manageable; inflammation primary concern. Voltaren gel suggested as alternative to meloxicam . - Use Voltaren gel twice daily instead of meloxicam . - Encourage use of orthotics and soft brace.   Allergic rhinitis with postnasal drip Assessment & Plan: She can start trial of Zyrtec nightly.  - Given rx Tessalon  perles to use prn - Avoid dairy - Notify office if sx persist/worsen   Class 3 severe obesity due to excess calories with serious comorbidity and body mass index (BMI) of 45.0 to 49.9 in adult Assessment & Plan: BMI 49.  She is encouraged to incorporate more exercise into her daily routine and to aim to lose ten percent of her body weight to decrease cardiac risk.    Need for pneumococcal 20-valent conjugate vaccination -     Pneumococcal conjugate vaccine 20-valent  Other orders -     Rosuvastatin  Calcium ; Take 1 tablet (20 mg total) by mouth daily.  Dispense: 90 tablet; Refill: 1 -     Benzonatate ; Take 1 capsule (100 mg total) by mouth 3 (three) times daily as needed for cough.  Dispense: 30 capsule; Refill: 0   Return in  10 weeks (on 11/29/2023), or lab visit, chol check, for 1 year physical, 6 month bp. Patient was given opportunity to ask questions. Patient verbalized understanding of the plan and was able to repeat key elements of the plan. All questions were answered to their satisfaction.   I, Amanda LOISE Slocumb, MD, have reviewed all documentation for this visit. The documentation on 09/20/23 for the exam, diagnosis, procedures, and orders are all accurate and complete.

## 2023-09-21 ENCOUNTER — Encounter: Payer: No Typology Code available for payment source | Admitting: Internal Medicine

## 2023-09-21 LAB — CMP14+EGFR
ALT: 13 IU/L (ref 0–32)
AST: 14 IU/L (ref 0–40)
Albumin: 4.2 g/dL (ref 3.9–4.9)
Alkaline Phosphatase: 72 IU/L (ref 44–121)
BUN/Creatinine Ratio: 17 (ref 9–23)
BUN: 10 mg/dL (ref 6–24)
Bilirubin Total: 0.4 mg/dL (ref 0.0–1.2)
CO2: 22 mmol/L (ref 20–29)
Calcium: 9.3 mg/dL (ref 8.7–10.2)
Chloride: 97 mmol/L (ref 96–106)
Creatinine, Ser: 0.59 mg/dL (ref 0.57–1.00)
Globulin, Total: 4.2 g/dL (ref 1.5–4.5)
Glucose: 75 mg/dL (ref 70–99)
Potassium: 4.1 mmol/L (ref 3.5–5.2)
Sodium: 136 mmol/L (ref 134–144)
Total Protein: 8.4 g/dL (ref 6.0–8.5)
eGFR: 110 mL/min/{1.73_m2} (ref >59)

## 2023-09-21 LAB — HEMOGLOBIN A1C
Est. average glucose Bld gHb Est-mCnc: 123 mg/dL
Hgb A1c MFr Bld: 5.9 % — ABNORMAL HIGH (ref 4.8–5.6)

## 2023-09-21 LAB — CBC
Hematocrit: 39.9 % (ref 34.0–46.6)
Hemoglobin: 12.4 g/dL (ref 11.1–15.9)
MCH: 27 pg (ref 26.6–33.0)
MCHC: 31.1 g/dL — ABNORMAL LOW (ref 31.5–35.7)
MCV: 87 fL (ref 79–97)
Platelets: 387 10*3/uL (ref 150–450)
RBC: 4.59 x10E6/uL (ref 3.77–5.28)
RDW: 13.6 % (ref 11.7–15.4)
WBC: 6.9 10*3/uL (ref 3.4–10.8)

## 2023-09-21 LAB — TSH: TSH: 1.31 u[IU]/mL (ref 0.450–4.500)

## 2023-09-22 LAB — MICROALBUMIN / CREATININE URINE RATIO
Creatinine, Urine: 95 mg/dL
Microalb/Creat Ratio: 13 mg/g{creat} (ref 0–29)
Microalbumin, Urine: 12.1 ug/mL

## 2023-09-24 ENCOUNTER — Other Ambulatory Visit: Payer: Self-pay

## 2023-09-24 ENCOUNTER — Ambulatory Visit
Admission: EM | Admit: 2023-09-24 | Discharge: 2023-09-24 | Disposition: A | Discharge status: Home / Self Care | Attending: Emergency Medicine | Admitting: Emergency Medicine

## 2023-09-24 DIAGNOSIS — J019 Acute sinusitis, unspecified: Secondary | ICD-10-CM | POA: Diagnosis not present

## 2023-09-24 DIAGNOSIS — B9689 Other specified bacterial agents as the cause of diseases classified elsewhere: Secondary | ICD-10-CM

## 2023-09-24 MED ORDER — AZITHROMYCIN 250 MG PO TABS: ORAL_TABLET | ORAL | 0 refills | Status: DC

## 2023-09-24 NOTE — Discharge Instructions (Signed)
 Please take the azithromycin as directed  Continue the tessalon  perles  The promethazine  DM cough syrup can be used up to 4 times daily. If this medication makes you drowsy, take only once before bed.  Drink lots of fluids! Please return if needed

## 2023-09-24 NOTE — ED Provider Notes (Signed)
 [facility]    CSN: [id number] Arrival date & time: 09/24/23  1448      History   Chief Complaint Chief Complaint  Patient presents with   Cough    HPI Amanda Paul is a 48 y.o. female.  2 weeks of post nasal drip and some sinus congestion. Also having dry cough and fatigue  She denies fever or chills Feels like she just can't shake it and has been lingering  Has tried zyrtec and mucinex Went to PCP for physical 4 days ago and had pneumococcal vaccine. The day after that her symptoms worsened.  Nausea yesterday but no vomiting. Nausea has resolved now.  Past Medical History:  Diagnosis Date   Allergy    Arthritis    Hernia, umbilical    Hyperlipidemia    Hypertension    Morbid obesity (HCC)    Uterine fibroids     Patient Active Problem List   Diagnosis Date Noted   Encounter for general adult medical examination w/o abnormal findings 09/20/2023   Hypertensive heart disease without heart failure 09/20/2023   Prediabetes 03/21/2023   Class 3 severe obesity due to excess calories with serious comorbidity and body mass index (BMI) of 45.0 to 49.9 in adult 03/21/2023   Pure hypercholesterolemia 03/21/2023   Coronary artery calcification 03/21/2023   Enlarged liver 03/21/2023   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   Uterine leiomyoma 10/08/2020   Cervical high risk HPV (human papillomavirus) test positive 07/31/2019   Pain in right hand 01/19/2019   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   Fibroids 09/30/2015   Hypertension 05/10/2012   Morbid obesity (HCC)    Umbilical hernia 04/14/2012   Abnormal cervical Papanicolaou smear 05/13/2002   Low grade squamous intraepithelial lesion (LGSIL) on cervicovaginal cytologic smear 06/10/2000    Past Surgical History:  Procedure Laterality Date   ADVANCEMENT / RECONSTRUCTION POSTERIOR TIBIAL TENDON / Laural Polka Left 10/09/2021   COLONOSCOPY WITH PROPOFOL  N/A 06/13/2020   Procedure: COLONOSCOPY WITH PROPOFOL ;   Surgeon: Alvis Jourdain, MD;  Location: [facility];  Service: Endoscopy;  Laterality: N/A;   HEMOSTASIS CLIP PLACEMENT  06/13/2020   Procedure: HEMOSTASIS CLIP PLACEMENT;  Surgeon: Alvis Jourdain, MD;  Location: [facility];  Service: Endoscopy;;   HERNIA REPAIR     HYSTEROSCOPY WITH D & C N/A 08/26/2014   Procedure: DILATATION AND CURETTAGE /HYSTEROSCOPY and cervical repair of cervical laceration ;  Surgeon: Ona Bidding, MD;  Location: [facility];  Service: Gynecology;  Laterality: N/A;   IR ANGIOGRAM PELVIS SELECTIVE OR SUPRASELECTIVE  10/08/2020   IR ANGIOGRAM PELVIS SELECTIVE OR SUPRASELECTIVE  10/08/2020   IR ANGIOGRAM SELECTIVE EACH ADDITIONAL VESSEL  10/08/2020   IR EMBO TUMOR ORGAN ISCHEMIA INFARCT INC GUIDE ROADMAPPING  10/08/2020   IR RADIOLOGIST EVAL & MGMT  07/10/2020   IR RADIOLOGIST EVAL & MGMT  07/31/2020   IR RADIOLOGIST EVAL & MGMT  09/09/2020   IR RADIOLOGIST EVAL & MGMT  11/18/2020   IR RADIOLOGIST EVAL & MGMT  09/14/2021   IR US  GUIDE VASC ACCESS RIGHT  10/08/2020   POLYPECTOMY  06/13/2020   Procedure: POLYPECTOMY;  Surgeon: Alvis Jourdain, MD;  Location: [facility];  Service: Endoscopy;;   UMBILICAL HERNIA REPAIR     WISDOM TOOTH EXTRACTION      OB History     Gravida  0   Para  0   Term  0   Preterm  0   AB  0   Living  0      SAB  0   IAB  0   Ectopic  0   Multiple  0   Live Births               Home Medications    Prior to Admission medications   Medication Sig Start Date End Date Taking? Authorizing Provider  azithromycin (ZITHROMAX) 250 MG tablet Take 2 tablets together on day 1, then take 1 tablet daily for 4 days 09/24/23  Yes Davon Abdelaziz, Ivette Marks, PA-C  amLODipine  (NORVASC ) 10 MG tablet TAKE 1 TABLET BY MOUTH EVERY DAY 06/27/23   Cleave Curling, MD  benzonatate  (TESSALON  PERLES) 100 MG capsule Take 1 capsule (100 mg total) by mouth 3 (three) times daily as needed for cough. 09/20/23 09/19/24  Cleave Curling, MD  meloxicam  (MOBIC ) 15 MG  tablet TAKE 1 TABLET (15 MG TOTAL) BY MOUTH DAILY. 09/19/23   Hyatt, Max T, DPM  rosuvastatin  (CRESTOR ) 20 MG tablet Take 1 tablet (20 mg total) by mouth daily. 09/20/23 09/19/24  Cleave Curling, MD  Vitamin D , Ergocalciferol , (DRISDOL ) 1.25 MG (50000 UNIT) CAPS capsule Take 1 capsule (50,000 Units total) by mouth every 7 (seven) days. 10/05/21   Cleave Curling, MD    Family History Family History  Problem Relation Age of Onset   Hypertension Mother    Colon polyps Neg Hx    Colon cancer Neg Hx    Esophageal cancer Neg Hx    Rectal cancer Neg Hx    Stomach cancer Neg Hx     Social History Social History   Tobacco Use   Smoking status: Never   Smokeless tobacco: Never  Vaping Use   Vaping status: Never Used  Substance Use Topics   Alcohol use: Yes    Alcohol/week: 1.0 standard drink of alcohol    Types: 1 Cans of beer per week    Comment: rare   Drug use: No     Allergies   Amoxicillin and Penicillins   Review of Systems Review of Systems  All other systems reviewed and are negative. As per HPI   Physical Exam Triage Vital Signs ED Triage Vitals  Encounter Vitals Group     BP 09/24/23 1521 (!) 154/76     Girls Systolic BP Percentile --      Girls Diastolic BP Percentile --      Boys Systolic BP Percentile --      Boys Diastolic BP Percentile --      Pulse Rate 09/24/23 1521 82     Resp 09/24/23 1521 17     Temp 09/24/23 1521 99.1 F (37.3 C)     Temp Source 09/24/23 1521 Oral     SpO2 09/24/23 1521 91 %     Weight --      Height --      Head Circumference --      Peak Flow --      Pain Score 09/24/23 1519 0     Pain Loc --      Pain Education --      Exclude from Growth Chart --    No data found.  Updated Vital Signs BP (!) 154/76   Pulse 82   Temp 99.1 F (37.3 C) (Oral)   Resp 17   LMP 09/14/2023   SpO2 97%    Physical Exam Vitals and nursing note reviewed.  Constitutional:      General: She is not in acute distress.    Appearance: She is  not ill-appearing or diaphoretic.  HENT:  Right Ear: Tympanic membrane and ear canal normal.     Left Ear: Tympanic membrane and ear canal normal.     Nose: No rhinorrhea.     Mouth/Throat:     Mouth: Mucous membranes are moist.     Pharynx: Oropharynx is clear. No posterior oropharyngeal erythema.   Eyes:     Conjunctiva/sclera: Conjunctivae normal.    Cardiovascular:     Rate and Rhythm: Normal rate and regular rhythm.     Pulses: Normal pulses.     Heart sounds: Normal heart sounds.  Pulmonary:     Effort: Pulmonary effort is normal. No respiratory distress.     Breath sounds: Normal breath sounds. No wheezing, rhonchi or rales.  Abdominal:     General: There is no distension.     Palpations: Abdomen is soft.   Musculoskeletal:     Cervical back: Normal range of motion.  Lymphadenopathy:     Cervical: No cervical adenopathy.   Skin:    General: Skin is warm and dry.   Neurological:     Mental Status: She is alert and oriented to person, place, and time.      UC Treatments / Results  Labs (all labs ordered are listed, but only abnormal results are displayed) Labs Reviewed - No data to display  EKG   Radiology No results found.  Procedures Procedures (including critical care time)  Medications Ordered in UC Medications - No data to display  Initial Impression / Assessment and Plan / UC Course  I have reviewed the triage vital signs and the nursing notes.  Pertinent labs & imaging results that were available during my care of the patient were reviewed by me and considered in my medical decision making (see chart for details).  Afebrile, well appearing, lungs are clear Vague symptoms. Not having sinus pressure with congestion, mostly post nasal. With 2 week duration treat for mild acute bacterial sinusitis with azithromycin. Other supportive care is advised. Reasons to return to clinic discussed. Patient agrees to plan, no questions   Final Clinical  Impressions(s) / UC Diagnoses   Final diagnoses:  Acute bacterial sinusitis     Discharge Instructions      Please take the azithromycin as directed  Continue the tessalon  perles  The promethazine  DM cough syrup can be used up to 4 times daily. If this medication makes you drowsy, take only once before bed.  Drink lots of fluids! Please return if needed    ED Prescriptions     Medication Sig Dispense Auth. Provider   azithromycin (ZITHROMAX) 250 MG tablet Take 2 tablets together on day 1, then take 1 tablet daily for 4 days 6 tablet Shea Swalley, Ivette Marks, PA-C      PDMP not reviewed this encounter.   Newton Barer 09/25/23 1610

## 2023-09-24 NOTE — ED Triage Notes (Signed)
 Pt c/o dry cough, fatigue, lightheaded, chillsx1wk. Pt states felt nauseated yesterday.

## 2023-09-25 ENCOUNTER — Telehealth: Payer: Self-pay | Admitting: Emergency Medicine

## 2023-09-25 MED ORDER — PROMETHAZINE-DM 6.25-15 MG/5ML PO SYRP: 5.0000 mL | ORAL_SOLUTION | Freq: Four times a day (QID) | ORAL | 0 refills | Status: DC | PRN

## 2023-09-25 NOTE — Telephone Encounter (Signed)
 Promethazine  DM prescription from yesterdays visit was not sent. I have now sent it to the patients pharmacy

## 2023-09-26 ENCOUNTER — Ambulatory Visit: Payer: Self-pay | Admitting: Internal Medicine

## 2023-10-01 ENCOUNTER — Encounter: Payer: Self-pay | Admitting: Internal Medicine

## 2023-10-01 DIAGNOSIS — G8929 Other chronic pain: Secondary | ICD-10-CM | POA: Insufficient documentation

## 2023-10-01 DIAGNOSIS — J309 Allergic rhinitis, unspecified: Secondary | ICD-10-CM | POA: Insufficient documentation

## 2023-10-01 NOTE — Assessment & Plan Note (Signed)
 BMI 49.  She is encouraged to incorporate more exercise into her daily routine and to aim to lose ten percent of her body weight to decrease cardiac risk.

## 2023-10-01 NOTE — Assessment & Plan Note (Signed)
 Chronic ankle pain and inflammation from previous surgery and prolonged standing. Pain manageable; inflammation primary concern. Voltaren gel suggested as alternative to meloxicam . - Use Voltaren gel twice daily instead of meloxicam . - Encourage use of orthotics and soft brace.

## 2023-10-01 NOTE — Assessment & Plan Note (Addendum)
 Calcium  score of 54 and elevated LP(a) suggest coronary artery disease. Trivial mitral valve regurgitation and regional wall motion abnormalities noted on echo. Rosuvastatin  dose increased to target LDL below 70 mg/dL. - Increase rosuvastatin  to 20 mg daily. - Order labs: CBC, A1c, thyroid function. - Consider coronary CT for blockages.

## 2023-10-01 NOTE — Assessment & Plan Note (Signed)
 Chronic, not at goal. Goal BP<120/80.  Elevated blood pressure likely due to occupational stress. Long-standing hypertension indicated by slight left ventricular hypertrophy. Importance of stress management and health prioritization discussed. - Monitor blood pressure at home. - Discuss stress management techniques.

## 2023-10-01 NOTE — Assessment & Plan Note (Signed)
 Chronic, LDL goal is less than 70 due to coronary arterial calcifications. She will continue with rosuvastatin , dose increased to 20mg . Encouraged to follow heart healthy lifestyle.

## 2023-10-01 NOTE — Assessment & Plan Note (Signed)
 Previous labs reviewed, her A1c has been elevated in the past. I will check an A1c today. Reminded to avoid refined sugars including sugary drinks/foods and processed meats including bacon, sausages and deli meats.

## 2023-10-01 NOTE — Assessment & Plan Note (Signed)
 She can start trial of Zyrtec nightly.  - Given rx Tessalon  perles to use prn - Avoid dairy - Notify office if sx persist/worsen

## 2023-10-05 ENCOUNTER — Encounter: Admitting: Gastroenterology

## 2023-10-07 ENCOUNTER — Encounter: Payer: Self-pay | Admitting: Internal Medicine

## 2023-10-12 ENCOUNTER — Ambulatory Visit: Admitting: Obstetrics & Gynecology

## 2023-10-30 ENCOUNTER — Ambulatory Visit (INDEPENDENT_AMBULATORY_CARE_PROVIDER_SITE_OTHER)

## 2023-10-30 ENCOUNTER — Ambulatory Visit
Admission: EM | Admit: 2023-10-30 | Discharge: 2023-10-30 | Disposition: A | Discharge status: Home / Self Care | Attending: Family Medicine | Admitting: Family Medicine

## 2023-10-30 DIAGNOSIS — J329 Chronic sinusitis, unspecified: Secondary | ICD-10-CM

## 2023-10-30 DIAGNOSIS — R051 Acute cough: Secondary | ICD-10-CM

## 2023-10-30 DIAGNOSIS — R053 Chronic cough: Secondary | ICD-10-CM | POA: Diagnosis not present

## 2023-10-30 DIAGNOSIS — J40 Bronchitis, not specified as acute or chronic: Secondary | ICD-10-CM | POA: Diagnosis not present

## 2023-10-30 MED ORDER — PREDNISONE 20 MG PO TABS
ORAL_TABLET | ORAL | 0 refills | Status: DC
Start: 2023-10-30 — End: 2023-12-14

## 2023-10-30 MED ORDER — BENZONATATE 100 MG PO CAPS: 100.0000 mg | ORAL_CAPSULE | Freq: Three times a day (TID) | ORAL | 0 refills | Status: DC | PRN

## 2023-10-30 MED ORDER — DOXYCYCLINE HYCLATE 100 MG PO CAPS
100.0000 mg | ORAL_CAPSULE | Freq: Two times a day (BID) | ORAL | 0 refills | Status: DC
Start: 2023-10-30 — End: 2023-11-10

## 2023-10-30 NOTE — Discharge Instructions (Addendum)
 We are managing sinobronchitis with doxycycline  and prednisone . Continue with supportive care including fluids, rest.

## 2023-10-30 NOTE — ED Triage Notes (Signed)
 Pt reports lingering cough  x 1 month.Cough is worse in the afternoon, she think is drainage. Pt was finished Zpak; promethazine  makes her feel [place].

## 2023-10-30 NOTE — ED Provider Notes (Signed)
 [facility]  Note:  This document was prepared using Conservation officer, historic buildings and may include unintentional dictation errors.  MRN: [id number] DOB: 1975-07-22  Subjective:   Amanda Paul is a 48 y.o. female presenting for 1 month history of persistent cough, sinus drainage.  Patient was seen and treated on 09/24/2023 for acute bacterial sinusitis.  Has a history of chronic rhinitis.  No asthma.  No shortness of breath, wheezing. No smoking of any kind including cigarettes, cigars, vaping, marijuana use.    No current facility-administered medications for this encounter.  Current Outpatient Medications:    amLODipine  (NORVASC ) 10 MG tablet, TAKE 1 TABLET BY MOUTH EVERY DAY, Disp: 90 tablet, Rfl: 3   azithromycin  (ZITHROMAX ) 250 MG tablet, Take 2 tablets together on day 1, then take 1 tablet daily for 4 days, Disp: 6 tablet, Rfl: 0   benzonatate  (TESSALON  PERLES) 100 MG capsule, Take 1 capsule (100 mg total) by mouth 3 (three) times daily as needed for cough., Disp: 30 capsule, Rfl: 0   meloxicam  (MOBIC ) 15 MG tablet, TAKE 1 TABLET (15 MG TOTAL) BY MOUTH DAILY., Disp: 30 tablet, Rfl: 3   promethazine -dextromethorphan (PROMETHAZINE -DM) 6.25-15 MG/5ML syrup, Take 5 mLs by mouth 4 (four) times daily as needed for cough., Disp: 240 mL, Rfl: 0   rosuvastatin  (CRESTOR ) 20 MG tablet, Take 1 tablet (20 mg total) by mouth daily., Disp: 90 tablet, Rfl: 1   Vitamin D , Ergocalciferol , (DRISDOL ) 1.25 MG (50000 UNIT) CAPS capsule, Take 1 capsule (50,000 Units total) by mouth every 7 (seven) days., Disp: 12 capsule, Rfl: 1   Allergies  Allergen Reactions   Amoxicillin Hives, Itching and Swelling    Swollen eyes and throat.   Penicillins Hives and Swelling    Past Medical History:  Diagnosis Date   Allergy    Arthritis    Hernia, umbilical    Hyperlipidemia    Hypertension    Morbid obesity (HCC)    Uterine fibroids      Past Surgical History:   Procedure Laterality Date   ADVANCEMENT / RECONSTRUCTION POSTERIOR TIBIAL TENDON / [facility] Left 10/09/2021   COLONOSCOPY WITH PROPOFOL  N/A 06/13/2020   Procedure: COLONOSCOPY WITH PROPOFOL ;  Surgeon: Rollin Dover, MD;  Location: [facility];  Service: Endoscopy;  Laterality: N/A;   HEMOSTASIS CLIP PLACEMENT  06/13/2020   Procedure: HEMOSTASIS CLIP PLACEMENT;  Surgeon: Rollin Dover, MD;  Location: [facility];  Service: Endoscopy;;   HERNIA REPAIR     HYSTEROSCOPY WITH D & C N/A 08/26/2014   Procedure: DILATATION AND CURETTAGE /HYSTEROSCOPY and cervical repair of cervical laceration ;  Surgeon: Nena App, MD;  Location: [facility];  Service: Gynecology;  Laterality: N/A;   IR ANGIOGRAM PELVIS SELECTIVE OR SUPRASELECTIVE  10/08/2020   IR ANGIOGRAM PELVIS SELECTIVE OR SUPRASELECTIVE  10/08/2020   IR ANGIOGRAM SELECTIVE EACH ADDITIONAL VESSEL  10/08/2020   IR EMBO TUMOR ORGAN ISCHEMIA INFARCT INC GUIDE ROADMAPPING  10/08/2020   IR RADIOLOGIST EVAL & MGMT  07/10/2020   IR RADIOLOGIST EVAL & MGMT  07/31/2020   IR RADIOLOGIST EVAL & MGMT  09/09/2020   IR RADIOLOGIST EVAL & MGMT  11/18/2020   IR RADIOLOGIST EVAL & MGMT  09/14/2021   IR US  GUIDE VASC ACCESS RIGHT  10/08/2020   POLYPECTOMY  06/13/2020   Procedure: POLYPECTOMY;  Surgeon: Rollin Dover, MD;  Location: [facility];  Service: Endoscopy;;   UMBILICAL HERNIA REPAIR     WISDOM TOOTH EXTRACTION  Family History  Problem Relation Age of Onset   Hypertension Mother    Colon polyps Neg Hx    Colon cancer Neg Hx    Esophageal cancer Neg Hx    Rectal cancer Neg Hx    Stomach cancer Neg Hx     Social History   Tobacco Use   Smoking status: Never   Smokeless tobacco: Never  Vaping Use   Vaping status: Never Used  Substance Use Topics   Alcohol use: Yes    Alcohol/week: 1.0 standard drink of alcohol    Types: 1 Cans of beer per week    Comment: rare   Drug use: No    ROS   Objective:   Vitals: BP (!) 141/84 (BP  Location: Right Arm)   Pulse 100   Temp 99.8 F (37.7 C) (Oral)   Resp 20   SpO2 94%   Physical Exam Constitutional:      General: She is not in acute distress.    Appearance: Normal appearance. She is well-developed. She is not ill-appearing, toxic-appearing or diaphoretic.  HENT:     Head: Normocephalic and atraumatic.     Nose: Nose normal.     Mouth/Throat:     Mouth: Mucous membranes are moist.     Pharynx: No pharyngeal swelling, oropharyngeal exudate, posterior oropharyngeal erythema or uvula swelling.     Tonsils: No tonsillar exudate or tonsillar abscesses. 0 on the right. 0 on the left.     Comments: Cobblestone pattern postnasal drainage overlying pharynx. Eyes:     General: No scleral icterus.       Right eye: No discharge.        Left eye: No discharge.     Extraocular Movements: Extraocular movements intact.  Cardiovascular:     Rate and Rhythm: Normal rate and regular rhythm.     Heart sounds: Normal heart sounds. No murmur heard.    No friction rub. No gallop.  Pulmonary:     Effort: Pulmonary effort is normal. No respiratory distress.     Breath sounds: No stridor. No wheezing, rhonchi or rales.  Chest:     Chest wall: No tenderness.  Skin:    General: Skin is warm and dry.  Neurological:     General: No focal deficit present.     Mental Status: She is alert and oriented to person, place, and time.  Psychiatric:        Mood and Affect: Mood normal.        Behavior: Behavior normal.     Assessment and Plan :   PDMP not reviewed this encounter.  1. Sinobronchitis   2. Acute cough   3. Persistent cough for 3 weeks or longer    Recommended managing for sinobronchitis with doxycycline  and prednisone .  Patient came in a closing, I could not access the images.  Overread is pending.  Counseled patient on potential for adverse effects with medications prescribed/recommended today, ER and return-to-clinic precautions discussed, patient verbalized  understanding.    Christopher Savannah, [place] 10/30/23 1610

## 2023-11-02 ENCOUNTER — Ambulatory Visit: Payer: Self-pay | Admitting: Urgent Care

## 2023-11-07 ENCOUNTER — Encounter: Payer: Self-pay | Admitting: Internal Medicine

## 2023-11-10 ENCOUNTER — Ambulatory Visit
Admission: EM | Admit: 2023-11-10 | Discharge: 2023-11-10 | Disposition: A | Discharge status: Home / Self Care | Attending: Family Medicine | Admitting: Family Medicine

## 2023-11-10 DIAGNOSIS — M7989 Other specified soft tissue disorders: Secondary | ICD-10-CM

## 2023-11-10 MED ORDER — FUROSEMIDE 40 MG PO TABS
40.0000 mg | ORAL_TABLET | Freq: Once | ORAL | Status: AC
  Administered 2023-11-10: 40 mg via ORAL

## 2023-11-10 NOTE — ED Provider Notes (Addendum)
 [facility]    CSN: [id number] Arrival date & time: 11/10/23  0935      History   Chief Complaint Chief Complaint  Patient presents with   Allergic Reaction   Foot Swelling    HPI Amanda Paul is a 48 y.o. female with a past medical Struve hypertension, hyperlipidemia, environmental allergies, morbid obesity presents for lower extremity swelling.  Patient was seen in urgent care on July 20 and treated with doxycycline  and prednisone  for sinobronchitis.  She took her last dose of doxycycline  today.  She states shortly after starting the doxycycline  she developed lower extremity swelling.  She states she normally has some swelling in her left foot after working and standing on her feet due to a history of tendon surgery but normal does not have bilateral lower extremity swelling.  She states for the past few days it seems to have improved with elevation but last night did not seem to work as well.  She denies any pain to the extremities, no calf pain, no erythema or warmth of the legs, no chest pain, shortness of breath, orthopnea, dysuria.  No rashes.  Denies any history of CHF, PVD or PAD or lower extremity swelling in general.  Diet or new medications.  She has been on amlodipine  10 mg for over 2 years.  She has taken doxycycline  and prednisone  in the past without issue.  She states she spoke to her pharmacist who told her that swelling could be a side effect of the doxycycline .  She took her last dose of doxycycline .  She overall feels well.  She has been wearing compression socks starting today.  No other concerns at this time   Allergic Reaction   Past Medical History:  Diagnosis Date   Allergy    Arthritis    Hernia, umbilical    Hyperlipidemia    Hypertension    Morbid obesity (HCC)    Uterine fibroids     Patient Active Problem List   Diagnosis Date Noted   Chronic pain of both ankles 10/01/2023   Allergic rhinitis with postnasal drip 10/01/2023    Encounter for general adult medical examination w/o abnormal findings 09/20/2023   Hypertensive heart disease without heart failure 09/20/2023   Prediabetes 03/21/2023   Class 3 severe obesity due to excess calories with serious comorbidity and body mass index (BMI) of 45.0 to 49.9 in adult 03/21/2023   Pure hypercholesterolemia 03/21/2023   Coronary artery calcification 03/21/2023   Enlarged liver 03/21/2023   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   Uterine leiomyoma 10/08/2020   Cervical high risk HPV (human papillomavirus) test positive 07/31/2019   Pain in right hand 01/19/2019   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   Fibroids 09/30/2015   Hypertension 05/10/2012   Morbid obesity (HCC)    Umbilical hernia 04/14/2012   Abnormal cervical Papanicolaou smear 05/13/2002   Low grade squamous intraepithelial lesion (LGSIL) on cervicovaginal cytologic smear 06/10/2000    Past Surgical History:  Procedure Laterality Date   ADVANCEMENT / RECONSTRUCTION POSTERIOR TIBIAL TENDON / [facility] Left 10/09/2021   COLONOSCOPY WITH PROPOFOL  N/A 06/13/2020   Procedure: COLONOSCOPY WITH PROPOFOL ;  Surgeon: Rollin Dover, MD;  [facility];  Service: Endoscopy;  Laterality: N/A;   HEMOSTASIS CLIP PLACEMENT  06/13/2020   Procedure: HEMOSTASIS CLIP PLACEMENT;  Surgeon: Rollin Dover, MD;  Location: [facility];  Service: Endoscopy;;   HERNIA REPAIR     HYSTEROSCOPY WITH D & C N/A 08/26/2014   Procedure: DILATATION AND CURETTAGE /HYSTEROSCOPY and cervical  repair of cervical laceration ;  Surgeon: Nena App, MD;  Location: [facility];  Service: Gynecology;  Laterality: N/A;   IR ANGIOGRAM PELVIS SELECTIVE OR SUPRASELECTIVE  10/08/2020   IR ANGIOGRAM PELVIS SELECTIVE OR SUPRASELECTIVE  10/08/2020   IR ANGIOGRAM SELECTIVE EACH ADDITIONAL VESSEL  10/08/2020   IR EMBO TUMOR ORGAN ISCHEMIA INFARCT INC GUIDE ROADMAPPING  10/08/2020   IR RADIOLOGIST EVAL & MGMT  07/10/2020   IR RADIOLOGIST EVAL & MGMT  07/31/2020    IR RADIOLOGIST EVAL & MGMT  09/09/2020   IR RADIOLOGIST EVAL & MGMT  11/18/2020   IR RADIOLOGIST EVAL & MGMT  09/14/2021   IR US  GUIDE VASC ACCESS RIGHT  10/08/2020   POLYPECTOMY  06/13/2020   Procedure: POLYPECTOMY;  Surgeon: Rollin Dover, MD;  Location: [facility];  Service: Endoscopy;;   UMBILICAL HERNIA REPAIR     WISDOM TOOTH EXTRACTION      OB History     Gravida  0   Para  0   Term  0   Preterm  0   AB  0   Living  0      SAB  0   IAB  0   Ectopic  0   Multiple  0   Live Births               Home Medications    Prior to Admission medications   Medication Sig Start Date End Date Taking? Authorizing Provider  amLODipine  (NORVASC ) 10 MG tablet TAKE 1 TABLET BY MOUTH EVERY DAY 06/27/23   Jarold Medici, MD  azithromycin  (ZITHROMAX ) 250 MG tablet Take 2 tablets together on day 1, then take 1 tablet daily for 4 days 09/24/23   Rising, Asberry, PA-C  benzonatate  (TESSALON  PERLES) 100 MG capsule Take 1 capsule (100 mg total) by mouth 3 (three) times daily as needed for cough. 10/30/23 10/29/24  Christopher Savannah, PA-C  meloxicam  (MOBIC ) 15 MG tablet TAKE 1 TABLET (15 MG TOTAL) BY MOUTH DAILY. 09/19/23   Hyatt, Max T, DPM  predniSONE  (DELTASONE ) 20 MG tablet Take 2 tablets daily with breakfast. 10/30/23   Christopher Savannah, PA-C  rosuvastatin  (CRESTOR ) 20 MG tablet Take 1 tablet (20 mg total) by mouth daily. 09/20/23 09/19/24  Jarold Medici, MD  Vitamin D , Ergocalciferol , (DRISDOL ) 1.25 MG (50000 UNIT) CAPS capsule Take 1 capsule (50,000 Units total) by mouth every 7 (seven) days. 10/05/21   Jarold Medici, MD    Family History Family History  Problem Relation Age of Onset   Hypertension Mother    Colon polyps Neg Hx    Colon cancer Neg Hx    Esophageal cancer Neg Hx    Rectal cancer Neg Hx    Stomach cancer Neg Hx     Social History Social History   Tobacco Use   Smoking status: Never   Smokeless tobacco: Never  Vaping Use   Vaping status: Never Used  Substance  Use Topics   Alcohol use: Yes    Alcohol/week: 1.0 standard drink of alcohol    Types: 1 Cans of beer per week    Comment: rare   Drug use: No     Allergies   Amoxicillin, Doxycycline  hyclate, and Penicillins   Review of Systems Review of Systems  Skin:        Lower extremity swelling     Physical Exam Triage Vital Signs ED Triage Vitals [11/10/23 1117]  Encounter Vitals Group     BP 139/85     Girls  Systolic BP Percentile      Girls Diastolic BP Percentile      Boys Systolic BP Percentile      Boys Diastolic BP Percentile      Pulse Rate 83     Resp 19     Temp 98.3 F (36.8 C)     Temp Source Oral     SpO2 93 %     Weight      Height      Head Circumference      Peak Flow      Pain Score      Pain Loc      Pain Education      Exclude from Growth Chart    No data found.  Updated Vital Signs BP 139/85 (BP Location: Right Arm)   Pulse 83   Temp 98.3 F (36.8 C) (Oral)   Resp 19   SpO2 93%   Visual Acuity Right Eye Distance:   Left Eye Distance:   Bilateral Distance:    Right Eye Near:   Left Eye Near:    Bilateral Near:     Physical Exam Vitals and nursing note reviewed.  Constitutional:      General: She is not in acute distress.    Appearance: Normal appearance. She is obese. She is not ill-appearing, toxic-appearing or diaphoretic.  HENT:     Head: Normocephalic and atraumatic.  Eyes:     Pupils: Pupils are equal, round, and reactive to light.  Cardiovascular:     Rate and Rhythm: Normal rate and regular rhythm.     Heart sounds: Normal heart sounds.  Pulmonary:     Effort: Pulmonary effort is normal.     Breath sounds: Normal breath sounds.  Musculoskeletal:     Right lower leg: Swelling present. No tenderness. 2+ Pitting Edema present.     Left lower leg: Swelling present. No tenderness. 2+ Pitting Edema present.     Comments: +2 pitting edema from ankle to just below the knee bilaterally.  There is no erythema warmth or posterior  calf tenderness.  Right calf measures 48 cm left calf measures 49 cm.  DP +2 bilateral.  Skin is warm and dry.  No rashes.  Skin:    General: Skin is warm and dry.  Neurological:     General: No focal deficit present.     Mental Status: She is alert and oriented to person, place, and time.  Psychiatric:        Mood and Affect: Mood normal.        Behavior: Behavior normal.      UC Treatments / Results  Labs (all labs ordered are listed, but only abnormal results are displayed) Labs Reviewed - No data to display  CMP14+EGFR Order: [id number]  Status: Final result     Next appt: 11/30/2023 at 08:20 AM in Internal Medicine [facility])     Dx: Encounter for general adult medical e...   Test Result Released: Yes (seen)     Messages: Seen   1 Result Note     1 Patient Communication     View Follow-Up Encounter          Component Ref Range & Units (hover) 1 mo ago (09/20/23) 7 mo ago (03/21/23) 1 yr ago (09/15/22) 1 yr ago (01/18/22) 1 yr ago (12/03/21) 2 yr ago (09/09/21) 2 yr ago (05/06/21)  Glucose 75 72 78 77 84 R 71 75  BUN 10 19 12 14  9  16  Creatinine, Ser 0.59 0.64 0.54 Low  0.52 Low   0.49 Low  0.62  eGFR 110 108 113 115  116 110  BUN/Creatinine Ratio 17 30 High  22 27 High   18 26 High   Sodium 136 142 138 139  136 143  Potassium 4.1 4.3 4.3 4.5  4.1 4.2  Chloride 97 102 102 100  100 105  CO2 22 23 21 21  21 21   Calcium  9.3 9.7 9.2 9.6  9.2 9.2  Total Protein 8.4 8.4 7.7   7.6   Albumin 4.2 4.5 4.4   4.5 R   Globulin, Total 4.2 3.9 3.3   3.1   Bilirubin Total 0.4 0.3 0.3   0.3   Alkaline Phosphatase 72 77 63   55   AST 14 21 17   21    ALT 13 21 14   15    Resulting Agency [facility]         Narrative Performed by: HOYT Performed at:  [address] [facility] [address] [place], [place]  [id number] Lab Director: Frankey Sas MD, Phone:  [phone]  Specimen Collected: 09/20/23 17:17 Last Resulted:  09/21/23 02:35    EKG   Radiology No results found.  Procedures Procedures (including critical care time)  Medications Ordered in UC Medications  furosemide  (LASIX ) tablet 40 mg (40 mg Oral Given 11/10/23 1148)    Initial Impression / Assessment and Plan / UC Course  I have reviewed the triage vital signs and the nursing notes.  Pertinent labs & imaging results that were available during my care of the patient were reviewed by me and considered in my medical decision making (see chart for details).     I reviewed exam and symptoms with patient.  Discussed multiple potential causes of lower extremity swelling including cardiac, renal, medication use, dietary changes, etc.  She denies any history of CHF or vascular disease.  Recent labs from end of June shows normal kidney and liver function.  She denies any chest pain orthopnea or shortness of breath.  Unclear if this is caused by her doxycycline .  Did update her allergy list to include doxycycline .   Patient is hemodynamically stable and well-appearing in clinic.  She was given 1 dose of Lasix  in clinic and advised her to wear her compression stockings and elevate her legs frequently.  She is to call her PCP today make a follow-up appointment ASAP for further evaluation.  She was instructed to go to the ER for any worsening symptoms/red flags reviewed and she verbalized understanding. Final Clinical Impressions(s) / UC Diagnoses   Final diagnoses:  Swelling of lower extremity     Discharge Instructions      You are given a single dose of a diuretic in the clinic to help remove some of the fluid from your legs.  Continue wearing your compression stockings and elevate your legs frequently.  Please contact your PCP to make a follow-up appointment ASAP to make sure symptoms are improving.  Please go to the ER if you develop any worsening symptoms.  This includes but is not limited to worsening swelling, pain in your legs, shortness of  breath, difficulty breathing when you lay flat, chest pain, or any new concerns that arise.  Hope you feel better soon!     ED Prescriptions   None    PDMP not reviewed this encounter.   Loreda Myla SAUNDERS, NP 11/10/23 1151    Jazmine Heckman, Jodi  R, NP 11/10/23 1152    Loreda Myla SAUNDERS, NP 11/10/23 1153

## 2023-11-10 NOTE — ED Triage Notes (Addendum)
 Pt present with a reaction to doxycycline . Pt states she has had bilateral leg and foot swelling. Denies itching and pain. States she is on her feet all day at work.

## 2023-11-10 NOTE — Discharge Instructions (Signed)
 You are given a single dose of a diuretic in the clinic to help remove some of the fluid from your legs.  Continue wearing your compression stockings and elevate your legs frequently.  Please contact your PCP to make a follow-up appointment ASAP to make sure symptoms are improving.  Please go to the ER if you develop any worsening symptoms.  This includes but is not limited to worsening swelling, pain in your legs, shortness of breath, difficulty breathing when you lay flat, chest pain, or any new concerns that arise.  Hope you feel better soon!

## 2023-11-15 ENCOUNTER — Encounter: Payer: Self-pay | Admitting: Internal Medicine

## 2023-11-16 ENCOUNTER — Ambulatory Visit (INDEPENDENT_AMBULATORY_CARE_PROVIDER_SITE_OTHER): Payer: Self-pay | Admitting: Internal Medicine

## 2023-11-16 VITALS — BP 122/84 | HR 98 | Temp 98.4°F | Ht 65.0 in | Wt 294.6 lb

## 2023-11-16 DIAGNOSIS — R600 Localized edema: Secondary | ICD-10-CM

## 2023-11-16 DIAGNOSIS — R053 Chronic cough: Secondary | ICD-10-CM

## 2023-11-16 DIAGNOSIS — I119 Hypertensive heart disease without heart failure: Secondary | ICD-10-CM | POA: Diagnosis not present

## 2023-11-16 DIAGNOSIS — I2510 Atherosclerotic heart disease of native coronary artery without angina pectoris: Secondary | ICD-10-CM | POA: Diagnosis not present

## 2023-11-16 DIAGNOSIS — E7800 Pure hypercholesterolemia, unspecified: Secondary | ICD-10-CM

## 2023-11-16 DIAGNOSIS — E66813 Obesity, class 3: Secondary | ICD-10-CM

## 2023-11-16 DIAGNOSIS — R7303 Prediabetes: Secondary | ICD-10-CM

## 2023-11-16 MED ORDER — FUROSEMIDE 40 MG PO TABS
ORAL_TABLET | ORAL | 0 refills | Status: DC
Start: 2023-11-16 — End: 2023-12-08

## 2023-11-16 NOTE — Progress Notes (Signed)
 I,Victoria T Emmitt, CMA,acting as a Neurosurgeon for Catheryn LOISE Slocumb, MD.,have documented all relevant documentation on the behalf of Catheryn LOISE Slocumb, MD,as directed by  Catheryn LOISE Slocumb, MD while in the presence of Catheryn LOISE Slocumb, MD.  Subjective:  Patient ID: Amanda Paul , female    DOB: 1976-03-22 , 48 y.o.   MRN: [id number]  Chief Complaint  Patient presents with   Hypertension    Patient presents today for bp. Predm & cholesterol follow up. She reports compliance with medications. Denies headache, chest pain & sob. While here today she complains of swelling in legs. Noticed 2 weeks ago. She states noticing this after being rx Doxycycline . She completed medication last Wednesday. She went to urgent care on Thursday, after being seen she was given one dose of Lasix , water pill. She admits seeing improvement on Friday but still experiencing swelling.    Prediabetes   Hyperlipidemia    HPI Discussed the use of AI scribe software for clinical note transcription with the patient, who gave verbal consent to proceed.  History of Present Illness Amanda Paul is a 48 year old female with hypertension who presents with lower extremity swelling and a lingering cough.  She was treated for bronchitis at an urgent care on [facility] on July 20th, where she was prescribed doxycycline . After completing the course, she noticed swelling in her lower extremities. She returned on 7/31 for further evaluation of LE swelling.  She was given rx lasix , one dose helped to relieve her symptoms.  She wears medical-grade compression hose due to being on her feet frequently at work, although she has not been measured for these. She plans to visit her foot doctor for proper fitting.  She did not experience shortness of breath related to the swelling and initially visited urgent care two weeks ago after her physical. She was prescribed a cough syrup, which she found too strong, causing her to feel  groggy at work. She continues to have a lingering cough, described as a 'tickle in the throat'. A chest x-ray was performed, showing no signs of pneumonia. The doxycycline  helped clear up drainage, and she continues to take Tessalon  Pearls and an allergy pill for drainage.  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 She has been working extra shifts due to being short-staffed, affecting her rest and hydration. No shortness of breath related to the swelling but she keeps her head elevated on two to three pillows for neck comfort. She reports leg cramps at night when not adequately hydrated.   Hypertension This is a chronic problem. The current episode started more than 1 year ago. The problem has been gradually improving since onset. The problem is controlled. Pertinent negatives include no blurred vision or palpitations. Risk factors for coronary artery disease include dyslipidemia and obesity. Past treatments include calcium  channel blockers.     Past Medical History:  Diagnosis Date   Allergy    Arthritis    Hernia, umbilical    Hyperlipidemia    Hypertension    Morbid obesity (HCC)    Uterine fibroids      Family History  Problem Relation Age of Onset   Hypertension Mother    Colon polyps Neg Hx    Colon cancer Neg Hx    Esophageal cancer Neg Hx    Rectal cancer Neg Hx    Stomach cancer Neg Hx  Current Outpatient Medications:    furosemide  (LASIX ) 40 MG tablet, One tab po every day prn, Disp: 30 tablet, Rfl: 0   amLODipine  (NORVASC ) 10 MG tablet, TAKE 1 TABLET BY MOUTH EVERY DAY, Disp: 90 tablet, Rfl: 3   azithromycin  (ZITHROMAX ) 250 MG tablet, Take 2 tablets together on day 1, then take 1 tablet daily for 4 days, Disp: 6 tablet, Rfl: 0   benzonatate  (TESSALON  PERLES) 100 MG capsule, Take 1 capsule (100 mg total) by mouth 3 (three) times daily as needed for cough., Disp: 30 capsule, Rfl: 0    meloxicam  (MOBIC ) 15 MG tablet, TAKE 1 TABLET (15 MG TOTAL) BY MOUTH DAILY., Disp: 30 tablet, Rfl: 3   polyethylene glycol-electrolytes (NULYTELY) 420 g solution, Take 4,000 mLs by mouth once. (Patient not taking: Reported on 11/16/2023), Disp: , Rfl:    predniSONE  (DELTASONE ) 20 MG tablet, Take 2 tablets daily with breakfast., Disp: 10 tablet, Rfl: 0   rosuvastatin  (CRESTOR ) 20 MG tablet, Take 1 tablet (20 mg total) by mouth daily., Disp: 90 tablet, Rfl: 1   Vitamin D , Ergocalciferol , (DRISDOL ) 1.25 MG (50000 UNIT) CAPS capsule, Take 1 capsule (50,000 Units total) by mouth every 7 (seven) days., Disp: 12 capsule, Rfl: 1   Allergies  Allergen Reactions   Amoxicillin Hives, Itching and Swelling    Swollen eyes and throat.   Doxycycline  Hyclate Swelling    Bilateral lower leg swelling   Penicillins Hives and Swelling     Review of Systems  Constitutional: Negative.   HENT: Negative.    Eyes:  Negative for blurred vision.  Respiratory:  Positive for cough.   Cardiovascular:  Positive for leg swelling. Negative for palpitations.  Neurological: Negative.   Psychiatric/Behavioral: Negative.       Today's Vitals   11/16/23 1514  BP: 122/84  Pulse: 98  Temp: 98.4 F (36.9 C)  SpO2: 98%  Weight: 294 lb 9.6 oz (133.6 kg)  Height: 5' 5 (1.651 m)   Body mass index is 49.02 kg/m.  Wt Readings from Last 3 Encounters:  11/16/23 294 lb 9.6 oz (133.6 kg)  09/20/23 298 lb (135.2 kg)  09/16/23 288 lb (130.6 kg)     Objective:  Physical Exam Vitals and nursing note reviewed.  Constitutional:      Appearance: Normal appearance. She is obese.  HENT:     Head: Normocephalic and atraumatic.  Eyes:     Extraocular Movements: Extraocular movements intact.  Cardiovascular:     Rate and Rhythm: Normal rate and regular rhythm.     Heart sounds: Normal heart sounds.  Pulmonary:     Effort: Pulmonary effort is normal.     Breath sounds: Normal breath sounds.  Musculoskeletal:     Cervical  back: Normal range of motion.  Skin:    General: Skin is warm.  Neurological:     General: No focal deficit present.     Mental Status: She is alert.  Psychiatric:        Mood and Affect: Mood normal.        Behavior: Behavior normal.         Assessment And Plan:  Hypertensive heart disease without heart failure Assessment & Plan: Hypertension controlled; amlodipine  likely causing edema. Swelling resolves with Lasix .  - Prescribe Lasix  as needed, avoid consecutive days. - Advise wearing medical-grade compression hose, especially at work. - Encourage frequent hydration to prevent dehydration and leg cramps. - Consider FMLA for hypertension management to allow necessary time off work.  Orders: -     [id number]  Coronary artery calcification Assessment & Plan: Calcium  score of 54 and elevated LP(a) suggest coronary artery disease. Trivial mitral valve regurgitation and regional wall motion abnormalities noted on echo. - She will continue with rosuvastatin  - Follow heart healthy lifestyle.    Persistent cough Assessment & Plan: Previous doxycycline  effective for bronchitis. Chest X-ray negative for pneumonia. Current management includes Tessalon  Pearls and Zyrtec. - Provide Norel AD samples for decongestion. - Check kidney function and potassium levels due to Lasix  use. - Continue over-the-counter Zyrtec for allergy management.   Peripheral edema Assessment & Plan: She is encouraged to elevate her legs when seated and to wear medical grade compression hose while at work.  - Use Lasix  prn   Other orders -     Furosemide ; One tab po every day prn  Dispense: 30 tablet; Refill: 0    Return if symptoms worsen or fail to improve.  Patient was given opportunity to ask questions. Patient verbalized understanding of the plan and was able to repeat key elements of the plan. All questions were answered to their satisfaction.   I, Catheryn LOISE Slocumb, MD, have reviewed all  documentation for this visit. The documentation on 11/16/23 for the exam, diagnosis, procedures, and orders are all accurate and complete.   IF YOU HAVE BEEN REFERRED TO A SPECIALIST, IT MAY TAKE 1-2 WEEKS TO SCHEDULE/PROCESS THE REFERRAL. IF YOU HAVE NOT HEARD FROM US /SPECIALIST IN TWO WEEKS, PLEASE GIVE US  A CALL AT [phone].   THE PATIENT IS ENCOURAGED TO PRACTICE SOCIAL DISTANCING DUE TO THE COVID-19 PANDEMIC.

## 2023-11-16 NOTE — Patient Instructions (Signed)
 Hypertension, Adult Hypertension is another name for high blood pressure. High blood pressure forces your heart to work harder to pump blood. This can cause problems over time. There are two numbers in a blood pressure reading. There is a top number (systolic) over a bottom number (diastolic). It is best to have a blood pressure that is below 120/80. What are the causes? The cause of this condition is not known. Some other conditions can lead to high blood pressure. What increases the risk? Some lifestyle factors can make you more likely to develop high blood pressure: Smoking. Not getting enough exercise or physical activity. Being overweight. Having too much fat, sugar, calories, or salt (sodium) in your diet. Drinking too much alcohol. Other risk factors include: Having any of these conditions: Heart disease. Diabetes. High cholesterol. Kidney disease. Obstructive sleep apnea. Having a family history of high blood pressure and high cholesterol. Age. The risk increases with age. Stress. What are the signs or symptoms? High blood pressure may not cause symptoms. Very high blood pressure (hypertensive crisis) may cause: Headache. Fast or uneven heartbeats (palpitations). Shortness of breath. Nosebleed. Vomiting or feeling like you may vomit (nauseous). Changes in how you see. Very bad chest pain. Feeling dizzy. Seizures. How is this treated? This condition is treated by making healthy lifestyle changes, such as: Eating healthy foods. Exercising more. Drinking less alcohol. Your doctor may prescribe medicine if lifestyle changes do not help enough and if: Your top number is above 130. Your bottom number is above 80. Your personal target blood pressure may vary. Follow these instructions at home: Eating and drinking  If told, follow the DASH eating plan. To follow this plan: Fill one half of your plate at each meal with fruits and vegetables. Fill one fourth of your plate  at each meal with whole grains. Whole grains include whole-wheat pasta, brown rice, and whole-grain bread. Eat or drink low-fat dairy products, such as skim milk or low-fat yogurt. Fill one fourth of your plate at each meal with low-fat (lean) proteins. Low-fat proteins include fish, chicken without skin, eggs, beans, and tofu. Avoid fatty meat, cured and processed meat, or chicken with skin. Avoid pre-made or processed food. Limit the amount of salt in your diet to less than 1,500 mg each day. Do not drink alcohol if: Your doctor tells you not to drink. You are pregnant, may be pregnant, or are planning to become pregnant. If you drink alcohol: Limit how much you have to: 0-1 drink a day for women. 0-2 drinks a day for men. Know how much alcohol is in your drink. In the [place] one drink equals one 12 oz bottle of beer (355 mL), one 5 oz glass of wine (148 mL), or one 1 oz glass of hard liquor (44 mL). Lifestyle  Work with your doctor to stay at a healthy weight or to lose weight. Ask your doctor what the best weight is for you. Get at least 30 minutes of exercise that causes your heart to beat faster (aerobic exercise) most days of the week. This may include walking, swimming, or biking. Get at least 30 minutes of exercise that strengthens your muscles (resistance exercise) at least 3 days a week. This may include lifting weights or doing Pilates. Do not smoke or use any products that contain nicotine or tobacco. If you need help quitting, ask your doctor. Check your blood pressure at home as told by your doctor. Keep all follow-up visits. Medicines Take over-the-counter and prescription medicines  only as told by your doctor. Follow directions carefully. Do not skip doses of blood pressure medicine. The medicine does not work as well if you skip doses. Skipping doses also puts you at risk for problems. Ask your doctor about side effects or reactions to medicines that you should watch  for. Contact a doctor if: You think you are having a reaction to the medicine you are taking. You have headaches that keep coming back. You feel dizzy. You have swelling in your ankles. You have trouble with your vision. Get help right away if: You get a very bad headache. You start to feel mixed up (confused). You feel weak or numb. You feel faint. You have very bad pain in your: Chest. Belly (abdomen). You vomit more than once. You have trouble breathing. These symptoms may be an emergency. Get help right away. Call 911. Do not wait to see if the symptoms will go away. Do not drive yourself to the hospital. Summary Hypertension is another name for high blood pressure. High blood pressure forces your heart to work harder to pump blood. For most people, a normal blood pressure is less than 120/80. Making healthy choices can help lower blood pressure. If your blood pressure does not get lower with healthy choices, you may need to take medicine. This information is not intended to replace advice given to you by your health care provider. Make sure you discuss any questions you have with your health care provider. Document Revised: 01/15/2021 Document Reviewed: 01/15/2021 Elsevier Patient Education  2024 [employer].

## 2023-11-17 LAB — BMP8+EGFR
BUN/Creatinine Ratio: 19 (ref 9–23)
BUN: 10 mg/dL (ref 6–24)
CO2: 22 mmol/L (ref 20–29)
Calcium: 9.4 mg/dL (ref 8.7–10.2)
Chloride: 93 mmol/L — ABNORMAL LOW (ref 96–106)
Creatinine, Ser: 0.53 mg/dL — ABNORMAL LOW (ref 0.57–1.00)
Glucose: 89 mg/dL (ref 70–99)
Potassium: 4 mmol/L (ref 3.5–5.2)
Sodium: 135 mmol/L (ref 134–144)
eGFR: 113 mL/min/1.73 (ref >59)

## 2023-11-20 ENCOUNTER — Encounter: Payer: Self-pay | Admitting: Internal Medicine

## 2023-11-20 ENCOUNTER — Ambulatory Visit: Payer: Self-pay | Admitting: Internal Medicine

## 2023-11-20 DIAGNOSIS — R600 Localized edema: Secondary | ICD-10-CM | POA: Insufficient documentation

## 2023-11-20 DIAGNOSIS — R053 Chronic cough: Secondary | ICD-10-CM | POA: Insufficient documentation

## 2023-11-20 NOTE — Assessment & Plan Note (Signed)
 Hypertension controlled; amlodipine  likely causing edema. Swelling resolves with Lasix .  - Prescribe Lasix  as needed, avoid consecutive days. - Advise wearing medical-grade compression hose, especially at work. - Encourage frequent hydration to prevent dehydration and leg cramps. - Consider FMLA for hypertension management to allow necessary time off work.

## 2023-11-20 NOTE — Assessment & Plan Note (Signed)
 Calcium  score of 54 and elevated LP(a) suggest coronary artery disease. Trivial mitral valve regurgitation and regional wall motion abnormalities noted on echo. - She will continue with rosuvastatin  - Follow heart healthy lifestyle.

## 2023-11-20 NOTE — Assessment & Plan Note (Signed)
 She is encouraged to elevate her legs when seated and to wear medical grade compression hose while at work.  - Use Lasix  prn

## 2023-11-20 NOTE — Assessment & Plan Note (Signed)
 Previous doxycycline  effective for bronchitis. Chest X-ray negative for pneumonia. Current management includes Tessalon  Pearls and Zyrtec. - Provide Norel AD samples for decongestion. - Check kidney function and potassium levels due to Lasix  use. - Continue over-the-counter Zyrtec for allergy management.

## 2023-11-22 ENCOUNTER — Other Ambulatory Visit: Payer: Self-pay | Admitting: Obstetrics and Gynecology

## 2023-11-22 DIAGNOSIS — D219 Benign neoplasm of connective and other soft tissue, unspecified: Secondary | ICD-10-CM

## 2023-11-24 ENCOUNTER — Emergency Department (HOSPITAL_COMMUNITY)

## 2023-11-24 ENCOUNTER — Ambulatory Visit (INDEPENDENT_AMBULATORY_CARE_PROVIDER_SITE_OTHER): Admitting: Podiatry

## 2023-11-24 ENCOUNTER — Other Ambulatory Visit: Payer: Self-pay

## 2023-11-24 ENCOUNTER — Encounter (HOSPITAL_COMMUNITY): Payer: Self-pay | Admitting: Emergency Medicine

## 2023-11-24 ENCOUNTER — Emergency Department (HOSPITAL_COMMUNITY): Admission: EM | Admit: 2023-11-24 | Discharge: 2023-11-24 | Disposition: A | Discharge status: Home / Self Care

## 2023-11-24 VITALS — BP 137/83 | HR 95 | Temp 99.9°F | Resp 18

## 2023-11-24 DIAGNOSIS — D259 Leiomyoma of uterus, unspecified: Secondary | ICD-10-CM | POA: Insufficient documentation

## 2023-11-24 DIAGNOSIS — I10 Essential (primary) hypertension: Secondary | ICD-10-CM | POA: Insufficient documentation

## 2023-11-24 DIAGNOSIS — I2510 Atherosclerotic heart disease of native coronary artery without angina pectoris: Secondary | ICD-10-CM | POA: Diagnosis not present

## 2023-11-24 DIAGNOSIS — R1012 Left upper quadrant pain: Secondary | ICD-10-CM | POA: Diagnosis present

## 2023-11-24 DIAGNOSIS — Z79899 Other long term (current) drug therapy: Secondary | ICD-10-CM | POA: Diagnosis not present

## 2023-11-24 DIAGNOSIS — R609 Edema, unspecified: Secondary | ICD-10-CM

## 2023-11-24 LAB — COMPREHENSIVE METABOLIC PANEL WITH GFR
ALT: 18 U/L (ref 0–44)
AST: 17 U/L (ref 15–41)
Albumin: 2.7 g/dL — ABNORMAL LOW (ref 3.5–5.0)
Alkaline Phosphatase: 52 U/L (ref 38–126)
Anion gap: 13 (ref 5–15)
BUN: 8 mg/dL (ref 6–20)
CO2: 24 mmol/L (ref 22–32)
Calcium: 9.1 mg/dL (ref 8.9–10.3)
Chloride: 100 mmol/L (ref 98–111)
Creatinine, Ser: 0.6 mg/dL (ref 0.44–1.00)
GFR, Estimated: >60 mL/min (ref >60)
Glucose, Bld: 102 mg/dL — ABNORMAL HIGH (ref 70–99)
Potassium: 3.3 mmol/L — ABNORMAL LOW (ref 3.5–5.1)
Sodium: 137 mmol/L (ref 135–145)
Total Bilirubin: 0.4 mg/dL (ref 0.0–1.2)
Total Protein: 8.1 g/dL (ref 6.5–8.1)

## 2023-11-24 LAB — URINALYSIS, ROUTINE W REFLEX MICROSCOPIC
Bacteria, UA: NONE SEEN
Bilirubin Urine: NEGATIVE
Glucose, UA: NEGATIVE mg/dL
Hgb urine dipstick: NEGATIVE
Ketones, ur: NEGATIVE mg/dL
Leukocytes,Ua: NEGATIVE
Nitrite: NEGATIVE
Protein, ur: 30 mg/dL — AB
Specific Gravity, Urine: 1.017 (ref 1.005–1.030)
pH: 6 (ref 5.0–8.0)

## 2023-11-24 LAB — CBC
HCT: 37 % (ref 36.0–46.0)
Hemoglobin: 10.4 g/dL — ABNORMAL LOW (ref 12.0–15.0)
MCH: 25.4 pg — ABNORMAL LOW (ref 26.0–34.0)
MCHC: 28.1 g/dL — ABNORMAL LOW (ref 30.0–36.0)
MCV: 90.5 fL (ref 80.0–100.0)
Platelets: 430 K/uL — ABNORMAL HIGH (ref 150–400)
RBC: 4.09 MIL/uL (ref 3.87–5.11)
RDW: 16.4 % — ABNORMAL HIGH (ref 11.5–15.5)
WBC: 9.5 K/uL (ref 4.0–10.5)
nRBC: 0 % (ref 0.0–0.2)

## 2023-11-24 LAB — LIPASE, BLOOD: Lipase: 29 U/L (ref 11–51)

## 2023-11-24 LAB — PREGNANCY, URINE: Preg Test, Ur: NEGATIVE

## 2023-11-24 MED ORDER — MORPHINE SULFATE (PF) 4 MG/ML IV SOLN
4.0000 mg | Freq: Once | INTRAVENOUS | Status: AC
  Administered 2023-11-24: 4 mg via INTRAVENOUS
  Filled 2023-11-24: qty 1

## 2023-11-24 MED ORDER — POTASSIUM CHLORIDE CRYS ER 20 MEQ PO TBCR
40.0000 meq | EXTENDED_RELEASE_TABLET | Freq: Once | ORAL | Status: AC
  Administered 2023-11-24: 40 meq via ORAL
  Filled 2023-11-24: qty 2

## 2023-11-24 MED ORDER — IBUPROFEN 600 MG PO TABS: 600.0000 mg | ORAL_TABLET | Freq: Four times a day (QID) | ORAL | 0 refills | Status: DC | PRN

## 2023-11-24 MED ORDER — IBUPROFEN 600 MG PO TABS: 600.0000 mg | ORAL_TABLET | Freq: Four times a day (QID) | ORAL | 0 refills | Status: AC | PRN

## 2023-11-24 MED ORDER — IOHEXOL 300 MG/ML  SOLN
100.0000 mL | Freq: Once | INTRAMUSCULAR | Status: AC | PRN
  Administered 2023-11-24: 100 mL via INTRAVENOUS

## 2023-11-24 NOTE — Discharge Instructions (Addendum)
 You have been evaluated for your abdominal pain.  CT scan today shows a significant enlargements of your uterus with a large centrally necrotic fibroid within that has significantly changed from prior exam.  It is important for you to call and follow-up closely with your gynecologist for outpatient management.  You may take ibuprofen  as needed for pain.  Return to ED if any concern.

## 2023-11-24 NOTE — ED Provider Notes (Signed)
 Amanda Amanda EMERGENCY DEPARTMENT AT [facility] Provider Note   CSN: [id number] Arrival date & time: 11/24/23  1348     Patient presents with: Abdominal Pain   Amanda Amanda is a 48 y.o. female.   The history is provided by the patient and medical records. No language interpreter was used.  Abdominal Pain    48 year old female history of uterine fibroid, hypertension, hyperlipidemia, umbilical hernia, obesity, CAD, presenting with abd pain.  Patient states since this morning she has had persistent pain primarily to the left side of the abdomen.  She described pain as a stabbing sensation, worse when she moves or breath heavily.  She wondered if it could be a muscle pull as she has been dealing with an upper respiratory infection currently cough and congestion.  States she has been having cold symptoms for several weeks and has gone through 2 rounds of antibiotics.  She denies any fever or chills no nausea vomiting diarrhea constipation no dysuria no hematuria no blood in the stool no black tarry stool.  Patient states she had to fast this morning to have her blood work done for work and did not have an opportunity to take any medication for the pain.  She denies alcohol or tobacco use.  Prior to Admission medications   Medication Sig Start Date End Date Taking? Authorizing Provider  amLODipine  (NORVASC ) 10 MG tablet TAKE 1 TABLET BY MOUTH EVERY DAY 06/27/23   Jarold Medici, MD  azithromycin  (ZITHROMAX ) 250 MG tablet Take 2 tablets together on day 1, then take 1 tablet daily for 4 days 09/24/23   Rising, Asberry, PA-C  benzonatate  (TESSALON  PERLES) 100 MG capsule Take 1 capsule (100 mg total) by mouth 3 (three) times daily as needed for cough. 10/30/23 10/29/24  Christopher Savannah, PA-C  furosemide  (LASIX ) 40 MG tablet One tab po every day prn 11/16/23   Jarold Medici, MD  meloxicam  (MOBIC ) 15 MG tablet TAKE 1 TABLET (15 MG TOTAL) BY MOUTH DAILY. 09/19/23   Hyatt, Max T, DPM   polyethylene glycol-electrolytes (NULYTELY) 420 g solution Take 4,000 mLs by mouth once. Patient not taking: Reported on 11/16/2023 09/16/23   [provider]  predniSONE  (DELTASONE ) 20 MG tablet Take 2 tablets daily with breakfast. 10/30/23   Christopher Savannah, PA-C  rosuvastatin  (CRESTOR ) 20 MG tablet Take 1 tablet (20 mg total) by mouth daily. 09/20/23 09/19/24  Jarold Medici, MD  Vitamin D , Ergocalciferol , (DRISDOL ) 1.25 MG (50000 UNIT) CAPS capsule Take 1 capsule (50,000 Units total) by mouth every 7 (seven) days. 10/05/21   Jarold Medici, MD    Allergies: Amoxicillin, Doxycycline  hyclate, and Penicillins    Review of Systems  Gastrointestinal:  Positive for abdominal pain.  All other systems reviewed and are negative.   Updated Vital Signs BP (!) 151/102   Pulse 99   Temp 98.4 F (36.9 C) (Oral)   Resp 18   Ht 5' 5 (1.651 m)   Wt 133.6 kg   SpO2 94%   BMI 49.01 kg/m   Physical Exam Vitals and nursing note reviewed.  Constitutional:      General: She is not in acute distress.    Appearance: She is well-developed. She is obese.  HENT:     Head: Atraumatic.  Eyes:     Conjunctiva/sclera: Conjunctivae normal.  Cardiovascular:     Rate and Rhythm: Normal rate and regular rhythm.  Pulmonary:     Effort: Pulmonary effort is normal.     Breath sounds: Normal  breath sounds.  Abdominal:     General: Bowel sounds are normal.     Tenderness: There is generalized abdominal tenderness. There is guarding. There is no rebound.  Musculoskeletal:     Cervical back: Neck supple.  Skin:    Findings: No rash.  Neurological:     Mental Status: She is alert.  Psychiatric:        Mood and Affect: Mood normal.     (all labs ordered are listed, but only abnormal results are displayed) Labs Reviewed  COMPREHENSIVE METABOLIC PANEL WITH GFR - Abnormal; Notable for the following components:      Result Value   Potassium 3.3 (*)    Glucose, Bld 102 (*)    Albumin 2.7 (*)    All  other components within normal limits  CBC - Abnormal; Notable for the following components:   Hemoglobin 10.4 (*)    MCH 25.4 (*)    MCHC 28.1 (*)    RDW 16.4 (*)    Platelets 430 (*)    All other components within normal limits  URINALYSIS, ROUTINE W REFLEX MICROSCOPIC - Abnormal; Notable for the following components:   Protein, ur 30 (*)    All other components within normal limits  LIPASE, BLOOD    EKG: None  Radiology: CT ABDOMEN PELVIS W CONTRAST Result Date: 11/24/2023 CLINICAL DATA:  Left upper quadrant pain EXAM: CT ABDOMEN AND PELVIS WITH CONTRAST TECHNIQUE: Multidetector CT imaging of the abdomen and pelvis was performed using the standard protocol following bolus administration of intravenous contrast. RADIATION DOSE REDUCTION: This exam was performed according to the departmental dose-optimization program which includes automated exposure control, adjustment of the mA and/or kV according to patient size and/or use of iterative reconstruction technique. CONTRAST:  OMNIPAQUE  IOHEXOL  300 MG/ML  SOLN COMPARISON:  12/08/2022 FINDINGS: Lower chest: Right basilar atelectasis is noted. Stable right middle lobe pleural base nodule is seen. Previous exam shows to be stable for more than 2 years and considered benign. No follow-up is recommended. Hepatobiliary: No focal liver abnormality is seen. No gallstones, gallbladder wall thickening, or biliary dilatation. Pancreas: Unremarkable. No pancreatic ductal dilatation or surrounding inflammatory changes. Spleen: Normal in size without focal abnormality. Adrenals/Urinary Tract: Adrenal glands are within normal limits. Kidneys demonstrate a normal enhancement pattern bilaterally. Normal excretion is seen. The bladder is decompressed. Stomach/Bowel: Scattered diverticular changes noted within the colon. No evidence of diverticulitis is seen. The appendix is not discretely visualized although no inflammatory changes are noted to suggest  appendicitis. Stomach and small bowel are within normal limits. Vascular/Lymphatic: No significant vascular findings are present. No enlarged abdominal or pelvic lymph nodes. Reproductive: The uterus is significantly enlarged measuring approximately 28 cm in transverse diameter and 17 cm in AP dimension. This is increased from the prior exam at which time it measured 19 x 13. Rim calcified fibroid is noted on the right measuring up to 10 cm. A large rim calcified hypodense lesion is noted likely representing a necrotic fibroid. An additional rim calcified fibroid is noted within. These changes are significantly different than that seen on the prior exam. Multiple smaller rim calcified fibroids are noted as well. No adnexal mass is seen. Other: No abdominal wall hernia or abnormality. No abdominopelvic ascites. Musculoskeletal: No acute or significant osseous findings. IMPRESSION: Significant enlargement of the uterus with what appears to be a large centrally necrotic fibroid within. This has changed significantly in the interval from the prior exam. This may contribute to the patient's current  symptomatology. Surgical consultation may be warranted. Diverticulosis without diverticulitis. Mild right basilar atelectasis is seen. Electronically Signed   By: Oneil Devonshire M.D.   On: 11/24/2023 20:39     Procedures   Medications Ordered in the ED  potassium chloride  SA (KLOR-CON  M) CR tablet 40 mEq (40 mEq Oral Given 11/24/23 1640)  morphine  (PF) 4 MG/ML injection 4 mg (4 mg Intravenous Given 11/24/23 1742)  iohexol  (OMNIPAQUE ) 300 MG/ML solution 100 mL (100 mLs Intravenous Contrast Given 11/24/23 1927)                                    Medical Decision Making Amount and/or Complexity of Data Reviewed Labs: ordered. Radiology: ordered.  Risk Prescription drug management.   BP (!) 151/102   Pulse 99   Temp 98.4 F (36.9 C) (Oral)   Resp 18   Ht 5' 5 (1.651 m)   Wt 133.6 kg   SpO2 94%   BMI  49.01 kg/m   42:75 PM  48 year old female history of uterine fibroid, hypertension, hyperlipidemia, umbilical hernia, obesity, CAD, presenting with abd pain.  Patient states since this morning she has had persistent pain primarily to the left side of the abdomen.  She described pain as a stabbing sensation, worse when she moves or breath heavily.  She wondered if it could be a muscle pull as she has been dealing with an upper respiratory infection currently cough and congestion.  States she has been having cold symptoms for several weeks and has gone through 2 rounds of antibiotics.  She denies any fever or chills no nausea vomiting diarrhea constipation no dysuria no hematuria no blood in the stool no black tarry stool.  Patient states she had to fast this morning to have her blood work done for work and did not have an opportunity to take any medication for the pain.  She denies alcohol or tobacco use.  Exam notable for diffuse abdominal tenderness to palpation without focal point tenderness.  Bowel sounds present.  She does exhibit some guarding while palpating.  Will obtain imaging for further assessment.  -Labs ordered, independently viewed and interpreted by me.  Labs remarkable for K+ 3.3.  recommend supplementation at home.  Hgb 10.4, it was 12.4 two months ago.  Considered hemoccult check -The patient was maintained on a cardiac monitor.  I personally viewed and interpreted the cardiac monitored which showed an underlying rhythm of: NSR -Imaging independently viewed and interpreted by me and I agree with radiologist's interpretation.  Result remarkable for abd/pelvis CT showing significantly enlarged uterus, changes from prior.  This is likely contributing to pt's complaint -This patient presents to the ED for concern of abd pain, this involves an extensive number of treatment options, and is a complaint that carries with it a high risk of complications and morbidity.  The differential diagnosis  includes malignancy, leomyoma, colitis, diverticulitis, gastritis, pancreatitis, cholecystitis, appendicitis -Co morbidities that complicate the patient evaluation includes uterine fibroid -Treatment includes morphine , potassiium -Reevaluation of the patient after these medicines showed that the patient improved -PCP office notes or outside notes reviewed -Discussion with specialist OBGYN Dr. Eveline who recommend close follow up with GYN Dr. Darcel as pt was seen by her 3 days ago  -Escalation to admission/observation considered: patients feels much better, is comfortable with discharge, and will follow up with GYN -Prescription medication considered, patient comfortable with ibuprofen  for pain -Social Determinant  of Health considered which includes lack of mobility      Final diagnoses:  Uterine leiomyoma, unspecified location    ED Discharge Orders          Ordered    ibuprofen  (ADVIL ) 600 MG tablet  Every 6 hours PRN        11/24/23 2132               Nivia Colon, PA-C 11/24/23 2136    Kammerer, Megan L, DO 11/25/23 1537

## 2023-11-24 NOTE — Progress Notes (Signed)
 Patient was sent to the ED for abdominal pain.

## 2023-11-24 NOTE — ED Triage Notes (Signed)
 Patient c/o LUQ abdominal pain x 1 day. Patient report nausea, denies vomiting and diarrhea. Patient denies dysuria. Patient denies fever at home.

## 2023-11-29 ENCOUNTER — Other Ambulatory Visit

## 2023-11-30 ENCOUNTER — Other Ambulatory Visit

## 2023-12-01 ENCOUNTER — Ambulatory Visit: Admitting: Podiatry

## 2023-12-01 ENCOUNTER — Encounter: Payer: Self-pay | Admitting: Obstetrics and Gynecology

## 2023-12-04 ENCOUNTER — Encounter (HOSPITAL_COMMUNITY): Payer: Self-pay

## 2023-12-04 ENCOUNTER — Encounter (HOSPITAL_COMMUNITY)
Admission: EM | Disposition: A | Discharge status: Home Health | Payer: Self-pay | Source: Home / Self Care | Attending: Obstetrics & Gynecology

## 2023-12-04 ENCOUNTER — Inpatient Hospital Stay (HOSPITAL_COMMUNITY): Admitting: Anesthesiology

## 2023-12-04 ENCOUNTER — Emergency Department (HOSPITAL_COMMUNITY)

## 2023-12-04 ENCOUNTER — Other Ambulatory Visit: Payer: Self-pay

## 2023-12-04 ENCOUNTER — Inpatient Hospital Stay (HOSPITAL_COMMUNITY)
Admission: EM | Admit: 2023-12-04 | Discharge: 2023-12-17 | DRG: 981 | Disposition: A | Discharge status: Home Health | Attending: Obstetrics & Gynecology | Admitting: Obstetrics & Gynecology

## 2023-12-04 DIAGNOSIS — J95821 Acute postprocedural respiratory failure: Secondary | ICD-10-CM | POA: Diagnosis not present

## 2023-12-04 DIAGNOSIS — K760 Fatty (change of) liver, not elsewhere classified: Secondary | ICD-10-CM | POA: Diagnosis present

## 2023-12-04 DIAGNOSIS — D72829 Elevated white blood cell count, unspecified: Secondary | ICD-10-CM | POA: Diagnosis not present

## 2023-12-04 DIAGNOSIS — D62 Acute posthemorrhagic anemia: Secondary | ICD-10-CM | POA: Diagnosis not present

## 2023-12-04 DIAGNOSIS — R000 Tachycardia, unspecified: Secondary | ICD-10-CM | POA: Diagnosis not present

## 2023-12-04 DIAGNOSIS — M1990 Unspecified osteoarthritis, unspecified site: Secondary | ICD-10-CM | POA: Diagnosis present

## 2023-12-04 DIAGNOSIS — E1169 Type 2 diabetes mellitus with other specified complication: Secondary | ICD-10-CM | POA: Diagnosis not present

## 2023-12-04 DIAGNOSIS — I50810 Right heart failure, unspecified: Secondary | ICD-10-CM | POA: Diagnosis present

## 2023-12-04 DIAGNOSIS — I2510 Atherosclerotic heart disease of native coronary artery without angina pectoris: Secondary | ICD-10-CM | POA: Diagnosis not present

## 2023-12-04 DIAGNOSIS — E871 Hypo-osmolality and hyponatremia: Secondary | ICD-10-CM | POA: Diagnosis not present

## 2023-12-04 DIAGNOSIS — I110 Hypertensive heart disease with heart failure: Secondary | ICD-10-CM | POA: Diagnosis present

## 2023-12-04 DIAGNOSIS — R609 Edema, unspecified: Secondary | ICD-10-CM | POA: Diagnosis not present

## 2023-12-04 DIAGNOSIS — I509 Heart failure, unspecified: Secondary | ICD-10-CM | POA: Diagnosis not present

## 2023-12-04 DIAGNOSIS — Z880 Allergy status to penicillin: Secondary | ICD-10-CM

## 2023-12-04 DIAGNOSIS — J189 Pneumonia, unspecified organism: Secondary | ICD-10-CM | POA: Diagnosis not present

## 2023-12-04 DIAGNOSIS — Y95 Nosocomial condition: Secondary | ICD-10-CM | POA: Diagnosis not present

## 2023-12-04 DIAGNOSIS — R1030 Lower abdominal pain, unspecified: Secondary | ICD-10-CM | POA: Diagnosis not present

## 2023-12-04 DIAGNOSIS — Z7401 Bed confinement status: Secondary | ICD-10-CM | POA: Diagnosis not present

## 2023-12-04 DIAGNOSIS — N858 Other specified noninflammatory disorders of uterus: Secondary | ICD-10-CM | POA: Diagnosis present

## 2023-12-04 DIAGNOSIS — E119 Type 2 diabetes mellitus without complications: Secondary | ICD-10-CM | POA: Diagnosis present

## 2023-12-04 DIAGNOSIS — Z79899 Other long term (current) drug therapy: Secondary | ICD-10-CM | POA: Diagnosis not present

## 2023-12-04 DIAGNOSIS — K651 Peritoneal abscess: Secondary | ICD-10-CM | POA: Diagnosis not present

## 2023-12-04 DIAGNOSIS — E66813 Obesity, class 3: Secondary | ICD-10-CM | POA: Diagnosis present

## 2023-12-04 DIAGNOSIS — J690 Pneumonitis due to inhalation of food and vomit: Secondary | ICD-10-CM | POA: Diagnosis not present

## 2023-12-04 DIAGNOSIS — A419 Sepsis, unspecified organism: Secondary | ICD-10-CM | POA: Diagnosis not present

## 2023-12-04 DIAGNOSIS — E7800 Pure hypercholesterolemia, unspecified: Secondary | ICD-10-CM | POA: Diagnosis present

## 2023-12-04 DIAGNOSIS — Z791 Long term (current) use of non-steroidal anti-inflammatories (NSAID): Secondary | ICD-10-CM

## 2023-12-04 DIAGNOSIS — D219 Benign neoplasm of connective and other soft tissue, unspecified: Secondary | ICD-10-CM | POA: Diagnosis present

## 2023-12-04 DIAGNOSIS — I5033 Acute on chronic diastolic (congestive) heart failure: Secondary | ICD-10-CM | POA: Insufficient documentation

## 2023-12-04 DIAGNOSIS — T501X5A Adverse effect of loop [high-ceiling] diuretics, initial encounter: Secondary | ICD-10-CM | POA: Diagnosis not present

## 2023-12-04 DIAGNOSIS — D259 Leiomyoma of uterus, unspecified: Principal | ICD-10-CM | POA: Diagnosis present

## 2023-12-04 DIAGNOSIS — Z8249 Family history of ischemic heart disease and other diseases of the circulatory system: Secondary | ICD-10-CM

## 2023-12-04 DIAGNOSIS — Z881 Allergy status to other antibiotic agents status: Secondary | ICD-10-CM

## 2023-12-04 DIAGNOSIS — Z6841 Body Mass Index (BMI) 40.0 and over, adult: Secondary | ICD-10-CM | POA: Diagnosis not present

## 2023-12-04 DIAGNOSIS — T8161XA Aseptic peritonitis due to foreign substance accidentally left during a procedure, initial encounter: Secondary | ICD-10-CM

## 2023-12-04 DIAGNOSIS — E876 Hypokalemia: Secondary | ICD-10-CM | POA: Diagnosis not present

## 2023-12-04 DIAGNOSIS — J90 Pleural effusion, not elsewhere classified: Secondary | ICD-10-CM | POA: Diagnosis not present

## 2023-12-04 DIAGNOSIS — S3769XA Other injury of uterus, initial encounter: Secondary | ICD-10-CM | POA: Diagnosis not present

## 2023-12-04 DIAGNOSIS — R188 Other ascites: Secondary | ICD-10-CM | POA: Diagnosis present

## 2023-12-04 DIAGNOSIS — E8809 Other disorders of plasma-protein metabolism, not elsewhere classified: Secondary | ICD-10-CM | POA: Diagnosis present

## 2023-12-04 DIAGNOSIS — I10 Essential (primary) hypertension: Secondary | ICD-10-CM | POA: Diagnosis not present

## 2023-12-04 DIAGNOSIS — J9601 Acute respiratory failure with hypoxia: Secondary | ICD-10-CM

## 2023-12-04 DIAGNOSIS — R0689 Other abnormalities of breathing: Secondary | ICD-10-CM | POA: Diagnosis not present

## 2023-12-04 DIAGNOSIS — K659 Peritonitis, unspecified: Secondary | ICD-10-CM | POA: Diagnosis not present

## 2023-12-04 DIAGNOSIS — I96 Gangrene, not elsewhere classified: Principal | ICD-10-CM | POA: Diagnosis present

## 2023-12-04 HISTORY — PX: HYSTERECTOMY ABDOMINAL WITH SALPINGO-OOPHORECTOMY: SHX6792

## 2023-12-04 LAB — COMPREHENSIVE METABOLIC PANEL WITH GFR
ALT: 16 U/L (ref 0–44)
AST: 21 U/L (ref 15–41)
Albumin: 2.5 g/dL — ABNORMAL LOW (ref 3.5–5.0)
Alkaline Phosphatase: 62 U/L (ref 38–126)
Anion gap: 12 (ref 5–15)
BUN: 13 mg/dL (ref 6–20)
CO2: 26 mmol/L (ref 22–32)
Calcium: 8.9 mg/dL (ref 8.9–10.3)
Chloride: 101 mmol/L (ref 98–111)
Creatinine, Ser: 0.7 mg/dL (ref 0.44–1.00)
GFR, Estimated: >60 mL/min (ref >60)
Glucose, Bld: 169 mg/dL — ABNORMAL HIGH (ref 70–99)
Potassium: 3.6 mmol/L (ref 3.5–5.1)
Sodium: 139 mmol/L (ref 135–145)
Total Bilirubin: 0.7 mg/dL (ref 0.0–1.2)
Total Protein: 8.7 g/dL — ABNORMAL HIGH (ref 6.5–8.1)

## 2023-12-04 LAB — CBC WITH DIFFERENTIAL/PLATELET
Abs Granulocyte: 7.2 K/uL — ABNORMAL HIGH (ref 1.5–6.5)
Abs Immature Granulocytes: 0.03 K/uL (ref 0.00–0.07)
Basophils Absolute: 0 K/uL (ref 0.0–0.1)
Basophils Relative: 0 %
Eosinophils Absolute: 0 K/uL (ref 0.0–0.5)
Eosinophils Relative: 0 %
HCT: 35.9 % — ABNORMAL LOW (ref 36.0–46.0)
Hemoglobin: 10.5 g/dL — ABNORMAL LOW (ref 12.0–15.0)
Immature Granulocytes: 0 %
Lymphocytes Relative: 8 %
Lymphs Abs: 0.7 K/uL (ref 0.7–4.0)
MCH: 25.1 pg — ABNORMAL LOW (ref 26.0–34.0)
MCHC: 29.2 g/dL — ABNORMAL LOW (ref 30.0–36.0)
MCV: 85.9 fL (ref 80.0–100.0)
Monocytes Absolute: 0.5 K/uL (ref 0.1–1.0)
Monocytes Relative: 5 %
Neutro Abs: 7.2 K/uL (ref 1.7–7.7)
Neutrophils Relative %: 87 %
Platelets: 467 K/uL — ABNORMAL HIGH (ref 150–400)
RBC: 4.18 MIL/uL (ref 3.87–5.11)
RDW: 16.5 % — ABNORMAL HIGH (ref 11.5–15.5)
WBC: 8.4 K/uL (ref 4.0–10.5)
nRBC: 0 % (ref 0.0–0.2)

## 2023-12-04 LAB — CBC
HCT: 39 % (ref 36.0–46.0)
Hemoglobin: 11.8 g/dL — ABNORMAL LOW (ref 12.0–15.0)
MCH: 25.4 pg — ABNORMAL LOW (ref 26.0–34.0)
MCHC: 30.3 g/dL (ref 30.0–36.0)
MCV: 84.1 fL (ref 80.0–100.0)
Platelets: 472 K/uL — ABNORMAL HIGH (ref 150–400)
RBC: 4.64 MIL/uL (ref 3.87–5.11)
RDW: 16.5 % — ABNORMAL HIGH (ref 11.5–15.5)
WBC: 3.9 K/uL — ABNORMAL LOW (ref 4.0–10.5)
nRBC: 0 % (ref 0.0–0.2)

## 2023-12-04 LAB — ABO/RH: ABO/RH(D): B POS

## 2023-12-04 LAB — DIC (DISSEMINATED INTRAVASCULAR COAGULATION)PANEL
D-Dimer, Quant: 4.63 ug{FEU}/mL — ABNORMAL HIGH (ref 0.00–0.50)
Fibrinogen: 793 mg/dL — ABNORMAL HIGH (ref 210–475)
INR: 1.2 (ref 0.8–1.2)
Platelets: 479 K/uL — ABNORMAL HIGH (ref 150–400)
Prothrombin Time: 16.2 s — ABNORMAL HIGH (ref 11.4–15.2)
Smear Review: NONE SEEN
aPTT: 31 s (ref 24–36)

## 2023-12-04 LAB — PREPARE RBC (CROSSMATCH)

## 2023-12-04 LAB — LACTIC ACID, PLASMA: Lactic Acid, Venous: 1 mmol/L (ref 0.5–1.9)

## 2023-12-04 LAB — LIPASE, BLOOD: Lipase: 31 U/L (ref 11–51)

## 2023-12-04 LAB — HCG, SERUM, QUALITATIVE: Preg, Serum: NEGATIVE

## 2023-12-04 MED ORDER — CEFAZOLIN SODIUM-DEXTROSE 2-3 GM-%(50ML) IV SOLR
INTRAVENOUS | Status: DC | PRN
  Administered 2023-12-04: 2 g via INTRAVENOUS

## 2023-12-04 MED ORDER — EPHEDRINE 5 MG/ML INJ
INTRAVENOUS | Status: AC
  Filled 2023-12-04: qty 5

## 2023-12-04 MED ORDER — ACETAMINOPHEN 325 MG PO TABS
650.0000 mg | ORAL_TABLET | Freq: Four times a day (QID) | ORAL | Status: DC | PRN
  Administered 2023-12-04 – 2023-12-15 (×10): 650 mg via ORAL
  Filled 2023-12-04 (×11): qty 2

## 2023-12-04 MED ORDER — FAMOTIDINE IN NACL 20-0.9 MG/50ML-% IV SOLN
20.0000 mg | Freq: Two times a day (BID) | INTRAVENOUS | Status: DC
  Administered 2023-12-04 (×2): 20 mg via INTRAVENOUS
  Filled 2023-12-04 (×3): qty 50

## 2023-12-04 MED ORDER — GENTAMICIN SULFATE 40 MG/ML IJ SOLN
670.0000 mg | Freq: Once | INTRAVENOUS | Status: DC
  Administered 2023-12-04: 440 mg via INTRAVENOUS

## 2023-12-04 MED ORDER — HYDROMORPHONE HCL 1 MG/ML IJ SOLN
0.2000 mg | INTRAMUSCULAR | Status: DC | PRN
  Administered 2023-12-04: 0.5 mg via INTRAVENOUS
  Filled 2023-12-04: qty 1

## 2023-12-04 MED ORDER — CHLORHEXIDINE GLUCONATE 0.12 % MT SOLN
15.0000 mL | Freq: Once | OROMUCOSAL | Status: AC
  Administered 2023-12-04: 15 mL via OROMUCOSAL

## 2023-12-04 MED ORDER — MIDAZOLAM HCL 2 MG/2ML IJ SOLN
INTRAMUSCULAR | Status: AC
  Filled 2023-12-04: qty 2

## 2023-12-04 MED ORDER — CEFAZOLIN SODIUM-DEXTROSE 2-4 GM/100ML-% IV SOLN
INTRAVENOUS | Status: AC
  Filled 2023-12-04: qty 100

## 2023-12-04 MED ORDER — HYDROMORPHONE HCL 1 MG/ML IJ SOLN
0.5000 mg | Freq: Once | INTRAMUSCULAR | Status: AC
  Administered 2023-12-04: 0.5 mg via INTRAVENOUS
  Filled 2023-12-04: qty 1

## 2023-12-04 MED ORDER — HYDROCORTISONE SOD SUC (PF) 100 MG IJ SOLR
INTRAMUSCULAR | Status: DC | PRN
  Administered 2023-12-04: 100 mg via INTRAVENOUS

## 2023-12-04 MED ORDER — LIDOCAINE 2% (20 MG/ML) 5 ML SYRINGE
INTRAMUSCULAR | Status: DC | PRN
  Administered 2023-12-04: 100 mg via INTRAVENOUS

## 2023-12-04 MED ORDER — SOD CITRATE-CITRIC ACID 500-334 MG/5ML PO SOLN
ORAL | Status: AC
  Administered 2023-12-04: 30 mL via ORAL
  Filled 2023-12-04: qty 30

## 2023-12-04 MED ORDER — POVIDONE-IODINE 10 % EX SWAB: 2.0000 | Freq: Once | CUTANEOUS | Status: DC

## 2023-12-04 MED ORDER — SODIUM CHLORIDE 0.9 % IV SOLN: 250.0000 mL | INTRAVENOUS | Status: DC | PRN

## 2023-12-04 MED ORDER — CHEWING GUM (ORBIT) SUGAR FREE
1.0000 | CHEWING_GUM | Freq: Four times a day (QID) | ORAL | Status: DC
  Administered 2023-12-05 – 2023-12-17 (×45): 1 via ORAL
  Filled 2023-12-04 (×2): qty 1

## 2023-12-04 MED ORDER — PROPOFOL 500 MG/50ML IV EMUL
INTRAVENOUS | Status: DC | PRN
  Administered 2023-12-04: 50 ug/kg/min via INTRAVENOUS

## 2023-12-04 MED ORDER — SIMETHICONE 80 MG PO CHEW: 80.0000 mg | CHEWABLE_TABLET | Freq: Four times a day (QID) | ORAL | Status: DC | PRN

## 2023-12-04 MED ORDER — KETAMINE HCL 50 MG/5ML IJ SOSY
PREFILLED_SYRINGE | INTRAMUSCULAR | Status: AC
  Filled 2023-12-04: qty 10

## 2023-12-04 MED ORDER — TRANEXAMIC ACID-NACL 1000-0.7 MG/100ML-% IV SOLN
INTRAVENOUS | Status: AC
  Filled 2023-12-04: qty 100

## 2023-12-04 MED ORDER — LIDOCAINE 2% (20 MG/ML) 5 ML SYRINGE
INTRAMUSCULAR | Status: AC
  Filled 2023-12-04: qty 5

## 2023-12-04 MED ORDER — FENTANYL CITRATE (PF) 250 MCG/5ML IJ SOLN
INTRAMUSCULAR | Status: DC | PRN
  Administered 2023-12-04 (×2): 25 ug via INTRAVENOUS
  Administered 2023-12-04 (×3): 50 ug via INTRAVENOUS

## 2023-12-04 MED ORDER — SUCCINYLCHOLINE CHLORIDE 200 MG/10ML IV SOSY
PREFILLED_SYRINGE | INTRAVENOUS | Status: DC | PRN
  Administered 2023-12-04: 140 mg via INTRAVENOUS

## 2023-12-04 MED ORDER — PHENYLEPHRINE 80 MCG/ML (10ML) SYRINGE FOR IV PUSH (FOR BLOOD PRESSURE SUPPORT)
PREFILLED_SYRINGE | INTRAVENOUS | Status: DC | PRN
  Administered 2023-12-04: 160 ug via INTRAVENOUS

## 2023-12-04 MED ORDER — LACTATED RINGERS IV SOLN
INTRAVENOUS | Status: DC | PRN
Start: 2023-12-04 — End: 2023-12-04

## 2023-12-04 MED ORDER — SODIUM CHLORIDE 0.9% IV SOLUTION: Freq: Once | INTRAVENOUS | Status: DC

## 2023-12-04 MED ORDER — FENTANYL CITRATE (PF) 250 MCG/5ML IJ SOLN
INTRAMUSCULAR | Status: AC
  Filled 2023-12-04: qty 5

## 2023-12-04 MED ORDER — ONDANSETRON HCL 4 MG/2ML IJ SOLN
INTRAMUSCULAR | Status: DC | PRN
  Administered 2023-12-04: 4 mg via INTRAVENOUS

## 2023-12-04 MED ORDER — PROPOFOL 10 MG/ML IV BOLUS
INTRAVENOUS | Status: AC
  Filled 2023-12-04: qty 20

## 2023-12-04 MED ORDER — SODIUM CHLORIDE 0.9% FLUSH
3.0000 mL | Freq: Two times a day (BID) | INTRAVENOUS | Status: DC
  Administered 2023-12-04: 3 mL via INTRAVENOUS

## 2023-12-04 MED ORDER — ONDANSETRON HCL 4 MG PO TABS: 4.0000 mg | ORAL_TABLET | Freq: Four times a day (QID) | ORAL | Status: DC | PRN

## 2023-12-04 MED ORDER — PROPOFOL 1000 MG/100ML IV EMUL
INTRAVENOUS | Status: AC
  Filled 2023-12-04: qty 100

## 2023-12-04 MED ORDER — ONDANSETRON HCL 4 MG/2ML IJ SOLN
4.0000 mg | Freq: Once | INTRAMUSCULAR | Status: AC
  Administered 2023-12-04: 4 mg via INTRAVENOUS
  Filled 2023-12-04: qty 2

## 2023-12-04 MED ORDER — ALBUMIN HUMAN 5 % IV SOLN
INTRAVENOUS | Status: DC | PRN
Start: 2023-12-04 — End: 2023-12-04

## 2023-12-04 MED ORDER — OXYCODONE HCL 5 MG PO TABS
5.0000 mg | ORAL_TABLET | ORAL | Status: DC | PRN
  Administered 2023-12-05: 5 mg via ORAL
  Administered 2023-12-05 – 2023-12-06 (×2): 10 mg via ORAL
  Administered 2023-12-06: 5 mg via ORAL
  Administered 2023-12-06 – 2023-12-16 (×8): 10 mg via ORAL
  Filled 2023-12-04 (×2): qty 2
  Filled 2023-12-04 (×2): qty 1
  Filled 2023-12-04 (×8): qty 2

## 2023-12-04 MED ORDER — PRENATAL MULTIVITAMIN CH
1.0000 | ORAL_TABLET | Freq: Every day | ORAL | Status: DC
  Administered 2023-12-05 – 2023-12-17 (×13): 1 via ORAL
  Filled 2023-12-04 (×13): qty 1

## 2023-12-04 MED ORDER — ROCURONIUM BROMIDE 10 MG/ML (PF) SYRINGE
PREFILLED_SYRINGE | INTRAVENOUS | Status: DC | PRN
  Administered 2023-12-04: 80 mg via INTRAVENOUS

## 2023-12-04 MED ORDER — CHLORHEXIDINE GLUCONATE 0.12 % MT SOLN
OROMUCOSAL | Status: AC
Start: 2023-12-04 — End: 2023-12-04
  Filled 2023-12-04: qty 15

## 2023-12-04 MED ORDER — KETOROLAC TROMETHAMINE 30 MG/ML IJ SOLN
30.0000 mg | Freq: Four times a day (QID) | INTRAMUSCULAR | Status: DC | PRN
  Administered 2023-12-04 – 2023-12-05 (×2): 30 mg via INTRAVENOUS
  Filled 2023-12-04 (×2): qty 1

## 2023-12-04 MED ORDER — SOD CITRATE-CITRIC ACID 500-334 MG/5ML PO SOLN: 30.0000 mL | ORAL | Status: AC

## 2023-12-04 MED ORDER — SUCCINYLCHOLINE CHLORIDE 200 MG/10ML IV SOSY
PREFILLED_SYRINGE | INTRAVENOUS | Status: AC
Start: 2023-12-04 — End: 2023-12-04
  Filled 2023-12-04: qty 10

## 2023-12-04 MED ORDER — SODIUM CHLORIDE 0.9% FLUSH: 3.0000 mL | INTRAVENOUS | Status: DC | PRN

## 2023-12-04 MED ORDER — ONDANSETRON HCL 4 MG/2ML IJ SOLN: 4.0000 mg | Freq: Four times a day (QID) | INTRAMUSCULAR | Status: DC | PRN

## 2023-12-04 MED ORDER — AMISULPRIDE (ANTIEMETIC) 5 MG/2ML IV SOLN: 10.0000 mg | Freq: Once | INTRAVENOUS | Status: DC | PRN

## 2023-12-04 MED ORDER — POLYETHYLENE GLYCOL 3350 17 G PO PACK: 17.0000 g | PACK | Freq: Every day | ORAL | Status: DC | PRN

## 2023-12-04 MED ORDER — NOREPINEPHRINE 4 MG/250ML-% IV SOLN
INTRAVENOUS | Status: AC
  Filled 2023-12-04: qty 250

## 2023-12-04 MED ORDER — HYDROMORPHONE HCL 1 MG/ML IJ SOLN
1.0000 mg | Freq: Once | INTRAMUSCULAR | Status: AC
  Administered 2023-12-04: 1 mg via INTRAVENOUS
  Filled 2023-12-04: qty 1

## 2023-12-04 MED ORDER — CEFAZOLIN SODIUM-DEXTROSE 2-4 GM/100ML-% IV SOLN: 2.0000 g | INTRAVENOUS | Status: DC

## 2023-12-04 MED ORDER — HYDROCORTISONE SOD SUC (PF) 250 MG IJ SOLR
INTRAMUSCULAR | Status: AC
  Filled 2023-12-04: qty 250

## 2023-12-04 MED ORDER — IOHEXOL 300 MG/ML  SOLN
100.0000 mL | Freq: Once | INTRAMUSCULAR | Status: AC | PRN
Start: 2023-12-04 — End: 2023-12-04
  Administered 2023-12-04: 100 mL via INTRAVENOUS

## 2023-12-04 MED ORDER — ONDANSETRON HCL 4 MG/2ML IJ SOLN: 4.0000 mg | Freq: Once | INTRAMUSCULAR | Status: DC | PRN

## 2023-12-04 MED ORDER — DEXMEDETOMIDINE HCL IN NACL 80 MCG/20ML IV SOLN
INTRAVENOUS | Status: DC | PRN
  Administered 2023-12-04: 12 ug via INTRAVENOUS

## 2023-12-04 MED ORDER — GENTAMICIN SULFATE 40 MG/ML IJ SOLN
5.0000 mg/kg | Freq: Once | INTRAVENOUS | Status: DC
  Filled 2023-12-04: qty 11

## 2023-12-04 MED ORDER — FENTANYL CITRATE (PF) 100 MCG/2ML IJ SOLN: 25.0000 ug | INTRAMUSCULAR | Status: DC | PRN

## 2023-12-04 MED ORDER — PHENYLEPHRINE HCL-NACL 20-0.9 MG/250ML-% IV SOLN
INTRAVENOUS | Status: DC | PRN
  Administered 2023-12-04: 25 ug/min via INTRAVENOUS

## 2023-12-04 MED ORDER — ORAL CARE MOUTH RINSE: 15.0000 mL | Freq: Once | OROMUCOSAL | Status: AC

## 2023-12-04 MED ORDER — ONDANSETRON HCL 4 MG/2ML IJ SOLN
INTRAMUSCULAR | Status: AC
  Filled 2023-12-04: qty 2

## 2023-12-04 MED ORDER — DIPHENHYDRAMINE HCL 50 MG/ML IJ SOLN: 25.0000 mg | Freq: Four times a day (QID) | INTRAMUSCULAR | Status: DC | PRN

## 2023-12-04 MED ORDER — PROPOFOL 10 MG/ML IV BOLUS
INTRAVENOUS | Status: DC | PRN
  Administered 2023-12-04: 200 mg via INTRAVENOUS

## 2023-12-04 MED ORDER — MIDAZOLAM HCL 2 MG/2ML IJ SOLN
INTRAMUSCULAR | Status: DC | PRN
  Administered 2023-12-04 (×2): 1 mg via INTRAVENOUS

## 2023-12-04 MED ORDER — SODIUM CHLORIDE 0.9 % IV SOLN: INTRAVENOUS | Status: DC

## 2023-12-04 MED ORDER — HYDROCORTISONE SOD SUC (PF) 250 MG IJ SOLR
INTRAMUSCULAR | Status: AC
Start: 2023-12-04 — End: 2023-12-04
  Filled 2023-12-04: qty 250

## 2023-12-04 MED ORDER — CLINDAMYCIN PHOSPHATE 900 MG/50ML IV SOLN
900.0000 mg | Freq: Once | INTRAVENOUS | Status: AC
  Administered 2023-12-04: 900 mg via INTRAVENOUS
  Filled 2023-12-04: qty 50

## 2023-12-04 MED ORDER — ROCURONIUM BROMIDE 10 MG/ML (PF) SYRINGE
PREFILLED_SYRINGE | INTRAVENOUS | Status: AC
Start: 2023-12-04 — End: 2023-12-04
  Filled 2023-12-04: qty 10

## 2023-12-04 MED ORDER — IBUPROFEN 800 MG PO TABS
800.0000 mg | ORAL_TABLET | Freq: Three times a day (TID) | ORAL | Status: DC | PRN
Start: 2023-12-04 — End: 2023-12-04

## 2023-12-04 MED ORDER — PROPOFOL 1000 MG/100ML IV EMUL
INTRAVENOUS | Status: AC
  Filled 2023-12-04: qty 300

## 2023-12-04 MED ORDER — CIPROFLOXACIN IN D5W 400 MG/200ML IV SOLN
400.0000 mg | Freq: Once | INTRAVENOUS | Status: AC
  Administered 2023-12-04: 400 mg via INTRAVENOUS
  Filled 2023-12-04: qty 200

## 2023-12-04 MED ORDER — LACTATED RINGERS IV SOLN: INTRAVENOUS | Status: DC

## 2023-12-04 MED ORDER — OXYCODONE-ACETAMINOPHEN 5-325 MG PO TABS: 1.0000 | ORAL_TABLET | ORAL | Status: DC | PRN

## 2023-12-04 MED ORDER — KETAMINE HCL 10 MG/ML IJ SOLN
INTRAMUSCULAR | Status: DC | PRN
  Administered 2023-12-04: 50 mg via INTRAVENOUS

## 2023-12-04 MED ORDER — SUGAMMADEX SODIUM 200 MG/2ML IV SOLN
INTRAVENOUS | Status: DC | PRN
  Administered 2023-12-04: 400 mg via INTRAVENOUS

## 2023-12-04 MED ORDER — 0.9 % SODIUM CHLORIDE (POUR BTL) OPTIME
TOPICAL | Status: DC | PRN
  Administered 2023-12-04: 1000 mL
  Administered 2023-12-04: 2000 mL
  Administered 2023-12-04 (×2): 1000 mL
  Administered 2023-12-04: 2000 mL

## 2023-12-04 SURGICAL SUPPLY — 31 items
BAG COUNTER SPONGE SURGICOUNT (BAG) ×1 IMPLANT
CHLORAPREP W/TINT 26 (MISCELLANEOUS) IMPLANT
DRAIN 10X20 3/4 PERF LF SIL ST (DRAIN) IMPLANT
DRAPE LAPAROTOMY T 102X78X121 (DRAPES) IMPLANT
DRAPE WARM FLUID 44X44 (DRAPES) ×1 IMPLANT
DRSG OPSITE POSTOP 4X10 (GAUZE/BANDAGES/DRESSINGS) IMPLANT
DRSG TELFA 3X8 NADH STRL (GAUZE/BANDAGES/DRESSINGS) IMPLANT
EVACUATOR SILICONE 100CC (DRAIN) IMPLANT
GAUZE SPONGE 4X4 12PLY STRL (GAUZE/BANDAGES/DRESSINGS) IMPLANT
GLOVE BIO SURGEON STRL SZ 6 (GLOVE) IMPLANT
GLOVE BIOGEL PI IND STRL 8 (GLOVE) ×1 IMPLANT
GLOVE ECLIPSE 8.0 STRL XLNG CF (GLOVE) ×1 IMPLANT
GOWN STRL REUS W/ TWL LRG LVL3 (GOWN DISPOSABLE) ×2 IMPLANT
GOWN STRL REUS W/ TWL XL LVL3 (GOWN DISPOSABLE) ×1 IMPLANT
HANDLE SUCTION POOLE (INSTRUMENTS) IMPLANT
KIT TURNOVER KIT B (KITS) ×1 IMPLANT
NS IRRIG 1000ML POUR BTL (IV SOLUTION) ×2 IMPLANT
PACK ABDOMINAL GYN (CUSTOM PROCEDURE TRAY) ×1 IMPLANT
RETRACTOR WND ALEXIS 25 LRG (MISCELLANEOUS) IMPLANT
RTRCTR C-SECT PINK 25CM LRG (MISCELLANEOUS) IMPLANT
SUT MON AB 3-0 SH27 (SUTURE) IMPLANT
SUT MON AB 4-0 PS1 27 (SUTURE) IMPLANT
SUT VIC AB 0 CT1 27XCR 8 STRN (SUTURE) ×2 IMPLANT
SUT VIC AB 0 CTX36XBRD ANBCTRL (SUTURE) ×1 IMPLANT
SUT VIC AB 2-0 CT1 (SUTURE) IMPLANT
SWAB COLLECTION DEVICE MRSA (MISCELLANEOUS) IMPLANT
SWAB CULTURE ESWAB REG 1ML (MISCELLANEOUS) IMPLANT
SYR 20ML LL LF (SYRINGE) ×1 IMPLANT
TOWEL GREEN STERILE (TOWEL DISPOSABLE) IMPLANT
TOWEL ~~LOC~~+RFID 17X26 BLUE (SPONGE) IMPLANT
TRAY FOLEY W/BAG SLVR 14FR (SET/KITS/TRAYS/PACK) IMPLANT

## 2023-12-04 NOTE — ED Notes (Signed)
 US  PIV placed.

## 2023-12-04 NOTE — Anesthesia Postprocedure Evaluation (Signed)
 Anesthesia Post Note  Patient: Amanda Paul  Procedure(s) Performed: HYSTERECTOMY, ABDOMINAL, WITH SALPINGO-OOPHORECTOMY     Patient location during evaluation: PACU Anesthesia Type: General Level of consciousness: awake and alert Pain management: pain level controlled Vital Signs Assessment: post-procedure vital signs reviewed and stable Respiratory status: spontaneous breathing, nonlabored ventilation, respiratory function stable and patient connected to nasal cannula oxygen Cardiovascular status: blood pressure returned to baseline and stable Postop Assessment: no apparent nausea or vomiting Anesthetic complications: no   No notable events documented.  Last Vitals:  Vitals:   12/04/23 1845 12/04/23 1900  BP: 109/77 112/78  Pulse: 89 85  Resp: (!) 39 (!) 31  Temp:    SpO2: 92% 92%    Last Pain:  Vitals:   12/04/23 1900  TempSrc:   PainSc: 0-No pain                 [name]

## 2023-12-04 NOTE — H&P (Signed)
 [facility] Attending History and Physical  Amanda Paul is a 48 y.o. G0P0000 who presented to [facility] today for evaluation of acute onset abdominal pain. She has known large fibroids.   She was given IV Dilaudid  in the ED (1mg  then 0.5 mg) which was reportedly controlling her pain. She had nausea but no emesis.   She had a UFE in 09/2020. She had a normal pap and HPV negative in 06/2021. It was normal in the year prior as well and HPV negative.   Her last EMB was in 2022 and it was negative.   On review of ED note from [facility]:  CT imaging from previous visit showed significant enlargement of the uterus with what appeared to be a centrally necrotic fibroid with. Surgical consultation was recommended at that time. Consultation with Dr. Eveline with OB/GYN showed that they recommended close follow-up to her primary gynecologist. Was given ibuprofen  for continued pain management, she states that she had been managing this well however presents today due to the acute escalation and migration of her pain. She also endorses increased abdominal distention.   En route to the ER, her pain has worsened. Her oxygen status worsened due to her pain. She is unable to take deep breaths. She is now on 6L O2. She had a fever of 101.8 before she left the ED and she was given Tylenol  at 1403.    Past Medical History:  Diagnosis Date   Allergy    Arthritis    Hernia, umbilical    Hyperlipidemia    Hypertension    Morbid obesity (HCC)    Uterine fibroids    Past Surgical History:  Procedure Laterality Date   ADVANCEMENT / RECONSTRUCTION POSTERIOR TIBIAL TENDON / [facility] Left 10/09/2021   COLONOSCOPY WITH PROPOFOL  N/A 06/13/2020   Procedure: COLONOSCOPY WITH PROPOFOL ;  Surgeon: Rollin Dover, MD;  Location: [facility];  Service: Endoscopy;  Laterality: N/A;   HEMOSTASIS CLIP PLACEMENT  06/13/2020   Procedure: HEMOSTASIS CLIP PLACEMENT;  Surgeon: Rollin Dover, MD;  Location: [facility];  Service: Endoscopy;;   HERNIA REPAIR     HYSTEROSCOPY WITH D & C N/A 08/26/2014   Procedure: DILATATION AND CURETTAGE /HYSTEROSCOPY and cervical repair of cervical laceration ;  Surgeon: Nena App, MD;  Location: [facility];  Service: Gynecology;  Laterality: N/A;   IR ANGIOGRAM PELVIS SELECTIVE OR SUPRASELECTIVE  10/08/2020   IR ANGIOGRAM PELVIS SELECTIVE OR SUPRASELECTIVE  10/08/2020   IR ANGIOGRAM SELECTIVE EACH ADDITIONAL VESSEL  10/08/2020   IR EMBO TUMOR ORGAN ISCHEMIA INFARCT INC GUIDE ROADMAPPING  10/08/2020   IR RADIOLOGIST EVAL & MGMT  07/10/2020   IR RADIOLOGIST EVAL & MGMT  07/31/2020   IR RADIOLOGIST EVAL & MGMT  09/09/2020   IR RADIOLOGIST EVAL & MGMT  11/18/2020   IR RADIOLOGIST EVAL & MGMT  09/14/2021   IR US  GUIDE VASC ACCESS RIGHT  10/08/2020   POLYPECTOMY  06/13/2020   Procedure: POLYPECTOMY;  Surgeon: Rollin Dover, MD;  Location: [facility];  Service: Endoscopy;;   UMBILICAL HERNIA REPAIR     WISDOM TOOTH EXTRACTION     OB History  Gravida Para Term Preterm AB Living  0 0 0 0 0 0  SAB IAB Ectopic Multiple Live Births  0 0 0 0   Patient denies any other pertinent gynecologic issues.  No current facility-administered medications on file prior to encounter.   Current Outpatient Medications on File Prior to Encounter  Medication Sig Dispense Refill  amLODipine  (NORVASC ) 10 MG tablet TAKE 1 TABLET BY MOUTH EVERY DAY 90 tablet 3   azithromycin  (ZITHROMAX ) 250 MG tablet Take 2 tablets together on day 1, then take 1 tablet daily for 4 days 6 tablet 0   benzonatate  (TESSALON  PERLES) 100 MG capsule Take 1 capsule (100 mg total) by mouth 3 (three) times daily as needed for cough. 30 capsule 0   furosemide  (LASIX ) 40 MG tablet One tab po every day prn 30 tablet 0   ibuprofen  (ADVIL ) 600 MG tablet Take 1 tablet (600 mg total) by mouth every 6 (six) hours as needed. 30 tablet 0   meloxicam  (MOBIC ) 15 MG tablet TAKE 1 TABLET (15 MG  TOTAL) BY MOUTH DAILY. 30 tablet 3   polyethylene glycol-electrolytes (NULYTELY ) 420 g solution Take 4,000 mLs by mouth once. (Patient not taking: Reported on 11/16/2023)     predniSONE  (DELTASONE ) 20 MG tablet Take 2 tablets daily with breakfast. 10 tablet 0   rosuvastatin  (CRESTOR ) 20 MG tablet Take 1 tablet (20 mg total) by mouth daily. 90 tablet 1   Vitamin D , Ergocalciferol , (DRISDOL ) 1.25 MG (50000 UNIT) CAPS capsule Take 1 capsule (50,000 Units total) by mouth every 7 (seven) days. 12 capsule 1   Allergies  Allergen Reactions   Amoxicillin Hives, Itching and Swelling    Swollen eyes and throat.   Doxycycline  Hyclate Swelling    Bilateral lower leg swelling   Penicillins Hives and Swelling    Social History:   reports that she has never smoked. She has never used smokeless tobacco. She reports current alcohol use of about 1.0 standard drink of alcohol per week. She reports that she does not use drugs. Family History  Problem Relation Age of Onset   Hypertension Mother    Colon polyps Neg Hx    Colon cancer Neg Hx    Esophageal cancer Neg Hx    Rectal cancer Neg Hx    Stomach cancer Neg Hx     Review of Systems: Pertinent items noted in HPI and remainder of comprehensive ROS otherwise negative.  PHYSICAL EXAM: Blood pressure (!) 146/94, pulse (!) 126, temperature 100.3 F (37.9 C), temperature source Oral, resp. rate (!) 40, SpO2 92%. CONSTITUTIONAL: Well-developed, well-nourished female in no acute distress.  HENT:  Normocephalic, atraumatic, External right and left ear normal. Oropharynx is clear and moist EYES: Conjunctivae and EOM are normal. Pupils are equal, round, and reactive to light. No scleral icterus.  NECK: Normal range of motion, supple, no masses SKIN: Skin is warm and dry. No rash noted. Not diaphoretic. No erythema. No pallor. NEUROLOGIC: Alert and oriented to person, place, and time. Normal reflexes, muscle tone coordination. No cranial nerve deficit  noted. PSYCHIATRIC: Normal mood and affect. Normal behavior. Normal judgment and thought content. CARDIOVASCULAR: Normal heart rate noted, regular rhythm RESPIRATORY: Effort and breath sounds normal, no problems with respiration noted ABDOMEN: Firm, distended. Tender diffusely to palpation.  PELVIC: Not examined MUSCULOSKELETAL: Normal range of motion. No tenderness.  No cyanosis, clubbing, or edema.  2+ distal pulses.  Labs: Results for orders placed or performed during the hospital encounter of 12/04/23 (from the past 2 weeks)  Comprehensive metabolic panel   Collection Time: 12/04/23  9:27 AM  Result Value Ref Range   Sodium 139 135 - 145 mmol/L   Potassium 3.6 3.5 - 5.1 mmol/L   Chloride 101 98 - 111 mmol/L   CO2 26 22 - 32 mmol/L   Glucose, Bld 169 (H) 70 - 99  mg/dL   BUN 13 6 - 20 mg/dL   Creatinine, Ser 9.29 0.44 - 1.00 mg/dL   Calcium  8.9 8.9 - 10.3 mg/dL   Total Protein 8.7 (H) 6.5 - 8.1 g/dL   Albumin  2.5 (L) 3.5 - 5.0 g/dL   AST 21 15 - 41 U/L   ALT 16 0 - 44 U/L   Alkaline Phosphatase 62 38 - 126 U/L   Total Bilirubin 0.7 0.0 - 1.2 mg/dL   GFR, Estimated >39 >39 mL/min   Anion gap 12 5 - 15  Lipase, blood   Collection Time: 12/04/23  9:27 AM  Result Value Ref Range   Lipase 31 11 - 51 U/L  CBC with Diff   Collection Time: 12/04/23  9:27 AM  Result Value Ref Range   WBC 8.4 4.0 - 10.5 K/uL   RBC 4.18 3.87 - 5.11 MIL/uL   Hemoglobin 10.5 (L) 12.0 - 15.0 g/dL   HCT 64.0 (L) 63.9 - 53.9 %   MCV 85.9 80.0 - 100.0 fL   MCH 25.1 (L) 26.0 - 34.0 pg   MCHC 29.2 (L) 30.0 - 36.0 g/dL   RDW 83.4 (H) 88.4 - 84.4 %   Platelets 467 (H) 150 - 400 K/uL   nRBC 0.0 0.0 - 0.2 %   Neutrophils Relative % 87 %   Neutro Abs 7.2 1.7 - 7.7 K/uL   Lymphocytes Relative 8 %   Lymphs Abs 0.7 0.7 - 4.0 K/uL   Monocytes Relative 5 %   Monocytes Absolute 0.5 0.1 - 1.0 K/uL   Eosinophils Relative 0 %   Eosinophils Absolute 0.0 0.0 - 0.5 K/uL   Basophils Relative 0 %   Basophils  Absolute 0.0 0.0 - 0.1 K/uL   Immature Granulocytes 0 %   Abs Immature Granulocytes 0.03 0.00 - 0.07 K/uL   Abs Granulocyte 7.2 (H) 1.5 - 6.5 K/uL  hCG, serum, qualitative   Collection Time: 12/04/23  9:27 AM  Result Value Ref Range   Preg, Serum NEGATIVE NEGATIVE  Results for orders placed or performed during the hospital encounter of 11/24/23 (from the past 2 weeks)  Lipase, blood   Collection Time: 11/24/23  2:13 PM  Result Value Ref Range   Lipase 29 11 - 51 U/L  Comprehensive metabolic panel   Collection Time: 11/24/23  2:13 PM  Result Value Ref Range   Sodium 137 135 - 145 mmol/L   Potassium 3.3 (L) 3.5 - 5.1 mmol/L   Chloride 100 98 - 111 mmol/L   CO2 24 22 - 32 mmol/L   Glucose, Bld 102 (H) 70 - 99 mg/dL   BUN 8 6 - 20 mg/dL   Creatinine, Ser 9.39 0.44 - 1.00 mg/dL   Calcium  9.1 8.9 - 10.3 mg/dL   Total Protein 8.1 6.5 - 8.1 g/dL   Albumin  2.7 (L) 3.5 - 5.0 g/dL   AST 17 15 - 41 U/L   ALT 18 0 - 44 U/L   Alkaline Phosphatase 52 38 - 126 U/L   Total Bilirubin 0.4 0.0 - 1.2 mg/dL   GFR, Estimated >39 >39 mL/min   Anion gap 13 5 - 15  CBC   Collection Time: 11/24/23  2:13 PM  Result Value Ref Range   WBC 9.5 4.0 - 10.5 K/uL   RBC 4.09 3.87 - 5.11 MIL/uL   Hemoglobin 10.4 (L) 12.0 - 15.0 g/dL   HCT 62.9 63.9 - 53.9 %   MCV 90.5 80.0 - 100.0 fL   MCH  25.4 (L) 26.0 - 34.0 pg   MCHC 28.1 (L) 30.0 - 36.0 g/dL   RDW 83.5 (H) 88.4 - 84.4 %   Platelets 430 (H) 150 - 400 K/uL   nRBC 0.0 0.0 - 0.2 %  Urinalysis, Routine w reflex microscopic -Urine, Clean Catch   Collection Time: 11/24/23  2:23 PM  Result Value Ref Range   Color, Urine YELLOW YELLOW   APPearance CLEAR CLEAR   Specific Gravity, Urine 1.017 1.005 - 1.030   pH 6.0 5.0 - 8.0   Glucose, UA NEGATIVE NEGATIVE mg/dL   Hgb urine dipstick NEGATIVE NEGATIVE   Bilirubin Urine NEGATIVE NEGATIVE   Ketones, ur NEGATIVE NEGATIVE mg/dL   Protein, ur 30 (A) NEGATIVE mg/dL   Nitrite NEGATIVE NEGATIVE   Leukocytes,Ua  NEGATIVE NEGATIVE   RBC / HPF 0-5 0 - 5 RBC/hpf   WBC, UA 0-5 0 - 5 WBC/hpf   Bacteria, UA NONE SEEN NONE SEEN   Squamous Epithelial / HPF 0-5 0 - 5 /HPF   Mucus PRESENT   Pregnancy, urine   Collection Time: 11/24/23  2:23 PM  Result Value Ref Range   Preg Test, Ur NEGATIVE NEGATIVE    Imaging Studies: CT ABDOMEN PELVIS W CONTRAST Result Date: 12/04/2023 CLINICAL DATA:  Abdominal pain. EXAM: CT ABDOMEN AND PELVIS WITH CONTRAST TECHNIQUE: Multidetector CT imaging of the abdomen and pelvis was performed using the standard protocol following bolus administration of intravenous contrast. RADIATION DOSE REDUCTION: This exam was performed according to the departmental dose-optimization program which includes automated exposure control, adjustment of the mA and/or kV according to patient size and/or use of iterative reconstruction technique. CONTRAST:  OMNIPAQUE  IOHEXOL  300 MG/ML  SOLN COMPARISON:  11/24/2023 FINDINGS: Lower chest: Asymmetric elevation right hemidiaphragm with right base atelectasis or scarring, similar to prior. No pleural effusion. Hepatobiliary: No suspicious focal abnormality within the liver parenchyma. There is no evidence for gallstones, gallbladder wall thickening, or pericholecystic fluid. No intrahepatic or extrahepatic biliary dilation. Pancreas: No focal mass lesion. No dilatation of the main duct. No intraparenchymal cyst. No peripancreatic edema. Spleen: No splenomegaly. No suspicious focal mass lesion. Adrenals/Urinary Tract: No adrenal nodule or mass. Kidneys unremarkable. No evidence for hydroureter. The urinary bladder appears normal for the degree of distention. Stomach/Bowel: Small hiatal hernia. Stomach otherwise unremarkable. Duodenum is normally positioned as is the ligament of Treitz. Potential mild circumferential wall thickening in proximal jejunal loops. Distal small bowel is decompressed. The terminal ileum is normal. The appendix is not well visualized, but  there is no edema or inflammation in the region of the cecal tip to suggest appendicitis. Diverticuli are seen scattered along the entire length of the colon without CT findings of diverticulitis. Vascular/Lymphatic: No abdominal aortic aneurysm. No abdominal aortic atherosclerotic calcification. There is no gastrohepatic or hepatoduodenal ligament lymphadenopathy. No retroperitoneal or mesenteric lymphadenopathy. No pelvic sidewall lymphadenopathy. Reproductive: Marked uterine enlargement with multiple calcified fibroids identified. Endometrial canal is obscured and central low attenuation in the uterus is similar to prior and presumably related to fibroid disease. MRI pelvis on 08/31/2021 documented an 8 mm endometrial stripe thickness. Uterus measures 22.2 x 14.7 x 21.3 cm. This compares to 27.6 x 18.7 x 28.7 cm when measured in a similar fashion on the 11/24/2023 exam. Comparing axial image 62/2 today to axial image 56/2 previously, the central and left fluid density component has decreased, measuring 15.2 x 10.9 cm today compared to 23.4 x 15.1 cm previously. There is marked thinning of the left mild myometrium.  This decrease is also well demonstrated by comparing coronal image 62/8 today to coronal image 66/7 previously. Other: Interval development of moderate volume ascites in the abdomen and pelvis. Attenuation of the free fluid is higher than would be expected for serosanguineous fluid and may be complicated by proteinaceous debris, infectious debris, or hemorrhage. Musculoskeletal: No worrisome lytic or sclerotic osseous abnormality. IMPRESSION: 1. Interval development of moderate volume ascites in the abdomen and pelvis. Attenuation of the free fluid is higher than would be expected for serosanguineous fluid and may be complicated by proteinaceous debris, infectious debris, or hemorrhage. This interval appearance of moderate volume intraperitoneal free fluid in the 10 days since prior studies is  accompanied by definite interval decrease in size of the cystic process in the central uterus. As such, rupture of a cystic degenerated/necrotic fibroid into the peritoneal cavity would be a consideration. If the large central uterine process represents an endometrial cancer, rupture through the myometrium/serosa into the peritoneal cavity could also produce this appearance. 2. Marked uterine enlargement with multiple calcified fibroids identified. The central and left fluid density component has decreased in size since the prior study. There is marked thinning of the left myometrium. 3. Potential mild circumferential wall thickening in proximal jejunal loops. This is nonspecific but can be seen in the setting of enteritis. Reactive etiology given the new intraperitoneal free fluid is favored. 4. Small hiatal hernia. 5. Asymmetric elevation right hemidiaphragm with right base atelectasis or scarring, similar to prior. Critical Value/emergent results were called by telephone at the time of interpretation on 12/04/2023 at 11:20 am to provider JONATHAN GILLIAM , who verbally acknowledged these results. Electronically Signed   By: Camellia Candle M.D.   On: 12/04/2023 11:21   CT ABDOMEN PELVIS W CONTRAST Result Date: 11/24/2023 CLINICAL DATA:  Left upper quadrant pain EXAM: CT ABDOMEN AND PELVIS WITH CONTRAST TECHNIQUE: Multidetector CT imaging of the abdomen and pelvis was performed using the standard protocol following bolus administration of intravenous contrast. RADIATION DOSE REDUCTION: This exam was performed according to the departmental dose-optimization program which includes automated exposure control, adjustment of the mA and/or kV according to patient size and/or use of iterative reconstruction technique. CONTRAST:  OMNIPAQUE  IOHEXOL  300 MG/ML  SOLN COMPARISON:  12/08/2022 FINDINGS: Lower chest: Right basilar atelectasis is noted. Stable right middle lobe pleural base nodule is seen. Previous exam  shows to be stable for more than 2 years and considered benign. No follow-up is recommended. Hepatobiliary: No focal liver abnormality is seen. No gallstones, gallbladder wall thickening, or biliary dilatation. Pancreas: Unremarkable. No pancreatic ductal dilatation or surrounding inflammatory changes. Spleen: Normal in size without focal abnormality. Adrenals/Urinary Tract: Adrenal glands are within normal limits. Kidneys demonstrate a normal enhancement pattern bilaterally. Normal excretion is seen. The bladder is decompressed. Stomach/Bowel: Scattered diverticular changes noted within the colon. No evidence of diverticulitis is seen. The appendix is not discretely visualized although no inflammatory changes are noted to suggest appendicitis. Stomach and small bowel are within normal limits. Vascular/Lymphatic: No significant vascular findings are present. No enlarged abdominal or pelvic lymph nodes. Reproductive: The uterus is significantly enlarged measuring approximately 28 cm in transverse diameter and 17 cm in AP dimension. This is increased from the prior exam at which time it measured 19 x 13. Rim calcified fibroid is noted on the right measuring up to 10 cm. A large rim calcified hypodense lesion is noted likely representing a necrotic fibroid. An additional rim calcified fibroid is noted within. These changes are significantly  different than that seen on the prior exam. Multiple smaller rim calcified fibroids are noted as well. No adnexal mass is seen. Other: No abdominal wall hernia or abnormality. No abdominopelvic ascites. Musculoskeletal: No acute or significant osseous findings. IMPRESSION: Significant enlargement of the uterus with what appears to be a large centrally necrotic fibroid within. This has changed significantly in the interval from the prior exam. This may contribute to the patient's current symptomatology. Surgical consultation may be warranted. Diverticulosis without diverticulitis.  Mild right basilar atelectasis is seen. Electronically Signed   By: Oneil Devonshire M.D.   On: 11/24/2023 20:39    I reviewed her last few imaging reports:  08/2021: MRI -- Uterus measures 22.2 x 14.7 x 21.3 cm.   11/24/23: CT -- Significant enlargement of uterus with centrally large necrotic fibroid that has changed from prior exam (MRI 2023). Uterine size was 17x28cm.   12/04/23: CT -- Uterus measures 21x22x14. Moderate volume ascites not likely serosanguinous. Possible rupture of cystic fibroid.   Assessment: Principal Problem:   Fibroid  Plan: - Transferred from [facility] for admission for pain control and likely surgery - HgB was stable at 10.5 and 10 days ago it was 10.4. No elevated WBC.  - Discussed plan for surgery - recommend TAH/BSO. Recommend now proceeding with this emergently based on appearance of patient. She certainly appears more acute than was described.  - Risks of surgery include but are not limited to:  Bleeding - Can bleed enough to need transfusion or need for additional surgeries I.e. conversation to open surgery.  Infection - The vagina can develop cuff infection Injury to surrounding organs/tissues (i.e. bowel/bladder/ureters) - discussed how each of these is managed I.e. bladder injury requires catheter for 10-14 days after surgery Need for additional procedures - Would be specific to a complication or injury Wound complications - No specific wound unless converted to open surgery or laparoscopic Hospital re-admission - In the event of a delayed complication being recognized I.e. ureteral injury discussed VTE - Discussed risk of blood clots following delivery - We discussed she has already attempted alternatives to hysterectomy I.e. UFE.  - We discussed postop recovery - Has longstanding HTN. She had an EKG today and it is pending. She follows with Dr. Sheena and had Echo which showed mild LVH and EF is 60% - NPO - Check DIC labs, lactic acid, and repeat CBC. T&S now  stat.  - OR notified. Consent reviewed and signed.  - All questions answered   Vina Solian, MD, FACOG Obstetrician & Gynecologist, [facility] for [facility], [facility]

## 2023-12-04 NOTE — ED Notes (Signed)
 [facility] called.

## 2023-12-04 NOTE — Transfer of Care (Signed)
 Immediate Anesthesia Transfer of Care Note  Patient: Amanda Paul  Procedure(s) Performed: HYSTERECTOMY, ABDOMINAL, WITH SALPINGO-OOPHORECTOMY  Patient Location: PACU  Anesthesia Type:General  Level of Consciousness: awake, oriented, and drowsy  Airway & Oxygen Therapy: Patient Spontanous Breathing and Patient connected to face mask oxygen  Post-op Assessment: Report given to RN, Post -op Vital signs reviewed and stable, and Patient moving all extremities X 4  Post vital signs: Reviewed and stable  Last Vitals:  Vitals Value Taken Time  BP 119/68 12/04/23 18:33  Temp    Pulse 82 12/04/23 18:39  Resp 40 12/04/23 18:39  SpO2 97 % 12/04/23 18:39  Vitals shown include unfiled device data.  Last Pain:  Vitals:   12/04/23 1450  TempSrc:   PainSc: 4          Complications: No notable events documented.

## 2023-12-04 NOTE — ED Notes (Signed)
Report called Romie Minus

## 2023-12-04 NOTE — Op Note (Signed)
 Amanda Paul PROCEDURE DATE: 12/04/2023  PREOPERATIVE DIAGNOSES:  Acute abdomen, unclear etiology; fibroid uterus POSTOPERATIVE DIAGNOSES:  Uterine rupture of necrotic fibroid and sterile necrotic fluid with resulting chemical peritonitis  SURGEON:  Dr. Vina Paul ASSISTANT:  Dr. Herlene Paul.  An experienced assistant was required given the standard of surgical care given the complexity of the case.  This assistant was needed for exposure, dissection, suctioning, retraction, instrument exchange, and for overall help during the procedure.  OPERATION:  Total abdominal hysterectomy, Bilateral salpingo-oopherectomy  ANESTHESIA:  General endotracheal.  INDICATIONS: The patient is a 48 y.o. G0P0000 with the aforementioned diagnoses who desires definitive surgical management. On the preoperative visit, the risks, benefits, indications, and alternatives of the procedure were reviewed with the patient.  On the day of surgery, the risks of surgery were again discussed with the patient including but not limited to: bleeding which may require transfusion or reoperation; infection which may require antibiotics; injury to bowel, bladder, ureters or other surrounding organs; need for additional procedures; thromboembolic phenomenon, incisional problems and other postoperative/anesthesia complications. Written informed consent was obtained.    OPERATIVE FINDINGS: Fibroid uterus noted to have large hole on what is presumably the posterior aspect of the uterus although anatomy was distorted. Normal tubes and ovaries bilaterally. 10 cm necrotic fibroid found in the RUQ during irrigation. About 4L of necrotic but sterile fluid found inside the abdomen.   ESTIMATED BLOOD LOSS: 100 ml FLUIDS:  2000 ml of  crystalloid, 750 cc albumin  URINE OUTPUT:  175 ml of clear yellow urine. SPECIMENS:  Uterus, cervix,  bilateral fallopian tubes and ovaries sent to pathology COMPLICATIONS:  None  immediate   DESCRIPTION OF PROCEDURE:  The patient received intravenous antibiotics and had sequential compression devices applied to her lower extremities while in the preoperative area.   She was taken to the operating room and placed under general anesthesia without difficulty.The abdomen and perineum were prepped and draped in a sterile manner, and she was placed in a dorsal supine position.  A Foley catheter was inserted into the bladder and attached to constant drainage. The vagina was prepped as well with betadine .   After an adequate timeout was performed, a vertical skin incision was made. This incision was taken down to the fascia using the scalpel with care given to maintain good hemostasis. The fascia was incised in the midline and the fascial incision was then extended vertically using mayo scissors without difficulty. The peritoneum was then entered bluntly and then copious amounts of yellow fluid came out of the abdomen. This was suctioned out until about 4 L removed.  This peritoneal incision was then extended superiorly and inferiorly with care given to prevent bowel or bladder injury. Attention was then turned to the pelvis. An Alexis retractor was placed into the incision, and the bowel was packed away with moist laparotomy sponges. The uterus at this point was noted to be mobilized and was delivered up out of the abdomen.  The round ligaments on each side were clamped, suture ligated with 0 Vicryl, and transected with mayo scissors. Of note, all sutures used in this procedure are 0 Vicryl unless otherwise noted. The anterior and posterior leaves of the broad ligament were separated, and the ureters were inspected to be safely away from the area of dissection bilaterally.    The infundibulopelvic ligament clamped on the patient's right side. This pedicle was then clamped, cut, and doubly suture ligated with good hemostasis.  The left ovary and tube were  pulled up significantly due to  distortion of the anatomy on the left side and they were removed within the same pedicle as the round ligament.   A bladder flap was then created.  The bladder was then bluntly dissected off the lower uterine segment and cervix with good hemostasis noted. The uterine arteries were then skeletonized bilaterally and then clamped, cut, and doubly suture ligated with care given to prevent ureteral injury. The uterosacral ligaments were then clamped, cut, and ligated bilaterally.  Finally, the cardinal ligaments were clamped, cut, and ligated bilaterally.  Acutely curved clamps were placed across the vagina just under the cervix, and the specimen was amputated and sent to pathology. The vaginal cuff angles were closed with Heaney stiches with care given to incorporate the uterosacral-cardinal ligament pedicles on both sides. The middle of the vaginal cuff was closed with a series of interrupted figure-of-eight sutures with care given to incorporate the anterior pubocervical fascia and the posterior rectovaginal fascia.     The entire abdomen was copiously irrigated and hemostasis was reconfirmed at all pedicles and along the pelvic sidewall.  During our irrigation of the upper abdomen, the 10 cm necrotic fibroid was found.  The abdomen and pelvis were inspected and was otherwise normal. Irrigation was continued until the fluid was clear.   All laparotomy sponges and instruments were removed from the abdomen. A JP drain was placed in through the patient's LLQ and placed just above the vaginal cuff.   The incision was then closed in a mass closure with 0 PDS in a running fashion. The subcutaneous layer was reapproximated with 2-0 plain gut. The skin was closed with staples. Sponge, lap, needle, and instrument counts were correct times two. The patient was taken to the recovery area awake, extubated and in stable condition.   Amanda Solian MD, FACOG Obstetrician & Gynecologist, [facility] for  [employer], [facility]

## 2023-12-04 NOTE — ED Provider Notes (Signed)
 [place] [facility]ERGENCY DEPARTMENT AT [facility] Provider Note   CSN: [id number] Arrival date & time: 12/04/23  9177     Patient presents with: Abdominal Pain   Amanda Paul is a 48 y.o. female who presents to the ED today with acute abdominal pain.  Previously seen on 14 August for the same, noted history of previous uterine fibroids, hypertension, hyperlipidemia, umbilical hernia, CAD.  Initially pain was located on the left side of her abdomen but stated today after waking she had a sudden stabbing pain that was periumbilical and location.  Rates as 10/10, stabbing sharp pain.  Denies having any vomiting but does endorse nausea.  States that she had 1 solid bowel movement this morning.  Denies having any dysuria.  CT imaging from previous visit showed significant enlargement of the uterus with what appeared to be a centrally necrotic fibroid with.  Surgical consultation was recommended at that time.  Consultation with Dr. Eveline with OB/GYN showed that they recommended close follow-up to her primary gynecologist.  Was given ibuprofen  for continued pain management, she states that she had been managing this well however presents today due to the acute escalation and migration of her pain.  She also endorses increased abdominal distention.    Abdominal Pain Associated symptoms: nausea        Prior to Admission medications   Medication Sig Start Date End Date Taking? Authorizing Provider  amLODipine  (NORVASC ) 10 MG tablet TAKE 1 TABLET BY MOUTH EVERY DAY 06/27/23   Jarold Medici, MD  azithromycin  (ZITHROMAX ) 250 MG tablet Take 2 tablets together on day 1, then take 1 tablet daily for 4 days 09/24/23   Rising, Asberry, PA-C  benzonatate  (TESSALON  PERLES) 100 MG capsule Take 1 capsule (100 mg total) by mouth 3 (three) times daily as needed for cough. 10/30/23 10/29/24  Christopher Savannah, PA-C  furosemide  (LASIX ) 40 MG tablet One tab po every day prn 11/16/23   Jarold Medici, MD   ibuprofen  (ADVIL ) 600 MG tablet Take 1 tablet (600 mg total) by mouth every 6 (six) hours as needed. 11/24/23   Nivia Colon, PA-C  meloxicam  (MOBIC ) 15 MG tablet TAKE 1 TABLET (15 MG TOTAL) BY MOUTH DAILY. 09/19/23   Hyatt, Max T, DPM  polyethylene glycol-electrolytes (NULYTELY ) 420 g solution Take 4,000 mLs by mouth once. Patient not taking: Reported on 11/16/2023 09/16/23   [provider]  predniSONE  (DELTASONE ) 20 MG tablet Take 2 tablets daily with breakfast. 10/30/23   Christopher Savannah, PA-C  rosuvastatin  (CRESTOR ) 20 MG tablet Take 1 tablet (20 mg total) by mouth daily. 09/20/23 09/19/24  Jarold Medici, MD  Vitamin D , Ergocalciferol , (DRISDOL ) 1.25 MG (50000 UNIT) CAPS capsule Take 1 capsule (50,000 Units total) by mouth every 7 (seven) days. 10/05/21   Jarold Medici, MD    Allergies: Amoxicillin, Doxycycline  hyclate, and Penicillins    Review of Systems  Gastrointestinal:  Positive for abdominal distention, abdominal pain and nausea.  All other systems reviewed and are negative.   Updated Vital Signs BP 133/89   Pulse (!) 127   Temp (!) 100.4 F (38 C) (Oral)   Resp (!) 47   SpO2 90%   Physical Exam Vitals and nursing note reviewed.  Constitutional:      General: She is in acute distress.     Appearance: Normal appearance. She is obese.  HENT:     Head: Normocephalic and atraumatic.     Mouth/Throat:     Mouth: Mucous membranes are moist.  Pharynx: Oropharynx is clear.  Eyes:     Extraocular Movements: Extraocular movements intact.     Conjunctiva/sclera: Conjunctivae normal.     Pupils: Pupils are equal, round, and reactive to light.  Cardiovascular:     Rate and Rhythm: Regular rhythm. Tachycardia present.     Pulses: Normal pulses.     Heart sounds: Normal heart sounds, S1 normal and S2 normal. No murmur heard.    No friction rub. No gallop.  Pulmonary:     Effort: Pulmonary effort is normal.     Breath sounds: Normal breath sounds.  Abdominal:     General:  Abdomen is protuberant. Bowel sounds are normal.     Tenderness: There is generalized abdominal tenderness.     Comments: Abdomen is firm to palpation and diffusely tender.  Musculoskeletal:        General: Normal range of motion.     Cervical back: Normal range of motion and neck supple.     Right lower leg: No edema.     Left lower leg: No edema.  Skin:    General: Skin is warm and dry.     Capillary Refill: Capillary refill takes less than 2 seconds.  Neurological:     General: No focal deficit present.     Mental Status: She is alert. Mental status is at baseline.  Psychiatric:        Mood and Affect: Mood normal.     (all labs ordered are listed, but only abnormal results are displayed) Labs Reviewed  COMPREHENSIVE METABOLIC PANEL WITH GFR - Abnormal; Notable for the following components:      Result Value   Glucose, Bld 169 (*)    Total Protein 8.7 (*)    Albumin  2.5 (*)    All other components within normal limits  CBC WITH DIFFERENTIAL/PLATELET - Abnormal; Notable for the following components:   Hemoglobin 10.5 (*)    HCT 35.9 (*)    MCH 25.1 (*)    MCHC 29.2 (*)    RDW 16.5 (*)    Platelets 467 (*)    Abs Granulocyte 7.2 (*)    All other components within normal limits  CULTURE, BLOOD (ROUTINE X 2)  CULTURE, BLOOD (ROUTINE X 2)  LIPASE, BLOOD  HCG, SERUM, QUALITATIVE  URINALYSIS, ROUTINE W REFLEX MICROSCOPIC    EKG: None  Radiology: CT ABDOMEN PELVIS W CONTRAST Result Date: 12/04/2023 CLINICAL DATA:  Abdominal pain. EXAM: CT ABDOMEN AND PELVIS WITH CONTRAST TECHNIQUE: Multidetector CT imaging of the abdomen and pelvis was performed using the standard protocol following bolus administration of intravenous contrast. RADIATION DOSE REDUCTION: This exam was performed according to the departmental dose-optimization program which includes automated exposure control, adjustment of the mA and/or kV according to patient size and/or use of iterative reconstruction  technique. CONTRAST:  OMNIPAQUE  IOHEXOL  300 MG/ML  SOLN COMPARISON:  11/24/2023 FINDINGS: Lower chest: Asymmetric elevation right hemidiaphragm with right base atelectasis or scarring, similar to prior. No pleural effusion. Hepatobiliary: No suspicious focal abnormality within the liver parenchyma. There is no evidence for gallstones, gallbladder wall thickening, or pericholecystic fluid. No intrahepatic or extrahepatic biliary dilation. Pancreas: No focal mass lesion. No dilatation of the main duct. No intraparenchymal cyst. No peripancreatic edema. Spleen: No splenomegaly. No suspicious focal mass lesion. Adrenals/Urinary Tract: No adrenal nodule or mass. Kidneys unremarkable. No evidence for hydroureter. The urinary bladder appears normal for the degree of distention. Stomach/Bowel: Small hiatal hernia. Stomach otherwise unremarkable. Duodenum is normally positioned as is  the ligament of Treitz. Potential mild circumferential wall thickening in proximal jejunal loops. Distal small bowel is decompressed. The terminal ileum is normal. The appendix is not well visualized, but there is no edema or inflammation in the region of the cecal tip to suggest appendicitis. Diverticuli are seen scattered along the entire length of the colon without CT findings of diverticulitis. Vascular/Lymphatic: No abdominal aortic aneurysm. No abdominal aortic atherosclerotic calcification. There is no gastrohepatic or hepatoduodenal ligament lymphadenopathy. No retroperitoneal or mesenteric lymphadenopathy. No pelvic sidewall lymphadenopathy. Reproductive: Marked uterine enlargement with multiple calcified fibroids identified. Endometrial canal is obscured and central low attenuation in the uterus is similar to prior and presumably related to fibroid disease. MRI pelvis on 08/31/2021 documented an 8 mm endometrial stripe thickness. Uterus measures 22.2 x 14.7 x 21.3 cm. This compares to 27.6 x 18.7 x 28.7 cm when measured in a  similar fashion on the 11/24/2023 exam. Comparing axial image 62/2 today to axial image 56/2 previously, the central and left fluid density component has decreased, measuring 15.2 x 10.9 cm today compared to 23.4 x 15.1 cm previously. There is marked thinning of the left mild myometrium. This decrease is also well demonstrated by comparing coronal image 62/8 today to coronal image 66/7 previously. Other: Interval development of moderate volume ascites in the abdomen and pelvis. Attenuation of the free fluid is higher than would be expected for serosanguineous fluid and may be complicated by proteinaceous debris, infectious debris, or hemorrhage. Musculoskeletal: No worrisome lytic or sclerotic osseous abnormality. IMPRESSION: 1. Interval development of moderate volume ascites in the abdomen and pelvis. Attenuation of the free fluid is higher than would be expected for serosanguineous fluid and may be complicated by proteinaceous debris, infectious debris, or hemorrhage. This interval appearance of moderate volume intraperitoneal free fluid in the 10 days since prior studies is accompanied by definite interval decrease in size of the cystic process in the central uterus. As such, rupture of a cystic degenerated/necrotic fibroid into the peritoneal cavity would be a consideration. If the large central uterine process represents an endometrial cancer, rupture through the myometrium/serosa into the peritoneal cavity could also produce this appearance. 2. Marked uterine enlargement with multiple calcified fibroids identified. The central and left fluid density component has decreased in size since the prior study. There is marked thinning of the left myometrium. 3. Potential mild circumferential wall thickening in proximal jejunal loops. This is nonspecific but can be seen in the setting of enteritis. Reactive etiology given the new intraperitoneal free fluid is favored. 4. Small hiatal hernia. 5. Asymmetric elevation  right hemidiaphragm with right base atelectasis or scarring, similar to prior. Critical Value/emergent results were called by telephone at the time of interpretation on 12/04/2023 at 11:20 am to provider Shilpa Bushee , who verbally acknowledged these results. Electronically Signed   By: Camellia Candle M.D.   On: 12/04/2023 11:21     Procedures   Medications Ordered in the ED  ciprofloxacin  (CIPRO ) IVPB 400 mg (400 mg Intravenous New Bag/Given 12/04/23 1153)  prenatal multivitamin tablet 1 tablet (has no administration in time range)  sodium chloride  flush (NS) 0.9 % injection 3 mL (has no administration in time range)  sodium chloride  flush (NS) 0.9 % injection 3 mL (has no administration in time range)  0.9 %  sodium chloride  infusion (has no administration in time range)  oxyCODONE -acetaminophen  (PERCOCET/ROXICET) 5-325 MG per tablet 1-2 tablet (has no administration in time range)  ibuprofen  (ADVIL ) tablet 800 mg (has no administration  in time range)  acetaminophen  (TYLENOL ) tablet 650 mg (has no administration in time range)  HYDROmorphone  (DILAUDID ) injection 0.2-0.6 mg (has no administration in time range)  polyethylene glycol (MIRALAX  / GLYCOLAX ) packet 17 g (has no administration in time range)  simethicone  (MYLICON) chewable tablet 80 mg (has no administration in time range)  ondansetron  (ZOFRAN ) tablet 4 mg (has no administration in time range)    Or  ondansetron  (ZOFRAN ) injection 4 mg (has no administration in time range)  famotidine  (PEPCID ) IVPB 20 mg premix (has no administration in time range)  diphenhydrAMINE  (BENADRYL ) injection 25 mg (has no administration in time range)  HYDROmorphone  (DILAUDID ) injection 1 mg (1 mg Intravenous Given 12/04/23 0930)  ondansetron  (ZOFRAN ) injection 4 mg (4 mg Intravenous Given 12/04/23 0929)  HYDROmorphone  (DILAUDID ) injection 0.5 mg (0.5 mg Intravenous Given 12/04/23 1049)  iohexol  (OMNIPAQUE ) 300 MG/ML solution 100 mL (100 mLs Intravenous  Contrast Given 12/04/23 1040)    Clinical Course as of 12/04/23 1159  Sun Dec 04, 2023  1016 Patient is tachypneic and hyperventilating secondary to abdominal pain. [JG]    Clinical Course User Index [JG] Myriam Dorn BROCKS, PA                                 Medical Decision Making Amount and/or Complexity of Data Reviewed Labs: ordered. Radiology: ordered.  Risk Prescription drug management. Decision regarding hospitalization.   Medical Decision Making:   Amanda Paul is a 48 y.o. female who presented to the ED today with severe abdominal pain detailed above.    External chart has been reviewed including previous labs, imaging, admission records. Patient's presentation is complicated by their history of uterine leiomyoma.  Patient placed on continuous vitals and telemetry monitoring while in ED which was reviewed periodically.  Complete initial physical exam performed, notably the patient  was alert and oriented and appears in visible distress.  Vital signs are stable she is tachycardic and tachypneic likely secondary to pain.  Abdomen is firm to palpation with generalized tenderness..    Reviewed and confirmed nursing documentation for past medical history, family history, social history.    Initial Assessment:   With the patient's presentation of diffuse abdominal pain and increased abdominal swelling, differential diagnosis extends to possible mesenteric ischemia, bowel obstruction, complication secondary to previously noted necrotic fibroid of the uterus.  Initial Plan:  Obtain CT of the abdomen and pelvis to rule out acute intra-abdominal pathology Consider obtaining MRI as previously ordered for assessment of uterine leiomyoma Screening labs including CBC and Metabolic panel to evaluate for infectious or metabolic etiology of disease.  Obtain serum hCG to establish safety of radiologic imaging. Urinalysis with reflex culture ordered to evaluate for UTI or  relevant urologic/nephrologic pathology.  EKG to evaluate for cardiac pathology Objective evaluation as below reviewed   Initial Study Results:   Laboratory  All laboratory results reviewed without evidence of clinically relevant pathology.   Exceptions include: Hemoglobin 10.5, platelets 467  EKG EKG was reviewed independently. Rate, rhythm, axis, intervals all examined and without medically relevant abnormality. ST segments without concerns for elevations.    Radiology:  All images reviewed independently. Agree with radiology report at this time.   CT ABDOMEN PELVIS W CONTRAST Result Date: 12/04/2023 CLINICAL DATA:  Abdominal pain. EXAM: CT ABDOMEN AND PELVIS WITH CONTRAST TECHNIQUE: Multidetector CT imaging of the abdomen and pelvis was performed using the standard protocol following bolus administration  of intravenous contrast. RADIATION DOSE REDUCTION: This exam was performed according to the departmental dose-optimization program which includes automated exposure control, adjustment of the mA and/or kV according to patient size and/or use of iterative reconstruction technique. CONTRAST:  OMNIPAQUE  IOHEXOL  300 MG/ML  SOLN COMPARISON:  11/24/2023 FINDINGS: Lower chest: Asymmetric elevation right hemidiaphragm with right base atelectasis or scarring, similar to prior. No pleural effusion. Hepatobiliary: No suspicious focal abnormality within the liver parenchyma. There is no evidence for gallstones, gallbladder wall thickening, or pericholecystic fluid. No intrahepatic or extrahepatic biliary dilation. Pancreas: No focal mass lesion. No dilatation of the main duct. No intraparenchymal cyst. No peripancreatic edema. Spleen: No splenomegaly. No suspicious focal mass lesion. Adrenals/Urinary Tract: No adrenal nodule or mass. Kidneys unremarkable. No evidence for hydroureter. The urinary bladder appears normal for the degree of distention. Stomach/Bowel: Small hiatal hernia. Stomach otherwise  unremarkable. Duodenum is normally positioned as is the ligament of Treitz. Potential mild circumferential wall thickening in proximal jejunal loops. Distal small bowel is decompressed. The terminal ileum is normal. The appendix is not well visualized, but there is no edema or inflammation in the region of the cecal tip to suggest appendicitis. Diverticuli are seen scattered along the entire length of the colon without CT findings of diverticulitis. Vascular/Lymphatic: No abdominal aortic aneurysm. No abdominal aortic atherosclerotic calcification. There is no gastrohepatic or hepatoduodenal ligament lymphadenopathy. No retroperitoneal or mesenteric lymphadenopathy. No pelvic sidewall lymphadenopathy. Reproductive: Marked uterine enlargement with multiple calcified fibroids identified. Endometrial canal is obscured and central low attenuation in the uterus is similar to prior and presumably related to fibroid disease. MRI pelvis on 08/31/2021 documented an 8 mm endometrial stripe thickness. Uterus measures 22.2 x 14.7 x 21.3 cm. This compares to 27.6 x 18.7 x 28.7 cm when measured in a similar fashion on the 11/24/2023 exam. Comparing axial image 62/2 today to axial image 56/2 previously, the central and left fluid density component has decreased, measuring 15.2 x 10.9 cm today compared to 23.4 x 15.1 cm previously. There is marked thinning of the left mild myometrium. This decrease is also well demonstrated by comparing coronal image 62/8 today to coronal image 66/7 previously. Other: Interval development of moderate volume ascites in the abdomen and pelvis. Attenuation of the free fluid is higher than would be expected for serosanguineous fluid and may be complicated by proteinaceous debris, infectious debris, or hemorrhage. Musculoskeletal: No worrisome lytic or sclerotic osseous abnormality. IMPRESSION: 1. Interval development of moderate volume ascites in the abdomen and pelvis. Attenuation of the free fluid is  higher than would be expected for serosanguineous fluid and may be complicated by proteinaceous debris, infectious debris, or hemorrhage. This interval appearance of moderate volume intraperitoneal free fluid in the 10 days since prior studies is accompanied by definite interval decrease in size of the cystic process in the central uterus. As such, rupture of a cystic degenerated/necrotic fibroid into the peritoneal cavity would be a consideration. If the large central uterine process represents an endometrial cancer, rupture through the myometrium/serosa into the peritoneal cavity could also produce this appearance. 2. Marked uterine enlargement with multiple calcified fibroids identified. The central and left fluid density component has decreased in size since the prior study. There is marked thinning of the left myometrium. 3. Potential mild circumferential wall thickening in proximal jejunal loops. This is nonspecific but can be seen in the setting of enteritis. Reactive etiology given the new intraperitoneal free fluid is favored. 4. Small hiatal hernia. 5. Asymmetric elevation right hemidiaphragm  with right base atelectasis or scarring, similar to prior. Critical Value/emergent results were called by telephone at the time of interpretation on 12/04/2023 at 11:20 am to provider Isobella Ascher , who verbally acknowledged these results. Electronically Signed   By: Camellia Candle M.D.   On: 12/04/2023 11:21   CT ABDOMEN PELVIS W CONTRAST Result Date: 11/24/2023 CLINICAL DATA:  Left upper quadrant pain EXAM: CT ABDOMEN AND PELVIS WITH CONTRAST TECHNIQUE: Multidetector CT imaging of the abdomen and pelvis was performed using the standard protocol following bolus administration of intravenous contrast. RADIATION DOSE REDUCTION: This exam was performed according to the departmental dose-optimization program which includes automated exposure control, adjustment of the mA and/or kV according to patient size and/or  use of iterative reconstruction technique. CONTRAST:  OMNIPAQUE  IOHEXOL  300 MG/ML  SOLN COMPARISON:  12/08/2022 FINDINGS: Lower chest: Right basilar atelectasis is noted. Stable right middle lobe pleural base nodule is seen. Previous exam shows to be stable for more than 2 years and considered benign. No follow-up is recommended. Hepatobiliary: No focal liver abnormality is seen. No gallstones, gallbladder wall thickening, or biliary dilatation. Pancreas: Unremarkable. No pancreatic ductal dilatation or surrounding inflammatory changes. Spleen: Normal in size without focal abnormality. Adrenals/Urinary Tract: Adrenal glands are within normal limits. Kidneys demonstrate a normal enhancement pattern bilaterally. Normal excretion is seen. The bladder is decompressed. Stomach/Bowel: Scattered diverticular changes noted within the colon. No evidence of diverticulitis is seen. The appendix is not discretely visualized although no inflammatory changes are noted to suggest appendicitis. Stomach and small bowel are within normal limits. Vascular/Lymphatic: No significant vascular findings are present. No enlarged abdominal or pelvic lymph nodes. Reproductive: The uterus is significantly enlarged measuring approximately 28 cm in transverse diameter and 17 cm in AP dimension. This is increased from the prior exam at which time it measured 19 x 13. Rim calcified fibroid is noted on the right measuring up to 10 cm. A large rim calcified hypodense lesion is noted likely representing a necrotic fibroid. An additional rim calcified fibroid is noted within. These changes are significantly different than that seen on the prior exam. Multiple smaller rim calcified fibroids are noted as well. No adnexal mass is seen. Other: No abdominal wall hernia or abnormality. No abdominopelvic ascites. Musculoskeletal: No acute or significant osseous findings. IMPRESSION: Significant enlargement of the uterus with what appears to be a large  centrally necrotic fibroid within. This has changed significantly in the interval from the prior exam. This may contribute to the patient's current symptomatology. Surgical consultation may be warranted. Diverticulosis without diverticulitis. Mild right basilar atelectasis is seen. Electronically Signed   By: Oneil Devonshire M.D.   On: 11/24/2023 20:39      Consults: Case discussed with Dr. Jayne with OB/GYN.   Reassessment and Plan:   There is significant findings on reimaging, with free fluid noted in the abdomen as well as enlarged uterus with likely ruptured necrotic fibroid.  Consulted with OB/GYN as noted for admission for likely surgical intervention.  Have also begun patient on IV antibiotics for same.  Continuing IV pain management, admission has been discussed with the patient with transfer to [facility] for OB/GYN admission.       Final diagnoses:  Uterine leiomyoma, unspecified location  Other ascites    ED Discharge Orders     None          Myriam Dorn BROCKS, [place] 12/04/23 1159    Charlyn Sora, MD 12/06/23 1537

## 2023-12-04 NOTE — Anesthesia Preprocedure Evaluation (Signed)
 Anesthesia Evaluation  Patient identified by MRN, date of birth, ID band Patient awake    Reviewed: Allergy & Precautions, NPO status , Patient's Chart, lab work & pertinent test results  Airway Mallampati: II  TM Distance: >3 FB Neck ROM: Full    Dental  (+) Teeth Intact, Dental Advisory Given   Pulmonary neg pulmonary ROS   Pulmonary exam normal breath sounds clear to auscultation       Cardiovascular hypertension, Pt. on medications + CAD   Rhythm:Regular Rate:Tachycardia     Neuro/Psych negative neurological ROS     GI/Hepatic negative GI ROS, Neg liver ROS,,,  Endo/Other    Class 3 obesity  Renal/GU negative Renal ROS     Musculoskeletal  (+) Arthritis ,    Abdominal   Peds  Hematology  (+) Blood dyscrasia, anemia   Anesthesia Other Findings Day of surgery medications reviewed with the patient.  Reproductive/Obstetrics                              Anesthesia Physical Anesthesia Plan  ASA: 3 and emergent  Anesthesia Plan: General   Post-op Pain Management: Ofirmev  IV (intra-op)*   Induction: Intravenous, Rapid sequence and Cricoid pressure planned  PONV Risk Score and Plan: 4 or greater and Scopolamine  patch - Pre-op, Dexamethasone  and Ondansetron   Airway Management Planned: Oral ETT  Additional Equipment:   Intra-op Plan:   Post-operative Plan: Extubation in OR  Informed Consent: I have reviewed the patients History and Physical, chart, labs and discussed the procedure including the risks, benefits and alternatives for the proposed anesthesia with the patient or authorized representative who has indicated his/her understanding and acceptance.     Dental advisory given  Plan Discussed with: CRNA  Anesthesia Plan Comments:         Anesthesia Quick Evaluation

## 2023-12-04 NOTE — Anesthesia Procedure Notes (Signed)
 Procedure Name: Intubation Date/Time: 12/04/2023 4:26 PM  Performed by: Mollie Olivia SAUNDERS, CRNAPre-anesthesia Checklist: Patient identified, Emergency Drugs available, Suction available and Patient being monitored Patient Re-evaluated:Patient Re-evaluated prior to induction Oxygen Delivery Method: Circle system utilized Preoxygenation: Pre-oxygenation with 100% oxygen Induction Type: IV induction Laryngoscope Size: Glidescope and 3 Grade View: Grade I Tube type: Oral Tube size: 7.0 mm Number of attempts: 1 Airway Equipment and Method: Rigid stylet Placement Confirmation: ETT inserted through vocal cords under direct vision, positive ETCO2 and breath sounds checked- equal and bilateral Tube secured with: Tape Dental Injury: Teeth and Oropharynx as per pre-operative assessment

## 2023-12-04 NOTE — ED Triage Notes (Signed)
 Pt c/o lower abdominal pain that started about 30 min ago. Pt is supposed to have a MRI tomorrow for her fibroids. Pt denies any vomiting currently.

## 2023-12-05 ENCOUNTER — Other Ambulatory Visit

## 2023-12-05 ENCOUNTER — Inpatient Hospital Stay (HOSPITAL_COMMUNITY)

## 2023-12-05 ENCOUNTER — Encounter (HOSPITAL_COMMUNITY): Payer: Self-pay | Admitting: Obstetrics & Gynecology

## 2023-12-05 DIAGNOSIS — D219 Benign neoplasm of connective and other soft tissue, unspecified: Secondary | ICD-10-CM | POA: Diagnosis not present

## 2023-12-05 LAB — BLOOD GAS, ARTERIAL
Acid-Base Excess: 8 mmol/L — ABNORMAL HIGH (ref 0.0–2.0)
Bicarbonate: 32.8 mmol/L — ABNORMAL HIGH (ref 20.0–28.0)
Drawn by: 523881
O2 Saturation: 95.6 %
Patient temperature: 37.5
pCO2 arterial: 46 mmHg (ref 32–48)
pH, Arterial: 7.46 — ABNORMAL HIGH (ref 7.35–7.45)
pO2, Arterial: 66 mmHg — ABNORMAL LOW (ref 83–108)

## 2023-12-05 LAB — CBC
HCT: 29.3 % — ABNORMAL LOW (ref 36.0–46.0)
HCT: 30.7 % — ABNORMAL LOW (ref 36.0–46.0)
Hemoglobin: 8.8 g/dL — ABNORMAL LOW (ref 12.0–15.0)
Hemoglobin: 9.4 g/dL — ABNORMAL LOW (ref 12.0–15.0)
MCH: 25 pg — ABNORMAL LOW (ref 26.0–34.0)
MCH: 25.5 pg — ABNORMAL LOW (ref 26.0–34.0)
MCHC: 30 g/dL (ref 30.0–36.0)
MCHC: 30.6 g/dL (ref 30.0–36.0)
MCV: 83.2 fL (ref 80.0–100.0)
MCV: 83.4 fL (ref 80.0–100.0)
Platelets: 359 K/uL (ref 150–400)
Platelets: 361 K/uL (ref 150–400)
RBC: 3.52 MIL/uL — ABNORMAL LOW (ref 3.87–5.11)
RBC: 3.68 MIL/uL — ABNORMAL LOW (ref 3.87–5.11)
RDW: 16.2 % — ABNORMAL HIGH (ref 11.5–15.5)
RDW: 16.4 % — ABNORMAL HIGH (ref 11.5–15.5)
WBC: 10.5 K/uL (ref 4.0–10.5)
WBC: 17 K/uL — ABNORMAL HIGH (ref 4.0–10.5)
nRBC: 0 % (ref 0.0–0.2)
nRBC: 0 % (ref 0.0–0.2)

## 2023-12-05 LAB — URINALYSIS, ROUTINE W REFLEX MICROSCOPIC
Bilirubin Urine: NEGATIVE
Glucose, UA: NEGATIVE mg/dL
Ketones, ur: NEGATIVE mg/dL
Leukocytes,Ua: NEGATIVE
Nitrite: NEGATIVE
Protein, ur: 30 mg/dL — AB
Specific Gravity, Urine: >1.046 — ABNORMAL HIGH (ref 1.005–1.030)
pH: 5 (ref 5.0–8.0)

## 2023-12-05 LAB — HEMOGLOBIN A1C
Hgb A1c MFr Bld: 6.5 % — ABNORMAL HIGH (ref 4.8–5.6)
Mean Plasma Glucose: 139.85 mg/dL

## 2023-12-05 LAB — TROPONIN I (HIGH SENSITIVITY)
Troponin I (High Sensitivity): 9 ng/L (ref <18)
Troponin I (High Sensitivity): 14 ng/L (ref <18)

## 2023-12-05 LAB — COMPREHENSIVE METABOLIC PANEL WITH GFR
ALT: 13 U/L (ref 0–44)
AST: 14 U/L — ABNORMAL LOW (ref 15–41)
Albumin: 2 g/dL — ABNORMAL LOW (ref 3.5–5.0)
Alkaline Phosphatase: 38 U/L (ref 38–126)
Anion gap: 6 (ref 5–15)
BUN: 11 mg/dL (ref 6–20)
CO2: 26 mmol/L (ref 22–32)
Calcium: 8.1 mg/dL — ABNORMAL LOW (ref 8.9–10.3)
Chloride: 105 mmol/L (ref 98–111)
Creatinine, Ser: 0.52 mg/dL (ref 0.44–1.00)
GFR, Estimated: >60 mL/min (ref >60)
Glucose, Bld: 122 mg/dL — ABNORMAL HIGH (ref 70–99)
Potassium: 3.7 mmol/L (ref 3.5–5.1)
Sodium: 137 mmol/L (ref 135–145)
Total Bilirubin: 0.4 mg/dL (ref 0.0–1.2)
Total Protein: 6.2 g/dL — ABNORMAL LOW (ref 6.5–8.1)

## 2023-12-05 LAB — MAGNESIUM: Magnesium: 2 mg/dL (ref 1.7–2.4)

## 2023-12-05 LAB — TSH: TSH: 0.334 u[IU]/mL — ABNORMAL LOW (ref 0.350–4.500)

## 2023-12-05 LAB — PROCALCITONIN: Procalcitonin: 52.41 ng/mL

## 2023-12-05 LAB — BRAIN NATRIURETIC PEPTIDE: B Natriuretic Peptide: 226.1 pg/mL — ABNORMAL HIGH (ref 0.0–100.0)

## 2023-12-05 LAB — T4, FREE: Free T4: 1.43 ng/dL — ABNORMAL HIGH (ref 0.61–1.12)

## 2023-12-05 LAB — GLUCOSE, CAPILLARY: Glucose-Capillary: 100 mg/dL — ABNORMAL HIGH (ref 70–99)

## 2023-12-05 MED ORDER — POLYETHYLENE GLYCOL 3350 17 G PO PACK
17.0000 g | PACK | ORAL | Status: DC
  Administered 2023-12-06 – 2023-12-16 (×5): 17 g via ORAL
  Filled 2023-12-05 (×6): qty 1

## 2023-12-05 MED ORDER — HYDROMORPHONE HCL 1 MG/ML IJ SOLN
0.5000 mg | INTRAMUSCULAR | Status: DC | PRN
  Administered 2023-12-06 – 2023-12-14 (×5): 0.5 mg via INTRAVENOUS
  Filled 2023-12-05 (×5): qty 0.5

## 2023-12-05 MED ORDER — VANCOMYCIN HCL 750 MG/150ML IV SOLN
750.0000 mg | Freq: Three times a day (TID) | INTRAVENOUS | Status: DC
  Administered 2023-12-06 – 2023-12-08 (×7): 750 mg via INTRAVENOUS
  Filled 2023-12-05 (×11): qty 150

## 2023-12-05 MED ORDER — PANTOPRAZOLE SODIUM 40 MG IV SOLR
40.0000 mg | Freq: Two times a day (BID) | INTRAVENOUS | Status: DC
  Administered 2023-12-05 – 2023-12-07 (×5): 40 mg via INTRAVENOUS
  Filled 2023-12-05 (×5): qty 10

## 2023-12-05 MED ORDER — IOHEXOL 350 MG/ML SOLN
100.0000 mL | Freq: Once | INTRAVENOUS | Status: AC | PRN
  Administered 2023-12-05: 175 mL via INTRAVENOUS

## 2023-12-05 MED ORDER — HYDROMORPHONE HCL 1 MG/ML IJ SOLN: 0.2000 mg | INTRAMUSCULAR | Status: DC | PRN

## 2023-12-05 MED ORDER — LORAZEPAM 0.5 MG PO TABS: 0.5000 mg | ORAL_TABLET | ORAL | Status: DC | PRN

## 2023-12-05 MED ORDER — VANCOMYCIN HCL 10 G IV SOLR
2500.0000 mg | Freq: Once | INTRAVENOUS | Status: AC
  Administered 2023-12-05: 2500 mg via INTRAVENOUS
  Filled 2023-12-05: qty 25
  Filled 2023-12-05: qty 2500

## 2023-12-05 MED ORDER — FUROSEMIDE 10 MG/ML IJ SOLN
20.0000 mg | Freq: Three times a day (TID) | INTRAMUSCULAR | Status: DC
  Administered 2023-12-05: 20 mg via INTRAVENOUS
  Filled 2023-12-05 (×2): qty 2

## 2023-12-05 MED ORDER — SODIUM CHLORIDE 0.9 % IV SOLN
2.0000 g | Freq: Three times a day (TID) | INTRAVENOUS | Status: DC
  Administered 2023-12-05 – 2023-12-09 (×11): 2 g via INTRAVENOUS
  Filled 2023-12-05 (×12): qty 12.5

## 2023-12-05 MED ORDER — FAMOTIDINE 20 MG PO TABS
20.0000 mg | ORAL_TABLET | Freq: Two times a day (BID) | ORAL | Status: DC
  Administered 2023-12-05 – 2023-12-17 (×25): 20 mg via ORAL
  Filled 2023-12-05 (×25): qty 1

## 2023-12-05 MED ORDER — INSULIN ASPART 100 UNIT/ML IJ SOLN
0.0000 [IU] | Freq: Three times a day (TID) | INTRAMUSCULAR | Status: DC
  Administered 2023-12-07 – 2023-12-09 (×2): 2 [IU] via SUBCUTANEOUS

## 2023-12-05 MED ORDER — DIAZEPAM 5 MG/ML IJ SOLN: 2.5000 mg | Freq: Three times a day (TID) | INTRAMUSCULAR | Status: DC | PRN

## 2023-12-05 MED ORDER — SIMETHICONE 80 MG PO CHEW
80.0000 mg | CHEWABLE_TABLET | Freq: Three times a day (TID) | ORAL | Status: DC
  Administered 2023-12-05 – 2023-12-17 (×37): 80 mg via ORAL
  Filled 2023-12-05 (×37): qty 1

## 2023-12-05 MED ORDER — ENOXAPARIN SODIUM 60 MG/0.6ML IJ SOSY
60.0000 mg | PREFILLED_SYRINGE | INTRAMUSCULAR | Status: DC
  Administered 2023-12-05 – 2023-12-13 (×8): 60 mg via SUBCUTANEOUS
  Filled 2023-12-05 (×8): qty 0.6

## 2023-12-05 NOTE — Consult Note (Signed)
 Initial Consultation Note   Patient: Amanda Paul FMW:[id number] DOB: 17-May-1975 PCP: Jarold Medici, MD DOA: 12/04/2023 DOS: the patient was seen and examined on 12/05/2023 Primary service: Izell Harari, MD  Referring physician: Dr. Izell Reason for consult: Medical management and new new onset acute respiratory failure with hypoxia  Assessment/Plan: >>Acute respiratory failure with hypoxia: SpO2: 92 % O2 Flow Rate (L/min): 1 L/min Patient is a 48 year old morbidly obese female postop day 1 from an elective hysterectomy yesterday developing respiratory failure requiring oxygen and found to have pleural effusions with no prior diagnosis of CHF.  Patient also found to have hypoalbuminemia and reports that her legs had started to swell bilaterally symmetrically few weeks ago for which her doctor had given her Lasix .  Patient has no history of CHF liver cirrhosis portal hypertension alcoholic liver disease or any known nephrotic syndrome or kidney issues. Differentials include CHF, nephrotic syndrome, liver cirrhosis however in light of her presentation with her elevated BNP pleural effusions and her abnormal echo in April I suspect this is more related to acute onset of diastolic dysfunction and diastolic CHF. Differentials also include cancer, endometrial pathology pending. We will proceed with a repeat 2D echo to assess ejection fraction, troponin is pending, trial of Lasix  20 mg IV 3 times daily x 3 doses with as needed parameters for blood pressure. Cardiology consult once echo is resulted.   Urine microalbumin. CTA PE protocol inconclusive. Continue with indwelling Foley for strict I's and O's. Discussed with patient about volume overload probable diagnosis of CHF and pending workup.   >> Morbid obesity/Diabetes mellitus type 2: Discussed with patient that because of her previous A1c of 6.9 current A1c of 6.5 her risk factors of obesity and hypertension and abnormal  echocardiogram it is prudent to start low-dose diabetes treatment patient did not prefer metformin  and suggested that she go home on Januvia 25 mg.  Will defer to PCP for options of Ozempic which will benefit patient for her weight loss as well.  Patient started on glycemic protocol.  >> Essential hypertension: Vitals:   12/04/23 1845 12/04/23 1900 12/04/23 1915 12/04/23 1930  BP: 109/77 112/78 116/77 116/77   12/04/23 1945 12/04/23 2000 12/04/23 2028 12/04/23 2055  BP: 114/77 106/66 (!) 106/58 120/72   12/04/23 2142 12/04/23 2356 12/05/23 0453 12/05/23 0746  BP: 108/65 118/65 121/63 120/64  Will currently hold patient's amlodipine  to allow room for diuretic therapy.  >>Anemia: Anemia since 11/24/2023, would have advised against any and all NSAIDs, discontinue Mobic  discontinue ketorolac  discontinue ibuprofen .  Stool occult IV PPI-type and screen.   TRH will continue to follow the patient.  HPI: Amanda Paul is a 48 y.o. female with past medical history of obesity, essential hypertension, uterine fibroids, allergy, admitted on OB service for hysterectomy for necrotic fibroid. Patient postprocedure developed hypoxia requiring 4 L nasal cannula initially along with pleural effusions.  Consult requested for evaluation of same. Review of Systems  Respiratory:  Positive for shortness of breath.   Cardiovascular:  Positive for leg swelling.  Gastrointestinal:  Positive for abdominal pain.    Past Medical History:  Diagnosis Date   Allergy    Arthritis    Hernia, umbilical    Hyperlipidemia    Hypertension    Low grade squamous intraepithelial lesion (LGSIL) on cervicovaginal cytologic smear 06/10/2000   Morbid obesity (HCC)    Pain in right hand 01/19/2019   Umbilical hernia 04/14/2012   Uterine fibroids    Past Surgical History:  Procedure  Laterality Date   ADVANCEMENT / RECONSTRUCTION POSTERIOR TIBIAL TENDON / [facility] Left 10/09/2021   COLONOSCOPY WITH PROPOFOL  N/A  06/13/2020   Procedure: COLONOSCOPY WITH PROPOFOL ;  Surgeon: Rollin Dover, MD;  Location: [facility] ENDOSCOPY;  Service: Endoscopy;  Laterality: N/A;   HEMOSTASIS CLIP PLACEMENT  06/13/2020   Procedure: HEMOSTASIS CLIP PLACEMENT;  Surgeon: Rollin Dover, MD;  Location: [facility] ENDOSCOPY;  Service: Endoscopy;;   HERNIA REPAIR     HYSTERECTOMY ABDOMINAL WITH SALPINGO-OOPHORECTOMY N/A 12/04/2023   Procedure: HYSTERECTOMY, ABDOMINAL, WITH SALPINGO-OOPHORECTOMY;  Surgeon: Jayne Vonn DEL, MD;  Location: [facility] OR;  Service: Gynecology;  Laterality: N/A;   HYSTEROSCOPY WITH D & C N/A 08/26/2014   Procedure: DILATATION AND CURETTAGE /HYSTEROSCOPY and cervical repair of cervical laceration ;  Surgeon: Nena App, MD;  Location: [facility] ORS;  Service: Gynecology;  Laterality: N/A;   IR ANGIOGRAM PELVIS SELECTIVE OR SUPRASELECTIVE  10/08/2020   IR ANGIOGRAM PELVIS SELECTIVE OR SUPRASELECTIVE  10/08/2020   IR ANGIOGRAM SELECTIVE EACH ADDITIONAL VESSEL  10/08/2020   IR EMBO TUMOR ORGAN ISCHEMIA INFARCT INC GUIDE ROADMAPPING  10/08/2020   IR RADIOLOGIST EVAL & MGMT  07/10/2020   IR RADIOLOGIST EVAL & MGMT  07/31/2020   IR RADIOLOGIST EVAL & MGMT  09/09/2020   IR RADIOLOGIST EVAL & MGMT  11/18/2020   IR RADIOLOGIST EVAL & MGMT  09/14/2021   IR US  GUIDE VASC ACCESS RIGHT  10/08/2020   POLYPECTOMY  06/13/2020   Procedure: POLYPECTOMY;  Surgeon: Rollin Dover, MD;  Location: [facility];  Service: Endoscopy;;   UMBILICAL HERNIA REPAIR     WISDOM TOOTH EXTRACTION     Social History:  reports that she has never smoked. She has never used smokeless tobacco. She reports current alcohol use of about 1.0 standard drink of alcohol per week. She reports that she does not use drugs.  Allergies  Allergen Reactions   Amoxicillin Hives, Itching and Swelling    Swollen eyes and throat.   Doxycycline  Hyclate Swelling    Bilateral lower leg swelling   Penicillins Hives and Swelling    Family History  Problem Relation Age of Onset    Hypertension Mother    Colon polyps Neg Hx    Colon cancer Neg Hx    Esophageal cancer Neg Hx    Rectal cancer Neg Hx    Stomach cancer Neg Hx     Prior to Admission medications   Medication Sig Start Date End Date Taking? Authorizing Provider  amLODipine  (NORVASC ) 10 MG tablet TAKE 1 TABLET BY MOUTH EVERY DAY 06/27/23   Jarold Medici, MD  azithromycin  (ZITHROMAX ) 250 MG tablet Take 2 tablets together on day 1, then take 1 tablet daily for 4 days 09/24/23   Rising, Asberry, PA-C  benzonatate  (TESSALON  PERLES) 100 MG capsule Take 1 capsule (100 mg total) by mouth 3 (three) times daily as needed for cough. 10/30/23 10/29/24  Christopher Savannah, PA-C  furosemide  (LASIX ) 40 MG tablet One tab po every day prn 11/16/23   Jarold Medici, MD  ibuprofen  (ADVIL ) 600 MG tablet Take 1 tablet (600 mg total) by mouth every 6 (six) hours as needed. 11/24/23   Nivia Colon, PA-C  meloxicam  (MOBIC ) 15 MG tablet TAKE 1 TABLET (15 MG TOTAL) BY MOUTH DAILY. 09/19/23   Hyatt, Max T, DPM  polyethylene glycol-electrolytes (NULYTELY ) 420 g solution Take 4,000 mLs by mouth once. Patient not taking: Reported on 11/16/2023 09/16/23   [provider]  predniSONE  (DELTASONE ) 20 MG tablet Take 2 tablets daily with breakfast.  10/30/23   Christopher Savannah, PA-C  rosuvastatin  (CRESTOR ) 20 MG tablet Take 1 tablet (20 mg total) by mouth daily. 09/20/23 09/19/24  Jarold Medici, MD  Vitamin D , Ergocalciferol , (DRISDOL ) 1.25 MG (50000 UNIT) CAPS capsule Take 1 capsule (50,000 Units total) by mouth every 7 (seven) days. 10/05/21   Jarold Medici, MD    Physical Exam: Vitals:   12/05/23 0700 12/05/23 0746 12/05/23 0750 12/05/23 0755  BP:  120/64    Pulse:  87    Resp:  20    Temp:  98.6 F (37 C)    TempSrc:  Oral    SpO2: 94%  91% 92%   Physical Exam Constitutional:      General: She is not in acute distress.    Appearance: She is obese. She is not ill-appearing.  Eyes:     Extraocular Movements: Extraocular movements intact.   Cardiovascular:     Rate and Rhythm: Normal rate and regular rhythm.     Pulses: Normal pulses.     Heart sounds: Normal heart sounds.  Pulmonary:     Effort: Pulmonary effort is normal.     Breath sounds: Rales present.  Abdominal:     General: Bowel sounds are normal.     Palpations: Abdomen is soft.     Tenderness: There is abdominal tenderness.  Musculoskeletal:     Right lower leg: No edema.     Left lower leg: No edema.  Neurological:     General: No focal deficit present.     Mental Status: She is alert and oriented to person, place, and time.    Family Communication: None  Primary team communication: Dr.Pickens,  Thank you very much for involving us  in the care of your patient.     Author: Mario LULLA Blanch, MD 12/05/2023 5:23 PM  For on call review [email or web address].

## 2023-12-05 NOTE — Progress Notes (Signed)
 Gynecology Progress Note  Admission Date: 12/04/2023 Current Date: 12/05/2023 9:03 AM  Amanda Paul is a 48 y.o. POD#1 (1800) s/p TAH/BSO for necrotic fibroid and uterine rupture with remote h/o [place] for fibroids in 2022  History complicated by: Patient Active Problem List   Diagnosis Date Noted   Fibroid 12/04/2023   Persistent cough 11/20/2023   Peripheral edema 11/20/2023   Chronic pain of both ankles 10/01/2023   Allergic rhinitis with postnasal drip 10/01/2023   Prediabetes 03/21/2023   Class 3 severe obesity due to excess calories with serious comorbidity and body mass index (BMI) of 45.0 to 49.9 in adult 03/21/2023   Pure hypercholesterolemia 03/21/2023   Coronary artery calcification 03/21/2023   Enlarged liver 03/21/2023   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 No nausea, vomiting, belching, flatus, ambulation.   Objective:    Current Vital Signs 24h Vital Sign Ranges  T 98.6 F (37 C) Temp  Avg: 98.7 F (37.1 C)  Min: 97.6 F (36.4 C)  Max: 100.7 F (38.2 C)  BP 120/64 BP  Min: 106/58  Max: 157/118  HR 87 Pulse  Avg: 98.7  Min: 85  Max: 145  RR 20 Resp  Avg: 33.7  Min: 20  Max: 52  SaO2 92 % Nasal Cannula SpO2  Avg: 93 %  Min: 90 %  Max: 98 %       24 Hour I/O Current Shift I/O  Time Ins Outs 08/24 0701 - 08/25 0700 In: 3603.7 [P.O.:120; I.V.:2003] Out: 5314  [Urine:1025; Drains:139] No intake/output data recorded.   Patient Vitals for the past 12 hrs:  BP Temp Temp src Pulse Resp SpO2  12/05/23 0755 -- -- -- -- -- 92 %  12/05/23 0750 -- -- -- -- -- 91 %  12/05/23 0746 120/64 98.6 F (37 C) Oral 87 20 --  12/05/23 0700 -- -- -- -- -- 94 %  12/05/23 0600 -- -- -- -- -- 95 %  12/05/23 0453 121/63 -- -- 85 (!) 22 92 %  12/05/23 0310 -- -- -- -- (!) 25 94 %  12/05/23 0100 -- -- -- -- -- 94 %  12/05/23 0000 -- -- -- -- -- 94 %  12/04/23 2356 118/65 97.8 F (36.6 C) Oral 87 (!) 28 --  12/04/23 2145 -- -- -- -- -- 93 %  12/04/23 2142 108/65 -- -- 88 (!) 32 --   Physical exam: General appearance: alert, cooperative, and appears stated age Abdomen: no bowel sounds, soft, nttp, non distended, c/d/I pressure dressing in place. JP drain with sero-sang fluid in it, approx 10mL in tube/bulb GU: No gross VB Lungs: clear to auscultation bilaterally, no respiratory distress Heart: S1, S2 normal, no murmur, rub or gallop, regular rate and rhythm Extremities: no lower extremity edema Skin: warm and dry Psych: appropriate Neurologic: Grossly normal  Medications Current Facility-Administered Medications  Medication Dose Route Frequency Provider Last Rate Last Admin   acetaminophen  (TYLENOL ) tablet 650 mg  650 mg Oral Q6H PRN Cleatus Moccasin, MD   650 mg at 12/04/23 1403   chewing gum (ORBIT) sugar free  1 Stick Oral QID Cleatus Moccasin, MD   1 Stick at 12/05/23 0454   diphenhydrAMINE  (BENADRYL ) injection 25 mg  25 mg Intravenous Q6H PRN Cleatus Moccasin, MD       famotidine  (PEPCID ) IVPB 20 mg premix  20 mg Intravenous Q12H Cleatus Moccasin, MD 100 mL/hr at 12/04/23 2159 20 mg at 12/04/23 2159   HYDROmorphone  (DILAUDID ) injection 0.2-0.6 mg  0.2-0.6 mg Intravenous Q2H PRN Izell Harari, MD       ketorolac  (TORADOL ) 30 MG/ML injection 30 mg  30 mg Intravenous Q6H PRN Cleatus Moccasin, MD   30 mg at 12/05/23 0453   ondansetron  (ZOFRAN ) tablet 4 mg  4 mg Oral Q6H  PRN Cleatus Moccasin, MD       Or   ondansetron  (ZOFRAN ) injection 4 mg  4 mg Intravenous Q6H PRN Cleatus Moccasin, MD       oxyCODONE  (Oxy IR/ROXICODONE ) immediate release tablet 5-10 mg  5-10 mg Oral Q4H PRN Cleatus Moccasin, MD       polyethylene glycol (MIRALAX  / GLYCOLAX ) packet 17 g  17 g Oral Daily PRN Cleatus Moccasin, MD       prenatal multivitamin tablet 1 tablet  1 tablet Oral Q1200 Cleatus Moccasin, MD       simethicone  (MYLICON) chewable tablet 80 mg  80 mg Oral TID Izell Harari, MD          Labs  Blood cultures: pending Fluid culture: no growth to date or organisms seen, +rare WBC, pre-dominantly PMNs  Recent Labs  Lab 12/04/23 0927 12/04/23 1454 12/05/23 0420  WBC 8.4 3.9* 10.5  HGB 10.5* 11.8* 9.4*  HCT 35.9* 39.0 30.7*  PLT 467* 479*  472* 359    Recent Labs  Lab 12/04/23 0927 12/05/23 0420  NA 139 137  K 3.6 3.7  CL 101 105  CO2 26 26  BUN 13 11  CREATININE 0.70 0.52  CALCIUM  8.9 8.1*  PROT 8.7* 6.2*  BILITOT 0.7 0.4  ALKPHOS 62 38  ALT 16 13  AST 21 14*  GLUCOSE 169* 122*   Radiology No new imaging  Assessment & Plan:  Patient stable *GYN: d/c foley later today *HTN: no current issues. No events on cardiac monitoring. Potential d/c later today.  *Pulmonary: try and wean O2 to room air. pCXR ordered. IS encouraged to patient. Patient never been told any concern for OSA in the past *BMI 40s: PT consulted. Patient works at [employer] in [place]  *Pain: PO and IV PRNs *FEN/GI: ADAT to regular. PPI *PPx: SCDs, OOB, lovenox  to start tonight *Dispo: likely POD#3-4.   Code Status: Full Code  Total time taking care of the patient was 30 minutes, with greater than 50% of the time spent in face to face interaction with the patient.  Harari Izell Overcast MD Attending Center for [facility])

## 2023-12-05 NOTE — Progress Notes (Signed)
 PHARMACY - ANTICOAGULATION CONSULT NOTE  Pharmacy Consult for Enoxaparin  prophylaxis Indication: Medical admission  Allergies  Allergen Reactions   Amoxicillin Hives, Itching and Swelling    Swollen eyes and throat.   Doxycycline  Hyclate Swelling    Bilateral lower leg swelling   Penicillins Hives and Swelling    Patient Measurements:    Vital Signs: Temp: 98.6 F (37 C) (08/25 0746) Temp Source: Oral (08/25 0746) BP: 120/64 (08/25 0746) Pulse Rate: 87 (08/25 0746)  Labs: Recent Labs    12/04/23 0927 12/04/23 1454 12/05/23 0420  HGB 10.5* 11.8* 9.4*  HCT 35.9* 39.0 30.7*  PLT 467* 479*  472* 359  APTT  --  31  --   LABPROT  --  16.2*  --   INR  --  1.2  --   CREATININE 0.70  --  0.52    Estimated Creatinine Clearance: 116.3 mL/min (by C-G formula based on SCr of 0.52 mg/dL).   Medical History: Past Medical History:  Diagnosis Date   Allergy    Arthritis    Hernia, umbilical    Hyperlipidemia    Hypertension    Morbid obesity (HCC)    Uterine fibroids     Assessment: 48yo female transferred from [facility] for abdominal pain secondary to known uterine fibroids. Emergent TAH/BSO performed on 8/24 PM due to acute pain. Pharmacy consulted for post-op management of lovenox  for DVT ppx int he setting of BMI > 30. Hgb expectedly down from 11.8 to 9.4 post-op, plt wnl.   Goal of Therapy:  Prevent of development of VTE while mobility is limited post-op Monitor platelets by anticoagulation protocol: Yes   Plan:  Lovenox  60mg  (0.5 mg/kg) Q24h Monitor CBC and for s/s of bleeding Pharmacy will sign off. Please re-consult for any additional needs.   Jennine Peddy 12/05/2023,9:32 AM

## 2023-12-05 NOTE — Progress Notes (Signed)
 Pharmacy Antibiotic Note  Amanda Paul is a 48 y.o. female admitted on 12/04/2023 for uterine rupture of necrotic fibroid leading to emergent TAH/BSO. Patient now presenting with new onset of acute respiratory failure with hypoxia. Pharmacy has been consulted for Cefepime  and Vancomycin  dosing.  Plan:  Cefepime  2gm IV Q8H  Vancomycin  2500mg  IV x 1; then 750mg  IV q8h. Goal AUC 400-550  Est AUC:501 Est Cmax: 42.1 Est Cmin: 9.9 Calculated with SCr 0.8  Vancomycin  levels will be ordered if patient will continue therapy beyond 48 hours.  Pharmacist will follow renal function, culture results, and clinical course.   Temp (24hrs), Avg:99 F (37.2 C), Min:97.8 F (36.6 C), Max:99.9 F (37.7 C)  Recent Labs  Lab 12/04/23 0927 12/04/23 1454 12/05/23 0420  WBC 8.4 3.9* 10.5  CREATININE 0.70  --  0.52  LATICACIDVEN  --  1.0  --     Estimated Creatinine Clearance: 116.3 mL/min (by C-G formula based on SCr of 0.52 mg/dL).    Allergies  Allergen Reactions   Amoxicillin Hives, Itching and Swelling    Swollen eyes and throat.   Doxycycline  Hyclate Swelling    Bilateral lower leg swelling   Penicillins Hives and Swelling     Microbiology results: 8/24 BCx: NGTD 8/24  Wound Cx: NGTD   Thank you for allowing pharmacy to be a part of this patient's care.  Amanda Paul 12/05/2023 8:54 PM

## 2023-12-05 NOTE — Evaluation (Addendum)
 Physical Therapy Evaluation Patient Details Name: Amanda Paul MRN: [id number] DOB: [birth year] Today's Date: 12/05/2023  History of Present Illness  Pt is 48 year old presented to [facility] on  12/04/23 for acute abd pain. Pt transferred to [facility] with uterine rupture of necrotic fibroid with chemical peritonitis. Pt underwent emergent total abdominal hysterectomy and bil salpingo-oopherectomy. PMH - uterine fibroids, htn, obesity,  Clinical Impression  Pt presents with decr mobility after emergent TAH. Prior pt independent, lives alone, and works. Expect she will make steady progress. Likely will need several more days before she is moving well enough to return home. Doesn't have in home support so will need to be mostly independent.         If plan is discharge home, recommend the following: Assist for transportation;Assistance with cooking/housework   Can travel by private Tax inspector (2 wheels)  Recommendations for Other Services  OT consult    Functional Status Assessment Patient has had a recent decline in their functional status and demonstrates the ability to make significant improvements in function in a reasonable and predictable amount of time.     Precautions / Restrictions Precautions Precautions: Fall Restrictions Weight Bearing Restrictions Per Provider Order: No      Mobility  Bed Mobility Overal bed mobility: Needs Assistance Bed Mobility: Supine to Sit     Supine to sit: Min assist, HOB elevated, Used rails     General bed mobility comments: Assist to elevate trunk into sitting    Transfers Overall transfer level: Needs assistance Equipment used: Rolling walker (2 wheels) Transfers: Sit to/from Stand Sit to Stand: Min assist           General transfer comment: Assist to power up    Ambulation/Gait Ambulation/Gait assistance: Contact guard assist Gait Distance (Feet): 100 Feet Assistive  device: Rolling walker (2 wheels) Gait Pattern/deviations: Step-through pattern, Decreased stride length Gait velocity: decr Gait velocity interpretation: <1.31 ft/sec, indicative of household ambulator   General Gait Details: Assist for safety  Stairs            Wheelchair Mobility     Tilt Bed    Modified Rankin (Stroke Patients Only)       Balance Overall balance assessment: Needs assistance Sitting-balance support: No upper extremity supported, Feet supported Sitting balance-Leahy Scale: Good     Standing balance support: No upper extremity supported, During functional activity Standing balance-Leahy Scale: Fair                               Pertinent Vitals/Pain Pain Assessment Pain Assessment: Faces Faces Pain Scale: Hurts even more Pain Location: abdomen with mobility Pain Descriptors / Indicators: Grimacing, Guarding Pain Intervention(s): Limited activity within patient's tolerance, Monitored during session, Repositioned    Home Living Family/patient expects to be discharged to:: Private residence Living Arrangements: Alone   Type of Home: Apartment Home Access: Stairs to enter Entrance Stairs-Rails: Doctor, general practice of Steps: 4-5   Home Layout: One level Home Equipment: None      Prior Function Prior Level of Function : Independent/Modified Independent;Driving;Working/employed             Mobility Comments: No assistive device. Works at [employer]       Extremity/Trunk Assessment   Upper Extremity Assessment Upper Extremity Assessment: Defer to OT evaluation    Lower Extremity Assessment Lower Extremity Assessment: Generalized weakness  Communication   Communication Communication: No apparent difficulties    Cognition Arousal: Alert Behavior During Therapy: WFL for tasks assessed/performed   PT - Cognitive impairments: No apparent impairments                          Following commands: Intact       Cueing Cueing Techniques: Verbal cues, Tactile cues     General Comments General comments (skin integrity, edema, etc.): Pt on 2L with SpO2 91%. Removed O2 and SpO2 dropped to 83%. Replace O2 at 2L with SpO2 back to 90%.    Exercises     Assessment/Plan    PT Assessment Patient needs continued PT services  PT Problem List Decreased strength;Decreased mobility;Decreased balance;Pain       PT Treatment Interventions DME instruction;Gait training;Stair training;Functional mobility training;Therapeutic activities;Therapeutic exercise;Neuromuscular re-education;Patient/family education    PT Goals (Current goals can be found in the Care Plan section)  Acute Rehab PT Goals Patient Stated Goal: return to prior level of function PT Goal Formulation: With patient Time For Goal Achievement: 12/19/23 Potential to Achieve Goals: Good    Frequency Min 3X/week     Co-evaluation               AM-PAC PT 6 Clicks Mobility  Outcome Measure Help needed turning from your back to your side while in a flat bed without using bedrails?: A Little Help needed moving from lying on your back to sitting on the side of a flat bed without using bedrails?: A Little Help needed moving to and from a bed to a chair (including a wheelchair)?: A Little Help needed standing up from a chair using your arms (e.g., wheelchair or bedside chair)?: A Little Help needed to walk in hospital room?: A Little Help needed climbing 3-5 steps with a railing? : A Little 6 Click Score: 18    End of Session Equipment Utilized During Treatment: Oxygen Activity Tolerance: Patient tolerated treatment well Patient left: in chair;with call bell/phone within reach Nurse Communication: Mobility status PT Visit Diagnosis: Unsteadiness on feet (R26.81);Other abnormalities of gait and mobility (R26.89);Muscle weakness (generalized) (M62.81);Pain Pain - part of body:  (abdomen)    Time:  8765-8740 PT Time Calculation (min) (ACUTE ONLY): 25 min   Charges:   PT Evaluation $PT Eval Moderate Complexity: 1 Mod PT Treatments $Gait Training: 8-22 mins PT General Charges $$ ACUTE PT VISIT: 1 Visit         [facility] PT Acute Rehabilitation Services Office [phone]   Rodgers ORN [facility] 12/05/2023, 3:20 PM

## 2023-12-05 NOTE — Consult Note (Signed)
 Consult addendum for A/P: Procalcitonin is elevated and with CTA concern for possible PNA we will proceed with abx fro HCAP.  Today's Vitals   12/05/23 1739 12/05/23 1931 12/05/23 1934 12/05/23 1959  BP: 120/76 113/63    Pulse:  (!) 115    Resp:  19 (!) 35   Temp: 99.9 F (37.7 C) 99.5 F (37.5 C)    TempSrc: Oral Oral    SpO2: 90% 91%  92%  PainSc:   4     SpO2: 92 % O2 Flow Rate (L/min): 3 L/min   .Vancomycin  and cefepime  as pt is diabetic.

## 2023-12-05 NOTE — Progress Notes (Signed)
 Gynecology Progress Note  Admission Date: 12/04/2023 Current Date: 12/05/2023 3:49 PM  Shrinika Mechette Paul is a 48 y.o. G0P0000 POD1 s/p TAH/BSO for necrotic fibroid and uterine rupture remote from [place] in 2022.   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 Her O2 sats are in the lows 90s. She reports her pain is improved following her surgery yesterday as compared to before.   Objective:    Current Vital Signs 24h Vital Sign Ranges  T 98.6 F (37 C) Temp  Avg: 98 F (36.7 C)  Min: 97.6 F (36.4 C)  Max: 98.6 F (37 C)  BP 120/64 BP  Min: 106/58  Max: 121/63  HR 87 Pulse  Avg: 86.6  Min: 85  Max: 90  RR 20 Resp  Avg: 29.4  Min: 20  Max: 39  SaO2 92 % Nasal Cannula SpO2  Avg: 93.7 %  Min: 91 %  Max: 98 %       24 Hour I/O Current Shift I/O  Time Ins Outs 08/24 0701 - 08/25 0700 In: 3603.7 [P.O.:120; I.V.:2003] Out: 5314 [Urine:1025; Drains:139] 08/25 0701 - 08/25 1900 In: -  Out: 550 [Urine:500; Drains:50]     Patient Vitals for the past 24 hrs:  BP Temp Temp src Pulse Resp  SpO2  12/05/23 0755 -- -- -- -- -- 92 %  12/05/23 0750 -- -- -- -- -- 91 %  12/05/23 0746 120/64 98.6 F (37 C) Oral 87 20 --  12/05/23 0700 -- -- -- -- -- 94 %  12/05/23 0600 -- -- -- -- -- 95 %  12/05/23 0453 121/63 -- -- 85 (!) 22 92 %  12/05/23 0310 -- -- -- -- (!) 25 94 %  12/05/23 0100 -- -- -- -- -- 94 %  12/05/23 0000 -- -- -- -- -- 94 %  12/04/23 2356 118/65 97.8 F (36.6 C) Oral 87 (!) 28 --  12/04/23 2145 -- -- -- -- -- 93 %  12/04/23 2142 108/65 -- -- 88 (!) 32 --  12/04/23 2055 120/72 -- -- 90 (!) 30 95 %  12/04/23 2028 (!) 106/58 98.1 F (36.7 C) Oral 85 (!) 25 97 %  12/04/23 2000 106/66 98.1 F (36.7 C) -- 85 (!) 31 94 %  12/04/23 1945 114/77 -- -- 86 (!) 30 93 %  12/04/23 1930 116/77 -- -- 86 (!) 31 93 %  12/04/23 1915 116/77 -- -- 86 (!) 30 93 %  12/04/23 1900 112/78 -- -- 85 (!) 31 92 %  12/04/23 1845 109/77 -- -- 89 (!) 39 92 %  12/04/23 1833 119/68 97.6 F (  36.4 C) -- 87 (!) 37 98 %    Physical exam: General appearance: alert, cooperative, and appears stated age Abdomen: soft, nttp, non-distended, c/d/I incision with honeycomb in place. JP with 25 cc of sero-sang fluid over the last 1.5 hours.  GU: No gross VB Lungs: clear to auscultation bilaterally Heart: S1, S2 normal, no murmur, rub or gallop, regular rate and rhythm Extremities: LE edema stable, non-pitting, SCDs on Skin: warm/dry Psych: appropriate Neurologic: Grossly normal  Labs  Recent Labs  Lab 12/04/23 0927 12/04/23 1454 12/05/23 0420  WBC 8.4 3.9* 10.5  HGB 10.5* 11.8* 9.4*  HCT 35.9* 39.0 30.7*  PLT 467* 479*  472* 359    Recent Labs  Lab 12/04/23 0927 12/05/23 0420  NA 139 137  K 3.6 3.7  CL 101 105  CO2 26 26  BUN 13 11  CREATININE 0.70 0.52  CALCIUM  8.9 8.1*  PROT 8.7* 6.2*  BILITOT 0.7 0.4  ALKPHOS 62 38  ALT 16 13  AST 21 14*  GLUCOSE 169* 122*    Assessment & Plan:  Patient status improving.  *GYN: d/c foley later today once up and moving. UOP excellent.   *Cardiac: on tele, no meds for HTN at this time.  *Pulm: pCXR showed borderline cardiomegaly, possible PNA (clinically does not appear to have PNA). Discussed importance of I/S with pt - reviewed multiple risk factors for atelectasis and subsequent risk of PNA though. Await hospitalist input.  *Pain: Well controlled. May do binder.  *FEN/GI: Diet as tolerated. Chewing gum. PPI ordered *PPx: Lovenox  tonight. Continue SCDs. Encourage ambulation.  *Dispo: likely POD3-4.   Code Status: Full Code  Vina Solian, MD Attending Center for [facility])

## 2023-12-05 NOTE — Progress Notes (Signed)
 GYN Note Patient seen with Hospitalist, Dr. Tobie. Patient getting labs now and plan for echo and IV lasix . JP now putting out large volume of yellowish d/c similar to what was seen at surgery as well as coming out of incision. D/w nursing that this will likely continue, which is fine, given 4L removed yesterday in OR and irrigation done afterwards  Bebe Izell Raddle MD Attending Center for [facility] (Faculty Practice) 12/05/2023 Time: [id number]

## 2023-12-05 NOTE — Progress Notes (Signed)
 Hospitalist consult placed due to supplemental oxygen ([place]) requirement and concern for PNA, borderline cardiomegaly on portable CXR. Patient currently clinically stable  Bebe Izell Raddle MD Attending Center for [employer] (Faculty Practice) 12/05/2023 Time: 1245pm

## 2023-12-06 ENCOUNTER — Encounter (HOSPITAL_COMMUNITY): Payer: Self-pay | Admitting: Obstetrics and Gynecology

## 2023-12-06 ENCOUNTER — Inpatient Hospital Stay (HOSPITAL_COMMUNITY)

## 2023-12-06 DIAGNOSIS — E1169 Type 2 diabetes mellitus with other specified complication: Secondary | ICD-10-CM

## 2023-12-06 DIAGNOSIS — J189 Pneumonia, unspecified organism: Secondary | ICD-10-CM | POA: Insufficient documentation

## 2023-12-06 DIAGNOSIS — J9601 Acute respiratory failure with hypoxia: Secondary | ICD-10-CM | POA: Diagnosis not present

## 2023-12-06 DIAGNOSIS — I509 Heart failure, unspecified: Secondary | ICD-10-CM

## 2023-12-06 DIAGNOSIS — A419 Sepsis, unspecified organism: Secondary | ICD-10-CM | POA: Diagnosis not present

## 2023-12-06 DIAGNOSIS — I5033 Acute on chronic diastolic (congestive) heart failure: Secondary | ICD-10-CM | POA: Insufficient documentation

## 2023-12-06 DIAGNOSIS — D219 Benign neoplasm of connective and other soft tissue, unspecified: Secondary | ICD-10-CM | POA: Diagnosis not present

## 2023-12-06 DIAGNOSIS — E119 Type 2 diabetes mellitus without complications: Secondary | ICD-10-CM

## 2023-12-06 LAB — ECHOCARDIOGRAM COMPLETE
AR max vel: 2.86 cm2
AV Area VTI: 3.23 cm2
AV Area mean vel: 2.81 cm2
AV Mean grad: 6 mmHg
AV Peak grad: 12.5 mmHg
Ao pk vel: 1.77 m/s
Area-P 1/2: 4.49 cm2
Calc EF: 62.1 %
Est EF: 55
S' Lateral: 3.6 cm
Single Plane A2C EF: 60.2 %
Single Plane A4C EF: 60.1 %

## 2023-12-06 LAB — COMPREHENSIVE METABOLIC PANEL WITH GFR
ALT: 12 U/L (ref 0–44)
AST: 13 U/L — ABNORMAL LOW (ref 15–41)
Albumin: 1.7 g/dL — ABNORMAL LOW (ref 3.5–5.0)
Alkaline Phosphatase: 62 U/L (ref 38–126)
Anion gap: 8 (ref 5–15)
BUN: 10 mg/dL (ref 6–20)
CO2: 27 mmol/L (ref 22–32)
Calcium: 8.2 mg/dL — ABNORMAL LOW (ref 8.9–10.3)
Chloride: 102 mmol/L (ref 98–111)
Creatinine, Ser: 0.58 mg/dL (ref 0.44–1.00)
GFR, Estimated: >60 mL/min (ref >60)
Glucose, Bld: 87 mg/dL (ref 70–99)
Potassium: 3.6 mmol/L (ref 3.5–5.1)
Sodium: 137 mmol/L (ref 135–145)
Total Bilirubin: 0.5 mg/dL (ref 0.0–1.2)
Total Protein: 5.8 g/dL — ABNORMAL LOW (ref 6.5–8.1)

## 2023-12-06 LAB — GLUCOSE, CAPILLARY
Glucose-Capillary: 96 mg/dL (ref 70–99)
Glucose-Capillary: 107 mg/dL — ABNORMAL HIGH (ref 70–99)
Glucose-Capillary: 118 mg/dL — ABNORMAL HIGH (ref 70–99)

## 2023-12-06 LAB — CBC
HCT: 28.9 % — ABNORMAL LOW (ref 36.0–46.0)
Hemoglobin: 8.8 g/dL — ABNORMAL LOW (ref 12.0–15.0)
MCH: 25.4 pg — ABNORMAL LOW (ref 26.0–34.0)
MCHC: 30.4 g/dL (ref 30.0–36.0)
MCV: 83.3 fL (ref 80.0–100.0)
Platelets: 371 K/uL (ref 150–400)
RBC: 3.47 MIL/uL — ABNORMAL LOW (ref 3.87–5.11)
RDW: 16.5 % — ABNORMAL HIGH (ref 11.5–15.5)
WBC: 16.8 K/uL — ABNORMAL HIGH (ref 4.0–10.5)
nRBC: 0 % (ref 0.0–0.2)

## 2023-12-06 LAB — MICROALBUMIN / CREATININE URINE RATIO
Creatinine, Urine: 64 mg/dL
Microalb Creat Ratio: 30 mg/g{creat} — ABNORMAL HIGH (ref 0–29)
Microalb, Ur: 19.3 ug/mL — ABNORMAL HIGH

## 2023-12-06 LAB — BRAIN NATRIURETIC PEPTIDE: B Natriuretic Peptide: 214.6 pg/mL — ABNORMAL HIGH (ref 0.0–100.0)

## 2023-12-06 MED ORDER — FUROSEMIDE 10 MG/ML IJ SOLN
60.0000 mg | Freq: Three times a day (TID) | INTRAMUSCULAR | Status: AC
  Administered 2023-12-06: 60 mg via INTRAVENOUS
  Filled 2023-12-06: qty 6

## 2023-12-06 MED ORDER — CHLORHEXIDINE GLUCONATE CLOTH 2 % EX PADS
6.0000 | MEDICATED_PAD | Freq: Every day | CUTANEOUS | Status: DC
  Administered 2023-12-06 – 2023-12-16 (×10): 6 via TOPICAL

## 2023-12-06 NOTE — Plan of Care (Signed)
   Problem: Education: Goal: Knowledge of General Education information will improve Description: Including pain rating scale, medication(s)/side effects and non-pharmacologic comfort measures Outcome: Progressing   Problem: Activity: Goal: Risk for activity intolerance will decrease Outcome: Progressing   Problem: Nutrition: Goal: Adequate nutrition will be maintained Outcome: Progressing

## 2023-12-06 NOTE — Evaluation (Signed)
 Occupational Therapy Evaluation Patient Details Name: Amanda Paul MRN: [id number] DOB: February 05, 1976 Today's Date: 12/06/2023   History of Present Illness   Pt is 48 year old presented to [facility] on  12/04/23 for acute abd pain. Pt transferred to [facility] with uterine rupture of necrotic fibroid with chemical peritonitis. Pt underwent emergent total abdominal hysterectomy and bil salpingo-oopherectomy. PMH - uterine fibroids, htn, obesity,     Clinical Impressions Pt resting comfortably in bed, minimal pain. Pt lives alone, 4 STE, PLOF ind. Pt currently set up for UB ADLs, mod A with adaptive equipment with LB ADLs, supervision from elevated surface for transfers, able to ambulate using IV pole, increased time to recliner. Pt able to perform supine to sit with HOB elevated and rails at supervision, would have difficulty from flat bed. Pt would benefit from continued acute OT to maximize functional independence, will trial RW vs Rollator for return home, HHOT follow up recommended.      If plan is discharge home, recommend the following:   A little help with walking and/or transfers;A little help with bathing/dressing/bathroom;Assist for transportation;Help with stairs or ramp for entrance     Functional Status Assessment   Patient has had a recent decline in their functional status and demonstrates the ability to make significant improvements in function in a reasonable and predictable amount of time.     Equipment Recommendations   Other (comment) (RW vs Rollator?)     Recommendations for Other Services         Precautions/Restrictions   Precautions Precautions: Fall Recall of Precautions/Restrictions: Intact Restrictions Weight Bearing Restrictions Per Provider Order: No     Mobility Bed Mobility Overal bed mobility: Needs Assistance Bed Mobility: Supine to Sit     Supine to sit: Supervision, HOB elevated, Used rails     General bed mobility comments:  supervision, HOB elevated, used rails, would have difficulty sitting up from flat bed    Transfers Overall transfer level: Needs assistance Equipment used: 1 person hand held assist Transfers: Sit to/from Stand, Bed to chair/wheelchair/BSC Sit to Stand: Supervision     Step pivot transfers: Supervision     General transfer comment: supervision from elevated surface, used IV pole for 10 feet ambulation      Balance Overall balance assessment: Needs assistance Sitting-balance support: No upper extremity supported, Feet supported Sitting balance-Leahy Scale: Good     Standing balance support: Single extremity supported, During functional activity Standing balance-Leahy Scale: Fair Standing balance comment: able to stand with one hand supported                           ADL either performed or assessed with clinical judgement   ADL Overall ADL's : Needs assistance/impaired Eating/Feeding: Independent   Grooming: Set up;Supervision/safety;Standing   Upper Body Bathing: Set up;With adaptive equipment;Sitting   Lower Body Bathing: Moderate assistance;With adaptive equipment;Sitting/lateral leans;Sit to/from stand   Upper Body Dressing : Set up;Supervision/safety;Sitting   Lower Body Dressing: Moderate assistance;With adaptive equipment;Sitting/lateral leans;Sit to/from stand   Toilet Transfer: Supervision/safety;Rolling walker (2 wheels)           Functional mobility during ADLs: Supervision/safety;Rolling walker (2 wheels) General ADL Comments: Pt requires increased time for activities, difficulty with LB ADLs, would do well with adaptive equipement, will try sock aide with dressing stikc and reacher next session. Pt with good BUE strength and ROM.     Vision Baseline Vision/History: 1 Wears glasses Ability to See in  Adequate Light: 0 Adequate Patient Visual Report: No change from baseline       Perception         Praxis         Pertinent  Vitals/Pain Pain Assessment Pain Assessment: 0-10 Pain Score: 2  Pain Location: abdomen with mobility Pain Descriptors / Indicators: Grimacing, Guarding Pain Intervention(s): Monitored during session     Extremity/Trunk Assessment             Communication Communication Communication: No apparent difficulties   Cognition Arousal: Alert Behavior During Therapy: WFL for tasks assessed/performed Cognition: No apparent impairments                               Following commands: Intact       Cueing  General Comments   Cueing Techniques: Verbal cues;Tactile cues  2L O2 maintained 92%, on RA Pt desats to 70s   Exercises     Shoulder Instructions      Home Living Family/patient expects to be discharged to:: Private residence Living Arrangements: Alone Available Help at Discharge: Family;Friend(s);Available PRN/intermittently Type of Home: Apartment Home Access: Stairs to enter Entrance Stairs-Number of Steps: 4-5 Entrance Stairs-Rails: Right;Left Home Layout: One level     Bathroom Shower/Tub: Tub/shower unit         Home Equipment: None   Additional Comments: Pt lives alone, has friends who can check on her occasionally      Prior Functioning/Environment Prior Level of Function : Independent/Modified Independent;Driving;Working/employed             Mobility Comments: No assistive device. Works at [employer] Problem List: Decreased strength;Decreased range of motion;Decreased activity tolerance;Impaired balance (sitting and/or standing);Pain;Obesity   OT Treatment/Interventions: Self-care/ADL training;Therapeutic exercise;Energy conservation;DME and/or AE instruction;Therapeutic activities;Patient/family education;Balance training      OT Goals(Current goals can be found in the care plan section)   Acute Rehab OT Goals Patient Stated Goal: to return home OT Goal Formulation: With patient Time For Goal Achievement:  12/20/23 Potential to Achieve Goals: Good   OT Frequency:  Min 2X/week    Co-evaluation              AM-PAC OT 6 Clicks Daily Activity     Outcome Measure Help from another person eating meals?: None Help from another person taking care of personal grooming?: A Little Help from another person toileting, which includes using toliet, bedpan, or urinal?: A Little Help from another person bathing (including washing, rinsing, drying)?: A Lot Help from another person to put on and taking off regular upper body clothing?: A Little Help from another person to put on and taking off regular lower body clothing?: A Lot 6 Click Score: 17   End of Session Equipment Utilized During Treatment: Gait belt Nurse Communication: Mobility status  Activity Tolerance: Patient tolerated treatment well Patient left: in chair;with call bell/phone within reach  OT Visit Diagnosis: Unsteadiness on feet (R26.81);Other abnormalities of gait and mobility (R26.89);Pain Pain - part of body:  (abdomen)                Time: 8876-8841 OT Time Calculation (min): 35 min Charges:  OT General Charges $OT Visit: 1 Visit OT Evaluation $OT Eval Low Complexity: 1 Low OT Treatments $Self Care/Home Management : 8-22 mins  [address] OTR/L   Elouise JONELLE Bott 12/06/2023, 12:04 PM

## 2023-12-06 NOTE — Progress Notes (Signed)
 S: CTSP  regarding tachycardia and tachypnea on 4L O2.  Pt was actually seen an hour previous for the same symptoms.  Pt was asymptomatic and denied SOB and chest pain.  Pain was well controlled at the time.  Pt was advised to use pain control before it is at full intensity and to use incentive spirometry as much as possible.  Returned to see patient and she was the same clinically other than appearing more diaphoretic.  She again denied chest pain and SOB.  Nursing staff was concerned regarding possible infarction on EKG.  Sinus tachycardia noted on EKG.  On call hospitalist called to reassess patient.  Hospitalist advised transfer to higher acuity floor for Bipap.    O:  Vitals:   12/05/23 2253 12/05/23 2319  BP: (!) 105/58 (!) 104/57  Pulse: (!) 115 (!) 110  Resp: (!) 40 (!) 30  Temp: 99.8 F (37.7 C) (!) 101.5 F (38.6 C)  SpO2:  96%    CV: tachycardic but regular rate Pulm:  no obvious crackles or rhonchi, possible decreased air movement bilaterally Abd: appropriately tender, mild tympany   A/P:  will check h/h and BNP, hospitalist contacted and will transfer to PCU. H/h: stable BNP: same to improved  Will check on patient in PCU a few hours after transfer. Echo pending.

## 2023-12-06 NOTE — Progress Notes (Signed)
 SATURATION QUALIFICATIONS: (This note is used to comply with regulatory documentation for home oxygen)  Patient Saturations on Room Air at Rest = 86%  Patient Saturations on Room Air while Ambulating = N/A  Patient Saturations on 2 Liters of oxygen while Ambulating = 91% and above  Please briefly explain why patient needs home oxygen: Pt hypoxic on room air at rest.

## 2023-12-06 NOTE — Hospital Course (Signed)
 48 y.o. female with past medical history of obesity, essential hypertension, uterine fibroids, allergy, admitted on OB service for hysterectomy for necrotic fibroid.  Patient postprocedure developed hypoxia requiring 4 L nasal cannula initially along with pleural effusions.  Consult requested for evaluation   Assessment and Plan:   Concern for sepsis - Fever, leukocytosis, tachycardia, tachypnea, will give concern for sepsis.  Fluid bolus, empiric antibiotics, blood cultures are drawn.  Monitor blood pressure closely.   Acute hypoxic respiratory failure - Transition to progressive for BiPAP secondary to tachycardia, dyspnea, tachypnea, hypoxia.  Initially requiring 4 L nasal cannula.  Weaned down to 3 L this morning.  Dyspnea and tachypnea resolved patient feeling improved.  Continue wean O2 as tolerated.   Concern for CHF-unknown ejection fraction - Chest x-ray personally reviewed showing congestion small effusion.  proBNP greater than 200.  Initiated on low-dose Lasix .  Will increase Lasix  60 mg every 8 hours for now.  Diuresis going well.  Urine output excellent.  Echo performed with results pending.  May warrant cardiology consult pending echo results.   Concern for hospital-acquired pneumonia - Fever, hypoxia, given concern for pneumonia.  Patient denying any purulent sputum.  Continues on empiric cefepime  plus vancomycin .   Diabetes mellitus - A1c 6.9.  Currently on sliding scale.   Necrotic uterine fibroid - POD #2 hysterectomy/BSO.  Reviewed, followed closely by OB/Gyn.  JP drain in place.  Pain medications on board.

## 2023-12-06 NOTE — Progress Notes (Addendum)
 Physical Therapy Treatment Patient Details Name: Amanda Paul MRN: [id number] DOB: 05-01-1975 Today's Date: 12/06/2023   History of Present Illness Pt is 48 year old presented to [facility] on  12/04/23 for acute abd pain. Pt transferred to [facility] with uterine rupture of necrotic fibroid with chemical peritonitis. Pt underwent emergent total abdominal hysterectomy and bil salpingo-oopherectomy. PMH - uterine fibroids, htn, obesity,    PT Comments  Pt received in chair, pleasantly agreeable to therapy session and with good participation and tolerance for transfer and gait training. Pt reliant on head of bed fully elevated for return to supine, despite discussion on log roll to simulate flat bed, pt defers due to discomfort. 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 Pt gait speed <0.4 m/s indicating increased fall risk for community dwelling adults and currently reliant on 2L O2 [place] to maintain SpO2 WFL. Pt continues to benefit from PT services to progress toward functional mobility goals, continue to recommend HHPT once pt able to perform stairs and ambulate and transfer unassisted, as typically she lives alone. DME updated below per discussion with patient and supervision PT Logan B per pt progress.   If plan is discharge home, recommend the following: Assist for transportation;Assistance with cooking/housework;A little help with walking and/or transfers;Help with stairs or ramp for entrance   Can travel by private vehicle        Equipment Recommendations  Rollator (4 wheels);Hospital bed (hosp bed pending progress)    Recommendations for Other Services       Precautions / Restrictions Precautions Precautions: Fall;Other (comment) Recall of Precautions/Restrictions: Intact Precaution/Restrictions Comments: abd protection precs Required Braces or Orthoses: Other Brace Other Brace: abd binder  for comfort, pt assisted to don 8/26 Restrictions Weight Bearing Restrictions Per Provider Order: No     Mobility  Bed Mobility Overal bed mobility: Needs Assistance Bed Mobility: Sit to Supine       Sit to supine: Supervision, HOB elevated, Used rails   General bed mobility comments: Heavy reliance on bed rails and HOB fully elevated per pt request. Pt encouraged to try from flat bed via log roll with visual/verbal initial demo but pt defers, requesting to perform via her preferred method. Discussion on renting a hospital bed post-acute if needed if going home with HHPT.    Transfers Overall transfer level: Needs assistance Equipment used: 1 person hand held assist Transfers: Sit to/from Stand, Bed to chair/wheelchair/BSC Sit to Stand: Supervision   Step pivot transfers: Supervision       General transfer comment: supervision from elevated surface, used IV pole for 10 feet ambulation    Ambulation/Gait Ambulation/Gait assistance: Supervision, Contact guard assist Gait Distance (Feet): 125 Feet Assistive device: Rollator (4 wheels) Gait Pattern/deviations: Step-through pattern, Decreased stride length Gait velocity: decr Gait velocity interpretation: <1.8 ft/sec, indicate of risk for recurrent falls   General Gait Details: CGA progressing to supervision, cues for activity pacing/pursed-lip breathing. SpO2 WFL on 2L O2 , HR to ~117 bpm wtih exertion.   Stairs Stairs:  (pt defer due to pain)           Wheelchair Mobility     Tilt Bed    Modified Rankin (Stroke Patients Only)       Balance Overall balance assessment: Needs assistance Sitting-balance support: No upper extremity supported, Feet supported Sitting balance-Leahy Scale: Good     Standing balance support: During functional activity, No upper extremity  supported, Reliant on assistive device for balance Standing balance-Leahy Scale: Fair Standing balance comment: able to stand with one hand  supported but more stable with BUE support                            Communication Communication Communication: No apparent difficulties  Cognition Arousal: Alert Behavior During Therapy: WFL for tasks assessed/performed   PT - Cognitive impairments: No apparent impairments                         Following commands: Intact      Cueing Cueing Techniques: Verbal cues, Gestural cues  Exercises      General Comments        Pertinent Vitals/Pain Pain Assessment Pain Assessment: 0-10 Pain Score: 4  Pain Location: abdomen with mobility, esp STS to/from lower surface heights Pain Descriptors / Indicators: Grimacing, Guarding    Home Living                          Prior Function            PT Goals (current goals can now be found in the care plan section) Acute Rehab PT Goals Patient Stated Goal: To return to prior level of function PT Goal Formulation: With patient Time For Goal Achievement: 12/19/23 Progress towards PT goals: Progressing toward goals    Frequency    Min 3X/week      PT Plan      Co-evaluation              AM-PAC PT 6 Clicks Mobility   Outcome Measure  Help needed turning from your back to your side while in a flat bed without using bedrails?: A Little Help needed moving from lying on your back to sitting on the side of a flat bed without using bedrails?: A Little Help needed moving to and from a bed to a chair (including a wheelchair)?: A Little Help needed standing up from a chair using your arms (e.g., wheelchair or bedside chair)?: A Little Help needed to walk in hospital room?: A Little Help needed climbing 3-5 steps with a railing? : A Little 6 Click Score: 18    End of Session Equipment Utilized During Treatment: Oxygen;Other (comment) (abdominal binder) Activity Tolerance: Patient tolerated treatment well Patient left: in bed;with call bell/phone within reach;with bed alarm set;Other  (comment) (abd binder intact per patient request in supine) Nurse Communication: Mobility status;Other (comment) (pt IV beeping) PT Visit Diagnosis: Unsteadiness on feet (R26.81);Other abnormalities of gait and mobility (R26.89);Muscle weakness (generalized) (M62.81);Pain Pain - part of body:  (abdomen)     Time: 8554-8475 PT Time Calculation (min) (ACUTE ONLY): 39 min  Charges:    $Gait Training: 8-22 mins $Therapeutic Activity: 23-37 mins PT General Charges $$ ACUTE PT VISIT: 1 Visit                     Denver Harder P., PTA Acute Rehabilitation Services Secure Chat Preferred 9a-5:30pm Office: [phone]    Amanda Paul 12/06/2023, 3:48 PM

## 2023-12-06 NOTE — Progress Notes (Signed)
 Gynecology Progress Note  Admission Date: 12/04/2023 Current Date: 12/06/2023 7:11 AM  Amanda Paul is a 48 y.o. G0P0000 POD2 s/p TAH/BSO for necrotic fibroid and uterine rupture remote from [place] in 2022.   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 She was started on Vancomycin  and Cefepime  by the hospitalist team as well for concern for PNA.   Subjective:  She feels well this morning. She has no complaints. She denies N/V. She does not feel feverish. Denies flatus.   Objective:    Current Vital Signs 24h Vital Sign Ranges  T 99 F (37.2 C) Temp  Avg: 99.6 F (37.6 C)  Min: 98.6 F (37 C)  Max: 101.5 F (38.6 C)  BP 121/74 BP  Min: 104/57  Max: 121/74  HR 100 Pulse  Avg: 105.1  Min: 87  Max: 115  RR 20 Resp  Avg: 28.5  Min: 19  Max: 40  SaO2 97 % Nasal Cannula SpO2  Avg: 92.8 %  Min: 90 %  Max: 97 %       24 Hour I/O Current Shift I/O  Time Ins Outs 08/25 0701 - 08/26 0700 In: 625  Out: 1665 [Urine:1500; Drains:165] No intake/output data  recorded.     Patient Vitals for the past 24 hrs:  BP Temp Temp src Pulse Resp SpO2  12/06/23 0403 121/74 99 F (37.2 C) Oral 100 20 97 %  12/06/23 0247 117/72 98.9 F (37.2 C) Oral (!) 106 (!) 33 97 %  12/06/23 0143 117/65 99.2 F (37.3 C) Oral (!) 107 (!) 30 --  12/05/23 2319 (!) 104/57 (!) 101.5 F (38.6 C) Oral (!) 110 (!) 30 96 %  12/05/23 2253 (!) 105/58 99.8 F (37.7 C) Oral (!) 115 (!) 40 --  12/05/23 2207 -- -- -- -- -- 91 %  12/05/23 2119 -- -- -- -- (!) 36 94 %  12/05/23 1959 -- -- -- -- -- 92 %  12/05/23 1934 -- -- -- -- (!) 35 --  12/05/23 1931 113/63 99.5 F (37.5 C) Oral (!) 115 19 91 %  12/05/23 1739 120/76 99.9 F (37.7 C) Oral -- -- 90 %  12/05/23 1237 112/66 -- -- (!) 101 (!) 22 90 %  12/05/23 0755 -- -- -- -- -- 92 %  12/05/23 0750 -- -- -- -- -- 91 %  12/05/23 0746 120/64 98.6 F (37 C) Oral 87 20 --    Physical exam: General appearance: alert, cooperative, and appears stated age Abdomen: soft, nttp, non-distended, c/d/I incision with honeycomb in place. JP with 10 cc of sero-sang fluid GU: No  gross VB Lungs: Normal respiratory effort Heart: S1, S2 normal, no murmur, rub or gallop, regular rate and rhythm Extremities: LE edema stable, non-pitting, SCDs on Skin: warm/dry Psych: appropriate Neurologic: Grossly normal  Labs  Recent Labs  Lab 12/05/23 0420 12/05/23 2333 12/06/23 0416  WBC 10.5 17.0* 16.8*  HGB 9.4* 8.8* 8.8*  HCT 30.7* 29.3* 28.9*  PLT 359 361 371    Recent Labs  Lab 12/04/23 0927 12/05/23 0420 12/06/23 0416  NA 139 137 137  K 3.6 3.7 3.6  CL 101 105 102  CO2 26 26 27   BUN 13 11 10   CREATININE 0.70 0.52 0.58  CALCIUM  8.9 8.1* 8.2*  PROT 8.7* 6.2* 5.8*  BILITOT 0.7 0.4 0.5  ALKPHOS 62 38 62  ALT 16 13 12   AST 21 14* 13*  GLUCOSE 169* 122* 87    Assessment & Plan:  Patient status improving.  *GYN:  - d/c foley once respiratory status assured. UOP excellent.  - JP drain with minimal and normal output    *Cardiac:  - on tele - no meds for HTN at this time.  - Echo today. BNP elevated. Due to low albumin  from loss of 4L of fluids, will keep close on on fluids. Currently heplocked.  - EKG with c/f infarct but troponin wnl.   *ID: - Vanc/Cefepime  for presumed PNA - WBC starting to trend down from overnight. Currently at 16.8. Will follow daily. - Afebrile since ABX initiated.    *Heme: - Hgb stable at this time. If goes below 8 will consider transfusion.  - Will eventually start PO iron  every other day.   *Pulm:  - Increased O2 needs overnight but weaning back down and responded well likely to antibiotics. Vanc and cefepime  currently.   *Pain:  - Well controlled - using oxycodone  about every 8 hours.   *FEN/GI:  - Diet as tolerated. Encouraged high protein diet - Chewing gum. PPI.   *PPx:  - pLovenox. Continue SCDs. Encourage ambulation.   *Dispo: likely POD4.   Code Status: Full Code  Vina Solian, MD Attending Center for [facility])

## 2023-12-06 NOTE — Progress Notes (Signed)
 Progress Note   Patient: Amanda Paul FMW:[id number] DOB: Aug 22, 1975 DOA: 12/04/2023  DOS: the patient was seen and examined on 12/06/2023   Brief hospital course:  48 y.o. female with past medical history of obesity, essential hypertension, uterine fibroids, allergy, admitted on OB service for hysterectomy for necrotic fibroid.  Patient postprocedure developed hypoxia requiring 4 L nasal cannula initially along with pleural effusions.  Consult requested for evaluation  Assessment and Plan:  Concern for sepsis - Fever, leukocytosis, tachycardia, tachypnea, will give concern for sepsis.  Fluid bolus, empiric antibiotics, blood cultures are drawn.  Monitor blood pressure closely.  Acute hypoxic respiratory failure - Transition to progressive for BiPAP secondary to tachycardia, dyspnea, tachypnea, hypoxia.  Initially requiring 4 L nasal cannula.  Weaned down to 3 L this morning.  Dyspnea and tachypnea resolved patient feeling improved.  Continue wean O2 as tolerated.  Concern for CHF-unknown ejection fraction - Chest x-ray personally reviewed showing congestion small effusion.  proBNP greater than 200.  Initiated on low-dose Lasix .  Will increase Lasix  60 mg every 8 hours for now.  Diuresis going well.  Urine output excellent.  Echo performed with results pending.  May warrant cardiology consult pending echo results.  Concern for hospital-acquired pneumonia - Fever, hypoxia, given concern for pneumonia.  Patient denying any purulent sputum.  Continues on empiric cefepime  plus vancomycin .  Diabetes mellitus - A1c 6.9.  Currently on sliding scale.  Necrotic uterine fibroid - POD #2 hysterectomy/BSO.  Reviewed, followed closely by OB/Gyn.  JP drain in place.  Pain medications on board.  Subjective: Patient resting comfortably this morning.  States she feels a bit improved from last night.  Breathing easier, down to 3-4 L nasal cannula.  Excellent.  Denies any purulent sputum,  cough, chest pain, nausea, vomiting.  Does admit to abdominal pain as to be expected.  From her surgery.  Physical Exam:  Vitals:   12/06/23 0403 12/06/23 0850 12/06/23 0852 12/06/23 1214  BP: 121/74 115/67  117/77  Pulse: 100 97  (!) 103  Resp: 20 (!) 22  20  Temp: 99 F (37.2 C) 98.4 F (36.9 C) 98.4 F (36.9 C) 98.7 F (37.1 C)  TempSrc: Oral Oral Oral Oral  SpO2: 97% 96%  97%    GENERAL:  Alert, pleasant, no acute distress, obese HEENT:  EOMI, nasal cannula CARDIOVASCULAR: Regular and tachycardic RESPIRATORY: Poor air movement bilaterally  GASTROINTESTINAL: Fresh dressing over abdominal incision with JP drain soft, moderately tender, nondistended EXTREMITIES:  No LE edema bilaterally NEURO:  No new focal deficits appreciated SKIN:  No rashes noted PSYCH:  Appropriate mood and affect     Data Reviewed:  Imaging Studies: CT Angio Chest Pulmonary Embolism (PE) W or WO Contrast Result Date: 12/05/2023 CLINICAL DATA:  Pulmonary embolus suspected with high probability. EXAM: CT ANGIOGRAPHY CHEST WITH CONTRAST TECHNIQUE: Multidetector CT imaging of the chest was performed using the standard protocol during bolus administration of intravenous contrast. Multiplanar CT image reconstructions and MIPs were obtained to evaluate the vascular anatomy. RADIATION DOSE REDUCTION: This exam was performed according to the departmental dose-optimization program which includes automated exposure control, adjustment of the mA and/or kV according to patient size and/or use of iterative reconstruction technique. CONTRAST:  OMNIPAQUE  IOHEXOL  350 MG/ML SOLN COMPARISON:  Chest radiograph 12/05/2023.  CT chest 12/08/2022 FINDINGS: Cardiovascular: Examination is technically indeterminate for evaluation of the pulmonary arteries for possible pulmonary embolus. Poor contrast bolus despite reinjection attempt. As visualized, no large central pulmonary artery emboli are  identified but significant embolus  could be obscured due to poor technique. Cardiac enlargement. No pericardial effusions. Normal caliber thoracic aorta. Mild aortic calcification. Mediastinum/Nodes: No enlarged mediastinal, hilar, or axillary lymph nodes. Thyroid gland, trachea, and esophagus demonstrate no significant findings. Lungs/Pleura: Motion artifact limits evaluation. Shallow inspiration. Small left pleural effusion. Atelectasis or consolidation in both lung bases. This could indicate compressive atelectasis or pneumonia. Aspiration would be a secondary consideration. No pleural effusion or pneumothorax. Upper Abdomen: No acute abnormalities demonstrated. Musculoskeletal: Degenerative changes in the spine. No acute bony abnormalities. Review of the MIP images confirms the above findings. IMPRESSION: 1. Technically indeterminate examination for evaluation of the pulmonary arteries due to poor contrast bolus despite reinjection. This may be related to heart failure. 2. Cardiac enlargement. 3. Right pleural effusion. 4. Bilateral basilar are atelectasis or consolidation. Electronically Signed   By: Elsie Gravely M.D.   On: 12/05/2023 15:58   DG CHEST PORT 1 VIEW Result Date: 12/05/2023 CLINICAL DATA:  Postoperative hypoxia.  Status post hysterectomy. EXAM: PORTABLE CHEST 1 VIEW COMPARISON:  10/30/2023 and abdomen and pelvis CT dated 11/24/2023. Coronary calcium  score CT dated 08/10/2023. FINDINGS: Stable elevated right hemidiaphragm with interval mild borderline enlargement of the cardiac silhouette. Tortuous and partially calcified thoracic aorta. Mild right and moderate left basilar patchy and linear density and interval small right pleural effusion. Unremarkable bones. IMPRESSION: 1. Mild right and moderate left basilar atelectasis and possible pneumonia/aspiration pneumonitis. 2. Small right pleural effusion. 3. Interval borderline cardiomegaly. Electronically Signed   By: Elspeth Bathe M.D.   On: 12/05/2023 10:46   CT ABDOMEN  PELVIS W CONTRAST Result Date: 12/04/2023 CLINICAL DATA:  Abdominal pain. EXAM: CT ABDOMEN AND PELVIS WITH CONTRAST TECHNIQUE: Multidetector CT imaging of the abdomen and pelvis was performed using the standard protocol following bolus administration of intravenous contrast. RADIATION DOSE REDUCTION: This exam was performed according to the departmental dose-optimization program which includes automated exposure control, adjustment of the mA and/or kV according to patient size and/or use of iterative reconstruction technique. CONTRAST:  OMNIPAQUE  IOHEXOL  300 MG/ML  SOLN COMPARISON:  11/24/2023 FINDINGS: Lower chest: Asymmetric elevation right hemidiaphragm with right base atelectasis or scarring, similar to prior. No pleural effusion. Hepatobiliary: No suspicious focal abnormality within the liver parenchyma. There is no evidence for gallstones, gallbladder wall thickening, or pericholecystic fluid. No intrahepatic or extrahepatic biliary dilation. Pancreas: No focal mass lesion. No dilatation of the main duct. No intraparenchymal cyst. No peripancreatic edema. Spleen: No splenomegaly. No suspicious focal mass lesion. Adrenals/Urinary Tract: No adrenal nodule or mass. Kidneys unremarkable. No evidence for hydroureter. The urinary bladder appears normal for the degree of distention. Stomach/Bowel: Small hiatal hernia. Stomach otherwise unremarkable. Duodenum is normally positioned as is the ligament of Treitz. Potential mild circumferential wall thickening in proximal jejunal loops. Distal small bowel is decompressed. The terminal ileum is normal. The appendix is not well visualized, but there is no edema or inflammation in the region of the cecal tip to suggest appendicitis. Diverticuli are seen scattered along the entire length of the colon without CT findings of diverticulitis. Vascular/Lymphatic: No abdominal aortic aneurysm. No abdominal aortic atherosclerotic calcification. There is no gastrohepatic or  hepatoduodenal ligament lymphadenopathy. No retroperitoneal or mesenteric lymphadenopathy. No pelvic sidewall lymphadenopathy. Reproductive: Marked uterine enlargement with multiple calcified fibroids identified. Endometrial canal is obscured and central low attenuation in the uterus is similar to prior and presumably related to fibroid disease. MRI pelvis on 08/31/2021 documented an 8 mm endometrial stripe thickness. Uterus measures  22.2 x 14.7 x 21.3 cm. This compares to 27.6 x 18.7 x 28.7 cm when measured in a similar fashion on the 11/24/2023 exam. Comparing axial image 62/2 today to axial image 56/2 previously, the central and left fluid density component has decreased, measuring 15.2 x 10.9 cm today compared to 23.4 x 15.1 cm previously. There is marked thinning of the left mild myometrium. This decrease is also well demonstrated by comparing coronal image 62/8 today to coronal image 66/7 previously. Other: Interval development of moderate volume ascites in the abdomen and pelvis. Attenuation of the free fluid is higher than would be expected for serosanguineous fluid and may be complicated by proteinaceous debris, infectious debris, or hemorrhage. Musculoskeletal: No worrisome lytic or sclerotic osseous abnormality. IMPRESSION: 1. Interval development of moderate volume ascites in the abdomen and pelvis. Attenuation of the free fluid is higher than would be expected for serosanguineous fluid and may be complicated by proteinaceous debris, infectious debris, or hemorrhage. This interval appearance of moderate volume intraperitoneal free fluid in the 10 days since prior studies is accompanied by definite interval decrease in size of the cystic process in the central uterus. As such, rupture of a cystic degenerated/necrotic fibroid into the peritoneal cavity would be a consideration. If the large central uterine process represents an endometrial cancer, rupture through the myometrium/serosa into the peritoneal  cavity could also produce this appearance. 2. Marked uterine enlargement with multiple calcified fibroids identified. The central and left fluid density component has decreased in size since the prior study. There is marked thinning of the left myometrium. 3. Potential mild circumferential wall thickening in proximal jejunal loops. This is nonspecific but can be seen in the setting of enteritis. Reactive etiology given the new intraperitoneal free fluid is favored. 4. Small hiatal hernia. 5. Asymmetric elevation right hemidiaphragm with right base atelectasis or scarring, similar to prior. Critical Value/emergent results were called by telephone at the time of interpretation on 12/04/2023 at 11:20 am to provider JONATHAN GILLIAM , who verbally acknowledged these results. Electronically Signed   By: Camellia Candle M.D.   On: 12/04/2023 11:21   CT ABDOMEN PELVIS W CONTRAST Result Date: 11/24/2023 CLINICAL DATA:  Left upper quadrant pain EXAM: CT ABDOMEN AND PELVIS WITH CONTRAST TECHNIQUE: Multidetector CT imaging of the abdomen and pelvis was performed using the standard protocol following bolus administration of intravenous contrast. RADIATION DOSE REDUCTION: This exam was performed according to the departmental dose-optimization program which includes automated exposure control, adjustment of the mA and/or kV according to patient size and/or use of iterative reconstruction technique. CONTRAST:  OMNIPAQUE  IOHEXOL  300 MG/ML  SOLN COMPARISON:  12/08/2022 FINDINGS: Lower chest: Right basilar atelectasis is noted. Stable right middle lobe pleural base nodule is seen. Previous exam shows to be stable for more than 2 years and considered benign. No follow-up is recommended. Hepatobiliary: No focal liver abnormality is seen. No gallstones, gallbladder wall thickening, or biliary dilatation. Pancreas: Unremarkable. No pancreatic ductal dilatation or surrounding inflammatory changes. Spleen: Normal in size without  focal abnormality. Adrenals/Urinary Tract: Adrenal glands are within normal limits. Kidneys demonstrate a normal enhancement pattern bilaterally. Normal excretion is seen. The bladder is decompressed. Stomach/Bowel: Scattered diverticular changes noted within the colon. No evidence of diverticulitis is seen. The appendix is not discretely visualized although no inflammatory changes are noted to suggest appendicitis. Stomach and small bowel are within normal limits. Vascular/Lymphatic: No significant vascular findings are present. No enlarged abdominal or pelvic lymph nodes. Reproductive: The uterus is significantly enlarged  measuring approximately 28 cm in transverse diameter and 17 cm in AP dimension. This is increased from the prior exam at which time it measured 19 x 13. Rim calcified fibroid is noted on the right measuring up to 10 cm. A large rim calcified hypodense lesion is noted likely representing a necrotic fibroid. An additional rim calcified fibroid is noted within. These changes are significantly different than that seen on the prior exam. Multiple smaller rim calcified fibroids are noted as well. No adnexal mass is seen. Other: No abdominal wall hernia or abnormality. No abdominopelvic ascites. Musculoskeletal: No acute or significant osseous findings. IMPRESSION: Significant enlargement of the uterus with what appears to be a large centrally necrotic fibroid within. This has changed significantly in the interval from the prior exam. This may contribute to the patient's current symptomatology. Surgical consultation may be warranted. Diverticulosis without diverticulitis. Mild right basilar atelectasis is seen. Electronically Signed   By: Oneil Devonshire M.D.   On: 11/24/2023 20:39    There are no new results to review at this time.  Previous records (including but not limited to H&P, progress notes, nursing notes, TOC management) were reviewed in assessment of this patient.  Labs: CBC: Recent  Labs  Lab 12/04/23 0927 12/04/23 1454 12/05/23 0420 12/05/23 2333 12/06/23 0416  WBC 8.4 3.9* 10.5 17.0* 16.8*  NEUTROABS 7.2  --   --   --   --   HGB 10.5* 11.8* 9.4* 8.8* 8.8*  HCT 35.9* 39.0 30.7* 29.3* 28.9*  MCV 85.9 84.1 83.4 83.2 83.3  PLT 467* 479*  472* 359 361 371   Basic Metabolic Panel: Recent Labs  Lab 12/04/23 0927 12/05/23 0420 12/05/23 1838 12/06/23 0416  NA 139 137  --  137  K 3.6 3.7  --  3.6  CL 101 105  --  102  CO2 26 26  --  27  GLUCOSE 169* 122*  --  87  BUN 13 11  --  10  CREATININE 0.70 0.52  --  0.58  CALCIUM  8.9 8.1*  --  8.2*  MG  --   --  2.0  --    Liver Function Tests: Recent Labs  Lab 12/04/23 0927 12/05/23 0420 12/06/23 0416  AST 21 14* 13*  ALT 16 13 12   ALKPHOS 62 38 62  BILITOT 0.7 0.4 0.5  PROT 8.7* 6.2* 5.8*  ALBUMIN  2.5* 2.0* 1.7*   CBG: Recent Labs  Lab 12/05/23 1844 12/06/23 1212  GLUCAP 100* 96    Scheduled Meds:  chewing gum (ORBIT) sugar free  1 Stick Oral QID   Chlorhexidine  Gluconate Cloth  6 each Topical Daily   enoxaparin  (LOVENOX ) injection  60 mg Subcutaneous Q24H   famotidine   20 mg Oral BID   furosemide   60 mg Intravenous Q8H   insulin  aspart  0-15 Units Subcutaneous TID WC   pantoprazole  (PROTONIX ) IV  40 mg Intravenous Q12H   polyethylene glycol  17 g Oral QODAY   prenatal multivitamin  1 tablet Oral Q1200   simethicone   80 mg Oral TID   Continuous Infusions:  ceFEPime  (MAXIPIME ) IV 2 g (12/06/23 0523)   vancomycin  750 mg (12/06/23 0856)   PRN Meds:.acetaminophen , diphenhydrAMINE , HYDROmorphone  (DILAUDID ) injection, LORazepam , ondansetron  **OR** ondansetron  (ZOFRAN ) IV, oxyCODONE   Family Communication: None at bedside  Disposition: Status is: Inpatient Remains inpatient appropriate because: Acute hypoxic respiratory failure     Time spent: 51 minutes  Length of inpatient stay: 2 days  Author: Carliss LELON Canales, DO 12/06/2023  12:31 PM  For on call review [email or web address].

## 2023-12-06 NOTE — Discharge Summary (Signed)
 Gynecology Physician Postoperative Discharge Summary  Patient ID: Amanda Paul MRN: [id number] DOB/AGE: 50/21/1977 50 y.o.  Admit Date: 12/04/2023 Discharge Date: 12/17/2023  Preoperative Diagnoses:  Acute Abdomen Fibroids  Postoperative Diagnoses:  Acute Abdomen Fibroids Aspiration pneumonia Anemia Cardiomegaly Pre-diabetes RUQ abscess  Procedures: Procedure(s) (LRB): HYSTERECTOMY, ABDOMINAL, WITH SALPINGO-OOPHORECTOMY (N/A)  Hospital Course:  Amanda Paul is a 48 y.o. G0P0000  admitted for pain management but upon arrival to [facility], she was concerning for acute abdomen and appeared to be having respiratory decompensation.  She underwent the procedures as mentioned above, her operation was uncomplicated but noteworthy for the 4L of necrotic fluid that was removed from her abdomen. For further details about surgery, please refer to the operative report and pictures.  Patient had an postoperative course complicated by PNA diagnosed on POD1. The hospitalist team was consulted.  She had a CTPE which did not show PE. CXR had shown concern for aspiration PNA. She developed SOB on POD1 and a fever.  She was started on Vancomycin  and Cefepime . For her cardiomegaly noted on imaging, she had an echo on POD2 which showed diastolic dysfunction but overall normal EF of 55%. On POD4 her Vancomycin  was discontinued. Infectious disease was consulted when her WBC were not trending down despite broad coverage. She had a CT which showed a subdiaphragmatic fluid collection. IR was consulted and a drain was placed on 8/31. Drain was in place on discharge. She initially had an O2 requirement but was able to wean off. She was transitioned to PO ABX on POD12 and will continue for 3 weeks post-discharge.  She had a JP drain placed at the time of surgery. It was removed on the day of discharge, POD13. Her staples were also removed prior to discharge.   Patient was started on oral iron   therapy for clinically significant but asymptomatic acute postoperative anemia due to expected blood loss.  By time of discharge on POD#13, her pain was controlled on oral pain medications; she was ambulating, voiding without difficulty, tolerating regular diet and passing flatus. She was deemed stable for discharge to home.   Significant Labs:    Latest Ref Rng & Units 12/17/2023    3:55 AM 12/16/2023    3:05 AM 12/15/2023    3:22 AM  CBC  WBC 4.0 - 10.5 K/uL 14.7  16.7  16.3   Hemoglobin 12.0 - 15.0 g/dL 8.9  9.5  9.4   Hematocrit 36.0 - 46.0 % 29.8  31.2  30.7   Platelets 150 - 400 K/uL 699  665  627     Discharge Exam: Blood pressure 115/64, pulse 100, temperature 98.5 F (36.9 C), temperature source Oral, resp. rate 20, height 5' 5 (1.651 m), weight 109.8 kg, last menstrual period 09/14/2023, SpO2 94%. General appearance: alert and no distress  Resp: normal effort on room air Cardio: regular rate GI: soft, non-tender, non-distended. LLQ drain removed, site covered with steris. RUQ drain in place, dressing c/d/I with small about of serosanguinous drainage Incision: Vertical midline with staples removed. Minor superficial separation immediately inferior to the umbilicus that was reinforced with steri strips. Otherwise healing well.  Extremities: no evidence of DVT  Discharged Condition: Stable  Disposition: Discharge disposition: 01-Home or Self Care       Discharge Instructions     Call MD for:  persistant nausea and vomiting   Complete by: As directed    Call MD for:  redness, tenderness, or signs of infection (pain, swelling, redness, odor or green/yellow  discharge around incision site)   Complete by: As directed    Call MD for:  severe uncontrolled pain   Complete by: As directed    Call MD for:  temperature >100.4   Complete by: As directed    Diet general   Complete by: As directed    Discharge wound care:   Complete by: As directed    Surgical incision & left  lower incision (where drain was removed) - keep clean and dry. Do not scrub incision. You do not need a special dressing. If the steri strips do not fall off within 1 week, please remove them in the shower   Increase activity slowly   Complete by: As directed    Lifting restrictions   Complete by: As directed    No lifting more than 10lbs until cleared by a provider   May shower / Bathe   Complete by: As directed    No tub baths, swimming, hot tubs, or soaking incisions until cleared by a provider. May shower.   May walk up steps   Complete by: As directed    Sexual Activity Restrictions   Complete by: As directed    Nothing in the vagina (intercourse, tampons, douching) for 12 weeks      Allergies as of 12/17/2023       Reactions   Amoxicillin Hives, Itching, Swelling   Swollen eyes and throat.   Doxycycline  Hyclate Swelling   Bilateral lower leg swelling   Penicillins Hives, Swelling        Medication List     STOP taking these medications    amLODipine  10 MG tablet Commonly known as: NORVASC    furosemide  40 MG tablet Commonly known as: Lasix        TAKE these medications    benzonatate  100 MG capsule Commonly known as: Tessalon  Perles Take 1 capsule (100 mg total) by mouth 3 (three) times daily as needed for cough.   cetirizine 10 MG tablet Commonly known as: ZYRTEC Take 10 mg by mouth daily.   FeroSul 325 (65 Fe) MG tablet Generic drug: ferrous sulfate  Take 1 tablet (325 mg total) by mouth every other day.   ibuprofen  600 MG tablet Commonly known as: ADVIL  Take 1 tablet (600 mg total) by mouth every 6 (six) hours as needed. What changed:  when to take this reasons to take this   levofloxacin  750 MG tablet Commonly known as: LEVAQUIN  Take 1 tablet (750 mg total) by mouth daily. Start taking on: December 18, 2023   meloxicam  15 MG tablet Commonly known as: MOBIC  TAKE 1 TABLET (15 MG TOTAL) BY MOUTH DAILY.   metroNIDAZOLE  500 MG tablet Commonly  known as: FLAGYL  Take 1 tablet (500 mg total) by mouth every 12 (twelve) hours for 21 days.   multivitamin with minerals tablet Take 1 tablet by mouth daily.   rosuvastatin  20 MG tablet Commonly known as: Crestor  Take 1 tablet (20 mg total) by mouth daily. What changed: when to take this   sodium chloride  flush 0.9 % Soln Commonly known as: NS Please flush drain one per day with 5mL of prefilled normal saline syringe   spironolactone  25 MG tablet Commonly known as: ALDACTONE  Take 1 tablet (25 mg total) by mouth daily. Start taking on: December 18, 2023   torsemide  20 MG tablet Commonly known as: DEMADEX  Take 2 tablets (40 mg total) by mouth daily. Start taking on: December 18, 2023   Vitamin D  50 MCG (2000 UT) Caps Take 2,000  Units by mouth at bedtime.               Durable Medical Equipment  (From admission, onward)           Start     Ordered   12/16/23 1521  For home use only DME 4 wheeled rolling walker with seat  Once       Question Answer Comment  Patient needs a walker to treat with the following condition Unsteadiness on feet   Patient needs a walker to treat with the following condition Other abnormalities of gait and mobility   Patient needs a walker to treat with the following condition Muscle weakness (generalized)      12/16/23 1525   12/16/23 1521  For home use only DME Tub bench  Once        12/16/23 1525              Discharge Care Instructions  (From admission, onward)           Start     Ordered   12/17/23 0000  Discharge wound care:       Comments: Surgical incision & left lower incision (where drain was removed) - keep clean and dry. Do not scrub incision. You do not need a special dressing. If the steri strips do not fall off within 1 week, please remove them in the shower   12/17/23 1430           Future Appointments  Date Time Provider Department Center  12/21/2023  8:15 AM [facility] None   12/29/2023  3:15 PM Vu, Constance DASEN, MD [facility]  03/21/2024 10:40 AM Jarold Medici, MD [facility] [id number]  09/26/2024  8:40 AM Jarold Medici, MD [facility] [phone] [facility]    Follow-up Information     [facility] Follow up.   Why: Jamee) Physical therapy/Occupational Therapy arranged. Office will contact you to follow up after hospital discharge for scheduling. Contact information: [address] [place] [address] [phone]         [facility]. Go in 3 day(s).   Specialty: Cardiology Why: Hospital follow up 12/22/2023 @ 8:15 am  Please bring a current medication list to appointment FREE valet parking, Entrance C, off [employer] and [employer] entrance Contact information: [address] [place]  [address] [phone]        [facility] Of [place]. [facility] Follow up on 12/29/2023.   Specialty: Infectious Diseases Why: Hospital Discharge Follow Up Contact information: [address] [place], [address] [place]  [address] [phone]        Center for [facility] Follow up on 01/09/2024.   Specialty: Obstetrics and Gynecology Why: The office will call you to schedule a post operative appointment with Dr. Cleatus. If you have concerns about your incision or hospitalization, please call us  at this phone number. Contact information: [address] [place]  [address] [phone]                Total discharge time: 45 minutes   Signed: Kieth Carolin, MD Obstetrician & Gynecologist, [facility] for [facility], [facility]

## 2023-12-06 NOTE — Progress Notes (Signed)
  Echocardiogram 2D Echocardiogram has been performed.  Juliene JINNY Rucks 12/06/2023, 11:26 AM

## 2023-12-07 DIAGNOSIS — J9601 Acute respiratory failure with hypoxia: Secondary | ICD-10-CM | POA: Diagnosis not present

## 2023-12-07 LAB — CBC
HCT: 30 % — ABNORMAL LOW (ref 36.0–46.0)
Hemoglobin: 9.2 g/dL — ABNORMAL LOW (ref 12.0–15.0)
MCH: 25.5 pg — ABNORMAL LOW (ref 26.0–34.0)
MCHC: 30.7 g/dL (ref 30.0–36.0)
MCV: 83.1 fL (ref 80.0–100.0)
Platelets: 368 K/uL (ref 150–400)
RBC: 3.61 MIL/uL — ABNORMAL LOW (ref 3.87–5.11)
RDW: 16.2 % — ABNORMAL HIGH (ref 11.5–15.5)
WBC: 19.7 K/uL — ABNORMAL HIGH (ref 4.0–10.5)
nRBC: 0 % (ref 0.0–0.2)

## 2023-12-07 LAB — GLUCOSE, CAPILLARY
Glucose-Capillary: 100 mg/dL — ABNORMAL HIGH (ref 70–99)
Glucose-Capillary: 121 mg/dL — ABNORMAL HIGH (ref 70–99)
Glucose-Capillary: 92 mg/dL (ref 70–99)
Glucose-Capillary: 91 mg/dL (ref 70–99)

## 2023-12-07 LAB — COMPREHENSIVE METABOLIC PANEL WITH GFR
ALT: 12 U/L (ref 0–44)
AST: 14 U/L — ABNORMAL LOW (ref 15–41)
Albumin: 1.7 g/dL — ABNORMAL LOW (ref 3.5–5.0)
Alkaline Phosphatase: 57 U/L (ref 38–126)
Anion gap: 9 (ref 5–15)
BUN: 10 mg/dL (ref 6–20)
CO2: 29 mmol/L (ref 22–32)
Calcium: 8.4 mg/dL — ABNORMAL LOW (ref 8.9–10.3)
Chloride: 99 mmol/L (ref 98–111)
Creatinine, Ser: 0.73 mg/dL (ref 0.44–1.00)
GFR, Estimated: >60 mL/min (ref >60)
Glucose, Bld: 106 mg/dL — ABNORMAL HIGH (ref 70–99)
Potassium: 3.4 mmol/L — ABNORMAL LOW (ref 3.5–5.1)
Sodium: 137 mmol/L (ref 135–145)
Total Bilirubin: 0.4 mg/dL (ref 0.0–1.2)
Total Protein: 5.8 g/dL — ABNORMAL LOW (ref 6.5–8.1)

## 2023-12-07 MED ORDER — SPIRONOLACTONE 25 MG PO TABS
25.0000 mg | ORAL_TABLET | Freq: Every day | ORAL | Status: DC
  Administered 2023-12-07 – 2023-12-17 (×11): 25 mg via ORAL
  Filled 2023-12-07 (×11): qty 1

## 2023-12-07 MED ORDER — PANTOPRAZOLE SODIUM 40 MG PO TBEC
40.0000 mg | DELAYED_RELEASE_TABLET | Freq: Every day | ORAL | Status: DC
  Administered 2023-12-08 – 2023-12-17 (×10): 40 mg via ORAL
  Filled 2023-12-07 (×10): qty 1

## 2023-12-07 MED ORDER — POTASSIUM CHLORIDE CRYS ER 20 MEQ PO TBCR
40.0000 meq | EXTENDED_RELEASE_TABLET | Freq: Two times a day (BID) | ORAL | Status: DC
  Administered 2023-12-07 (×2): 40 meq via ORAL
  Filled 2023-12-07 (×2): qty 2

## 2023-12-07 MED ORDER — BISACODYL 5 MG PO TBEC
5.0000 mg | DELAYED_RELEASE_TABLET | Freq: Once | ORAL | Status: AC
  Administered 2023-12-07: 5 mg via ORAL
  Filled 2023-12-07: qty 1

## 2023-12-07 MED ORDER — FUROSEMIDE 10 MG/ML IJ SOLN
40.0000 mg | Freq: Two times a day (BID) | INTRAMUSCULAR | Status: DC
  Administered 2023-12-07 (×2): 40 mg via INTRAVENOUS
  Filled 2023-12-07 (×2): qty 4

## 2023-12-07 MED ORDER — ALBUMIN HUMAN 25 % IV SOLN
25.0000 g | Freq: Four times a day (QID) | INTRAVENOUS | Status: AC
  Administered 2023-12-07 (×2): 25 g via INTRAVENOUS
  Filled 2023-12-07 (×2): qty 100

## 2023-12-07 NOTE — Progress Notes (Signed)
 Patient states she does not wear a BiPAP/CPAP machine.  No respiratory distress noted.  BiPAP available if needed.

## 2023-12-07 NOTE — Progress Notes (Signed)
 Gynecology Progress Note  Admission Date: 12/04/2023 Current Date: 12/07/2023 11:47 AM  Amanda Paul is a 48 y.o. G0P0000 POD2 s/p TAH/BSO for necrotic fibroid and uterine rupture remote from [place] in 2022.   History complicated by: Patient Active Problem List   Diagnosis Date Noted   DM2 (diabetes mellitus, type 2) (HCC) 12/06/2023   Sepsis (HCC) 12/06/2023   Acute hypoxic respiratory failure (HCC) 12/06/2023   CHF with unknown LVEF (HCC) 12/06/2023   Pneumonia 12/06/2023   Fibroid 12/04/2023   Persistent cough 11/20/2023   Peripheral edema 11/20/2023   Chronic pain of both ankles 10/01/2023   Allergic rhinitis with postnasal drip 10/01/2023   Class 3 severe obesity due to excess calories with serious comorbidity and body mass index (BMI) of 45.0 to 49.9 in adult 03/21/2023   Pure hypercholesterolemia 03/21/2023   Coronary artery calcification 03/21/2023   Enlarged liver 03/21/2023   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 Fever overnight 101.3.   Subjective:  She feels well this morning. She has no complaints. She denies N/V. She does not feel feverish. Denies flatus but reports burping now.   Objective:    Current Vital Signs 24h Vital Sign Ranges  T 99.6 F (37.6 C) Temp  Avg: 99.7 F (37.6 C)  Min: 98.7 F (37.1 C)  Max: 101.3 F (38.5 C)  BP (!) 141/79 BP  Min: 102/70  Max: 141/79  HR 96 Pulse  Avg: 102.5  Min: 90  Max: 119  RR 20 Resp  Avg: 20  Min: 20  Max: 20  SaO2 92 % Nasal Cannula SpO2  Avg: 93.8 %  Min: 91 %  Max: 97 %       24 Hour I/O Current Shift I/O  Time Ins Outs 08/26 0701 - 08/27 0700 In: 1375.6  Out: 3180  [Urine:3125; Drains:55] 08/27 0701 - 08/27 1900 In: -  Out: 40 [Drains:40]     Patient Vitals for the past 24 hrs:  BP Temp Temp src Pulse Resp SpO2  12/07/23 0800 (!) 141/79 -- -- 96 20 92 %  12/07/23 0334 132/78 99.6 F (37.6 C) Oral 90 20 96 %  12/06/23 2340 114/62 99.3 F (37.4 C) Oral 98 20 94 %  12/06/23 1931 102/70 (!) 101.3 F (38.5 C) Axillary (!) 119 20 91 %  12/06/23 1610 124/76 99.6 F (37.6 C) Oral (!) 109 20 93 %  12/06/23 1214 117/77 98.7 F (37.1 C) Oral (!) 103 20 97 %    Physical exam: General appearance: alert, cooperative, and appears stated age Abdomen: soft, nttp, non-distended, c/d/I incision with honeycomb in place but serosanguinous fluid at the base. JP with 50 cc of sero-sang fluid GU: No gross VB Lungs: Normal respiratory effort, lower lung bases with faint crackles Heart: S1, S2 normal, no murmur, rub or gallop, regular rate and rhythm Extremities: LE edema stable, non-pitting, SCDs on Skin: warm/dry Psych: appropriate Neurologic: Grossly normal  Labs  Recent Labs  Lab 12/05/23 2333 12/06/23 0416 12/07/23 0306  WBC 17.0* 16.8* 19.7*  HGB 8.8* 8.8* 9.2*  HCT 29.3* 28.9* 30.0*  PLT 361 371 368    Recent Labs  Lab 12/05/23 0420 12/06/23 0416 12/07/23  0306  NA 137 137 137  K 3.7 3.6 3.4*  CL 105 102 99  CO2 26 27 29   BUN 11 10 10   CREATININE 0.52 0.58 0.73  CALCIUM  8.1* 8.2* 8.4*  PROT 6.2* 5.8* 5.8*  BILITOT 0.4 0.5 0.4  ALKPHOS 38 62 57  ALT 13 12 12   AST 14* 13* 14*  GLUCOSE 122* 87 106*    Assessment & Plan:  Patient status improving.  *GYN:  - d/c foley today to reduce risk of infection. Bedside commode - JP drain with minimal and normal output   *Cardiac:  - on tele - no meds for HTN at this time.  - Echo yesterday. Due to low albumin  from loss of 4L of fluids, will keep close on on fluids. Currently heplocked.   - Reviewed with her RN Delon that they may remove IV from the EJ.   *ID: - Vanc/Cefepime  for  possible PNA although more likely reaction from the fluid.  - WBC slight increase to 19.8. Will continue to observe.  - No purulent fluid from JP which is reassuring.   *Heme: - Hgb stable at this time. If goes below 8 will consider transfusion.  - Will eventually start PO iron  every other day once bowel function has resumed.   *Pulm:  - Encouraged I/S - O2 is 2L currently.  - Vanc and cefepime  currently.   *Pain:  - Well controlled - using oxycodone  sparingly  *FEN/GI:  - Diet as tolerated.  - Encouraged continued high protein diet - Chewing gum. PPI.   *PPx:  - pLovenox. Continue SCDs.  - Encourage ambulation - she is working with PT/OT. OT expressed concern that she made need SNF. Will consult case management.   *Dispo: pending bowel function and other medical goals met  Code Status: Full Code  Vina Solian, MD Attending Center for [facility])

## 2023-12-07 NOTE — Progress Notes (Signed)
 Gynecology Progress Note  Admission Date: 12/04/2023 Current Date: 12/07/2023 10:35 AM  Amanda Paul is a 48 y.o. G0P0000 POD#3 s/p TAH/BSO for necrotic fibroid and uterine rupture remote from [place] in 2022.   History complicated by: Patient Active Problem List   Diagnosis Date Noted   DM2 (diabetes mellitus, type 2) (HCC) 12/06/2023   Sepsis (HCC) 12/06/2023   Acute hypoxic respiratory failure (HCC) 12/06/2023   CHF with unknown LVEF (HCC) 12/06/2023   Pneumonia 12/06/2023   Fibroid 12/04/2023   Persistent cough 11/20/2023   Peripheral edema 11/20/2023   Chronic pain of both ankles 10/01/2023   Allergic rhinitis with postnasal drip 10/01/2023   Class 3 severe obesity due to excess calories with serious comorbidity and body mass index (BMI) of 45.0 to 49.9 in adult 03/21/2023   Pure hypercholesterolemia 03/21/2023   Coronary artery calcification 03/21/2023   Enlarged liver 03/21/2023   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 Has ambulated some in the halls and taken some PO. No flatus yet. No nausea, vomiting; has had some episodes of belching.   Objective:    Current Vital Signs 24h Vital Sign Ranges  T 99.6 F (37.6 C) Temp  Avg: 99.7 F (37.6 C)  Min: 98.7 F (37.1 C)  Max: 101.3 F (38.5 C)  BP (!) 141/79 BP  Min: 102/70  Max: 141/79  HR 96 Pulse  Avg: 102.5  Min: 90  Max: 119  RR 20 Resp  Avg: 20  Min: 20  Max: 20  SaO2 92 % Nasal Cannula SpO2  Avg: 93.8 %  Min: 91 %  Max: 97 %       24 Hour I/O Current Shift I/O  Time Ins Outs 08/26 0701 - 08/27 0700 In: 1375.6  Out: 3180 [Urine:3125; Drains:55] No  intake/output data recorded.   Patient Vitals for the past 24 hrs:  BP Temp Temp src Pulse Resp SpO2  12/07/23 0800 (!) 141/79 -- -- 96 20 92 %  12/07/23 0334 132/78 99.6 F (37.6 C) Oral 90 20 96 %  12/06/23 2340 114/62 99.3 F (37.4 C) Oral 98 20 94 %  12/06/23 1931 102/70 (!) 101.3 F (38.5 C) Axillary (!) 119 20 91 %  12/06/23 1610 124/76 99.6 F (37.6 C) Oral (!) 109 20 93 %  12/06/23 1214 117/77 98.7 F (37.1 C) Oral (!) 103 20 97 %    Physical exam: General appearance: alert, cooperative, and appears stated age Abdomen: soft, nttp, non distended, rare bowel sounds. Dressing c/d/I, JP with serosang fluid in bulb and tube GU: No gross VB Lungs: Normal respiratory effort Heart: S1, S2 normal, no murmur, rub or gallop, regular rate and rhythm Skin: warm/dry Psych: appropriate Neurologic: Grossly normal  Labs  Fluid culture: few GBS, rare WBC on gram stain (no organisms seen) Blood cure: NGTD Recent Labs  Lab 12/05/23 2333 12/06/23 0416 12/07/23 0306  WBC 17.0* 16.8* 19.7*  HGB 8.8* 8.8* 9.2*  HCT 29.3* 28.9* 30.0*  PLT 361 371 368    Recent Labs  Lab 12/05/23 0420 12/06/23 0416 12/07/23 0306  NA 137 137 137  K 3.7 3.6 3.4*  CL 105 102 99  CO2 26 27 29   BUN 11 10 10   CREATININE 0.52 0.58 0.73  CALCIUM  8.1* 8.2* 8.4*  PROT 6.2* 5.8* 5.8*  BILITOT 0.4 0.5 0.4  ALKPHOS 38 62 57  ALT 13 12 12   AST 14* 13* 14*  GLUCOSE 122* 87 106*    Radiology: ECHOCARDIOGRAM REPORT       Patient Name:   Amanda Paul Date of Exam: 12/06/2023  Medical Rec #:  [id number]                 Height:       65.0 in  Accession #:    [id number]                Weight:       294.5 lb  Date of Birth:  [birth year]                 BSA:          2.332 m  Patient Age:    48 years                  BP:           115/67 mmHg  Patient Gender: F                         HR:           100 bpm.  Exam Location:  Inpatient   Procedure: 2D Echo, Cardiac Doppler and Color Doppler  (Both Spectral and  Color            Flow Doppler were utilized during procedure).   Indications:    CHF    History:        Patient has prior history of Echocardiogram examinations,  most                 recent 08/10/2023. CHF, CAD, Signs/Symptoms:Edema; Risk                  Factors:Hypertension, Dyslipidemia and Non-Smoker.    Sonographer:    Juliene Rucks  Sonographer#2:  Ellouise Mose RDCS  Referring Phys: [id number] LAWRENCE A BASS   IMPRESSIONS     1. Left ventricular ejection fraction, by estimation, is 55%. The left  ventricle has normal function. Left ventricular endocardial border not  optimally defined to evaluate regional wall motion. Left ventricular  diastolic parameters are consistent with  Grade II diastolic dysfunction (pseudonormalization).   2. D-shaped interventricular septum suggestive of RV pressure/volume  overload. Right ventricular systolic function is mildly reduced. The right  ventricular size is moderately enlarged. There is moderately elevated  pulmonary artery systolic pressure. The   estimated right ventricular systolic pressure is 49.1 mmHg.   3. The mitral valve is normal in structure. No evidence of mitral valve  regurgitation. No evidence of mitral stenosis.   4. The aortic valve is tricuspid. Aortic valve regurgitation is not  visualized. No aortic stenosis is present.   5. The inferior vena cava is dilated in size with <50% respiratory  variability, suggesting right atrial pressure of 15 mmHg.   6. RV dysfunction, would rule out PE.   FINDINGS   Left Ventricle: Left ventricular ejection fraction, by estimation, is  55%. The left ventricle has normal function. Left ventricular endocardial  border not optimally defined to evaluate regional wall motion. The left  ventricular internal cavity size was  normal in size. There is no left ventricular  hypertrophy. Left ventricular  diastolic parameters are consistent with Grade II diastolic dysfunction   (pseudonormalization).   Right Ventricle: D-shaped interventricular septum suggestive of RV  pressure/volume overload. The right ventricular size is moderately  enlarged. No increase in right ventricular wall thickness. Right  ventricular systolic function is mildly reduced. There  is moderately elevated pulmonary artery systolic pressure. The tricuspid  regurgitant velocity is 2.92 m/s, and with an assumed right atrial  pressure of 15 mmHg, the estimated right ventricular systolic pressure is  49.1 mmHg.   Left Atrium: Left atrial size was normal in size.   Right Atrium: Right atrial size was normal in size.   Pericardium: Trivial pericardial effusion is present.   Mitral Valve: The mitral valve is normal in structure. No evidence of  mitral valve regurgitation. No evidence of mitral valve stenosis.   Tricuspid Valve: The tricuspid valve is normal in structure. Tricuspid  valve regurgitation is trivial.   Aortic Valve: The aortic valve is tricuspid. Aortic valve regurgitation is  not visualized. No aortic stenosis is present. Aortic valve mean gradient  measures 6.0 mmHg. Aortic valve peak gradient measures 12.5 mmHg. Aortic  valve area, by VTI measures 3.23   cm.   Pulmonic Valve: The pulmonic valve was normal in structure. Pulmonic valve  regurgitation is not visualized.   Aorta: The aortic root is normal in size and structure.   Venous: The inferior vena cava is dilated in size with less than 50%  respiratory variability, suggesting right atrial pressure of 15 mmHg.   IAS/Shunts: No atrial level shunt detected by color flow Doppler.     LEFT VENTRICLE  PLAX 2D  LVIDd:         5.40 cm      Diastology  LVIDs:         3.60 cm      LV e' medial:    7.62 cm/s  LV PW:         1.00 cm      LV E/e' medial:  12.3  LV IVS:        1.00 cm      LV e' lateral:   15.20 cm/s  LVOT diam:     2.10 cm      LV E/e' lateral: 6.1  LV SV:         85  LV SV Index:   36  LVOT Area:      3.46 cm    LV Volumes (MOD)  LV vol d, MOD A2C: 120.0 ml  LV vol d, MOD A4C: 114.0 ml  LV vol s, MOD A2C: 47.8 ml  LV vol s, MOD A4C: 45.5 ml  LV SV MOD A2C:     72.2 ml  LV SV MOD A4C:     114.0 ml  LV SV MOD BP:      76.1 ml   RIGHT VENTRICLE            IVC  RV S prime:     9.64 cm/s  IVC diam: 2.30 cm  TAPSE (M-mode): 1.4 cm   LEFT ATRIUM             Index        RIGHT ATRIUM           Index  LA diam:        3.60 cm 1.54 cm/m   RA Area:     17.80 cm  LA Vol (A2C):   36.9 ml 15.82 ml/m  RA Volume:   48.50 ml  20.80 ml/m  LA Vol (A4C):   42.3 ml 18.14 ml/m  LA Biplane Vol: 40.0 ml 17.15 ml/m   AORTIC VALVE  AV Area (Vmax):    2.86 cm  AV Area (Vmean):   2.81 cm  AV Area (VTI):     3.23 cm  AV Vmax:           177.00 cm/s  AV Vmean:          115.000 cm/s  AV VTI:            0.263 m  AV Peak Grad:      12.5 mmHg  AV Mean Grad:      6.0 mmHg  LVOT Vmax:         146.00 cm/s  LVOT Vmean:        93.300 cm/s  LVOT VTI:          0.245 m  LVOT/AV VTI ratio: 0.93    AORTA  Ao Root diam: 3.10 cm  Ao Asc diam:  2.50 cm   MITRAL VALVE               TRICUSPID VALVE  MV Area (PHT): 4.49 cm    TR Peak grad:   34.1 mmHg  MV Decel Time: 169 msec    TR Vmax:        292.00 cm/s  MV E velocity: 93.40 cm/s  MV A velocity: 75.70 cm/s  SHUNTS  MV E/A ratio:  1.23        Systemic VTI:  0.24 m                             Systemic Diam: 2.10 cm   Dalton McleanMD  Electronically signed by Ezra Kanner  Signature Date/Time: 12/06/2023/3:47:53 PM  Assessment & Plan:  Patient status improving.  *GYN: follow up final pathology. D/c foley ordered. Will order one dose of dulcolax. PT and OT following. Hospitalist service helping manage and appreciate recs.  *CV: see echo report above. No obvious PE on 8/25.  *ID: D#2 Vanc/Cefepime  for presumed PNA. Follow up final cultures *Heme: stable and no s/s of anemia.  *Pulm: see above. Patient stable on 0.5 to 1L [place] *Pain: continue PO  PRNs *FEN/GI: DM2 diet. Dulcolax x 1 ordered. PPI *PPx: Lovenox , OOB with help.  *Dispo: likely POD4.   Code Status: Full Code  Bebe Izell Raddle MD Attending Center for [facility]) GYN Consult Phone: [phone] (M-F, 0800-1700) & [phone] (Off hours, weekends, holidays)

## 2023-12-07 NOTE — Progress Notes (Signed)
 PROGRESS NOTE    Amanda Paul  FMW:[id number] DOB: 20-May-1975 DOA: 12/04/2023 PCP: Jarold Medici, MD  48/F with obesity, hypertension, uterine fibroids was admitted to [facility] service 8/24 for hysterectomy for necrotic fibroid, postop developed hypoxia, workup noted below for pleural effusions. - TRH consulting, started on diuretics - Also noted to be febrile, concern for sepsis postop, currently on broad-spectrum antibiotics   Subjective: -Feels better overall  Assessment and Plan:  Acute hypoxic respiratory failure Acute diastolic CHF - Primarily secondary to volume overload, fever, sepsis also contributing -2D echo with preserved EF, grade 2 diastolic dysfunction, mildly reduced RV - Continue Lasix  40 mg twice daily - Add Aldactone  - increase activity, out of bed to chair  Sepsis, fever, leukocytosis - Suspect this is coming from her postop state -sp hysterectomy, currently on vancomycin  and cefepime  - Blood cultures are negative, low clinical suspicion for pneumonia, x-ray findings secondary to atelectasis and pleural effusions  Necrotic uterine fibroid Uterine rupture, chemical peritonitis -sp total abdominal hysterectomy, BSO on 8/24  -antibiotics as above, she has a JP drain - Per GYN  Severe hypoalbuminemia - Albumin  is 1.7, likely worsened in the setting of above infection/sepsis, fatty liver noted on abdominal ultrasound in 12/24 - Definitely contributing to third spacing  Type 2 diabetes mellitus A1c is 6.9, CBGs are stable  DVT prophylaxis: Lovenox  Code Status: Full code Family Communication: None present Disposition Plan:     Objective: Vitals:   12/06/23 1931 12/06/23 2340 12/07/23 0334 12/07/23 0800  BP: 102/70 114/62 132/78 (!) 141/79  Pulse: (!) 119 98 90 96  Resp: 20 20 20 20   Temp: (!) 101.3 F (38.5 C) 99.3 F (37.4 C) 99.6 F (37.6 C)   TempSrc: Axillary Oral Oral   SpO2: 91% 94% 96% 92%    Intake/Output Summary (Last 24  hours) at 12/07/2023 1043 Last data filed at 12/07/2023 0617 Gross per 24 hour  Intake 1375.58 ml  Output 3180 ml  Net -1804.42 ml   There were no vitals filed for this visit.  Examination:  General exam: Appears calm and comfortable  Respiratory system: Clear to auscultation Cardiovascular system: S1 & S2 heard, RRR.  Abd: nondistended, soft and nontender.Normal bowel sounds heard. Central nervous system: Alert and oriented. No focal neurological deficits. Extremities: no edema Skin: No rashes Psychiatry:  Mood & affect appropriate.     Data Reviewed:   CBC: Recent Labs  Lab 12/04/23 0927 12/04/23 1454 12/05/23 0420 12/05/23 2333 12/06/23 0416 12/07/23 0306  WBC 8.4 3.9* 10.5 17.0* 16.8* 19.7*  NEUTROABS 7.2  --   --   --   --   --   HGB 10.5* 11.8* 9.4* 8.8* 8.8* 9.2*  HCT 35.9* 39.0 30.7* 29.3* 28.9* 30.0*  MCV 85.9 84.1 83.4 83.2 83.3 83.1  PLT 467* 479*  472* 359 361 371 368   Basic Metabolic Panel: Recent Labs  Lab 12/04/23 0927 12/05/23 0420 12/05/23 1838 12/06/23 0416 12/07/23 0306  NA 139 137  --  137 137  K 3.6 3.7  --  3.6 3.4*  CL 101 105  --  102 99  CO2 26 26  --  27 29  GLUCOSE 169* 122*  --  87 106*  BUN 13 11  --  10 10  CREATININE 0.70 0.52  --  0.58 0.73  CALCIUM  8.9 8.1*  --  8.2* 8.4*  MG  --   --  2.0  --   --    GFR: Estimated Creatinine Clearance:  116.3 mL/min (by C-G formula based on SCr of 0.73 mg/dL). Liver Function Tests: Recent Labs  Lab 12/04/23 0927 12/05/23 0420 12/06/23 0416 12/07/23 0306  AST 21 14* 13* 14*  ALT 16 13 12 12   ALKPHOS 62 38 62 57  BILITOT 0.7 0.4 0.5 0.4  PROT 8.7* 6.2* 5.8* 5.8*  ALBUMIN  2.5* 2.0* 1.7* 1.7*   Recent Labs  Lab 12/04/23 0927  LIPASE 31   No results for input(s): AMMONIA in the last 168 hours. Coagulation Profile: Recent Labs  Lab 12/04/23 1454  INR 1.2   Cardiac Enzymes: No results for input(s): CKTOTAL, CKMB, CKMBINDEX, TROPONINI in the last 168 hours. BNP  (last 3 results) No results for input(s): PROBNP in the last 8760 hours. HbA1C: Recent Labs    12/05/23 0420  HGBA1C 6.5*   CBG: Recent Labs  Lab 12/05/23 1844 12/06/23 1212 12/06/23 1741 12/06/23 2115 12/07/23 0621  GLUCAP 100* 96 107* 118* 100*   Lipid Profile: No results for input(s): CHOL, HDL, LDLCALC, TRIG, CHOLHDL, LDLDIRECT in the last 72 hours. Thyroid Function Tests: Recent Labs    12/05/23 0420  TSH 0.334*  FREET4 1.43*   Anemia Panel: No results for input(s): VITAMINB12, FOLATE, FERRITIN, TIBC, IRON , RETICCTPCT in the last 72 hours. Urine analysis:    Component Value Date/Time   COLORURINE YELLOW 12/05/2023 1720   APPEARANCEUR CLEAR 12/05/2023 1720   LABSPEC >1.046 (H) 12/05/2023 1720   PHURINE 5.0 12/05/2023 1720   GLUCOSEU NEGATIVE 12/05/2023 1720   HGBUR MODERATE (A) 12/05/2023 1720   BILIRUBINUR NEGATIVE 12/05/2023 1720   BILIRUBINUR negative 09/20/2023 1638   BILIRUBINUR negative 09/14/2021 0839   KETONESUR NEGATIVE 12/05/2023 1720   PROTEINUR 30 (A) 12/05/2023 1720   UROBILINOGEN 0.2 09/20/2023 1638   NITRITE NEGATIVE 12/05/2023 1720   LEUKOCYTESUR NEGATIVE 12/05/2023 1720   Sepsis Labs: @LABRCNTIP (procalcitonin:4,lacticidven:4)  ) Recent Results (from the past 240 hours)  Culture, blood (routine x 2)     Status: None (Preliminary result)   Collection Time: 12/04/23 11:47 AM   Specimen: BLOOD  Result Value Ref Range Status   Specimen Description   Final    BLOOD LEFT ANTECUBITAL Performed at [facility], [address] [place], [place] [address]    Special Requests   Final    BOTTLES DRAWN AEROBIC AND ANAEROBIC Blood Culture adequate volume Performed at [facility], [address], [place], [place] [address]    Culture   Final    NO GROWTH 3 DAYS Performed at [facility], [address], [place], [place] [address]    Report Status PENDING  Incomplete   Aerobic/Anaerobic Culture w Gram Stain (surgical/deep wound)     Status: None (Preliminary result)   Collection Time: 12/04/23  6:14 PM   Specimen: Wound; Body Fluid  Result Value Ref Range Status   Specimen Description WOUND  Final   Special Requests NONE  Final   Gram Stain   Final    RARE WBC PRESENT, PREDOMINANTLY PMN NO ORGANISMS SEEN Performed at [facility], [address] [place], [place] [address]    Culture   Final    FEW GROUP B STREP(S.AGALACTIAE)ISOLATED TESTING AGAINST S. AGALACTIAE NOT ROUTINELY PERFORMED DUE TO PREDICTABILITY OF AMP/PEN/VAN SUSCEPTIBILITY. NO ANAEROBES ISOLATED; CULTURE IN PROGRESS FOR 5 DAYS    Report Status PENDING  Incomplete     Radiology Studies: ECHOCARDIOGRAM COMPLETE Result Date: 12/06/2023    ECHOCARDIOGRAM REPORT   Patient Name:   ERLEAN  MECHETTE Wurtz Date of Exam: 12/06/2023 Medical Rec #:  [id number]                 Height:       65.0 in Accession #:    [id number]                Weight:       294.5 lb Date of Birth:  [birth year]                 BSA:          2.332 m Patient Age:    48 years                  BP:           115/67 mmHg Patient Gender: F                         HR:           100 bpm. Exam Location:  Inpatient Procedure: 2D Echo, Cardiac Doppler and Color Doppler (Both Spectral and Color            Flow Doppler were utilized during procedure). Indications:    CHF  History:        Patient has prior history of Echocardiogram examinations, most                 recent 08/10/2023. CHF, CAD, Signs/Symptoms:Edema; Risk                 Factors:Hypertension, Dyslipidemia and Non-Smoker.  Sonographer:    Juliene Rucks Sonographer#2:  Ellouise Mose RDCS Referring Phys: [id number] LAWRENCE A BASS IMPRESSIONS  1. Left ventricular ejection fraction, by estimation, is 55%. The left ventricle has normal function. Left ventricular endocardial border not optimally defined to evaluate regional wall motion. Left ventricular diastolic parameters are  consistent with Grade II diastolic dysfunction (pseudonormalization).  2. D-shaped interventricular septum suggestive of RV pressure/volume overload. Right ventricular systolic function is mildly reduced. The right ventricular size is moderately enlarged. There is moderately elevated pulmonary artery systolic pressure. The  estimated right ventricular systolic pressure is 49.1 mmHg.  3. The mitral valve is normal in structure. No evidence of mitral valve regurgitation. No evidence of mitral stenosis.  4. The aortic valve is tricuspid. Aortic valve regurgitation is not visualized. No aortic stenosis is present.  5. The inferior vena cava is dilated in size with <50% respiratory variability, suggesting right atrial pressure of 15 mmHg.  6. RV dysfunction, would rule out PE. FINDINGS  Left Ventricle: Left ventricular ejection fraction, by estimation, is 55%. The left ventricle has normal function. Left ventricular endocardial border not optimally defined to evaluate regional wall motion. The left ventricular internal cavity size was normal in size. There is no left ventricular hypertrophy. Left ventricular diastolic parameters are consistent with Grade II diastolic dysfunction (pseudonormalization). Right Ventricle: D-shaped interventricular septum suggestive of RV pressure/volume overload. The right ventricular size is moderately enlarged. No increase in right ventricular wall thickness. Right ventricular systolic function is mildly reduced. There is moderately elevated pulmonary artery systolic pressure. The tricuspid regurgitant velocity is 2.92 m/s, and with an assumed right atrial pressure of 15 mmHg, the estimated right ventricular systolic pressure is 49.1 mmHg. Left Atrium: Left atrial size was normal in size. Right Atrium: Right atrial size was normal in size. Pericardium: Trivial pericardial effusion is present. Mitral Valve: The mitral valve is normal in structure. No evidence  of mitral valve regurgitation.  No evidence of mitral valve stenosis. Tricuspid Valve: The tricuspid valve is normal in structure. Tricuspid valve regurgitation is trivial. Aortic Valve: The aortic valve is tricuspid. Aortic valve regurgitation is not visualized. No aortic stenosis is present. Aortic valve mean gradient measures 6.0 mmHg. Aortic valve peak gradient measures 12.5 mmHg. Aortic valve area, by VTI measures 3.23  cm. Pulmonic Valve: The pulmonic valve was normal in structure. Pulmonic valve regurgitation is not visualized. Aorta: The aortic root is normal in size and structure. Venous: The inferior vena cava is dilated in size with less than 50% respiratory variability, suggesting right atrial pressure of 15 mmHg. IAS/Shunts: No atrial level shunt detected by color flow Doppler.  LEFT VENTRICLE PLAX 2D LVIDd:         5.40 cm      Diastology LVIDs:         3.60 cm      LV e' medial:    7.62 cm/s LV PW:         1.00 cm      LV E/e' medial:  12.3 LV IVS:        1.00 cm      LV e' lateral:   15.20 cm/s LVOT diam:     2.10 cm      LV E/e' lateral: 6.1 LV SV:         85 LV SV Index:   36 LVOT Area:     3.46 cm  LV Volumes (MOD) LV vol d, MOD A2C: 120.0 ml LV vol d, MOD A4C: 114.0 ml LV vol s, MOD A2C: 47.8 ml LV vol s, MOD A4C: 45.5 ml LV SV MOD A2C:     72.2 ml LV SV MOD A4C:     114.0 ml LV SV MOD BP:      76.1 ml RIGHT VENTRICLE            IVC RV S prime:     9.64 cm/s  IVC diam: 2.30 cm TAPSE (M-mode): 1.4 cm LEFT ATRIUM             Index        RIGHT ATRIUM           Index LA diam:        3.60 cm 1.54 cm/m   RA Area:     17.80 cm LA Vol (A2C):   36.9 ml 15.82 ml/m  RA Volume:   48.50 ml  20.80 ml/m LA Vol (A4C):   42.3 ml 18.14 ml/m LA Biplane Vol: 40.0 ml 17.15 ml/m  AORTIC VALVE AV Area (Vmax):    2.86 cm AV Area (Vmean):   2.81 cm AV Area (VTI):     3.23 cm AV Vmax:           177.00 cm/s AV Vmean:          115.000 cm/s AV VTI:            0.263 m AV Peak Grad:      12.5 mmHg AV Mean Grad:      6.0 mmHg LVOT Vmax:          146.00 cm/s LVOT Vmean:        93.300 cm/s LVOT VTI:          0.245 m LVOT/AV VTI ratio: 0.93  AORTA Ao Root diam: 3.10 cm Ao Asc diam:  2.50 cm MITRAL VALVE  TRICUSPID VALVE MV Area (PHT): 4.49 cm    TR Peak grad:   34.1 mmHg MV Decel Time: 169 msec    TR Vmax:        292.00 cm/s MV E velocity: 93.40 cm/s MV A velocity: 75.70 cm/s  SHUNTS MV E/A ratio:  1.23        Systemic VTI:  0.24 m                            Systemic Diam: 2.10 cm Dalton McleanMD Electronically signed by Ezra Kanner Signature Date/Time: 12/06/2023/3:47:53 PM    Final    CT Angio Chest Pulmonary Embolism (PE) W or WO Contrast Result Date: 12/05/2023 CLINICAL DATA:  Pulmonary embolus suspected with high probability. EXAM: CT ANGIOGRAPHY CHEST WITH CONTRAST TECHNIQUE: Multidetector CT imaging of the chest was performed using the standard protocol during bolus administration of intravenous contrast. Multiplanar CT image reconstructions and MIPs were obtained to evaluate the vascular anatomy. RADIATION DOSE REDUCTION: This exam was performed according to the departmental dose-optimization program which includes automated exposure control, adjustment of the mA and/or kV according to patient size and/or use of iterative reconstruction technique. CONTRAST:  OMNIPAQUE  IOHEXOL  350 MG/ML SOLN COMPARISON:  Chest radiograph 12/05/2023.  CT chest 12/08/2022 FINDINGS: Cardiovascular: Examination is technically indeterminate for evaluation of the pulmonary arteries for possible pulmonary embolus. Poor contrast bolus despite reinjection attempt. As visualized, no large central pulmonary artery emboli are identified but significant embolus could be obscured due to poor technique. Cardiac enlargement. No pericardial effusions. Normal caliber thoracic aorta. Mild aortic calcification. Mediastinum/Nodes: No enlarged mediastinal, hilar, or axillary lymph nodes. Thyroid gland, trachea, and esophagus demonstrate no significant findings.  Lungs/Pleura: Motion artifact limits evaluation. Shallow inspiration. Small left pleural effusion. Atelectasis or consolidation in both lung bases. This could indicate compressive atelectasis or pneumonia. Aspiration would be a secondary consideration. No pleural effusion or pneumothorax. Upper Abdomen: No acute abnormalities demonstrated. Musculoskeletal: Degenerative changes in the spine. No acute bony abnormalities. Review of the MIP images confirms the above findings. IMPRESSION: 1. Technically indeterminate examination for evaluation of the pulmonary arteries due to poor contrast bolus despite reinjection. This may be related to heart failure. 2. Cardiac enlargement. 3. Right pleural effusion. 4. Bilateral basilar are atelectasis or consolidation. Electronically Signed   By: Elsie Gravely M.D.   On: 12/05/2023 15:58     Scheduled Meds:  chewing gum (ORBIT) sugar free  1 Stick Oral QID   Chlorhexidine  Gluconate Cloth  6 each Topical Daily   enoxaparin  (LOVENOX ) injection  60 mg Subcutaneous Q24H   famotidine   20 mg Oral BID   furosemide   40 mg Intravenous Q12H   insulin  aspart  0-15 Units Subcutaneous TID WC   pantoprazole  (PROTONIX ) IV  40 mg Intravenous Q12H   polyethylene glycol  17 g Oral QODAY   potassium chloride   40 mEq Oral BID   prenatal multivitamin  1 tablet Oral Q1200   simethicone   80 mg Oral TID   Continuous Infusions:  ceFEPime  (MAXIPIME ) IV Stopped (12/07/23 0538)   vancomycin  750 mg (12/07/23 0617)     LOS: 3 days    Time spent:    Sigurd Pac, MD [facility]   12/07/2023, 10:43 AM

## 2023-12-07 NOTE — Progress Notes (Signed)
 Occupational Therapy Treatment Patient Details Name: Amanda Paul MRN: [id number] DOB: Nov 13, 1975 Today's Date: 12/07/2023   History of present illness Pt is 48 year old presented to [facility] on  12/04/23 for acute abd pain. Pt transferred to [facility] with uterine rupture of necrotic fibroid with chemical peritonitis. Pt underwent emergent total abdominal hysterectomy and bil salpingo-oopherectomy. PMH - uterine fibroids, htn, obesity,   OT comments  Pt presented in chair and reported they had to have HOB elevated to be able to get OOB. Pt reported with the return to home they do not have a recliner to sleep in and they have a standard mattress. She also reported she lives alone with only friends to assist with going to md visits and assist with groceries but can not assist at all times. At this time mod assist with LE ADLS due pain but was educated about to use AE. Pt only was able to complete sit to stand transfers with increase in time with CGA but only ambulate then to ambulate to bed today. At this time educated about SNF vs HH due to support.       If plan is discharge home, recommend the following:  A little help with walking and/or transfers;A little help with bathing/dressing/bathroom;Assist for transportation;Help with stairs or ramp for entrance   Equipment Recommendations   (RW vs 4ww)    Recommendations for Other Services      Precautions / Restrictions Precautions Precautions: Fall;Other (comment) Recall of Precautions/Restrictions: Intact Precaution/Restrictions Comments: abd protection precs Required Braces or Orthoses: Other Brace Other Brace: abd binder for comfort Restrictions Weight Bearing Restrictions Per Provider Order: No       Mobility Bed Mobility Overal bed mobility: Needs Assistance Bed Mobility: Sit to Supine     Supine to sit: Contact guard     General bed mobility comments: Pt presented in chair and reported that they had to have te HOB  elevated and does not have a recliner to sleep in at home    Transfers Overall transfer level: Needs assistance Equipment used: Rollator (4 wheels) Transfers: Sit to/from Stand Sit to Stand: Contact guard assist           General transfer comment: increase in time and cues on positioning     Balance Overall balance assessment: Needs assistance Sitting-balance support: Feet supported Sitting balance-Leahy Scale: Good     Standing balance support: Bilateral upper extremity supported, No upper extremity supported Standing balance-Leahy Scale: Fair Standing balance comment: able to stand unsupported to adjust binder but when mobilizing needed walker                           ADL either performed or assessed with clinical judgement   ADL Overall ADL's : Needs assistance/impaired Eating/Feeding: Independent   Grooming: Set up;Sitting   Upper Body Bathing: Set up;Sitting   Lower Body Bathing: Moderate assistance;Cueing for sequencing;Cueing for safety   Upper Body Dressing : Set up;Sitting   Lower Body Dressing: Moderate assistance;Sit to/from stand   Toilet Transfer: Contact guard assist;Rollator (4 wheels)           Functional mobility during ADLs: Contact guard assist;Rollator (4 wheels)      Extremity/Trunk Assessment Upper Extremity Assessment Upper Extremity Assessment: Overall WFL for tasks assessed   Lower Extremity Assessment Lower Extremity Assessment: Defer to PT evaluation        Vision       Perception  Praxis     Communication Communication Communication: No apparent difficulties   Cognition Arousal: Alert Behavior During Therapy: WFL for tasks assessed/performed Cognition: No apparent impairments                               Following commands: Intact        Cueing   Cueing Techniques: Verbal cues, Gestural cues  Exercises      Shoulder Instructions       General Comments      Pertinent  Vitals/ Pain       Pain Assessment Pain Assessment: Faces Faces Pain Scale: Hurts whole lot Pain Location: abdomen with mobility, esp STS to/from lower surface heights Pain Descriptors / Indicators: Grimacing, Guarding Pain Intervention(s): Limited activity within patient's tolerance, Monitored during session, Repositioned  Home Living                                          Prior Functioning/Environment              Frequency  Min 2X/week        Progress Toward Goals  OT Goals(current goals can now be found in the care plan section)  Progress towards OT goals: Progressing toward goals  Acute Rehab OT Goals Patient Stated Goal: to have less pain OT Goal Formulation: With patient Time For Goal Achievement: 12/20/23 Potential to Achieve Goals: Good ADL Goals Pt Will Perform Upper Body Dressing: with modified independence Pt Will Perform Lower Body Dressing: with modified independence;with adaptive equipment;sit to/from stand Pt Will Transfer to Toilet: with modified independence Pt Will Perform Toileting - Clothing Manipulation and hygiene: with modified independence  Plan      Co-evaluation                 AM-PAC OT 6 Clicks Daily Activity     Outcome Measure   Help from another person eating meals?: None Help from another person taking care of personal grooming?: A Little Help from another person toileting, which includes using toliet, bedpan, or urinal?: A Little Help from another person bathing (including washing, rinsing, drying)?: A Lot Help from another person to put on and taking off regular upper body clothing?: A Little Help from another person to put on and taking off regular lower body clothing?: A Lot 6 Click Score: 17    End of Session Equipment Utilized During Treatment: Gait belt;Rollator (4 wheels)  OT Visit Diagnosis: Unsteadiness on feet (R26.81);Other abnormalities of gait and mobility (R26.89);Pain Pain - part of  body:  (abdomen)   Activity Tolerance No increased pain   Patient Left with call bell/phone within reach;in bed;with bed alarm set   Nurse Communication Mobility status        Time: 8961-8857 OT Time Calculation (min): 64 min  Charges: OT General Charges $OT Visit: 1 Visit OT Treatments $Self Care/Home Management : 53-67 mins  Warrick POUR OTR/L  Acute Rehab Services  [phone] office number   Warrick Berber 12/07/2023, 11:47 AM

## 2023-12-07 NOTE — Progress Notes (Addendum)
 Physical Therapy Treatment Patient Details Name: Amanda Paul MRN: [id number] DOB: 29-Oct-1975 Today's Date: 12/07/2023   History of Present Illness Pt is 48 year old presented to [facility] on  12/04/23 for acute abd pain. Pt transferred to [facility] with uterine rupture of necrotic fibroid with chemical peritonitis. Pt underwent emergent total abdominal hysterectomy and bil salpingo-oopherectomy. PMH - uterine fibroids, htn, obesity,    PT Comments  Pt received in supine, c/o feeling unwell compared with previous date. Pt needing up to CGA for sit<>stand but not able to attempt until bed height significantly elevated due to c/o pain and needing >10 mins after transfer to edge of bed before she was able to attempt sit<>stand trial due to c/o discomfort and needing time to prepare. Pt hypoxic on RA with minimal exertion, needed 2L/min O2 Marvin to maintain SpO2 90% and greater. Pt will need to continue aggressive OOB mobility with therapy, mobility and nursing staff to return to prior level of function as she remains below baseline. BP stable with orthostatic BP assessment. Pt continues to benefit from PT services to progress toward functional mobility goals.     If plan is discharge home, recommend the following: Assist for transportation;Assistance with cooking/housework;A little help with walking and/or transfers;Help with stairs or ramp for entrance   Can travel by private vehicle        Equipment Recommendations  Rollator (4 wheels);Hospital bed    Recommendations for Other Services       Precautions / Restrictions Precautions Precautions: Fall;Other (comment) Recall of Precautions/Restrictions: Intact Precaution/Restrictions Comments: abd protection precs, watch O2 Required Braces or Orthoses: Other Brace Other Brace: abd binder for comfort Restrictions Weight Bearing Restrictions Per Provider Order: No     Mobility  Bed Mobility Overal bed mobility: Needs Assistance Bed Mobility:  Supine to Sit     Supine to sit: Contact guard, HOB elevated, Used rails     General bed mobility comments: Pt requesting HOB >50 deg and heavy reliance on rail, increased time to perform, >5 minutes.    Transfers Overall transfer level: Needs assistance Equipment used:  (back of chair in front of pt for support upon standing while BP checked, pt defers rollator due to fatigue/feeling unwell not able to ambulate after STS/BP check) Transfers: Sit to/from Stand Sit to Stand: Contact guard assist, From elevated surface           General transfer comment: Pt needing >10 mins to prepare to stand after sitting EOB and bed height elevated 3-4 inches before being able to stand with CGA, pt using BUE to push up/stabilize upon standing.    Ambulation/Gait               General Gait Details: pt defers ambulation due to c/o discomfort/fatigue after sitting EOB, also defers step pivot to chair.   Stairs Stairs:  (pt reports not feeling well enough today to attempt.)           Wheelchair Mobility     Tilt Bed    Modified Rankin (Stroke Patients Only)       Balance Overall balance assessment: Needs assistance Sitting-balance support: Feet supported Sitting balance-Leahy Scale: Good     Standing balance support: No upper extremity supported Standing balance-Leahy Scale: Poor Standing balance comment: appears reliant on single UE support for static standing today                            Communication  Communication Communication: No apparent difficulties  Cognition Arousal: Alert Behavior During Therapy: WFL for tasks assessed/performed   PT - Cognitive impairments: Sequencing                       PT - Cognition Comments: very slow to initiate this date. pt needing >10 mins sitting EOB to prepare for sit>stand trial. Following commands: Intact      Cueing Cueing Techniques: Verbal cues, Gestural cues  Exercises Other Exercises Other  Exercises: cues for use of IS, pt performs >5 reps and achieves up to 500 mL. Encouraged hourly use    General Comments General comments (skin integrity, edema, etc.): SpO2 hypoxic to 75% with trial on RA sitting EOB (pt requested to try no ), once returned to 2L/min, SpO2 improves to >90% within 30 seconds. Per discussion with admissions coordinators, pt unlikely to obtain insurance approval based on current level of mobility, will need to progress mobility acutely to prepare for home.   Vital Signs  Patient Position (if appropriate) Orthostatic Vitals  Orthostatic Lying   BP- Lying 128/72  Pulse- Lying 99  Orthostatic Sitting  BP- Sitting 120/79  Pulse- Sitting 105  Orthostatic Standing at 0 minutes  BP- Standing at 0 minutes (!) 129/91  Pulse- Standing at 0 minutes 115     Pertinent Vitals/Pain Pain Assessment Pain Assessment: Faces Faces Pain Scale: Hurts even more Pain Location: abdomen with mobility, esp STS to/from lower surface heights Pain Descriptors / Indicators: Grimacing, Guarding Pain Intervention(s): Limited activity within patient's tolerance, Monitored during session, Repositioned    Home Living                          Prior Function            PT Goals (current goals can now be found in the care plan section) Acute Rehab PT Goals Patient Stated Goal: To return to prior level of function PT Goal Formulation: With patient Time For Goal Achievement: 12/19/23 Progress towards PT goals: Progressing toward goals (slowly)    Frequency    Min 3X/week      PT Plan      Co-evaluation              AM-PAC PT 6 Clicks Mobility   Outcome Measure  Help needed turning from your back to your side while in a flat bed without using bedrails?: A Little Help needed moving from lying on your back to sitting on the side of a flat bed without using bedrails?: A Lot (w/o rail) Help needed moving to and from a bed to a chair (including a  wheelchair)?: A Little Help needed standing up from a chair using your arms (e.g., wheelchair or bedside chair)?: A Little Help needed to walk in hospital room?: Total Help needed climbing 3-5 steps with a railing? : Total 6 Click Score: 13    End of Session Equipment Utilized During Treatment: Oxygen;Other (comment) (pt requesting to maintain abdominal binder for comfort while transferring to EOB and sitting EOB) Activity Tolerance: Patient limited by fatigue Patient left: with call bell/phone within reach;with bed alarm set;Other (comment) (pt requesting to sit edge of bed, defers to sit in chair, RN notified pt sitting up and bed alarm on for safety, pt A&O) Nurse Communication: Mobility status;Other (comment) (pt requesting to sit EOB for a while, denies dizziness or severe pain while sitting.) PT Visit Diagnosis: Unsteadiness on feet (R26.81);Other abnormalities  of gait and mobility (R26.89);Muscle weakness (generalized) (M62.81);Pain     Time: 8552-8483 PT Time Calculation (min) (ACUTE ONLY): 29 min  Charges:    $Therapeutic Activity: 23-37 mins PT General Charges $$ ACUTE PT VISIT: 1 Visit                     Nikya Busler P., PTA Acute Rehabilitation Services Secure Chat Preferred 9a-5:30pm Office: [phone]    Connell HERO [facility] 12/07/2023, 4:10 PM

## 2023-12-07 NOTE — Plan of Care (Signed)

## 2023-12-08 ENCOUNTER — Inpatient Hospital Stay (HOSPITAL_COMMUNITY)

## 2023-12-08 ENCOUNTER — Other Ambulatory Visit: Payer: Self-pay | Admitting: Internal Medicine

## 2023-12-08 DIAGNOSIS — J9601 Acute respiratory failure with hypoxia: Secondary | ICD-10-CM | POA: Diagnosis not present

## 2023-12-08 LAB — TYPE AND SCREEN
ABO/RH(D): B POS
Antibody Screen: NEGATIVE
Unit division: 0
Unit division: 0
Unit division: 0
Unit division: 0

## 2023-12-08 LAB — BPAM RBC
Blood Product Expiration Date: 202509162359
Blood Product Expiration Date: 202509192359
Blood Product Expiration Date: 202509192359
Blood Product Expiration Date: 202509192359
Unit Type and Rh: 1700
Unit Type and Rh: 1700
Unit Type and Rh: 1700
Unit Type and Rh: 1700

## 2023-12-08 LAB — COMPREHENSIVE METABOLIC PANEL WITH GFR
ALT: 12 U/L (ref 0–44)
AST: 12 U/L — ABNORMAL LOW (ref 15–41)
Albumin: 2.4 g/dL — ABNORMAL LOW (ref 3.5–5.0)
Alkaline Phosphatase: 52 U/L (ref 38–126)
Anion gap: 12 (ref 5–15)
BUN: 7 mg/dL (ref 6–20)
CO2: 30 mmol/L (ref 22–32)
Calcium: 9 mg/dL (ref 8.9–10.3)
Chloride: 96 mmol/L — ABNORMAL LOW (ref 98–111)
Creatinine, Ser: 0.57 mg/dL (ref 0.44–1.00)
GFR, Estimated: >60 mL/min (ref >60)
Glucose, Bld: 100 mg/dL — ABNORMAL HIGH (ref 70–99)
Potassium: 3.2 mmol/L — ABNORMAL LOW (ref 3.5–5.1)
Sodium: 138 mmol/L (ref 135–145)
Total Bilirubin: 0.7 mg/dL (ref 0.0–1.2)
Total Protein: 6.6 g/dL (ref 6.5–8.1)

## 2023-12-08 LAB — GLUCOSE, CAPILLARY
Glucose-Capillary: 99 mg/dL (ref 70–99)
Glucose-Capillary: 103 mg/dL — ABNORMAL HIGH (ref 70–99)
Glucose-Capillary: 97 mg/dL (ref 70–99)
Glucose-Capillary: 99 mg/dL (ref 70–99)

## 2023-12-08 LAB — CBC
HCT: 30.3 % — ABNORMAL LOW (ref 36.0–46.0)
Hemoglobin: 9.6 g/dL — ABNORMAL LOW (ref 12.0–15.0)
MCH: 25.9 pg — ABNORMAL LOW (ref 26.0–34.0)
MCHC: 31.7 g/dL (ref 30.0–36.0)
MCV: 81.7 fL (ref 80.0–100.0)
Platelets: 405 K/uL — ABNORMAL HIGH (ref 150–400)
RBC: 3.71 MIL/uL — ABNORMAL LOW (ref 3.87–5.11)
RDW: 16.5 % — ABNORMAL HIGH (ref 11.5–15.5)
WBC: 18.2 K/uL — ABNORMAL HIGH (ref 4.0–10.5)
nRBC: 0.1 % (ref 0.0–0.2)

## 2023-12-08 MED ORDER — LACTULOSE 10 GM/15ML PO SOLN
20.0000 g | Freq: Two times a day (BID) | ORAL | Status: DC
  Administered 2023-12-08 – 2023-12-15 (×5): 20 g via ORAL
  Filled 2023-12-08 (×16): qty 30

## 2023-12-08 MED ORDER — ENSURE PLUS HIGH PROTEIN PO LIQD
237.0000 mL | Freq: Two times a day (BID) | ORAL | Status: DC
  Administered 2023-12-09 – 2023-12-17 (×16): 237 mL via ORAL

## 2023-12-08 MED ORDER — POTASSIUM CHLORIDE CRYS ER 20 MEQ PO TBCR
40.0000 meq | EXTENDED_RELEASE_TABLET | Freq: Two times a day (BID) | ORAL | Status: DC
  Administered 2023-12-08 (×2): 40 meq via ORAL
  Filled 2023-12-08 (×2): qty 2

## 2023-12-08 MED ORDER — FUROSEMIDE 10 MG/ML IJ SOLN
40.0000 mg | Freq: Every day | INTRAMUSCULAR | Status: DC
  Administered 2023-12-08: 40 mg via INTRAVENOUS
  Filled 2023-12-08: qty 4

## 2023-12-08 NOTE — Progress Notes (Signed)
 PROGRESS NOTE    Amanda Paul  FMW:[id number] DOB: [birth year] DOA: 12/04/2023 PCP: Jarold Medici, MD  48/F with obesity, hypertension, uterine fibroids was admitted to [facility] service 8/24 for hysterectomy for necrotic fibroid> chemical peritonitis, postop developed hypoxia, workup noted below for pleural effusions. - TRH consulting, started on diuretics - Also noted to be febrile, concern for sepsis postop, currently on broad-spectrum antibiotics   Subjective: - Urinated a lot yesterday, swelling improving  Assessment and Plan:  Acute hypoxic respiratory failure Acute diastolic CHF - Primarily secondary to volume overload, fever, sepsis also contributing -2D echo with preserved EF, grade 2 diastolic dysfunction, mildly reduced RV - Improving with diuresis, 8.3 L negative yesterday, cut down Lasix  to daily, continue Aldactone  - increase activity, out of bed to chair  Sepsis, fever, leukocytosis - Suspect this is coming from her abdominal source, chemical peritonitis -sp hysterectomy, currently on vancomycin  and cefepime  - Blood cultures are negative, low clinical suspicion for pneumonia, clinically x-ray findings secondary to atelectasis and pleural effusions - Discontinue vancomycin , monitor  Necrotic uterine fibroid Uterine rupture, chemical peritonitis -sp total abdominal hysterectomy, BSO on 8/24  -antibiotics as above, she has a JP drain - Per GYN  Severe hypoalbuminemia - Albumin  is 1.7, likely worsened in the setting of above infection/sepsis, fatty liver noted on abdominal ultrasound in 12/24 - Definitely contributing to third spacing  Type 2 diabetes mellitus A1c is 6.9, CBGs are stable  DVT prophylaxis: Lovenox  Code Status: Full code Family Communication: None present Disposition Plan: Home pending improvement    Objective: Vitals:   12/08/23 0300 12/08/23 0433 12/08/23 0859 12/08/23 1153  BP:  138/82 135/79 121/82  Pulse:  99 98 95  Resp: 20 20  20 20   Temp:  98.8 F (37.1 C) 99.6 F (37.6 C) 99.8 F (37.7 C)  TempSrc:  Oral Oral Oral  SpO2:  94% 95% 98%    Intake/Output Summary (Last 24 hours) at 12/08/2023 1234 Last data filed at 12/08/2023 1200 Gross per 24 hour  Intake 150 ml  Output 3055 ml  Net -2905 ml   There were no vitals filed for this visit.  Examination:  General exam: Chronically ill-appearing, AAO x 3 Respiratory system: Few basilar rales Cardiovascular system: S1 & S2 heard, RRR.  Abd: nondistended, soft and nontender.Normal bowel sounds heard.,  JP drain Central nervous system: Alert and oriented. No focal neurological deficits. Extremities: no edema Skin: No rashes Psychiatry:  Mood & affect appropriate.     Data Reviewed:   CBC: Recent Labs  Lab 12/04/23 0927 12/04/23 1454 12/05/23 0420 12/05/23 2333 12/06/23 0416 12/07/23 0306 12/08/23 0358  WBC 8.4   < > 10.5 17.0* 16.8* 19.7* 18.2*  NEUTROABS 7.2  --   --   --   --   --   --   HGB 10.5*   < > 9.4* 8.8* 8.8* 9.2* 9.6*  HCT 35.9*   < > 30.7* 29.3* 28.9* 30.0* 30.3*  MCV 85.9   < > 83.4 83.2 83.3 83.1 81.7  PLT 467*   < > 359 361 371 368 405*   < > = values in this interval not displayed.   Basic Metabolic Panel: Recent Labs  Lab 12/04/23 0927 12/05/23 0420 12/05/23 1838 12/06/23 0416 12/07/23 0306 12/08/23 0358  NA 139 137  --  137 137 138  K 3.6 3.7  --  3.6 3.4* 3.2*  CL 101 105  --  102 99 96*  CO2 26 26  --  27 29 30   GLUCOSE 169* 122*  --  87 106* 100*  BUN 13 11  --  10 10 7   CREATININE 0.70 0.52  --  0.58 0.73 0.57  CALCIUM  8.9 8.1*  --  8.2* 8.4* 9.0  MG  --   --  2.0  --   --   --    GFR: Estimated Creatinine Clearance: 116.3 mL/min (by C-G formula based on SCr of 0.57 mg/dL). Liver Function Tests: Recent Labs  Lab 12/04/23 0927 12/05/23 0420 12/06/23 0416 12/07/23 0306 12/08/23 0358  AST 21 14* 13* 14* 12*  ALT 16 13 12 12 12   ALKPHOS 62 38 62 57 52  BILITOT 0.7 0.4 0.5 0.4 0.7  PROT 8.7* 6.2*  5.8* 5.8* 6.6  ALBUMIN  2.5* 2.0* 1.7* 1.7* 2.4*   Recent Labs  Lab 12/04/23 0927  LIPASE 31   No results for input(s): AMMONIA in the last 168 hours. Coagulation Profile: Recent Labs  Lab 12/04/23 1454  INR 1.2   Cardiac Enzymes: No results for input(s): CKTOTAL, CKMB, CKMBINDEX, TROPONINI in the last 168 hours. BNP (last 3 results) No results for input(s): PROBNP in the last 8760 hours. HbA1C: No results for input(s): HGBA1C in the last 72 hours.  CBG: Recent Labs  Lab 12/07/23 1255 12/07/23 1851 12/07/23 2116 12/08/23 0553 12/08/23 1147  GLUCAP 121* 92 91 99 103*   Lipid Profile: No results for input(s): CHOL, HDL, LDLCALC, TRIG, CHOLHDL, LDLDIRECT in the last 72 hours. Thyroid Function Tests: No results for input(s): TSH, T4TOTAL, FREET4, T3FREE, THYROIDAB in the last 72 hours.  Anemia Panel: No results for input(s): VITAMINB12, FOLATE, FERRITIN, TIBC, IRON , RETICCTPCT in the last 72 hours. Urine analysis:    Component Value Date/Time   COLORURINE YELLOW 12/05/2023 1720   APPEARANCEUR CLEAR 12/05/2023 1720   LABSPEC >1.046 (H) 12/05/2023 1720   PHURINE 5.0 12/05/2023 1720   GLUCOSEU NEGATIVE 12/05/2023 1720   HGBUR MODERATE (A) 12/05/2023 1720   BILIRUBINUR NEGATIVE 12/05/2023 1720   BILIRUBINUR negative 09/20/2023 1638   BILIRUBINUR negative 09/14/2021 0839   KETONESUR NEGATIVE 12/05/2023 1720   PROTEINUR 30 (A) 12/05/2023 1720   UROBILINOGEN 0.2 09/20/2023 1638   NITRITE NEGATIVE 12/05/2023 1720   LEUKOCYTESUR NEGATIVE 12/05/2023 1720   Sepsis Labs: @LABRCNTIP (procalcitonin:4,lacticidven:4)  ) Recent Results (from the past 240 hours)  Culture, blood (routine x 2)     Status: None (Preliminary result)   Collection Time: 12/04/23 11:47 AM   Specimen: BLOOD  Result Value Ref Range Status   Specimen Description   Final    BLOOD LEFT ANTECUBITAL Performed at [facility], [address] [place], [place] [address]    Special Requests   Final    BOTTLES DRAWN AEROBIC AND ANAEROBIC Blood Culture adequate volume Performed at [facility], [address], [place], [place] [address]    Culture   Final    NO GROWTH 3 DAYS Performed at [facility], [address] [place], [place] [address]    Report Status PENDING  Incomplete  Aerobic/Anaerobic Culture w Gram Stain (surgical/deep wound)     Status: None (Preliminary result)   Collection Time: 12/04/23  6:14 PM   Specimen: Wound; Body Fluid  Result Value Ref Range Status   Specimen Description WOUND  Final   Special Requests NONE  Final   Gram Stain   Final    RARE WBC PRESENT, PREDOMINANTLY PMN NO ORGANISMS SEEN Performed at [facility],  [address], [place], [place] [address]    Culture   Final    FEW GROUP B STREP(S.AGALACTIAE)ISOLATED TESTING AGAINST S. AGALACTIAE NOT ROUTINELY PERFORMED DUE TO PREDICTABILITY OF AMP/PEN/VAN SUSCEPTIBILITY. NO ANAEROBES ISOLATED; CULTURE IN PROGRESS FOR 5 DAYS    Report Status PENDING  Incomplete     Radiology Studies: DG CHEST PORT 1 VIEW Result Date: 12/08/2023 CLINICAL DATA:  200808 Hypoxia [id number] EXAM: PORTABLE CHEST - 1 VIEW COMPARISON:  December 05, 2023 FINDINGS: Low lung volumes with elevation of the right hemidiaphragm. Patchy bibasilar airspace opacities. Similar right pleural effusion. No pneumothorax. No cardiomegaly. Tortuous aorta with aortic atherosclerosis. No acute fracture or destructive lesions. Multilevel thoracic osteophytosis. IMPRESSION: Low lung volumes. Similar patchy airspace disease in both lung bases with small right pleural effusion. Electronically Signed   By: Rogelia Myers M.D.   On: 12/08/2023 11:19     Scheduled Meds:  chewing gum (ORBIT) sugar free  1 Stick Oral QID   Chlorhexidine  Gluconate Cloth  6 each Topical Daily   enoxaparin  (LOVENOX ) injection  60 mg Subcutaneous Q24H   famotidine   20 mg Oral  BID   furosemide   40 mg Intravenous Daily   insulin  aspart  0-15 Units Subcutaneous TID WC   lactulose   20 g Oral BID   pantoprazole   40 mg Oral Q1200   polyethylene glycol  17 g Oral QODAY   potassium chloride   40 mEq Oral BID   prenatal multivitamin  1 tablet Oral Q1200   simethicone   80 mg Oral TID   spironolactone   25 mg Oral Daily   Continuous Infusions:  ceFEPime  (MAXIPIME ) IV 2 g (12/08/23 0549)   vancomycin  750 mg (12/08/23 0626)     LOS: 4 days    Time spent:    Sigurd Pac, MD [facility]   12/08/2023, 12:34 PM

## 2023-12-08 NOTE — Progress Notes (Addendum)
 Gynecology Progress Note  Admission Date: 12/04/2023 Current Date: 12/08/2023 6:58 PM  Amanda Paul is a 48 y.o. G0P0000 POD4 s/p TAH/BSO for necrotic fibroid and uterine rupture remote from [place] in 2022.   History complicated by: Patient Active Problem List   Diagnosis Date Noted   DM2 (diabetes mellitus, type 2) (HCC) 12/06/2023   Sepsis (HCC) 12/06/2023   Acute hypoxic respiratory failure (HCC) 12/06/2023   CHF with unknown LVEF (HCC) 12/06/2023   Pneumonia 12/06/2023   Fibroid 12/04/2023   Persistent cough 11/20/2023   Peripheral edema 11/20/2023   Chronic pain of both ankles 10/01/2023   Allergic rhinitis with postnasal drip 10/01/2023   Class 3 severe obesity due to excess calories with serious comorbidity and body mass index (BMI) of 45.0 to 49.9 in adult 03/21/2023   Pure hypercholesterolemia 03/21/2023   Coronary artery calcification 03/21/2023   Enlarged liver 03/21/2023   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  Subjective:  She feels well this evening. She has no complaints. She denies N/V. She does not feel feverish. She reports very small bowel movement and some gas with it early today but none since. Appetite remains suppressed. Denies any pain. She is ambulatory to chair and bathroom.   Objective:    Current Vital Signs 24h Vital Sign Ranges  T 99.8 F (37.7 C) Temp  Avg: 99.3 F (37.4 C)  Min: 98.5 F (36.9 C)  Max: 99.8 F (37.7 C)  BP 135/77 BP  Min: 121/82  Max: 138/82  HR 100 Pulse  Avg: 97.8  Min: 94  Max: 101  RR 20 Resp  Avg: 19.4  Min: 18  Max: 20  SaO2 97 % Nasal Cannula SpO2  Avg: 95 %  Min: 92 %  Max: 98 %        24 Hour I/O Current Shift I/O  Time Ins Outs 08/27 0701 - 08/28 0700 In: 150 [P.O.:150] Out: 3915 [Urine:3750; Drains:165] 08/28 0701 - 08/28 1900 In: -  Out: 880 [Urine:800; Drains:80]     Patient Vitals for the past 24 hrs:  BP Temp Temp src Pulse Resp SpO2  12/08/23 1711 135/77 99.8 F (37.7 C) Oral 100 20 97 %  12/08/23 1153 121/82 99.8 F (37.7 C) Oral 95 20 98 %  12/08/23 0859 135/79 99.6 F (37.6 C) Oral 98 20 95 %  12/08/23 0433 138/82 98.8 F (37.1 C) Oral 99 20 94 %  12/08/23 0300 -- -- -- -- 20 --  12/08/23 0200 -- -- -- -- 20 --  12/08/23 0100 -- -- -- -- 18 --  12/08/23 0005 -- -- -- -- 18 --  12/07/23 2306 129/78 98.5 F (36.9 C) Oral 94 20 94 %  12/07/23 2300 -- -- -- -- 18 --  12/07/23 2200 -- -- -- -- 18 --  12/07/23 2100 -- -- -- -- 20 --  12/07/23 1915 129/77 99.5 F (37.5 C) Oral (!) 101 20 92 %    Physical exam: General appearance: alert, cooperative, and appears stated age Abdomen: soft, nttp, non-distended, c/d/I incision with honeycomb in place but serosanguinous fluid at the base. JP with 5-10 cc of serous fluid. BS quiet but present.  GU: No gross VB Lungs: Normal respiratory effort, lower lung bases with faint crackles Heart: S1, S2 normal, no murmur, rub or gallop, regular rate and rhythm Extremities: LE edema stable, non-pitting, SCDs on Skin: warm/dry Psych: appropriate Neurologic: Grossly normal  Labs  Recent Labs  Lab 12/06/23 0416 12/07/23 0306 12/08/23 0358  WBC 16.8* 19.7* 18.2*  HGB 8.8* 9.2* 9.6*  HCT 28.9* 30.0* 30.3*  PLT 371 368 405*    Recent Labs  Lab 12/06/23 0416 12/07/23 0306 12/08/23 0358  NA 137 137 138  K 3.6 3.4* 3.2*  CL 102 99 96*  CO2 27 29 30   BUN 10 10 7   CREATININE 0.58 0.73 0.57  CALCIUM  8.2* 8.4* 9.0  PROT 5.8* 5.8* 6.6  BILITOT 0.5 0.4 0.7  ALKPHOS 62 57 52  ALT 12 12 12   AST 13* 14* 12*  GLUCOSE 87 106* 100*    Assessment & Plan:  Patient status improving.  *GYN:  - Voiding  spontaneously.  - JP drain with minimal and normal output   *Cardiac:  - on tele - Lasix  and aldactone .  - Echo with diastolic dysfunction but normal EF. Currently heplocked.    *ID: - Vanc/Cefepime  for possible PNA although more likely reaction from the fluid.  - WBC decrease 18.2. Will continue to observe.  - No purulent fluid from JP which is reassuring.   *Heme: - Hgb stable at this time. If goes below 8 will consider transfusion.  - Will eventually start PO iron  every other day once bowel function has resumed.   *Pulm:  - Encouraged I/S - O2 is 2L currently and O2 sats good on this.   - Vanc d/c'd today. Continue Cefepime , but may be able to d/c tomorrow.  - Cultures have had no growth.   *Pain:  - Well controlled - using oxycodone  sparingly  *FEN/GI:  - Diet as tolerated.  - Encouraged continued high protein diet. Will add in Ensure.  - Chewing gum. PPI.  - Hypokalemia - repletion done today. D/t lasix .   *PPx:  - pLovenox. Continue SCDs.  - Encourage ambulation - she is working with PT/OT. OT expressed concern that she made need SNF. Will consult case management.   *Dispo: pending bowel function and other medical goals met  Code Status: Full Code  Vina Solian, MD Attending Center for [facility])

## 2023-12-08 NOTE — Progress Notes (Addendum)
 Physical Therapy Treatment Patient Details Name: Amanda Paul MRN: [id number] DOB: [birth year] Today's Date: 12/08/2023   History of Present Illness Pt is 48 year old presented to [facility] on  12/04/23 for acute abd pain. Pt transferred to [facility] with uterine rupture of necrotic fibroid with chemical peritonitis. Pt underwent emergent total abdominal hysterectomy and bil salpingo-oopherectomy. PMH - uterine fibroids, htn, obesity,    PT Comments  Pt received in supine, c/o fatigue after ambulating to bathroom earlier in the day, pt defers transfer to EOB or OOB due to fatigue but agreeable to instruction on supine LE exercises and verbal/visual demo for seated exercises to perform later once she feels ready to get up again. Pt receptive to instruction. Discussed with pt need to attempt bed mobility from flat bed without using rails next session as she does not have hospital bed at home, as well as stairs to prepare for DC and assess for post-acute rehab needs possibly, pt requests session earlier in the day tomorrow. Pt continues to benefit from PT services to progress toward functional mobility goals, disposition pending progress in next 1-2 sessions, pt was independent prior to admission and currently needs physical assist when she is not using hospital bed features.    If plan is discharge home, recommend the following: Assist for transportation;Assistance with cooking/housework;A little help with walking and/or transfers;Help with stairs or ramp for entrance   Can travel by private vehicle        Equipment Recommendations  Rollator (4 wheels);Hospital bed    Recommendations for Other Services       Precautions / Restrictions Precautions Precautions: Fall;Other (comment) Recall of Precautions/Restrictions: Intact Precaution/Restrictions Comments: abd protection precs, watch O2 Required Braces or Orthoses: Other Brace Other Brace: abd binder for comfort, pt defers today since not  getting to EOB/OOB Restrictions Weight Bearing Restrictions Per Provider Order: No     Mobility  Bed Mobility Overal bed mobility: Needs Assistance Bed Mobility: Supine to Sit           General bed mobility comments: pt defers today due to c/o fatigue after getting up to bathroom and to [facility] last night.    Transfers Overall transfer level: Needs assistance                 General transfer comment: pt defers    Ambulation/Gait               General Gait Details: pt defers   Stairs             Wheelchair Mobility     Tilt Bed    Modified Rankin (Stroke Patients Only)       Balance Overall balance assessment: Needs assistance Sitting-balance support: Feet supported   Sitting balance - Comments: pt defers                                    Communication Communication Communication: No apparent difficulties  Cognition Arousal: Alert Behavior During Therapy: WFL for tasks assessed/performed   PT - Cognitive impairments: No apparent impairments                       PT - Cognition Comments: good participation in supine HEP Following commands: Intact      Cueing Cueing Techniques: Verbal cues, Gestural cues  Exercises Other Exercises Other Exercises: encouraged 5-10 reps hourly while awake, pt reports compliance Other  Exercises: supine BLE AROM: ankle pumps, quad sets, SAQ, hip ab/adduction x3-10 reps, (SAQ only a few reps for teachback) Other Exercises: visual/verbal demo for seated scapular retraction, pursed-lip breathing and chair push-ups with focus on pushing heels into ground to activate glutes, but to hold on these if core too engaged/too much discomfort Other Exercises: discussed gait belt to use with calf stretch x30 sec a few times daily and PRN as a leg lifter HEP handout: Visit [email or web address] Access Code: [email or web address]   General Comments        Pertinent Vitals/Pain Pain  Assessment Pain Assessment: 0-10 Faces Pain Scale: Hurts little more Pain Location: abdomen at rest Pain Descriptors / Indicators: Grimacing, Guarding Pain Intervention(s): Limited activity within patient's tolerance, Monitored during session, Repositioned, Premedicated before session    Home Living                          Prior Function            PT Goals (current goals can now be found in the care plan section) Acute Rehab PT Goals Patient Stated Goal: To return to prior level of function PT Goal Formulation: With patient Time For Goal Achievement: 12/19/23 Progress towards PT goals: Progressing toward goals    Frequency    Min 3X/week      PT Plan      Co-evaluation              AM-PAC PT 6 Clicks Mobility   Outcome Measure  Help needed turning from your back to your side while in a flat bed without using bedrails?: A Little Help needed moving from lying on your back to sitting on the side of a flat bed without using bedrails?: A Lot (w/o rail) Help needed moving to and from a bed to a chair (including a wheelchair)?: A Little Help needed standing up from a chair using your arms (e.g., wheelchair or bedside chair)?: A Little Help needed to walk in hospital room?: Total (defers) Help needed climbing 3-5 steps with a railing? : Total 6 Click Score: 13    End of Session Equipment Utilized During Treatment: Oxygen Activity Tolerance: Patient limited by fatigue Patient left: with call bell/phone within reach;with bed alarm set;in bed;with SCD's reapplied Nurse Communication: Mobility status PT Visit Diagnosis: Unsteadiness on feet (R26.81);Other abnormalities of gait and mobility (R26.89);Muscle weakness (generalized) (M62.81);Pain Pain - part of body:  (abdomen)     Time: 8390-8367 PT Time Calculation (min) (ACUTE ONLY): 23 min  Charges:    $Therapeutic Exercise: 8-22 mins $Therapeutic Activity: 8-22 mins PT General Charges $$ ACUTE PT  VISIT: 1 Visit                     Caffie Sotto P., PTA Acute Rehabilitation Services Secure Chat Preferred 9a-5:30pm Office: [phone]    [facility] 12/08/2023, 5:12 PM

## 2023-12-09 ENCOUNTER — Inpatient Hospital Stay (HOSPITAL_COMMUNITY)

## 2023-12-09 DIAGNOSIS — J90 Pleural effusion, not elsewhere classified: Secondary | ICD-10-CM

## 2023-12-09 DIAGNOSIS — J9601 Acute respiratory failure with hypoxia: Secondary | ICD-10-CM | POA: Diagnosis not present

## 2023-12-09 DIAGNOSIS — D72829 Elevated white blood cell count, unspecified: Secondary | ICD-10-CM

## 2023-12-09 DIAGNOSIS — S3769XA Other injury of uterus, initial encounter: Secondary | ICD-10-CM

## 2023-12-09 DIAGNOSIS — K659 Peritonitis, unspecified: Secondary | ICD-10-CM

## 2023-12-09 LAB — CBC
HCT: 30.4 % — ABNORMAL LOW (ref 36.0–46.0)
Hemoglobin: 9.5 g/dL — ABNORMAL LOW (ref 12.0–15.0)
MCH: 25.3 pg — ABNORMAL LOW (ref 26.0–34.0)
MCHC: 31.3 g/dL (ref 30.0–36.0)
MCV: 80.9 fL (ref 80.0–100.0)
Platelets: 399 K/uL (ref 150–400)
RBC: 3.76 MIL/uL — ABNORMAL LOW (ref 3.87–5.11)
RDW: 16.5 % — ABNORMAL HIGH (ref 11.5–15.5)
WBC: 18.6 K/uL — ABNORMAL HIGH (ref 4.0–10.5)
nRBC: 0.2 % (ref 0.0–0.2)

## 2023-12-09 LAB — CULTURE, BLOOD (ROUTINE X 2)
Culture: NO GROWTH
Special Requests: ADEQUATE

## 2023-12-09 LAB — COMPREHENSIVE METABOLIC PANEL WITH GFR
ALT: 14 U/L (ref 0–44)
AST: 16 U/L (ref 15–41)
Albumin: 2 g/dL — ABNORMAL LOW (ref 3.5–5.0)
Alkaline Phosphatase: 50 U/L (ref 38–126)
Anion gap: 12 (ref 5–15)
BUN: 8 mg/dL (ref 6–20)
CO2: 29 mmol/L (ref 22–32)
Calcium: 8.9 mg/dL (ref 8.9–10.3)
Chloride: 98 mmol/L (ref 98–111)
Creatinine, Ser: 0.45 mg/dL (ref 0.44–1.00)
GFR, Estimated: >60 mL/min (ref >60)
Glucose, Bld: 102 mg/dL — ABNORMAL HIGH (ref 70–99)
Potassium: 3.8 mmol/L (ref 3.5–5.1)
Sodium: 139 mmol/L (ref 135–145)
Total Bilirubin: 0.6 mg/dL (ref 0.0–1.2)
Total Protein: 6.2 g/dL — ABNORMAL LOW (ref 6.5–8.1)

## 2023-12-09 LAB — AEROBIC/ANAEROBIC CULTURE W GRAM STAIN (SURGICAL/DEEP WOUND): Culture: NO GROWTH

## 2023-12-09 LAB — GLUCOSE, CAPILLARY
Glucose-Capillary: 99 mg/dL (ref 70–99)
Glucose-Capillary: 125 mg/dL — ABNORMAL HIGH (ref 70–99)
Glucose-Capillary: 112 mg/dL — ABNORMAL HIGH (ref 70–99)
Glucose-Capillary: 114 mg/dL — ABNORMAL HIGH (ref 70–99)

## 2023-12-09 LAB — SURGICAL PATHOLOGY

## 2023-12-09 MED ORDER — IOHEXOL 350 MG/ML SOLN
75.0000 mL | Freq: Once | INTRAVENOUS | Status: AC | PRN
  Administered 2023-12-09: 75 mL via INTRAVENOUS

## 2023-12-09 MED ORDER — CEFAZOLIN SODIUM-DEXTROSE 2-4 GM/100ML-% IV SOLN
2.0000 g | Freq: Three times a day (TID) | INTRAVENOUS | Status: DC
  Administered 2023-12-09 – 2023-12-16 (×21): 2 g via INTRAVENOUS
  Filled 2023-12-09 (×21): qty 100

## 2023-12-09 MED ORDER — METRONIDAZOLE 500 MG PO TABS
500.0000 mg | ORAL_TABLET | Freq: Two times a day (BID) | ORAL | Status: DC
  Administered 2023-12-09 – 2023-12-17 (×17): 500 mg via ORAL
  Filled 2023-12-09 (×17): qty 1

## 2023-12-09 MED ORDER — CEFUROXIME AXETIL 250 MG PO TABS: 500.0000 mg | ORAL_TABLET | Freq: Two times a day (BID) | ORAL | Status: DC

## 2023-12-09 MED ORDER — POTASSIUM CHLORIDE CRYS ER 20 MEQ PO TBCR
40.0000 meq | EXTENDED_RELEASE_TABLET | Freq: Once | ORAL | Status: AC
  Administered 2023-12-09: 40 meq via ORAL
  Filled 2023-12-09: qty 2

## 2023-12-09 MED ORDER — TORSEMIDE 20 MG PO TABS
40.0000 mg | ORAL_TABLET | Freq: Every day | ORAL | Status: DC
  Administered 2023-12-09 – 2023-12-17 (×9): 40 mg via ORAL
  Filled 2023-12-09 (×9): qty 2

## 2023-12-09 NOTE — Progress Notes (Signed)
 PROGRESS NOTE    Amanda Paul  FMW:[id number] DOB: [birth year] DOA: 12/04/2023 PCP: Jarold Medici, MD  48/F with obesity, hypertension, uterine fibroids was admitted to [facility] service 8/24 for hysterectomy for necrotic fibroid> chemical peritonitis, postop developed hypoxia, workup noted below for pleural effusions. - TRH consulting, started on diuretics - Also noted to be febrile, concern for sepsis postop, currently on broad-spectrum antibiotics   Subjective: - Better overall, breathing continues to improve  Assessment and Plan:  Acute hypoxic respiratory failure Acute diastolic CHF - Primarily secondary to volume overload, fever, sepsis also contributing -2D echo with preserved EF, grade 2 diastolic dysfunction, mildly reduced RV - Improving with diuresis, 9.2 L negative - Switch to oral torsemide , continue Aldactone  - increase activity, out of bed to chair - Discharge planning  Sepsis, fever, leukocytosis - Suspect this is coming from her abdominal source, chemical peritonitis -sp hysterectomy, currently on vancomycin  and cefepime  - Blood cultures are negative, low clinical suspicion for pneumonia, clinically x-ray findings secondary to atelectasis and pleural effusions - Off vancomycin , discontinue cefepime , changed to oral cefuroxime  -OR cultures with group B strep - Monitor abdominal site  Necrotic uterine fibroid Uterine rupture, chemical peritonitis -sp total abdominal hysterectomy, BSO on 8/24  -antibiotics as above, she has a JP drain - Per GYN  Severe hypoalbuminemia - Albumin  is 1.7, likely worsened in the setting of above infection/sepsis, fatty liver noted on abdominal ultrasound in 12/24 - Definitely contributing to third spacing  Type 2 diabetes mellitus A1c is 6.9, CBGs are stable  DVT prophylaxis: Lovenox  Code Status: Full code Family Communication: None present Disposition Plan: Home pending improvement    Objective: Vitals:    12/08/23 1939 12/08/23 2350 12/09/23 0805 12/09/23 1202  BP: 121/76 133/79 124/78 114/74  Pulse: 98 (!) 103 (!) 104 (!) 104  Resp: 20 20 18 20   Temp: 98.3 F (36.8 C) 99.2 F (37.3 C) 99.4 F (37.4 C) 99.2 F (37.3 C)  TempSrc: Oral Oral Oral Oral  SpO2: 96% 96% 95% 100%  Weight:   133.4 kg     Intake/Output Summary (Last 24 hours) at 12/09/2023 1222 Last data filed at 12/09/2023 0000 Gross per 24 hour  Intake --  Output 100 ml  Net -100 ml   Filed Weights   12/09/23 0805  Weight: 133.4 kg    Examination:  General exam: Chronically ill-appearing, AAO x 3 Respiratory system: Few basilar rales Cardiovascular system: S1 & S2 heard, RRR.  Abd: nondistended, soft and nontender.Normal bowel sounds heard.,  JP drain Central nervous system: Alert and oriented. No focal neurological deficits. Extremities: no edema Skin: No rashes Psychiatry:  Mood & affect appropriate.     Data Reviewed:   CBC: Recent Labs  Lab 12/04/23 0927 12/04/23 1454 12/05/23 2333 12/06/23 0416 12/07/23 0306 12/08/23 0358 12/09/23 0342  WBC 8.4   < > 17.0* 16.8* 19.7* 18.2* 18.6*  NEUTROABS 7.2  --   --   --   --   --   --   HGB 10.5*   < > 8.8* 8.8* 9.2* 9.6* 9.5*  HCT 35.9*   < > 29.3* 28.9* 30.0* 30.3* 30.4*  MCV 85.9   < > 83.2 83.3 83.1 81.7 80.9  PLT 467*   < > 361 371 368 405* 399   < > = values in this interval not displayed.   Basic Metabolic Panel: Recent Labs  Lab 12/05/23 0420 12/05/23 1838 12/06/23 0416 12/07/23 0306 12/08/23 0358 12/09/23 0342  NA 137  --  137 137 138 139  K 3.7  --  3.6 3.4* 3.2* 3.8  CL 105  --  102 99 96* 98  CO2 26  --  27 29 30 29   GLUCOSE 122*  --  87 106* 100* 102*  BUN 11  --  10 10 7 8   CREATININE 0.52  --  0.58 0.73 0.57 0.45  CALCIUM  8.1*  --  8.2* 8.4* 9.0 8.9  MG  --  2.0  --   --   --   --    GFR: Estimated Creatinine Clearance: 116.3 mL/min (by C-G formula based on SCr of 0.45 mg/dL). Liver Function Tests: Recent Labs  Lab  12/05/23 0420 12/06/23 0416 12/07/23 0306 12/08/23 0358 12/09/23 0342  AST 14* 13* 14* 12* 16  ALT 13 12 12 12 14   ALKPHOS 38 62 57 52 50  BILITOT 0.4 0.5 0.4 0.7 0.6  PROT 6.2* 5.8* 5.8* 6.6 6.2*  ALBUMIN  2.0* 1.7* 1.7* 2.4* 2.0*   Recent Labs  Lab 12/04/23 0927  LIPASE 31   No results for input(s): AMMONIA in the last 168 hours. Coagulation Profile: Recent Labs  Lab 12/04/23 1454  INR 1.2   Cardiac Enzymes: No results for input(s): CKTOTAL, CKMB, CKMBINDEX, TROPONINI in the last 168 hours. BNP (last 3 results) No results for input(s): PROBNP in the last 8760 hours. HbA1C: No results for input(s): HGBA1C in the last 72 hours.  CBG: Recent Labs  Lab 12/08/23 1147 12/08/23 1707 12/08/23 2112 12/09/23 0611 12/09/23 1157  GLUCAP 103* 97 99 99 125*   Lipid Profile: No results for input(s): CHOL, HDL, LDLCALC, TRIG, CHOLHDL, LDLDIRECT in the last 72 hours. Thyroid Function Tests: No results for input(s): TSH, T4TOTAL, FREET4, T3FREE, THYROIDAB in the last 72 hours.  Anemia Panel: No results for input(s): VITAMINB12, FOLATE, FERRITIN, TIBC, IRON , RETICCTPCT in the last 72 hours. Urine analysis:    Component Value Date/Time   COLORURINE YELLOW 12/05/2023 1720   APPEARANCEUR CLEAR 12/05/2023 1720   LABSPEC >1.046 (H) 12/05/2023 1720   PHURINE 5.0 12/05/2023 1720   GLUCOSEU NEGATIVE 12/05/2023 1720   HGBUR MODERATE (A) 12/05/2023 1720   BILIRUBINUR NEGATIVE 12/05/2023 1720   BILIRUBINUR negative 09/20/2023 1638   BILIRUBINUR negative 09/14/2021 0839   KETONESUR NEGATIVE 12/05/2023 1720   PROTEINUR 30 (A) 12/05/2023 1720   UROBILINOGEN 0.2 09/20/2023 1638   NITRITE NEGATIVE 12/05/2023 1720   LEUKOCYTESUR NEGATIVE 12/05/2023 1720   Sepsis Labs: @LABRCNTIP (procalcitonin:4,lacticidven:4)  ) Recent Results (from the past 240 hours)  Culture, blood (routine x 2)     Status: None   Collection Time: 12/04/23 11:47  AM   Specimen: BLOOD  Result Value Ref Range Status   Specimen Description   Final    BLOOD LEFT ANTECUBITAL Performed at [facility], [address] [place], [place] [address]    Special Requests   Final    BOTTLES DRAWN AEROBIC AND ANAEROBIC Blood Culture adequate volume Performed at [facility], [address], [place], [place] [address]    Culture   Final    NO GROWTH 5 DAYS Performed at [facility], [address], [place], [place] [address]    Report Status 12/09/2023 FINAL  Final  Aerobic/Anaerobic Culture w Gram Stain (surgical/deep wound)     Status: None   Collection Time: 12/04/23  6:14 PM   Specimen: Wound; Body Fluid  Result Value Ref Range Status   Specimen Description WOUND  Final   Special  Requests NONE  Final   Gram Stain   Final    RARE WBC PRESENT, PREDOMINANTLY PMN NO ORGANISMS SEEN    Culture   Final    FEW GROUP B STREP(S.AGALACTIAE)ISOLATED TESTING AGAINST S. AGALACTIAE NOT ROUTINELY PERFORMED DUE TO PREDICTABILITY OF AMP/PEN/VAN SUSCEPTIBILITY. NO ANAEROBES ISOLATED Performed at [facility], [address], [place], [place] [address]    Report Status 12/09/2023 FINAL  Final     Radiology Studies: DG CHEST PORT 1 VIEW Result Date: 12/08/2023 CLINICAL DATA:  200808 Hypoxia [id number] EXAM: PORTABLE CHEST - 1 VIEW COMPARISON:  December 05, 2023 FINDINGS: Low lung volumes with elevation of the right hemidiaphragm. Patchy bibasilar airspace opacities. Similar right pleural effusion. No pneumothorax. No cardiomegaly. Tortuous aorta with aortic atherosclerosis. No acute fracture or destructive lesions. Multilevel thoracic osteophytosis. IMPRESSION: Low lung volumes. Similar patchy airspace disease in both lung bases with small right pleural effusion. Electronically Signed   By: Rogelia Myers M.D.   On: 12/08/2023 11:19     Scheduled Meds:  chewing gum (ORBIT) sugar free  1 Stick Oral QID   Chlorhexidine   Gluconate Cloth  6 each Topical Daily   enoxaparin  (LOVENOX ) injection  60 mg Subcutaneous Q24H   famotidine   20 mg Oral BID   feeding supplement  237 mL Oral BID BM   insulin  aspart  0-15 Units Subcutaneous TID WC   lactulose   20 g Oral BID   metroNIDAZOLE   500 mg Oral Q12H   pantoprazole   40 mg Oral Q1200   polyethylene glycol  17 g Oral QODAY   prenatal multivitamin  1 tablet Oral Q1200   simethicone   80 mg Oral TID   spironolactone   25 mg Oral Daily   torsemide   40 mg Oral Daily   Continuous Infusions:   ceFAZolin  (ANCEF ) IV       LOS: 5 days    Time spent:    Sigurd Pac, MD [facility]   12/09/2023, 12:22 PM

## 2023-12-09 NOTE — Progress Notes (Signed)
 Occupational Therapy Treatment Patient Details Name: Amanda Paul MRN: [id number] DOB: 04-25-1975 Today's Date: 12/09/2023   History of present illness Pt is 48 year old presented to [facility] on  12/04/23 for acute abd pain. Pt transferred to [facility] with uterine rupture of necrotic fibroid with chemical peritonitis. Pt underwent emergent total abdominal hysterectomy and bil salpingo-oopherectomy. PMH - uterine fibroids, htn, obesity,   OT comments  Pt progressing well towards goals. Session focused on education for managing ADLs and IADLs as well as compensatory strategies in home environment. Demo'd use of AE to promote energy conservation in home. Educated pt on purchasing information and use of DME for functional transfers. Pt able to return demo of tub shower transfer, but encouraged supervision upon d/c.  Pt lives alone and would benefit from further post-acute rehab to increase functional independence d/t pt with limited support available upon d/c. Will continue to follow acutely.      If plan is discharge home, recommend the following:  A little help with walking and/or transfers;A little help with bathing/dressing/bathroom;Assist for transportation;Help with stairs or ramp for entrance   Equipment Recommendations  BSC/3in1;Tub/shower bench       Precautions / Restrictions Precautions Precautions: Fall;Other (comment) Recall of Precautions/Restrictions: Intact Precaution/Restrictions Comments: abd protection precs, watch O2 Required Braces or Orthoses: Other Brace Other Brace: abd binder for comfort, pt defers today since not getting to EOB/OOB Restrictions Weight Bearing Restrictions Per Provider Order: No       Mobility Bed Mobility Overal bed mobility: Needs Assistance       General bed mobility comments: Received in recliner    Transfers Overall transfer level: Needs assistance Equipment used: Rolling walker (2 wheels) Transfers: Sit to/from Stand Sit to  Stand: Contact guard assist, From elevated surface       General transfer comment: Increased time and effort to come to stand. Slow with mobility.     Balance Overall balance assessment: Needs assistance Sitting-balance support: Feet supported Sitting balance-Leahy Scale: Good     Standing balance support: No upper extremity supported Standing balance-Leahy Scale: Poor Standing balance comment: appears reliant on single UE support for static standing today       ADL either performed or assessed with clinical judgement   ADL Overall ADL's : Needs assistance/impaired     Lower Body Dressing Details (indicate cue type and reason): Reviewed use of reacher and sock aid, pt verbalizing understanding Toilet Transfer: Supervision/safety;Ambulation;Rolling walker (2 wheels) Toilet Transfer Details (indicate cue type and reason): Simulated in room     Tub/ Shower Transfer: Contact guard assist;Rolling walker (2 wheels);Ambulation Tub/Shower Transfer Details (indicate cue type and reason): Simulated in room for tub shower, CGA for balance Functional mobility during ADLs: Contact guard assist;Rolling walker (2 wheels) General ADL Comments: Session focused on education of AE and DME. Educated pt on compensatory strategies for ADLs    Extremity/Trunk Assessment Upper Extremity Assessment Upper Extremity Assessment: Overall WFL for tasks assessed   Lower Extremity Assessment Lower Extremity Assessment: Defer to PT evaluation        Vision   Vision Assessment?: No apparent visual deficits         Communication Communication Communication: No apparent difficulties   Cognition Arousal: Alert Behavior During Therapy: WFL for tasks assessed/performed Cognition: No apparent impairments         Following commands: Intact        Cueing   Cueing Techniques: Verbal cues, Gestural cues        General Comments  VSS on RA    Pertinent Vitals/ Pain       Pain Assessment Pain  Assessment: Faces Faces Pain Scale: Hurts a little bit Pain Location: abdomen at rest Pain Descriptors / Indicators: Grimacing, Guarding Pain Intervention(s): Monitored during session   Frequency  Min 2X/week        Progress Toward Goals  OT Goals(current goals can now be found in the care plan section)  Progress towards OT goals: Progressing toward goals  Acute Rehab OT Goals Patient Stated Goal: To get stronger OT Goal Formulation: With patient Time For Goal Achievement: 12/20/23 Potential to Achieve Goals: Good ADL Goals Pt Will Perform Upper Body Dressing: with modified independence Pt Will Perform Lower Body Dressing: with modified independence;with adaptive equipment;sit to/from stand Pt Will Transfer to Toilet: with modified independence Pt Will Perform Toileting - Clothing Manipulation and hygiene: with modified independence  Plan         AM-PAC OT 6 Clicks Daily Activity     Outcome Measure   Help from another person eating meals?: None Help from another person taking care of personal grooming?: A Little Help from another person toileting, which includes using toliet, bedpan, or urinal?: A Little Help from another person bathing (including washing, rinsing, drying)?: A Little Help from another person to put on and taking off regular upper body clothing?: A Little Help from another person to put on and taking off regular lower body clothing?: A Little 6 Click Score: 19    End of Session Equipment Utilized During Treatment: Rolling walker (2 wheels)  OT Visit Diagnosis: Unsteadiness on feet (R26.81);Other abnormalities of gait and mobility (R26.89);Pain   Activity Tolerance Patient tolerated treatment well   Patient Left in chair;with family/visitor present;with call bell/phone within reach   Nurse Communication Mobility status        Time: 9098-9063 OT Time Calculation (min): 35 min  Charges: OT General Charges $OT Visit: 1 Visit OT  Treatments $Self Care/Home Management : 23-37 mins  Adrianne BROCKS, OT  Acute Rehabilitation Services Office [phone] Secure chat preferred   Adrianne GORMAN [employer] 12/09/2023, 11:32 AM

## 2023-12-09 NOTE — Plan of Care (Signed)

## 2023-12-09 NOTE — Progress Notes (Signed)
 Physical Therapy Treatment Patient Details Name: Amanda Paul MRN: [id number] DOB: February 22, 1976 Today's Date: 12/09/2023   History of Present Illness Pt is 48 year old presented to [facility] on  12/04/23 for acute abd pain. Pt transferred to [facility] with uterine rupture of necrotic fibroid with chemical peritonitis. Pt underwent emergent total abdominal hysterectomy and bil salpingo-oopherectomy. PMH - uterine fibroids, htn, obesity,    PT Comments  Pt admitted with above diagnosis. Pt was able to progress ambulation today.  Pt continues to report pain and slow with transitional movements. Continues to need more assist to get OOB and is progressing to CGA with ambulation with device.  Pt lives alone and needs to be Modif I with all aspects of mobility prior to d/c home therefore recommend post acute rehab < 3 hours day and pt agrees. Will continue to follow pt acutely.  Pt currently with functional limitations due to the deficits listed below (see PT Problem List). Pt will benefit from acute skilled PT to increase their independence and safety with mobility to allow discharge.       If plan is discharge home, recommend the following: Assist for transportation;Assistance with cooking/housework;A little help with walking and/or transfers;Help with stairs or ramp for entrance;A little help with bathing/dressing/bathroom   Can travel by private vehicle     Yes  Equipment Recommendations  Rollator (4 wheels);Hospital bed;BSC/3in1    Recommendations for Other Services OT consult     Precautions / Restrictions Precautions Precautions: Fall;Other (comment) Recall of Precautions/Restrictions: Intact Precaution/Restrictions Comments: abd protection precs, watch O2, JP drain Required Braces or Orthoses: Other Brace Other Brace: abd binder for comfort Restrictions Weight Bearing Restrictions Per Provider Order: No     Mobility  Bed Mobility               General bed mobility comments:  Pt on 3N1 on arrival.    Transfers Overall transfer level: Needs assistance Equipment used: Rolling walker (2 wheels) Transfers: Sit to/from Stand Sit to Stand: Contact guard assist           General transfer comment: No assist needed to stand today to RW    Ambulation/Gait Ambulation/Gait assistance: Contact guard assist Gait Distance (Feet): 225 Feet Assistive device: Rolling walker (2 wheels) Gait Pattern/deviations: Step-through pattern, Decreased stride length, Drifts right/left Gait velocity: decr Gait velocity interpretation: <1.8 ft/sec, indicate of risk for recurrent falls   General Gait Details: Pt able to ambulate into hallway with RW.  Moves slowly but without LOB with use of RW.   Stairs             Wheelchair Mobility     Tilt Bed    Modified Rankin (Stroke Patients Only)       Balance Overall balance assessment: Needs assistance Sitting-balance support: Feet supported, No upper extremity supported Sitting balance-Leahy Scale: Good     Standing balance support: No upper extremity supported, Bilateral upper extremity supported, During functional activity Standing balance-Leahy Scale: Poor Standing balance comment: appears reliant on bil UE support for static standing today                            Communication Communication Communication: No apparent difficulties  Cognition Arousal: Alert Behavior During Therapy: WFL for tasks assessed/performed   PT - Cognitive impairments: No apparent impairments  Following commands: Intact      Cueing Cueing Techniques: Verbal cues, Gestural cues  Exercises Other Exercises Other Exercises: supine BLE AROM: ankle pumps, quad sets, LAQ,  hip ab/adduction x3-10 reps Other Exercises: reviewed gait belt to use with calf stretch x30 sec a few times daily and PRN as a leg lifter    General Comments General comments (skin integrity, edema, etc.): VSS on  RA      Pertinent Vitals/Pain Pain Assessment Pain Assessment: Faces Faces Pain Scale: Hurts little more Pain Location: abdomen with movement Pain Descriptors / Indicators: Grimacing, Guarding Pain Intervention(s): Limited activity within patient's tolerance, Monitored during session, Repositioned    Home Living                          Prior Function            PT Goals (current goals can now be found in the care plan section) Progress towards PT goals: Progressing toward goals    Frequency    Min 2X/week      PT Plan      Co-evaluation              AM-PAC PT 6 Clicks Mobility   Outcome Measure  Help needed turning from your back to your side while in a flat bed without using bedrails?: A Little Help needed moving from lying on your back to sitting on the side of a flat bed without using bedrails?: A Lot (w/o rail) Help needed moving to and from a bed to a chair (including a wheelchair)?: A Little Help needed standing up from a chair using your arms (e.g., wheelchair or bedside chair)?: A Little Help needed to walk in hospital room?: A Little Help needed climbing 3-5 steps with a railing? : Total 6 Click Score: 15    End of Session Equipment Utilized During Treatment: Gait belt Activity Tolerance: Patient limited by fatigue;Patient limited by pain Patient left: with call bell/phone within reach;in chair;with chair alarm set Nurse Communication: Mobility status PT Visit Diagnosis: Unsteadiness on feet (R26.81);Other abnormalities of gait and mobility (R26.89);Muscle weakness (generalized) (M62.81);Pain Pain - part of body:  (abdomen)     Time: 8883-8864 PT Time Calculation (min) (ACUTE ONLY): 19 min  Charges:    $Gait Training: 8-22 mins PT General Charges $$ ACUTE PT VISIT: 1 Visit                     Shawnell Dykes M,PT Acute Rehab Services [phone]    Stephane JULIANNA Bevel 12/09/2023, 1:18 PM

## 2023-12-09 NOTE — Progress Notes (Signed)
 Gynecology Progress Note  Admission Date: 12/04/2023 Current Date: 12/09/2023 8:30 AM  Amanda Paul is a 48 y.o. G0P0000 POD5 s/p TAH/BSO for necrotic fibroid and uterine rupture remote from [place] in 2022.   History complicated by: Patient Active Problem List   Diagnosis Date Noted   DM2 (diabetes mellitus, type 2) (HCC) 12/06/2023   Sepsis (HCC) 12/06/2023   Acute hypoxic respiratory failure (HCC) 12/06/2023   CHF with unknown LVEF (HCC) 12/06/2023   Pneumonia 12/06/2023   Fibroid 12/04/2023   Persistent cough 11/20/2023   Peripheral edema 11/20/2023   Chronic pain of both ankles 10/01/2023   Allergic rhinitis with postnasal drip 10/01/2023   Class 3 severe obesity due to excess calories with serious comorbidity and body mass index (BMI) of 45.0 to 49.9 in adult 03/21/2023   Pure hypercholesterolemia 03/21/2023   Coronary artery calcification 03/21/2023   Enlarged liver 03/21/2023   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  Subjective:  She feels well this evening. She has no complaints. She denies N/V. Denies any pain. She is ambulatory to chair and bathroom. + flatus.   Objective:    Current Vital Signs 24h Vital Sign Ranges  T 99.4 F (37.4 C) Temp  Avg: 99.4 F (37.4 C)  Min: 98.3 F (36.8 C)  Max: 99.8 F (37.7 C)  BP 124/78 BP  Min: 121/76  Max: 135/77  HR (!) 104 Pulse  Avg: 99.7  Min: 95  Max: 104  RR 18 Resp  Avg: 19.7  Min: 18  Max: 20  SaO2 95 % Room Air SpO2  Avg: 96.2 %  Min: 95 %  Max: 98 %       24 Hour I/O Current Shift I/O  Time Ins Outs 08/28 0701 - 08/29 0700 In: -  Out: 980 [Urine:800; Drains:180] No  intake/output data recorded.     Patient Vitals for the past 24 hrs:  BP Temp Temp src Pulse Resp SpO2 Weight  12/09/23 0805 124/78 99.4 F (37.4 C) Oral (!) 104 18 95 % 133.4 kg  12/08/23 2350 133/79 99.2 F (37.3 C) Oral (!) 103 20 96 % --  12/08/23 1939 121/76 98.3 F (36.8 C) Oral 98 20 96 % --  12/08/23 1711 135/77 99.8 F (37.7 C) Oral 100 20 97 % --  12/08/23 1153 121/82 99.8 F (37.7 C) Oral 95 20 98 % --  12/08/23 0859 135/79 99.6 F (37.6 C) Oral 98 20 95 % --    Physical exam: General appearance: alert, cooperative, and appears stated age Abdomen: soft, nttp, non-distended, c/d/I incision with honeycomb in place but serosanguinous fluid at the base. JP with 5-10 cc of serous fluid. BS quiet but present.  GU: No gross VB Lungs: Normal respiratory effort, lower lung bases with faint crackles Heart: S1, S2 normal, no murmur, rub or gallop, regular rate and rhythm Extremities: LE edema stable, non-pitting, SCDs on Skin: warm/dry Psych: appropriate Neurologic: Grossly normal  Labs  Recent Labs  Lab 12/07/23 0306 12/08/23 0358 12/09/23 0342  WBC 19.7* 18.2* 18.6*  HGB 9.2* 9.6* 9.5*  HCT 30.0* 30.3* 30.4*  PLT 368 405* 399    Recent Labs  Lab 12/07/23 0306  12/08/23 0358 12/09/23 0342  NA 137 138 139  K 3.4* 3.2* 3.8  CL 99 96* 98  CO2 29 30 29   BUN 10 7 8   CREATININE 0.73 0.57 0.45  CALCIUM  8.4* 9.0 8.9  PROT 5.8* 6.6 6.2*  BILITOT 0.4 0.7 0.6  ALKPHOS 57 52 50  ALT 12 12 14   AST 14* 12* 16  GLUCOSE 106* 100* 102*    Assessment & Plan:  Patient status improving.  *GYN:  - Voiding spontaneously.  - JP drain continuing with output of sanguinous fluid - Path still pending  *Cardiac:  - on tele - Lasix  and aldactone .  - Echo with diastolic dysfunction but normal EF. Currently heplocked.    *ID: - s/p Vanc. Still on Cefepime .  - WBC steady at 18.6.  - Will curbside ID.    *Heme: - Hgb stable at this time. - Will eventually start PO  iron  every other day as an outpt.   *Pulm:  - Encouraged I/S - O2 is 2L currently and O2 sats good on this.   - Vanc d/c'd today. Continue Cefepime , but will discuss with ID about discontinuing.  - Cultures have had no growth.   *Pain:  - Well controlled - using oxycodone  sparingly  *FEN/GI:  - Diet as tolerated.  - Encouraged continued high protein diet. Continue Ensure.   - Chewing gum. PPI.  - Hypokalemia - resolved s/p repletion.   *PPx:  - pLovenox. Continue SCDs.  - Encourage ambulation - she is working with PT/OT. OT expressed concern that she made need SNF. Case management consulted.   *Dispo: pending other medical goals. She is now meeting all surgical goals.   Code Status: Full Code  Vina Solian, MD Attending Center for [facility])

## 2023-12-09 NOTE — TOC Progression Note (Addendum)
 Transition of Care [facility]) - Progression Note    Patient Details  Name: Niko Jakel MRN: [id number] Date of Birth: Oct 07, 1975  Transition of [facility]) CM/SW Contact  Montie LOISE Louder, [place] Phone Number: 12/09/2023, 3:44 PM  Clinical Narrative:     CSW unable to complete assessment- patient care in progress.    TOC will continue to follow and assist with discharge planning.  Montie Louder, MSW, LCSW Clinical Social Worker                      Expected Discharge Plan and Services                                               Social Drivers of Health (SDOH) Interventions SDOH Screenings   Food Insecurity: No Food Insecurity (12/05/2023)  Housing: Low Risk  (12/05/2023)  Transportation Needs: No Transportation Needs (12/05/2023)  Utilities: Not At Risk (12/06/2023)  Alcohol Screen: Low Risk  (11/16/2023)  Depression (PHQ2-9): Low Risk  (11/16/2023)  Financial Resource Strain: Low Risk  (11/16/2023)  Physical Activity: Insufficiently Active (11/16/2023)  Social Connections: Moderately Isolated (11/16/2023)  Stress: No Stress Concern Present (11/16/2023)  Tobacco Use: Low Risk  (12/04/2023)    Readmission Risk Interventions     No data to display

## 2023-12-09 NOTE — Consult Note (Addendum)
 Date of Admission:  12/04/2023          Reason for Consult: Leukocytosis    Referring Provider: Vina Solian, MD   Assessment:  Uterine rupture with necrosis and peritonitis requiring emergency surgery with total abdominal hysterectomy and bilateral salpingo-oophorectomy --with intraoperative cultures of necrotic fluid yielding group B streptococcus Postoperative episode of hypoxia with CT angiogram ruling out PE and showing a pleural effusion and atelectasis versus infiltrate Leukocytosis Diabetes mellitus Penicillin allergy with hives 20 years ago  Plan:  Will switch her back to IV antibiotics in the form of cefazolin  for now Will get CT abdomen pelvis to rule out residual occult infection in the abdomen If this is reassuring we can chart out a course of oral antibiotics I think her leukocytosis will resolve if given more time   Principal Problem:   Acute hypoxic respiratory failure (HCC) Active Problems:   Fibroid   DM2 (diabetes mellitus, type 2) (HCC)   Sepsis (HCC)   CHF with unknown LVEF (HCC)   Pneumonia   Scheduled Meds:  chewing gum (ORBIT) sugar free  1 Stick Oral QID   Chlorhexidine  Gluconate Cloth  6 each Topical Daily   enoxaparin  (LOVENOX ) injection  60 mg Subcutaneous Q24H   famotidine   20 mg Oral BID   feeding supplement  237 mL Oral BID BM   insulin  aspart  0-15 Units Subcutaneous TID WC   lactulose   20 g Oral BID   metroNIDAZOLE   500 mg Oral Q12H   pantoprazole   40 mg Oral Q1200   polyethylene glycol  17 g Oral QODAY   prenatal multivitamin  1 tablet Oral Q1200   simethicone   80 mg Oral TID   spironolactone   25 mg Oral Daily   torsemide   40 mg Oral Daily   Continuous Infusions:   ceFAZolin  (ANCEF ) IV     PRN Meds:.acetaminophen , diphenhydrAMINE , HYDROmorphone  (DILAUDID ) injection, LORazepam , ondansetron  **OR** ondansetron  (ZOFRAN ) IV, oxyCODONE   HPI: Amanda Paul is a 48 y.o. female with a known history of fibroids who  presented to the ER acutely on 21 August.  CT of the abdomen pelvis showed moderate volume of ascites with marked uterine enlargement and multiple calcified fibroids and potential circumferential wall thickening the proximal jejunal loops.  Blood cultures were taken on admission which fortunately of not grown any organism.  The patient was taken emergently to the operating room by Dr. Solian and underwent total abdominal hysterectomy and bilateral salpingo oophorectomy.  The patient was found in the operating room to have rupture of uterus with necrotic fluid.  Cultures were taken which have separately grown Group B streptococcus.   25th the CT angiogram was performed due to hypoxia concern for pulmonary embolism.  PE was not seen there was a right sided pleural effusion with some atelectasis.  This was thought to potentially also be pneumonia though I am quite skeptical of the latter.  She is improved clinically and is now afebrile but has had a climbing white blood cell count.  Was initially on vancomycin  and cefepime  and then was narrowed ultimately to cefepime  yesterday.  She was actually changed over to oral antibiotics this morning.  I think she is clinically improving and her white blood cell count is simply not catching up to how well she is doing clinically.  That being said I think it is reasonable to repeat CT of the abdomen pelvis to ensure there is no occult infection that is present.  Will  change her back over to cefazolin  and follow-up results of the CT scan which I hope to be reassuring.  If so we can plan a defined course of postoperative oral antibiotics.  In that situation would likely go to the Ceftin  that she was switched to just as we were seeing her.  I have personally spent 80 minutes involved in face-to-face and non-face-to-face activities for this patient on the day of the visit. Professional time spent includes the following activities: Preparing to see the patient  (review of tests), Obtaining and/or reviewing separately obtained history (admission/discharge record), Performing a medically appropriate examination and/or evaluation , Ordering medications/tests/procedures, referring and communicating with other health care professionals, Documenting clinical information in the EMR, Independently interpreting results (not separately reported), Communicating results to the patient/family/caregiver, Counseling and educating the patient/family/caregiver and Care coordination (not separately reported).   Evaluation of the patient requires complex antimicrobial therapy evaluation, counseling , isolation needs to reduce disease transmission and risk assessment and mitigation.     Review of Systems: Review of Systems  Constitutional:  Positive for fever. Negative for chills, malaise/fatigue and weight loss.  HENT:  Negative for congestion and sore throat.   Eyes:  Negative for blurred vision and photophobia.  Respiratory:  Negative for cough, shortness of breath and wheezing.   Cardiovascular:  Negative for chest pain, palpitations and leg swelling.  Gastrointestinal:  Positive for abdominal pain. Negative for blood in stool, constipation, diarrhea, heartburn, melena, nausea and vomiting.  Genitourinary:  Negative for dysuria, flank pain and hematuria.  Musculoskeletal:  Negative for back pain, falls, joint pain and myalgias.  Skin:  Negative for itching and rash.  Neurological:  Negative for dizziness, focal weakness, loss of consciousness, weakness and headaches.  Endo/Heme/Allergies:  Does not bruise/bleed easily.  Psychiatric/Behavioral:  Negative for depression and suicidal ideas. The patient does not have insomnia.     Past Medical History:  Diagnosis Date   Allergy    Arthritis    Hernia, umbilical    Hyperlipidemia    Hypertension    Low grade squamous intraepithelial lesion (LGSIL) on cervicovaginal cytologic smear 06/10/2000   Morbid obesity (HCC)     Pain in right hand 01/19/2019   Prediabetes 03/21/2023   Umbilical hernia 04/14/2012   Uterine fibroids     Social History   Tobacco Use   Smoking status: Never   Smokeless tobacco: Never  Vaping Use   Vaping status: Never Used  Substance Use Topics   Alcohol use: Yes    Alcohol/week: 1.0 standard drink of alcohol    Types: 1 Cans of beer per week    Comment: rare   Drug use: No    Family History  Problem Relation Age of Onset   Hypertension Mother    Colon polyps Neg Hx    Colon cancer Neg Hx    Esophageal cancer Neg Hx    Rectal cancer Neg Hx    Stomach cancer Neg Hx    Allergies  Allergen Reactions   Amoxicillin Hives, Itching and Swelling    Swollen eyes and throat.   Doxycycline  Hyclate Swelling    Bilateral lower leg swelling   Penicillins Hives and Swelling    OBJECTIVE: Blood pressure 114/74, pulse (!) 104, temperature 99.2 F (37.3 C), temperature source Oral, resp. rate 20, weight 133.4 kg, SpO2 100%.  Physical Exam Constitutional:      General: She is not in acute distress.    Appearance: Normal appearance. She is well-developed. She is not  ill-appearing or diaphoretic.  HENT:     Head: Normocephalic and atraumatic.     Right Ear: Hearing and external ear normal.     Left Ear: Hearing and external ear normal.     Nose: No nasal deformity or rhinorrhea.  Eyes:     General: No scleral icterus.    Conjunctiva/sclera: Conjunctivae normal.     Right eye: Right conjunctiva is not injected.     Left eye: Left conjunctiva is not injected.     Pupils: Pupils are equal, round, and reactive to light.  Neck:     Vascular: No JVD.  Cardiovascular:     Rate and Rhythm: Normal rate and regular rhythm.     Heart sounds: Normal heart sounds, S1 normal and S2 normal. No murmur heard.    No friction rub.  Pulmonary:     Effort: No respiratory distress.     Breath sounds: No stridor. No wheezing or rhonchi.  Abdominal:     General: Bowel sounds are  normal. There is no distension.     Palpations: Abdomen is soft.     Tenderness: There is no abdominal tenderness.  Musculoskeletal:        General: Normal range of motion.     Right shoulder: Normal.     Left shoulder: Normal.     Cervical back: Normal range of motion and neck supple.     Right hip: Normal.     Left hip: Normal.     Right knee: Normal.     Left knee: Normal.  Lymphadenopathy:     Head:     Right side of head: No submandibular, preauricular or posterior auricular adenopathy.     Left side of head: No submandibular, preauricular or posterior auricular adenopathy.     Cervical: No cervical adenopathy.     Right cervical: No superficial or deep cervical adenopathy.    Left cervical: No superficial or deep cervical adenopathy.  Skin:    General: Skin is warm and dry.     Coloration: Skin is not pale.     Findings: No abrasion, bruising, ecchymosis, erythema, lesion or rash.     Nails: There is no clubbing.  Neurological:     Mental Status: She is alert and oriented to person, place, and time.     Sensory: No sensory deficit.     Coordination: Coordination normal.     Gait: Gait normal.  Psychiatric:        Attention and Perception: She is attentive.        Mood and Affect: Mood normal.        Speech: Speech normal.        Behavior: Behavior normal. Behavior is cooperative.        Thought Content: Thought content normal.        Judgment: Judgment normal.    Surgical site is bandaged  Lab Results Lab Results  Component Value Date   WBC 18.6 (H) 12/09/2023   HGB 9.5 (L) 12/09/2023   HCT 30.4 (L) 12/09/2023   MCV 80.9 12/09/2023   PLT 399 12/09/2023    Lab Results  Component Value Date   CREATININE 0.45 12/09/2023   BUN 8 12/09/2023   NA 139 12/09/2023   K 3.8 12/09/2023   CL 98 12/09/2023   CO2 29 12/09/2023    Lab Results  Component Value Date   ALT 14 12/09/2023   AST 16 12/09/2023   ALKPHOS 50 12/09/2023   BILITOT 0.6  12/09/2023      Microbiology: Recent Results (from the past 240 hours)  Culture, blood (routine x 2)     Status: None   Collection Time: 12/04/23 11:47 AM   Specimen: BLOOD  Result Value Ref Range Status   Specimen Description   Final    BLOOD LEFT ANTECUBITAL Performed at [facility], [address], [place], [place] [address]    Special Requests   Final    BOTTLES DRAWN AEROBIC AND ANAEROBIC Blood Culture adequate volume Performed at [facility], [address] [place], [place] [address]    Culture   Final    NO GROWTH 5 DAYS Performed at [facility], [address], [place], [place] [address]    Report Status 12/09/2023 FINAL  Final  Aerobic/Anaerobic Culture w Gram Stain (surgical/deep wound)     Status: None   Collection Time: 12/04/23  6:14 PM   Specimen: Wound; Body Fluid  Result Value Ref Range Status   Specimen Description WOUND  Final   Special Requests NONE  Final   Gram Stain   Final    RARE WBC PRESENT, PREDOMINANTLY PMN NO ORGANISMS SEEN    Culture   Final    FEW GROUP B STREP(S.AGALACTIAE)ISOLATED TESTING AGAINST S. AGALACTIAE NOT ROUTINELY PERFORMED DUE TO PREDICTABILITY OF AMP/PEN/VAN SUSCEPTIBILITY. NO ANAEROBES ISOLATED Performed at [facility], [address], [place], [place] [address]    Report Status 12/09/2023 FINAL  Final    Jomarie Fleeta Rothman, MD [facility] for Infectious [facility] [phone] pager  12/09/2023, 12:14 PM

## 2023-12-10 DIAGNOSIS — J9601 Acute respiratory failure with hypoxia: Secondary | ICD-10-CM | POA: Diagnosis not present

## 2023-12-10 DIAGNOSIS — K651 Peritoneal abscess: Secondary | ICD-10-CM

## 2023-12-10 DIAGNOSIS — R188 Other ascites: Secondary | ICD-10-CM | POA: Insufficient documentation

## 2023-12-10 DIAGNOSIS — D259 Leiomyoma of uterus, unspecified: Secondary | ICD-10-CM | POA: Insufficient documentation

## 2023-12-10 LAB — COMPREHENSIVE METABOLIC PANEL WITH GFR
ALT: 17 U/L (ref 0–44)
AST: 19 U/L (ref 15–41)
Albumin: 2 g/dL — ABNORMAL LOW (ref 3.5–5.0)
Alkaline Phosphatase: 52 U/L (ref 38–126)
Anion gap: 12 (ref 5–15)
BUN: 8 mg/dL (ref 6–20)
CO2: 31 mmol/L (ref 22–32)
Calcium: 8.7 mg/dL — ABNORMAL LOW (ref 8.9–10.3)
Chloride: 94 mmol/L — ABNORMAL LOW (ref 98–111)
Creatinine, Ser: 0.61 mg/dL (ref 0.44–1.00)
GFR, Estimated: >60 mL/min (ref >60)
Glucose, Bld: 110 mg/dL — ABNORMAL HIGH (ref 70–99)
Potassium: 3.5 mmol/L (ref 3.5–5.1)
Sodium: 137 mmol/L (ref 135–145)
Total Bilirubin: 0.5 mg/dL (ref 0.0–1.2)
Total Protein: 6.4 g/dL — ABNORMAL LOW (ref 6.5–8.1)

## 2023-12-10 LAB — CBC
HCT: 29.6 % — ABNORMAL LOW (ref 36.0–46.0)
Hemoglobin: 9.2 g/dL — ABNORMAL LOW (ref 12.0–15.0)
MCH: 25.1 pg — ABNORMAL LOW (ref 26.0–34.0)
MCHC: 31.1 g/dL (ref 30.0–36.0)
MCV: 80.9 fL (ref 80.0–100.0)
Platelets: 415 K/uL — ABNORMAL HIGH (ref 150–400)
RBC: 3.66 MIL/uL — ABNORMAL LOW (ref 3.87–5.11)
RDW: 16.8 % — ABNORMAL HIGH (ref 11.5–15.5)
WBC: 20.3 K/uL — ABNORMAL HIGH (ref 4.0–10.5)
nRBC: 0.2 % (ref 0.0–0.2)

## 2023-12-10 LAB — GLUCOSE, CAPILLARY
Glucose-Capillary: 109 mg/dL — ABNORMAL HIGH (ref 70–99)
Glucose-Capillary: 114 mg/dL — ABNORMAL HIGH (ref 70–99)
Glucose-Capillary: 111 mg/dL — ABNORMAL HIGH (ref 70–99)
Glucose-Capillary: 115 mg/dL — ABNORMAL HIGH (ref 70–99)

## 2023-12-10 LAB — PROTIME-INR
INR: 1.2 (ref 0.8–1.2)
Prothrombin Time: 15.8 s — ABNORMAL HIGH (ref 11.4–15.2)

## 2023-12-10 MED ORDER — POTASSIUM CHLORIDE CRYS ER 20 MEQ PO TBCR
40.0000 meq | EXTENDED_RELEASE_TABLET | Freq: Two times a day (BID) | ORAL | Status: AC
Start: 2023-12-10 — End: 2023-12-10
  Administered 2023-12-10 (×2): 40 meq via ORAL
  Filled 2023-12-10 (×2): qty 2

## 2023-12-10 NOTE — TOC Progression Note (Signed)
 Transition of Care [facility]) - Initial/Assessment Note    Patient Details  Name: Amanda Paul MRN: [id number] Date of Birth: 1976/04/06  [facility]) CM/SW Contact:    Britt JULIANNA Bennetts, LCSW Phone Number: 12/10/2023, 3:51 PM  Clinical Narrative:                 CSW informed by [facility] that patient requested SNF.  CSW contacted patient and informed of PT's recommendation for home health and educated on the insurance process.  Patient agreeable to home with home health at this time and requested more information from [facility].  RNCM notified.  TOC will continue to follow.          Patient Goals and CMS Choice            Expected Discharge Plan and Services                                              Prior Living Arrangements/Services                       Activities of Daily Living   ADL Screening (condition at time of admission) Is the patient deaf or have difficulty hearing?: No Does the patient have difficulty seeing, even when wearing glasses/contacts?: No Does the patient have difficulty concentrating, remembering, or making decisions?: No  Permission Sought/Granted                  Emotional Assessment              Admission diagnosis:  Fibroid [D21.9] Other ascites [R18.8] Uterine leiomyoma, unspecified location [D25.9] Patient Active Problem List   Diagnosis Date Noted   Other ascites 12/10/2023   Abscess of abdominal cavity (HCC) 12/10/2023   Uterine leiomyoma 12/10/2023   DM2 (diabetes mellitus, type 2) (HCC) 12/06/2023   Sepsis (HCC) 12/06/2023   Acute hypoxic respiratory failure (HCC) 12/06/2023   CHF with unknown LVEF (HCC) 12/06/2023   Pneumonia 12/06/2023   Fibroid 12/04/2023   Persistent cough 11/20/2023   Peripheral edema 11/20/2023   Chronic pain of both ankles 10/01/2023   Allergic rhinitis with postnasal drip 10/01/2023   Class 3 severe obesity due to excess calories with serious comorbidity  and body mass index (BMI) of 45.0 to 49.9 in adult 03/21/2023   Pure hypercholesterolemia 03/21/2023   Coronary artery calcification 03/21/2023   Enlarged liver 03/21/2023   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   History of abnormal cervical Pap smear 07/31/2019   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   Hypertension 05/10/2012   Morbid obesity (HCC)    PCP:  Jarold Medici, MD Pharmacy:   [id number] - [place],  - [address] [place] [place] [address] Phone: [phone] Fax: [phone]     Social Drivers of Health (SDOH) Social History: SDOH Screenings   Food Insecurity: No Food Insecurity (12/05/2023)  Housing: Low Risk  (12/05/2023)  Transportation Needs: No Transportation Needs (12/05/2023)  Utilities: Not At Risk (12/06/2023)  Alcohol Screen: Low Risk  (11/16/2023)  Depression (PHQ2-9): Low Risk  (11/16/2023)  Financial Resource Strain: Low Risk  (11/16/2023)  Physical Activity: Insufficiently Active (11/16/2023)  Social Connections: Moderately Isolated (11/16/2023)  Stress: No Stress Concern Present (11/16/2023)  Tobacco Use: Low Risk  (12/04/2023)   SDOH Interventions:     Readmission Risk Interventions     No  data to display

## 2023-12-10 NOTE — Progress Notes (Signed)
 Gynecology Progress Note  Admission Date: 12/04/2023 Current Date: 12/10/2023 11:34 AM  Amanda Paul is a 48 y.o. G0P0000 POD5 s/p TAH/BSO for necrotic fibroid and uterine rupture remote from [place] in 2022.   History complicated by: Patient Active Problem List   Diagnosis Date Noted   DM2 (diabetes mellitus, type 2) (HCC) 12/06/2023   Sepsis (HCC) 12/06/2023   Acute hypoxic respiratory failure (HCC) 12/06/2023   CHF with unknown LVEF (HCC) 12/06/2023   Pneumonia 12/06/2023   Fibroid 12/04/2023   Persistent cough 11/20/2023   Peripheral edema 11/20/2023   Chronic pain of both ankles 10/01/2023   Allergic rhinitis with postnasal drip 10/01/2023   Class 3 severe obesity due to excess calories with serious comorbidity and body mass index (BMI) of 45.0 to 49.9 in adult 03/21/2023   Pure hypercholesterolemia 03/21/2023   Coronary artery calcification 03/21/2023   Enlarged liver 03/21/2023   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  Subjective:  She feels well this evening. She has no complaints. She denies N/V. Denies any pain. She is ambulatory to chair and bathroom. + flatus.   Objective:    Current Vital Signs 24h Vital Sign Ranges  T 99.1 F (37.3 C) Temp  Avg: 99.4 F (37.4 C)  Min: 98.3 F (36.8 C)  Max: 100.1 F (37.8 C)  BP 104/68 BP  Min: 104/68  Max: 144/68  HR (!) 103 Pulse  Avg: 102.8  Min: 100  Max: 104  RR (!) 24 Resp  Avg: 20.8  Min: 17  Max: 24  SaO2 (!) 88 % Room Air SpO2  Avg: 91.8 %  Min: 88 %  Max: 100 %       24 Hour I/O Current Shift I/O  Time Ins Outs 08/29 0701 - 08/30 0700 In: -  Out: 2730 [Urine:2700; Drains:30]  No intake/output data recorded.     Patient Vitals for the past 24 hrs:  BP Temp Temp src Pulse Resp SpO2  12/10/23 0735 104/68 99.1 F (37.3 C) Oral (!) 103 (!) 24 --  12/10/23 0426 104/75 98.3 F (36.8 C) Oral -- (!) 21 (!) 88 %  12/10/23 0027 127/69 100.1 F (37.8 C) Oral (!) 104 (!) 22 90 %  12/10/23 0023 127/69 100.1 F (37.8 C) Oral -- -- 90 %  12/09/23 2008 (!) 144/68 99.2 F (37.3 C) Oral 100 -- 92 %  12/09/23 1554 122/83 99.9 F (37.7 C) Oral (!) 103 17 91 %  12/09/23 1202 114/74 99.2 F (37.3 C) Oral (!) 104 20 100 %    Physical exam: General appearance: alert, cooperative, and appears stated age Abdomen: soft, nttp, non-distended, c/d/I incision with honeycomb in place but serosanguinous fluid at the base. JP with 5-10 cc of serous fluid. BS quiet but present.  GU: No gross VB Lungs: Normal respiratory effort, lower lung bases with faint crackles Heart: S1, S2 normal, no murmur, rub or gallop, regular rate and rhythm Extremities: LE edema stable, non-pitting, SCDs on Skin: warm/dry Psych: appropriate Neurologic: Grossly normal  Labs  Recent Labs  Lab 12/08/23 0358 12/09/23 0342 12/10/23 0346  WBC 18.2* 18.6* 20.3*  HGB 9.6* 9.5* 9.2*  HCT 30.3* 30.4* 29.6*  PLT 405* 399 415*    Recent Labs  Lab 12/08/23 0358 12/09/23 0342 12/10/23 0346  NA 138 139 137  K 3.2* 3.8 3.5  CL 96* 98 94*  CO2 30 29 31   BUN 7 8 8   CREATININE 0.57 0.45 0.61  CALCIUM  9.0 8.9 8.7*  PROT 6.6 6.2* 6.4*  BILITOT 0.7 0.6 0.5  ALKPHOS 52 50 52  ALT 12 14 17   AST 12* 16 19  GLUCOSE 100* 102* 110*   CT ABDOMEN PELVIS W CONTRAST Result Date: 12/09/2023 CLINICAL DATA:  Intra-abdominal infection/peritonitis (Ped 0-17y) EXAM: CT ABDOMEN AND PELVIS WITH CONTRAST TECHNIQUE: Multidetector CT imaging of the abdomen and pelvis was performed using the standard protocol following bolus administration of intravenous contrast. RADIATION DOSE REDUCTION: This exam was performed according  to the departmental dose-optimization program which includes automated exposure control, adjustment of the mA and/or kV according to patient size and/or use of iterative reconstruction technique. CONTRAST:  75mL OMNIPAQUE  IOHEXOL  350 MG/ML SOLN COMPARISON:  December 04, 2023, November 24, 2023 FINDINGS: Lower chest: Elevation of the right hemidiaphragm. Small right pleural effusion with dense consolidation in the right middle and right lower lobes containing air bronchograms. Subsegmental atelectasis in the lingula and left lower lobe. Hepatobiliary: No mass.No radiopaque stones or wall thickening of the gallbladder. No intrahepatic or extrahepatic biliary ductal dilation. The portal veins are patent. Pancreas: No mass or main ductal dilation. No peripancreatic inflammation or fluid collection. Spleen: Normal size. No mass. Adrenals/Urinary Tract: No adrenal masses. No renal mass. No nephrolithiasis or hydronephrosis. No bladder wall thickening. Small amount of nondependent gas in the bladder lumen, likely due to recent catheterization. Stomach/Bowel: The stomach is decompressed without focal abnormality. No small bowel obstruction. Fluid-filled dilation of the small bowel and proximal colon, possibly due to an adynamic ileus.Mild wall thickening of multiple segments in the left abdomen.Normal appendix. Descending and sigmoid colonic diverticulosis. No changes of acute diverticulitis. Vascular/Lymphatic: No aortic aneurysm. No intraabdominal or pelvic lymphadenopathy. Reproductive: Interval hysterectomy.Postsurgical drain in the pelvis. No free pelvic fluid. Other: No pneumoperitoneum. There is a 12.2 x 13.6 x 4.5 cm (APxTRxCC) subdiaphragmatic fluid collection exerting significant mass effect on the right hepatic dome. A couple of small soft tissue nodules are noted along the superior aspect of this fluid collection (coronal 64-66), measuring up to 8 mm. Curvilinear fluid collection along the anterior right omentum,  measuring 8.1 cm in craniocaudal dimension (axial 49-57, coronal 86). Musculoskeletal: No acute fracture or destructive lesion. Midline ventral pelvic incision without associated fluid collection. There is a fat containing supraumbilical ventral hernia containing intra-abdominal fat, which appears inflamed, likely postsurgical change. Multilevel degenerative disc disease of the spine. IMPRESSION: 1. Right subdiaphragmatic fluid collection exerting significant mass effect on the right hepatic dome, measuring 12.2 x 13.6 x 4.5 cm (APxTRxCC), worrisome for abscess. A couple of small soft tissue nodules are noted along the superior aspect of this fluid collection (coronal 64-66), measuring up to 8 mm, possibly reflecting infectious debris or blood products. Correlation with fluid sampling recommended to exclude malignant cells. 2. Curvilinear fluid collection along the anterior right lateral omentum, measuring 8.1 cm in craniocaudal dimension (axial 49-57, coronal 86), 1.2 cm thick, probably a developing abscess. 3. Mild wall thickening of multiple segments of small bowel in the left abdomen, likely reflecting reactive changes. Fluid-filled dilation of the small bowel and proximal colon, possibly due to an adynamic ileus. 4. Postsurgical drain in the rectouterine pouch. No associated fluid collection or abscess. 5. Small right  pleural effusion with dense consolidation in the right middle and right lower lobes, most likely compressive atelectasis. Electronically Signed   By: Rogelia Myers M.D.   On: 12/09/2023 18:30     Assessment & Plan:  Patient status improving.  CT showed 12 cm fluid collection between liver and right hemidiaphragm-->IR consulted to evaluate for drainage possibly tomorrow or whenever they deem would be appropriate if possible That could be the answer to the lingering fever and ^WBC counts   *GYN:  - Voiding spontaneously.  - JP drain continuing with output of sanguinous fluid - Path  benign  *Cardiac:  - on tele - Lasix  and aldactone .  - Echo with diastolic dysfunction but normal EF. Currently heplocked.    *ID: - s/p Vanc. Still on Cefepime .  - WBC steady at 18.6.  - Will curbside ID.    *Heme: - Hgb stable at this time. - Will eventually start PO iron  every other day as an outpt.   *Pulm:  - Encouraged I/S - O2 is 2L currently and O2 sats good on this.   - Vanc d/c'd today. Continue Cefepime , but will discuss with ID about discontinuing.  - Cultures have had no growth.   *Pain:  - Well controlled - using oxycodone  sparingly  *FEN/GI:  - Diet as tolerated.  - Encouraged continued high protein diet. Continue Ensure.   - Chewing gum. PPI.  - Hypokalemia - resolved s/p repletion.   *PPx:  - pLovenox. Continue SCDs.  - Encourage ambulation - she is working with PT/OT. OT expressed concern that she made need SNF. Case management consulted.   *Dispo: pending other medical goals. She is now meeting all surgical goals.   Code Status: Full Code  Vina Solian, MD Attending Center for [facility] (Faculty Practice)   Patient ID: [name], female   DOB: April 23, 1975, 48 y.o.   MRN: [id number]

## 2023-12-10 NOTE — TOC Progression Note (Addendum)
 Transition of Care [facility]) - Progression Note    Patient Details  Name: Amanda Paul MRN: [id number] Date of Birth: Jan 18, 1976  [facility]) CM/SW Contact  Robynn Eileen Hoose, RN Phone Number: 12/10/2023, 3:40 PM  Clinical Narrative:   Secure message from GYN provider regarding patient discharge needs. Recommendations for HHPT noted from physical therapy and SNF from Occupational therapy. Spoke with patient, preference is to go to SNF rehab. CSW made aware.  1601: HHPT arranged through Amy with [facility]. Contact information placed on AVS. Will need to order DME closer to discharge.  Patient will need drainage of abscess and Culture results before discharge.                      Expected Discharge Plan and Services                                               Social Drivers of Health (SDOH) Interventions SDOH Screenings   Food Insecurity: No Food Insecurity (12/05/2023)  Housing: Low Risk  (12/05/2023)  Transportation Needs: No Transportation Needs (12/05/2023)  Utilities: Not At Risk (12/06/2023)  Alcohol Screen: Low Risk  (11/16/2023)  Depression (PHQ2-9): Low Risk  (11/16/2023)  Financial Resource Strain: Low Risk  (11/16/2023)  Physical Activity: Insufficiently Active (11/16/2023)  Social Connections: Moderately Isolated (11/16/2023)  Stress: No Stress Concern Present (11/16/2023)  Tobacco Use: Low Risk  (12/04/2023)    Readmission Risk Interventions     No data to display

## 2023-12-10 NOTE — Plan of Care (Signed)

## 2023-12-10 NOTE — Progress Notes (Addendum)
 PROGRESS NOTE    Amanda Paul  FMW:[id number] DOB: [birth year] DOA: 12/04/2023 PCP: Jarold Medici, MD  48/F with obesity, hypertension, uterine fibroids was admitted to [facility] service 8/24 for hysterectomy for necrotic fibroid> chemical peritonitis, postop developed hypoxia, workup noted below for pleural effusions. - TRH consulting, started on diuretics - Also noted to be febrile, concern for sepsis postop, currently on broad-spectrum antibiotics - Persistent leukocytosis, CT 8/29 with multiple intra-abdominal abscesses feels  Subjective: - Feels better overall, no significant abdominal symptoms, breathing much better  Assessment and Plan:  Acute hypoxic respiratory failure Acute diastolic CHF - Primarily secondary to volume overload, fever, sepsis also contributing -2D echo with preserved EF, grade 2 diastolic dysfunction, mildly reduced RV - Improved with diuresis, 12 L negative, she is euvolemic now, continue torsemide  and Aldactone  - increase activity, out of bed to chair  Sepsis, intra-abdominal abscesses Necrotic uterine fibroid, uterine rupture, chemical peritonitis -ID following, now on IV Ancef , flagyl  -CT yesterday with large intra-abdominal abscesses -Will need IR consult for drainage -per primary team  Severe hypoalbuminemia - Albumin  is 1.7, likely worsened in the setting of above infection/sepsis, fatty liver noted on abdominal ultrasound in 12/24 - Definitely contributing to third spacing  Type 2 diabetes mellitus A1c is 6.9, CBGs are stable  DVT prophylaxis: Lovenox  Code Status: Full code Family Communication: None present Disposition Plan: Home pending improvement    Objective: Vitals:   12/10/23 0023 12/10/23 0027 12/10/23 0426 12/10/23 0735  BP: 127/69 127/69 104/75 104/68  Pulse:  (!) 104  (!) 103  Resp:  (!) 22 (!) 21 (!) 24  Temp: 100.1 F (37.8 C) 100.1 F (37.8 C) 98.3 F (36.8 C) 99.1 F (37.3 C)  TempSrc: Oral Oral Oral Oral   SpO2: 90% 90% (!) 88%   Weight:        Intake/Output Summary (Last 24 hours) at 12/10/2023 1122 Last data filed at 12/09/2023 1900 Gross per 24 hour  Intake --  Output 2730 ml  Net -2730 ml   Filed Weights   12/09/23 0805  Weight: 133.4 kg    Examination:  General exam: Chronically ill-appearing, AAO x 3 Respiratory system: Rare basilar rales otherwise clear Cardiovascular system: S1 & S2 heard, RRR.  Abd: nondistended, soft and nontender.Normal bowel sounds heard.,  JP drain Central nervous system: Alert and oriented. No focal neurological deficits. Extremities: no edema Skin: No rashes Psychiatry:  Mood & affect appropriate.     Data Reviewed:   CBC: Recent Labs  Lab 12/04/23 0927 12/04/23 1454 12/06/23 0416 12/07/23 0306 12/08/23 0358 12/09/23 0342 12/10/23 0346  WBC 8.4   < > 16.8* 19.7* 18.2* 18.6* 20.3*  NEUTROABS 7.2  --   --   --   --   --   --   HGB 10.5*   < > 8.8* 9.2* 9.6* 9.5* 9.2*  HCT 35.9*   < > 28.9* 30.0* 30.3* 30.4* 29.6*  MCV 85.9   < > 83.3 83.1 81.7 80.9 80.9  PLT 467*   < > 371 368 405* 399 415*   < > = values in this interval not displayed.   Basic Metabolic Panel: Recent Labs  Lab 12/05/23 1838 12/06/23 0416 12/07/23 0306 12/08/23 0358 12/09/23 0342 12/10/23 0346  NA  --  137 137 138 139 137  K  --  3.6 3.4* 3.2* 3.8 3.5  CL  --  102 99 96* 98 94*  CO2  --  27 29 30 29 31   GLUCOSE  --  87 106* 100* 102* 110*  BUN  --  10 10 7 8 8   CREATININE  --  0.58 0.73 0.57 0.45 0.61  CALCIUM   --  8.2* 8.4* 9.0 8.9 8.7*  MG 2.0  --   --   --   --   --    GFR: Estimated Creatinine Clearance: 116.3 mL/min (by C-G formula based on SCr of 0.61 mg/dL). Liver Function Tests: Recent Labs  Lab 12/06/23 0416 12/07/23 0306 12/08/23 0358 12/09/23 0342 12/10/23 0346  AST 13* 14* 12* 16 19  ALT 12 12 12 14 17   ALKPHOS 62 57 52 50 52  BILITOT 0.5 0.4 0.7 0.6 0.5  PROT 5.8* 5.8* 6.6 6.2* 6.4*  ALBUMIN  1.7* 1.7* 2.4* 2.0* 2.0*   Recent  Labs  Lab 12/04/23 0927  LIPASE 31   No results for input(s): AMMONIA in the last 168 hours. Coagulation Profile: Recent Labs  Lab 12/04/23 1454  INR 1.2   Cardiac Enzymes: No results for input(s): CKTOTAL, CKMB, CKMBINDEX, TROPONINI in the last 168 hours. BNP (last 3 results) No results for input(s): PROBNP in the last 8760 hours. HbA1C: No results for input(s): HGBA1C in the last 72 hours.  CBG: Recent Labs  Lab 12/09/23 0611 12/09/23 1157 12/09/23 1609 12/09/23 2133 12/10/23 0626  GLUCAP 99 125* 112* 114* 109*   Lipid Profile: No results for input(s): CHOL, HDL, LDLCALC, TRIG, CHOLHDL, LDLDIRECT in the last 72 hours. Thyroid Function Tests: No results for input(s): TSH, T4TOTAL, FREET4, T3FREE, THYROIDAB in the last 72 hours.  Anemia Panel: No results for input(s): VITAMINB12, FOLATE, FERRITIN, TIBC, IRON , RETICCTPCT in the last 72 hours. Urine analysis:    Component Value Date/Time   COLORURINE YELLOW 12/05/2023 1720   APPEARANCEUR CLEAR 12/05/2023 1720   LABSPEC >1.046 (H) 12/05/2023 1720   PHURINE 5.0 12/05/2023 1720   GLUCOSEU NEGATIVE 12/05/2023 1720   HGBUR MODERATE (A) 12/05/2023 1720   BILIRUBINUR NEGATIVE 12/05/2023 1720   BILIRUBINUR negative 09/20/2023 1638   BILIRUBINUR negative 09/14/2021 0839   KETONESUR NEGATIVE 12/05/2023 1720   PROTEINUR 30 (A) 12/05/2023 1720   UROBILINOGEN 0.2 09/20/2023 1638   NITRITE NEGATIVE 12/05/2023 1720   LEUKOCYTESUR NEGATIVE 12/05/2023 1720   Sepsis Labs: @LABRCNTIP (procalcitonin:4,lacticidven:4)  ) Recent Results (from the past 240 hours)  Culture, blood (routine x 2)     Status: None   Collection Time: 12/04/23 11:47 AM   Specimen: BLOOD  Result Value Ref Range Status   Specimen Description   Final    BLOOD LEFT ANTECUBITAL Performed at [facility], [address], [place], [place] [address]    Special Requests   Final    BOTTLES  DRAWN AEROBIC AND ANAEROBIC Blood Culture adequate volume Performed at [facility], [address], [place], [place] [address]    Culture   Final    NO GROWTH 5 DAYS Performed at [facility], [address], [place], [place] [address]    Report Status 12/09/2023 FINAL  Final  Aerobic/Anaerobic Culture w Gram Stain (surgical/deep wound)     Status: None   Collection Time: 12/04/23  6:14 PM   Specimen: Wound; Body Fluid  Result Value Ref Range Status   Specimen Description WOUND  Final   Special Requests NONE  Final   Gram Stain   Final    RARE WBC PRESENT, PREDOMINANTLY PMN NO ORGANISMS SEEN    Culture   Final    FEW GROUP B STREP(S.AGALACTIAE)ISOLATED TESTING AGAINST S.  AGALACTIAE NOT ROUTINELY PERFORMED DUE TO PREDICTABILITY OF AMP/PEN/VAN SUSCEPTIBILITY. NO ANAEROBES ISOLATED Performed at [facility], [address] [place], [place] [address]    Report Status 12/09/2023 FINAL  Final     Radiology Studies: CT ABDOMEN PELVIS W CONTRAST Result Date: 12/09/2023 CLINICAL DATA:  Intra-abdominal infection/peritonitis (Ped 0-17y) EXAM: CT ABDOMEN AND PELVIS WITH CONTRAST TECHNIQUE: Multidetector CT imaging of the abdomen and pelvis was performed using the standard protocol following bolus administration of intravenous contrast. RADIATION DOSE REDUCTION: This exam was performed according to the departmental dose-optimization program which includes automated exposure control, adjustment of the mA and/or kV according to patient size and/or use of iterative reconstruction technique. CONTRAST:  75mL OMNIPAQUE  IOHEXOL  350 MG/ML SOLN COMPARISON:  December 04, 2023, November 24, 2023 FINDINGS: Lower chest: Elevation of the right hemidiaphragm. Small right pleural effusion with dense consolidation in the right middle and right lower lobes containing air bronchograms. Subsegmental atelectasis in the lingula and left lower lobe. Hepatobiliary: No mass.No radiopaque stones  or wall thickening of the gallbladder. No intrahepatic or extrahepatic biliary ductal dilation. The portal veins are patent. Pancreas: No mass or main ductal dilation. No peripancreatic inflammation or fluid collection. Spleen: Normal size. No mass. Adrenals/Urinary Tract: No adrenal masses. No renal mass. No nephrolithiasis or hydronephrosis. No bladder wall thickening. Small amount of nondependent gas in the bladder lumen, likely due to recent catheterization. Stomach/Bowel: The stomach is decompressed without focal abnormality. No small bowel obstruction. Fluid-filled dilation of the small bowel and proximal colon, possibly due to an adynamic ileus.Mild wall thickening of multiple segments in the left abdomen.Normal appendix. Descending and sigmoid colonic diverticulosis. No changes of acute diverticulitis. Vascular/Lymphatic: No aortic aneurysm. No intraabdominal or pelvic lymphadenopathy. Reproductive: Interval hysterectomy.Postsurgical drain in the pelvis. No free pelvic fluid. Other: No pneumoperitoneum. There is a 12.2 x 13.6 x 4.5 cm (APxTRxCC) subdiaphragmatic fluid collection exerting significant mass effect on the right hepatic dome. A couple of small soft tissue nodules are noted along the superior aspect of this fluid collection (coronal 64-66), measuring up to 8 mm. Curvilinear fluid collection along the anterior right omentum, measuring 8.1 cm in craniocaudal dimension (axial 49-57, coronal 86). Musculoskeletal: No acute fracture or destructive lesion. Midline ventral pelvic incision without associated fluid collection. There is a fat containing supraumbilical ventral hernia containing intra-abdominal fat, which appears inflamed, likely postsurgical change. Multilevel degenerative disc disease of the spine. IMPRESSION: 1. Right subdiaphragmatic fluid collection exerting significant mass effect on the right hepatic dome, measuring 12.2 x 13.6 x 4.5 cm (APxTRxCC), worrisome for abscess. A couple of  small soft tissue nodules are noted along the superior aspect of this fluid collection (coronal 64-66), measuring up to 8 mm, possibly reflecting infectious debris or blood products. Correlation with fluid sampling recommended to exclude malignant cells. 2. Curvilinear fluid collection along the anterior right lateral omentum, measuring 8.1 cm in craniocaudal dimension (axial 49-57, coronal 86), 1.2 cm thick, probably a developing abscess. 3. Mild wall thickening of multiple segments of small bowel in the left abdomen, likely reflecting reactive changes. Fluid-filled dilation of the small bowel and proximal colon, possibly due to an adynamic ileus. 4. Postsurgical drain in the rectouterine pouch. No associated fluid collection or abscess. 5. Small right pleural effusion with dense consolidation in the right middle and right lower lobes, most likely compressive atelectasis. Electronically Signed   By: Rogelia Myers M.D.   On: 12/09/2023 18:30     Scheduled Meds:  chewing gum (ORBIT) sugar free  1 Stick Oral QID   Chlorhexidine  Gluconate Cloth  6 each Topical Daily   enoxaparin  (LOVENOX ) injection  60 mg Subcutaneous Q24H   famotidine   20 mg Oral BID   feeding supplement  237 mL Oral BID BM   insulin  aspart  0-15 Units Subcutaneous TID WC   lactulose   20 g Oral BID   metroNIDAZOLE   500 mg Oral Q12H   pantoprazole   40 mg Oral Q1200   polyethylene glycol  17 g Oral QODAY   potassium chloride   40 mEq Oral BID   prenatal multivitamin  1 tablet Oral Q1200   simethicone   80 mg Oral TID   spironolactone   25 mg Oral Daily   torsemide   40 mg Oral Daily   Continuous Infusions:   ceFAZolin  (ANCEF ) IV 2 g (12/10/23 0641)     LOS: 6 days    Time spent:    Sigurd Pac, MD [facility]   12/10/2023, 11:22 AM

## 2023-12-10 NOTE — Consult Note (Signed)
 Chief Complaint: RUQ fluid collection s/p total abdominal hysterectomy and bilateral salpingo-oophorectomy - IR consulted for image guided drain placement  Referring Provider(s): Jayne Vonn DEL, MD   Supervising Physician: Hughes Simmonds  Patient Status: [facility] - In-pt  History of Present Illness: Amanda Paul is a 48 y.o. female with hx of uterine fibroids. Presented to the ED 8/24 with acute abdominal pain, with CT abd/pelvis showing enlarged uterus with multiple calcified fibroids, moderate ascites with fluid appearance concerning for debris, or hemorrhage. OBGYN was consulted and pt underwent total abdominal hysterectomy and bilateral salpingo-oophorectomy on 12/04/23. Pt was found to have uterine rupture from necrotic fibroid with resulting chemical peritonitis. A surgical JP drain was left at time of procedure.  Repeat CT scan yesterday, 8/29, shows a right subdiaphragmatic fluid collecting with significant mass effect on the right hepatic done concerning for abscess. IR has now been consulted for image guided drain placement of this collection.   Case and imaging reviewed with IR attending Dr. Hughes with approval and tentative plan to proceed with drain placement tomorrow, 8/31. Pt to be NPO at midnight.   Patient is Full Code  Past Medical History:  Diagnosis Date   Allergy    Arthritis    Hernia, umbilical    Hyperlipidemia    Hypertension    Low grade squamous intraepithelial lesion (LGSIL) on cervicovaginal cytologic smear 06/10/2000   Morbid obesity (HCC)    Pain in right hand 01/19/2019   Prediabetes 03/21/2023   Umbilical hernia 04/14/2012   Uterine fibroids     Past Surgical History:  Procedure Laterality Date   ADVANCEMENT / RECONSTRUCTION POSTERIOR TIBIAL TENDON / BENAY Left 10/09/2021   COLONOSCOPY WITH PROPOFOL  N/A 06/13/2020   Procedure: COLONOSCOPY WITH PROPOFOL ;  Surgeon: Rollin Dover, MD;  Location: [facility];  Service: Endoscopy;   Laterality: N/A;   HEMOSTASIS CLIP PLACEMENT  06/13/2020   Procedure: HEMOSTASIS CLIP PLACEMENT;  Surgeon: Rollin Dover, MD;  Location: [facility];  Service: Endoscopy;;   HERNIA REPAIR     HYSTERECTOMY ABDOMINAL WITH SALPINGO-OOPHORECTOMY N/A 12/04/2023   Procedure: HYSTERECTOMY, ABDOMINAL, WITH SALPINGO-OOPHORECTOMY;  Surgeon: Jayne Vonn DEL, MD;  Location: [facility] OR;  Service: Gynecology;  Laterality: N/A;   HYSTEROSCOPY WITH D & C N/A 08/26/2014   Procedure: DILATATION AND CURETTAGE /HYSTEROSCOPY and cervical repair of cervical laceration ;  Surgeon: Nena App, MD;  Location: [facility] ORS;  Service: Gynecology;  Laterality: N/A;   IR ANGIOGRAM PELVIS SELECTIVE OR SUPRASELECTIVE  10/08/2020   IR ANGIOGRAM PELVIS SELECTIVE OR SUPRASELECTIVE  10/08/2020   IR ANGIOGRAM SELECTIVE EACH ADDITIONAL VESSEL  10/08/2020   IR EMBO TUMOR ORGAN ISCHEMIA INFARCT INC GUIDE ROADMAPPING  10/08/2020   IR RADIOLOGIST EVAL & MGMT  07/10/2020   IR RADIOLOGIST EVAL & MGMT  07/31/2020   IR RADIOLOGIST EVAL & MGMT  09/09/2020   IR RADIOLOGIST EVAL & MGMT  11/18/2020   IR RADIOLOGIST EVAL & MGMT  09/14/2021   IR US  GUIDE VASC ACCESS RIGHT  10/08/2020   POLYPECTOMY  06/13/2020   Procedure: POLYPECTOMY;  Surgeon: Rollin Dover, MD;  Location: [facility];  Service: Endoscopy;;   UMBILICAL HERNIA REPAIR     WISDOM TOOTH EXTRACTION      Allergies: Amoxicillin, Doxycycline  hyclate, and Penicillins  Medications: Prior to Admission medications   Medication Sig Start Date End Date Taking? Authorizing Provider  amLODipine  (NORVASC ) 10 MG tablet TAKE 1 TABLET BY MOUTH EVERY DAY 06/27/23  Yes Jarold Medici, MD  cetirizine (ZYRTEC) 10 MG tablet  Take 10 mg by mouth daily.   Yes [provider]  Cholecalciferol (VITAMIN D ) 50 MCG (2000 UT) CAPS Take 2,000 Units by mouth at bedtime.   Yes [provider]  ibuprofen  (ADVIL ) 600 MG tablet Take 1 tablet (600 mg total) by mouth every 6 (six) hours as  needed. Patient taking differently: Take 600 mg by mouth 2 (two) times daily as needed for mild pain (pain score 1-3) or moderate pain (pain score 4-6). 11/24/23  Yes Nivia Colon, PA-C  Multiple Vitamins-Minerals (MULTIVITAMIN WITH MINERALS) tablet Take 1 tablet by mouth daily.   Yes [provider]  rosuvastatin  (CRESTOR ) 20 MG tablet Take 1 tablet (20 mg total) by mouth daily. Patient taking differently: Take 20 mg by mouth at bedtime. 09/20/23 09/19/24 Yes Jarold Medici, MD  azithromycin  (ZITHROMAX ) 250 MG tablet Take 2 tablets together on day 1, then take 1 tablet daily for 4 days Patient not taking: Reported on 12/06/2023 09/24/23   Rising, Asberry, PA-C  benzonatate  (TESSALON  PERLES) 100 MG capsule Take 1 capsule (100 mg total) by mouth 3 (three) times daily as needed for cough. Patient not taking: Reported on 12/06/2023 10/30/23 10/29/24  Christopher Savannah, PA-C  furosemide  (LASIX ) 40 MG tablet TAKE 1 TABLET BY MOUTH EVERY DAY AS NEEDED 12/08/23   Jarold Medici, MD  meloxicam  (MOBIC ) 15 MG tablet TAKE 1 TABLET (15 MG TOTAL) BY MOUTH DAILY. Patient not taking: Reported on 12/06/2023 09/19/23   Hyatt, Max T, DPM  polyethylene glycol-electrolytes (NULYTELY ) 420 g solution Take 4,000 mLs by mouth once. Patient not taking: No sig reported 09/16/23   [provider]  predniSONE  (DELTASONE ) 20 MG tablet Take 2 tablets daily with breakfast. Patient not taking: Reported on 12/06/2023 10/30/23   Christopher Savannah, PA-C     Family History  Problem Relation Age of Onset   Hypertension Mother    Colon polyps Neg Hx    Colon cancer Neg Hx    Esophageal cancer Neg Hx    Rectal cancer Neg Hx    Stomach cancer Neg Hx     Social History   Socioeconomic History   Marital status: Single    Spouse name: Not on file   Number of children: Not on file   Years of education: Not on file   Highest education level: Some college, no degree  Occupational History   Not on file  Tobacco Use   Smoking status:  Never   Smokeless tobacco: Never  Vaping Use   Vaping status: Never Used  Substance and Sexual Activity   Alcohol use: Yes    Alcohol/week: 1.0 standard drink of alcohol    Types: 1 Cans of beer per week    Comment: rare   Drug use: No   Sexual activity: Not Currently  Other Topics Concern   Not on file  Social History Narrative   Marital status: single      Children: none      Lives: alone      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         Social Drivers of Health   Financial Resource Strain: Low Risk  (11/16/2023)   Overall Financial Resource Strain (CARDIA)    Difficulty of Paying Living Expenses: Not hard at all  Food Insecurity: No Food Insecurity (12/05/2023)   Hunger Vital Sign    Worried About Running Out of Food in the Last Year: Never true    Ran Out of Food in the Last Year: Never  true  Transportation Needs: No Transportation Needs (12/05/2023)   PRAPARE - Administrator, Civil Service (Medical): No    Lack of Transportation (Non-Medical): No  Physical Activity: Insufficiently Active (11/16/2023)   Exercise Vital Sign    Days of Exercise per Week: 1 day    Minutes of Exercise per Session: 10 min  Stress: No Stress Concern Present (11/16/2023)   [employer] of Occupational Health - Occupational Stress Questionnaire    Feeling of Stress: Only a little  Social Connections: Moderately Isolated (11/16/2023)   Social Connection and Isolation Panel    Frequency of Communication with Friends and Family: More than three times a week    Frequency of Social Gatherings with Friends and Family: Three times a week    Attends Religious Services: 1 to 4 times per year    Active Member of Clubs or Organizations: No    Attends Engineer, structural: Not on file    Marital Status: Never married     Review of Systems: A 12 point ROS discussed and pertinent positives are indicated in the HPI above.  All other systems are negative.  Review of Systems   Constitutional:  Negative for chills and fever.  Respiratory:  Negative for shortness of breath.   Cardiovascular:  Negative for chest pain.  Gastrointestinal:  Positive for abdominal pain (post-surgical soreness and LUQ pain).  Genitourinary:  Negative for dysuria.  Neurological:  Negative for dizziness.    Vital Signs: BP 106/72   Pulse 100   Temp 99.2 F (37.3 C) (Oral)   Resp 20   Wt 294 lb (133.4 kg)   LMP 09/14/2023   SpO2 (!) 88%   BMI 48.92 kg/m    Physical Exam Vitals reviewed.  Constitutional:      Appearance: She is obese.  HENT:     Mouth/Throat:     Mouth: Mucous membranes are moist.     Pharynx: Oropharynx is clear.  Cardiovascular:     Rate and Rhythm: Normal rate and regular rhythm.     Heart sounds: Normal heart sounds.  Pulmonary:     Effort: Pulmonary effort is normal.     Breath sounds: Normal breath sounds.  Abdominal:     General: Abdomen is flat.     Palpations: Abdomen is soft.     Tenderness: There is abdominal tenderness (appropriately tender throughout; no RUQ tenderness).     Comments: Midline incision with overlying dressing healing well LLQ surgical drain in place to suction w/ ~10cc serosanguinous fluid  Skin:    General: Skin is warm and dry.  Neurological:     Mental Status: She is alert and oriented to person, place, and time.  Psychiatric:        Behavior: Behavior normal.     Imaging: CT ABDOMEN PELVIS W CONTRAST Result Date: 12/09/2023 CLINICAL DATA:  Intra-abdominal infection/peritonitis (Ped 0-17y) EXAM: CT ABDOMEN AND PELVIS WITH CONTRAST TECHNIQUE: Multidetector CT imaging of the abdomen and pelvis was performed using the standard protocol following bolus administration of intravenous contrast. RADIATION DOSE REDUCTION: This exam was performed according to the departmental dose-optimization program which includes automated exposure control, adjustment of the mA and/or kV according to patient size and/or use of iterative  reconstruction technique. CONTRAST:  75mL OMNIPAQUE  IOHEXOL  350 MG/ML SOLN COMPARISON:  December 04, 2023, November 24, 2023 FINDINGS: Lower chest: Elevation of the right hemidiaphragm. Small right pleural effusion with dense consolidation in the right middle and right lower lobes containing  air bronchograms. Subsegmental atelectasis in the lingula and left lower lobe. Hepatobiliary: No mass.No radiopaque stones or wall thickening of the gallbladder. No intrahepatic or extrahepatic biliary ductal dilation. The portal veins are patent. Pancreas: No mass or main ductal dilation. No peripancreatic inflammation or fluid collection. Spleen: Normal size. No mass. Adrenals/Urinary Tract: No adrenal masses. No renal mass. No nephrolithiasis or hydronephrosis. No bladder wall thickening. Small amount of nondependent gas in the bladder lumen, likely due to recent catheterization. Stomach/Bowel: The stomach is decompressed without focal abnormality. No small bowel obstruction. Fluid-filled dilation of the small bowel and proximal colon, possibly due to an adynamic ileus.Mild wall thickening of multiple segments in the left abdomen.Normal appendix. Descending and sigmoid colonic diverticulosis. No changes of acute diverticulitis. Vascular/Lymphatic: No aortic aneurysm. No intraabdominal or pelvic lymphadenopathy. Reproductive: Interval hysterectomy.Postsurgical drain in the pelvis. No free pelvic fluid. Other: No pneumoperitoneum. There is a 12.2 x 13.6 x 4.5 cm (APxTRxCC) subdiaphragmatic fluid collection exerting significant mass effect on the right hepatic dome. A couple of small soft tissue nodules are noted along the superior aspect of this fluid collection (coronal 64-66), measuring up to 8 mm. Curvilinear fluid collection along the anterior right omentum, measuring 8.1 cm in craniocaudal dimension (axial 49-57, coronal 86). Musculoskeletal: No acute fracture or destructive lesion. Midline ventral pelvic incision without  associated fluid collection. There is a fat containing supraumbilical ventral hernia containing intra-abdominal fat, which appears inflamed, likely postsurgical change. Multilevel degenerative disc disease of the spine. IMPRESSION: 1. Right subdiaphragmatic fluid collection exerting significant mass effect on the right hepatic dome, measuring 12.2 x 13.6 x 4.5 cm (APxTRxCC), worrisome for abscess. A couple of small soft tissue nodules are noted along the superior aspect of this fluid collection (coronal 64-66), measuring up to 8 mm, possibly reflecting infectious debris or blood products. Correlation with fluid sampling recommended to exclude malignant cells. 2. Curvilinear fluid collection along the anterior right lateral omentum, measuring 8.1 cm in craniocaudal dimension (axial 49-57, coronal 86), 1.2 cm thick, probably a developing abscess. 3. Mild wall thickening of multiple segments of small bowel in the left abdomen, likely reflecting reactive changes. Fluid-filled dilation of the small bowel and proximal colon, possibly due to an adynamic ileus. 4. Postsurgical drain in the rectouterine pouch. No associated fluid collection or abscess. 5. Small right pleural effusion with dense consolidation in the right middle and right lower lobes, most likely compressive atelectasis. Electronically Signed   By: Rogelia Myers M.D.   On: 12/09/2023 18:30   DG CHEST PORT 1 VIEW Result Date: 12/08/2023 CLINICAL DATA:  [id number] Hypoxia [id number] EXAM: PORTABLE CHEST - 1 VIEW COMPARISON:  December 05, 2023 FINDINGS: Low lung volumes with elevation of the right hemidiaphragm. Patchy bibasilar airspace opacities. Similar right pleural effusion. No pneumothorax. No cardiomegaly. Tortuous aorta with aortic atherosclerosis. No acute fracture or destructive lesions. Multilevel thoracic osteophytosis. IMPRESSION: Low lung volumes. Similar patchy airspace disease in both lung bases with small right pleural effusion. Electronically Signed    By: Rogelia Myers M.D.   On: 12/08/2023 11:19   ECHOCARDIOGRAM COMPLETE Result Date: 12/06/2023    ECHOCARDIOGRAM REPORT   Patient Name:   Amanda Paul Date of Exam: 12/06/2023 Medical Rec #:  [id number]                 Height:       65.0 in Accession #:    [id number]  Weight:       294.5 lb Date of Birth:  June 12, 1975                 BSA:          2.332 m Patient Age:    48 years                  BP:           115/67 mmHg Patient Gender: F                         HR:           100 bpm. Exam Location:  Inpatient Procedure: 2D Echo, Cardiac Doppler and Color Doppler (Both Spectral and Color            Flow Doppler were utilized during procedure). Indications:    CHF  History:        Patient has prior history of Echocardiogram examinations, most                 recent 08/10/2023. CHF, CAD, Signs/Symptoms:Edema; Risk                 Factors:Hypertension, Dyslipidemia and Non-Smoker.  Sonographer:    Juliene Rucks Sonographer#2:  Ellouise Mose RDCS Referring Phys: [id number] LAWRENCE A BASS IMPRESSIONS  1. Left ventricular ejection fraction, by estimation, is 55%. The left ventricle has normal function. Left ventricular endocardial border not optimally defined to evaluate regional wall motion. Left ventricular diastolic parameters are consistent with Grade II diastolic dysfunction (pseudonormalization).  2. D-shaped interventricular septum suggestive of RV pressure/volume overload. Right ventricular systolic function is mildly reduced. The right ventricular size is moderately enlarged. There is moderately elevated pulmonary artery systolic pressure. The  estimated right ventricular systolic pressure is 49.1 mmHg.  3. The mitral valve is normal in structure. No evidence of mitral valve regurgitation. No evidence of mitral stenosis.  4. The aortic valve is tricuspid. Aortic valve regurgitation is not visualized. No aortic stenosis is present.  5. The inferior vena cava is dilated in size with <50%  respiratory variability, suggesting right atrial pressure of 15 mmHg.  6. RV dysfunction, would rule out PE. FINDINGS  Left Ventricle: Left ventricular ejection fraction, by estimation, is 55%. The left ventricle has normal function. Left ventricular endocardial border not optimally defined to evaluate regional wall motion. The left ventricular internal cavity size was normal in size. There is no left ventricular hypertrophy. Left ventricular diastolic parameters are consistent with Grade II diastolic dysfunction (pseudonormalization). Right Ventricle: D-shaped interventricular septum suggestive of RV pressure/volume overload. The right ventricular size is moderately enlarged. No increase in right ventricular wall thickness. Right ventricular systolic function is mildly reduced. There is moderately elevated pulmonary artery systolic pressure. The tricuspid regurgitant velocity is 2.92 m/s, and with an assumed right atrial pressure of 15 mmHg, the estimated right ventricular systolic pressure is 49.1 mmHg. Left Atrium: Left atrial size was normal in size. Right Atrium: Right atrial size was normal in size. Pericardium: Trivial pericardial effusion is present. Mitral Valve: The mitral valve is normal in structure. No evidence of mitral valve regurgitation. No evidence of mitral valve stenosis. Tricuspid Valve: The tricuspid valve is normal in structure. Tricuspid valve regurgitation is trivial. Aortic Valve: The aortic valve is tricuspid. Aortic valve regurgitation is not visualized. No aortic stenosis is present. Aortic valve mean gradient measures 6.0 mmHg. Aortic valve peak gradient measures 12.5 mmHg. Aortic  valve area, by VTI measures 3.23  cm. Pulmonic Valve: The pulmonic valve was normal in structure. Pulmonic valve regurgitation is not visualized. Aorta: The aortic root is normal in size and structure. Venous: The inferior vena cava is dilated in size with less than 50% respiratory variability, suggesting  right atrial pressure of 15 mmHg. IAS/Shunts: No atrial level shunt detected by color flow Doppler.  LEFT VENTRICLE PLAX 2D LVIDd:         5.40 cm      Diastology LVIDs:         3.60 cm      LV e' medial:    7.62 cm/s LV PW:         1.00 cm      LV E/e' medial:  12.3 LV IVS:        1.00 cm      LV e' lateral:   15.20 cm/s LVOT diam:     2.10 cm      LV E/e' lateral: 6.1 LV SV:         85 LV SV Index:   36 LVOT Area:     3.46 cm  LV Volumes (MOD) LV vol d, MOD A2C: 120.0 ml LV vol d, MOD A4C: 114.0 ml LV vol s, MOD A2C: 47.8 ml LV vol s, MOD A4C: 45.5 ml LV SV MOD A2C:     72.2 ml LV SV MOD A4C:     114.0 ml LV SV MOD BP:      76.1 ml RIGHT VENTRICLE            IVC RV S prime:     9.64 cm/s  IVC diam: 2.30 cm TAPSE (M-mode): 1.4 cm LEFT ATRIUM             Index        RIGHT ATRIUM           Index LA diam:        3.60 cm 1.54 cm/m   RA Area:     17.80 cm LA Vol (A2C):   36.9 ml 15.82 ml/m  RA Volume:   48.50 ml  20.80 ml/m LA Vol (A4C):   42.3 ml 18.14 ml/m LA Biplane Vol: 40.0 ml 17.15 ml/m  AORTIC VALVE AV Area (Vmax):    2.86 cm AV Area (Vmean):   2.81 cm AV Area (VTI):     3.23 cm AV Vmax:           177.00 cm/s AV Vmean:          115.000 cm/s AV VTI:            0.263 m AV Peak Grad:      12.5 mmHg AV Mean Grad:      6.0 mmHg LVOT Vmax:         146.00 cm/s LVOT Vmean:        93.300 cm/s LVOT VTI:          0.245 m LVOT/AV VTI ratio: 0.93  AORTA Ao Root diam: 3.10 cm Ao Asc diam:  2.50 cm MITRAL VALVE               TRICUSPID VALVE MV Area (PHT): 4.49 cm    TR Peak grad:   34.1 mmHg MV Decel Time: 169 msec    TR Vmax:        292.00 cm/s MV E velocity: 93.40 cm/s MV A velocity: 75.70 cm/s  SHUNTS MV E/A ratio:  1.23  Systemic VTI:  0.24 m                            Systemic Diam: 2.10 cm Dalton McleanMD Electronically signed by Ezra Kanner Signature Date/Time: 12/06/2023/3:47:53 PM    Final    CT Angio Chest Pulmonary Embolism (PE) W or WO Contrast Result Date: 12/05/2023 CLINICAL DATA:   Pulmonary embolus suspected with high probability. EXAM: CT ANGIOGRAPHY CHEST WITH CONTRAST TECHNIQUE: Multidetector CT imaging of the chest was performed using the standard protocol during bolus administration of intravenous contrast. Multiplanar CT image reconstructions and MIPs were obtained to evaluate the vascular anatomy. RADIATION DOSE REDUCTION: This exam was performed according to the departmental dose-optimization program which includes automated exposure control, adjustment of the mA and/or kV according to patient size and/or use of iterative reconstruction technique. CONTRAST:  OMNIPAQUE  IOHEXOL  350 MG/ML SOLN COMPARISON:  Chest radiograph 12/05/2023.  CT chest 12/08/2022 FINDINGS: Cardiovascular: Examination is technically indeterminate for evaluation of the pulmonary arteries for possible pulmonary embolus. Poor contrast bolus despite reinjection attempt. As visualized, no large central pulmonary artery emboli are identified but significant embolus could be obscured due to poor technique. Cardiac enlargement. No pericardial effusions. Normal caliber thoracic aorta. Mild aortic calcification. Mediastinum/Nodes: No enlarged mediastinal, hilar, or axillary lymph nodes. Thyroid gland, trachea, and esophagus demonstrate no significant findings. Lungs/Pleura: Motion artifact limits evaluation. Shallow inspiration. Small left pleural effusion. Atelectasis or consolidation in both lung bases. This could indicate compressive atelectasis or pneumonia. Aspiration would be a secondary consideration. No pleural effusion or pneumothorax. Upper Abdomen: No acute abnormalities demonstrated. Musculoskeletal: Degenerative changes in the spine. No acute bony abnormalities. Review of the MIP images confirms the above findings. IMPRESSION: 1. Technically indeterminate examination for evaluation of the pulmonary arteries due to poor contrast bolus despite reinjection. This may be related to heart failure. 2. Cardiac  enlargement. 3. Right pleural effusion. 4. Bilateral basilar are atelectasis or consolidation. Electronically Signed   By: Elsie Gravely M.D.   On: 12/05/2023 15:58   DG CHEST PORT 1 VIEW Result Date: 12/05/2023 CLINICAL DATA:  Postoperative hypoxia.  Status post hysterectomy. EXAM: PORTABLE CHEST 1 VIEW COMPARISON:  10/30/2023 and abdomen and pelvis CT dated 11/24/2023. Coronary calcium  score CT dated 08/10/2023. FINDINGS: Stable elevated right hemidiaphragm with interval mild borderline enlargement of the cardiac silhouette. Tortuous and partially calcified thoracic aorta. Mild right and moderate left basilar patchy and linear density and interval small right pleural effusion. Unremarkable bones. IMPRESSION: 1. Mild right and moderate left basilar atelectasis and possible pneumonia/aspiration pneumonitis. 2. Small right pleural effusion. 3. Interval borderline cardiomegaly. Electronically Signed   By: Elspeth Bathe M.D.   On: 12/05/2023 10:46   CT ABDOMEN PELVIS W CONTRAST Result Date: 12/04/2023 CLINICAL DATA:  Abdominal pain. EXAM: CT ABDOMEN AND PELVIS WITH CONTRAST TECHNIQUE: Multidetector CT imaging of the abdomen and pelvis was performed using the standard protocol following bolus administration of intravenous contrast. RADIATION DOSE REDUCTION: This exam was performed according to the departmental dose-optimization program which includes automated exposure control, adjustment of the mA and/or kV according to patient size and/or use of iterative reconstruction technique. CONTRAST:  OMNIPAQUE  IOHEXOL  300 MG/ML  SOLN COMPARISON:  11/24/2023 FINDINGS: Lower chest: Asymmetric elevation right hemidiaphragm with right base atelectasis or scarring, similar to prior. No pleural effusion. Hepatobiliary: No suspicious focal abnormality within the liver parenchyma. There is no evidence for gallstones, gallbladder wall thickening, or pericholecystic fluid. No intrahepatic or extrahepatic  biliary dilation.  Pancreas: No focal mass lesion. No dilatation of the main duct. No intraparenchymal cyst. No peripancreatic edema. Spleen: No splenomegaly. No suspicious focal mass lesion. Adrenals/Urinary Tract: No adrenal nodule or mass. Kidneys unremarkable. No evidence for hydroureter. The urinary bladder appears normal for the degree of distention. Stomach/Bowel: Small hiatal hernia. Stomach otherwise unremarkable. Duodenum is normally positioned as is the ligament of Treitz. Potential mild circumferential wall thickening in proximal jejunal loops. Distal small bowel is decompressed. The terminal ileum is normal. The appendix is not well visualized, but there is no edema or inflammation in the region of the cecal tip to suggest appendicitis. Diverticuli are seen scattered along the entire length of the colon without CT findings of diverticulitis. Vascular/Lymphatic: No abdominal aortic aneurysm. No abdominal aortic atherosclerotic calcification. There is no gastrohepatic or hepatoduodenal ligament lymphadenopathy. No retroperitoneal or mesenteric lymphadenopathy. No pelvic sidewall lymphadenopathy. Reproductive: Marked uterine enlargement with multiple calcified fibroids identified. Endometrial canal is obscured and central low attenuation in the uterus is similar to prior and presumably related to fibroid disease. MRI pelvis on 08/31/2021 documented an 8 mm endometrial stripe thickness. Uterus measures 22.2 x 14.7 x 21.3 cm. This compares to 27.6 x 18.7 x 28.7 cm when measured in a similar fashion on the 11/24/2023 exam. Comparing axial image 62/2 today to axial image 56/2 previously, the central and left fluid density component has decreased, measuring 15.2 x 10.9 cm today compared to 23.4 x 15.1 cm previously. There is marked thinning of the left mild myometrium. This decrease is also well demonstrated by comparing coronal image 62/8 today to coronal image 66/7 previously. Other: Interval development of moderate volume  ascites in the abdomen and pelvis. Attenuation of the free fluid is higher than would be expected for serosanguineous fluid and may be complicated by proteinaceous debris, infectious debris, or hemorrhage. Musculoskeletal: No worrisome lytic or sclerotic osseous abnormality. IMPRESSION: 1. Interval development of moderate volume ascites in the abdomen and pelvis. Attenuation of the free fluid is higher than would be expected for serosanguineous fluid and may be complicated by proteinaceous debris, infectious debris, or hemorrhage. This interval appearance of moderate volume intraperitoneal free fluid in the 10 days since prior studies is accompanied by definite interval decrease in size of the cystic process in the central uterus. As such, rupture of a cystic degenerated/necrotic fibroid into the peritoneal cavity would be a consideration. If the large central uterine process represents an endometrial cancer, rupture through the myometrium/serosa into the peritoneal cavity could also produce this appearance. 2. Marked uterine enlargement with multiple calcified fibroids identified. The central and left fluid density component has decreased in size since the prior study. There is marked thinning of the left myometrium. 3. Potential mild circumferential wall thickening in proximal jejunal loops. This is nonspecific but can be seen in the setting of enteritis. Reactive etiology given the new intraperitoneal free fluid is favored. 4. Small hiatal hernia. 5. Asymmetric elevation right hemidiaphragm with right base atelectasis or scarring, similar to prior. Critical Value/emergent results were called by telephone at the time of interpretation on 12/04/2023 at 11:20 am to provider JONATHAN GILLIAM , who verbally acknowledged these results. Electronically Signed   By: Camellia Candle M.D.   On: 12/04/2023 11:21   CT ABDOMEN PELVIS W CONTRAST Result Date: 11/24/2023 CLINICAL DATA:  Left upper quadrant pain EXAM: CT ABDOMEN  AND PELVIS WITH CONTRAST TECHNIQUE: Multidetector CT imaging of the abdomen and pelvis was performed using the standard protocol following bolus administration  of intravenous contrast. RADIATION DOSE REDUCTION: This exam was performed according to the departmental dose-optimization program which includes automated exposure control, adjustment of the mA and/or kV according to patient size and/or use of iterative reconstruction technique. CONTRAST:  OMNIPAQUE  IOHEXOL  300 MG/ML  SOLN COMPARISON:  12/08/2022 FINDINGS: Lower chest: Right basilar atelectasis is noted. Stable right middle lobe pleural base nodule is seen. Previous exam shows to be stable for more than 2 years and considered benign. No follow-up is recommended. Hepatobiliary: No focal liver abnormality is seen. No gallstones, gallbladder wall thickening, or biliary dilatation. Pancreas: Unremarkable. No pancreatic ductal dilatation or surrounding inflammatory changes. Spleen: Normal in size without focal abnormality. Adrenals/Urinary Tract: Adrenal glands are within normal limits. Kidneys demonstrate a normal enhancement pattern bilaterally. Normal excretion is seen. The bladder is decompressed. Stomach/Bowel: Scattered diverticular changes noted within the colon. No evidence of diverticulitis is seen. The appendix is not discretely visualized although no inflammatory changes are noted to suggest appendicitis. Stomach and small bowel are within normal limits. Vascular/Lymphatic: No significant vascular findings are present. No enlarged abdominal or pelvic lymph nodes. Reproductive: The uterus is significantly enlarged measuring approximately 28 cm in transverse diameter and 17 cm in AP dimension. This is increased from the prior exam at which time it measured 19 x 13. Rim calcified fibroid is noted on the right measuring up to 10 cm. A large rim calcified hypodense lesion is noted likely representing a necrotic fibroid. An additional rim calcified  fibroid is noted within. These changes are significantly different than that seen on the prior exam. Multiple smaller rim calcified fibroids are noted as well. No adnexal mass is seen. Other: No abdominal wall hernia or abnormality. No abdominopelvic ascites. Musculoskeletal: No acute or significant osseous findings. IMPRESSION: Significant enlargement of the uterus with what appears to be a large centrally necrotic fibroid within. This has changed significantly in the interval from the prior exam. This may contribute to the patient's current symptomatology. Surgical consultation may be warranted. Diverticulosis without diverticulitis. Mild right basilar atelectasis is seen. Electronically Signed   By: Oneil Devonshire M.D.   On: 11/24/2023 20:39    Labs:  CBC: Recent Labs    12/07/23 0306 12/08/23 0358 12/09/23 0342 12/10/23 0346  WBC 19.7* 18.2* 18.6* 20.3*  HGB 9.2* 9.6* 9.5* 9.2*  HCT 30.0* 30.3* 30.4* 29.6*  PLT 368 405* 399 415*    COAGS: Recent Labs    12/04/23 1454  INR 1.2  APTT 31    BMP: Recent Labs    12/07/23 0306 12/08/23 0358 12/09/23 0342 12/10/23 0346  NA 137 138 139 137  K 3.4* 3.2* 3.8 3.5  CL 99 96* 98 94*  CO2 29 30 29 31   GLUCOSE 106* 100* 102* 110*  BUN 10 7 8 8   CALCIUM  8.4* 9.0 8.9 8.7*  CREATININE 0.73 0.57 0.45 0.61  GFRNONAA >60 >60 >60 >60    LIVER FUNCTION TESTS: Recent Labs    12/07/23 0306 12/08/23 0358 12/09/23 0342 12/10/23 0346  BILITOT 0.4 0.7 0.6 0.5  AST 14* 12* 16 19  ALT 12 12 14 17   ALKPHOS 57 52 50 52  PROT 5.8* 6.6 6.2* 6.4*  ALBUMIN  1.7* 2.4* 2.0* 2.0*    TUMOR MARKERS: No results for input(s): AFPTM, CEA, CA199, CHROMGRNA in the last 8760 hours.  Assessment and Plan:  Amanda Paul is a 48 y.o. female with hx of uterine fibroids. Presented to the ED 8/24 with acute abdominal pain, with CT abd/pelvis  showing enlarged uterus with multiple calcified fibroids, moderate ascites with fluid  appearance concerning for debris, or hemorrhage. OBGYN was consulted and pt underwent total abdominal hysterectomy and bilateral salpingo-oophorectomy on 12/04/23. Pt was found to have uterine rupture from necrotic fibroid with resulting chemical peritonitis. A surgical JP drain was left at time of procedure.  Repeat CT scan yesterday, 8/29, shows a right subdiaphragmatic fluid collecting with significant mass effect on the right hepatic done concerning for abscess. IR has now been consulted for image guided drain placement of this collection.   Case and imaging reviewed with IR attending Dr. Hughes with approval and tentative plan to proceed with drain placement tomorrow, 8/31. Pt to be NPO at midnight.  -Persistent leukocytosis; WBC 20.3 today -INR 1.2 -NPO at midnight  -Tentative plan for RUQ drain placement in CT on 8/31 with Dr. JINNY Hughes   Risks and benefits discussed with the patient including bleeding, infection, damage to adjacent structures, bowel perforation/fistula connection, and sepsis. All of the patient's questions were answered, patient is agreeable to proceed. Consent signed and in chart.   Thank you for allowing our service to participate in [facility] 's care.  Electronically Signed: Tifini Reeder M Astella Desir, PA-C 12/10/2023, 2:22 PM       I spent a total of 40 Minutes    in face to face in clinical consultation, greater than 50% of which was counseling/coordinating care for RUQ abdominal abscess drain placement.

## 2023-12-10 NOTE — Progress Notes (Addendum)
 Subjective: No new complaints   Antibiotics:  Anti-infectives (From admission, onward)    Start     Dose/Rate Route Frequency Ordered Stop   12/09/23 1700  cefUROXime  (CEFTIN ) tablet 500 mg  Status:  Discontinued        500 mg Oral 2 times daily with meals 12/09/23 0816 12/09/23 0944   12/09/23 1400  ceFAZolin  (ANCEF ) IVPB 2g/100 mL premix        2 g 200 mL/hr over 30 Minutes Intravenous Every 8 hours 12/09/23 0944     12/09/23 1030  metroNIDAZOLE  (FLAGYL ) tablet 500 mg        500 mg Oral Every 12 hours 12/09/23 0944     12/06/23 0600  vancomycin  (VANCOREADY) IVPB 750 mg/150 mL  Status:  Discontinued        750 mg 150 mL/hr over 60 Minutes Intravenous Every 8 hours 12/05/23 2142 12/08/23 1235   12/05/23 2145  vancomycin  (VANCOCIN ) 2,500 mg in sodium chloride  0.9 % 500 mL IVPB        2,500 mg 262.5 mL/hr over 120 Minutes Intravenous  Once 12/05/23 2053 12/06/23 0004   12/05/23 2145  ceFEPIme  (MAXIPIME ) 2 g in sodium chloride  0.9 % 100 mL IVPB  Status:  Discontinued        2 g 200 mL/hr over 30 Minutes Intravenous Every 8 hours 12/05/23 2053 12/09/23 0816   12/05/23 0600  ceFAZolin  (ANCEF ) IVPB 2g/100 mL premix  Status:  Discontinued        2 g 200 mL/hr over 30 Minutes Intravenous On call to O.R. 12/04/23 1527 12/04/23 1533   12/04/23 1615  clindamycin  (CLEOCIN ) IVPB 900 mg        900 mg 100 mL/hr over 30 Minutes Intravenous  Once 12/04/23 1525 12/04/23 1702   12/04/23 1615  gentamicin  (GARAMYCIN ) 440 mg in dextrose  5 % 100 mL IVPB  Status:  Discontinued        5 mg/kg  87.6 kg (Adjusted) 111 mL/hr over 60 Minutes Intravenous  Once 12/04/23 1525 12/04/23 2037   12/04/23 1600  gentamicin  (GARAMYCIN ) 670 mg, clindamycin  (CLEOCIN ) 900 mg in dextrose  5 % 100 mL IVPB  Status:  Discontinued        670 mg 245.5 mL/hr over 30 Minutes Intravenous  Once 12/04/23 1508 12/04/23 1745   12/04/23 1130  ciprofloxacin  (CIPRO ) IVPB 400 mg        400 mg 200 mL/hr over 60 Minutes  Intravenous  Once 12/04/23 1121 12/04/23 1257       Medications: Scheduled Meds:  chewing gum (ORBIT) sugar free  1 Stick Oral QID   Chlorhexidine  Gluconate Cloth  6 each Topical Daily   enoxaparin  (LOVENOX ) injection  60 mg Subcutaneous Q24H   famotidine   20 mg Oral BID   feeding supplement  237 mL Oral BID BM   insulin  aspart  0-15 Units Subcutaneous TID WC   lactulose   20 g Oral BID   metroNIDAZOLE   500 mg Oral Q12H   pantoprazole   40 mg Oral Q1200   polyethylene glycol  17 g Oral QODAY   potassium chloride   40 mEq Oral BID   prenatal multivitamin  1 tablet Oral Q1200   simethicone   80 mg Oral TID   spironolactone   25 mg Oral Daily   torsemide   40 mg Oral Daily   Continuous Infusions:   ceFAZolin  (ANCEF ) IV 2 g (12/10/23 0641)   PRN Meds:.acetaminophen , diphenhydrAMINE , HYDROmorphone  (DILAUDID ) injection, LORazepam , ondansetron  **OR**  ondansetron  (ZOFRAN ) IV, oxyCODONE     Objective: Weight change:   Intake/Output Summary (Last 24 hours) at 12/10/2023 1225 Last data filed at 12/09/2023 1900 Gross per 24 hour  Intake --  Output 2730 ml  Net -2730 ml   Blood pressure 106/72, pulse 100, temperature 99.2 F (37.3 C), temperature source Oral, resp. rate 20, weight 133.4 kg, last menstrual period 09/14/2023, SpO2 (!) 88%. Temp:  [98.3 F (36.8 C)-100.1 F (37.8 C)] 99.2 F (37.3 C) (08/30 1207) Pulse Rate:  [100-104] 100 (08/30 1207) Resp:  [17-24] 20 (08/30 1207) BP: (104-144)/(68-83) 106/72 (08/30 1207) SpO2:  [88 %-92 %] 88 % (08/30 0426)  Physical Exam: Physical Exam Constitutional:      General: She is not in acute distress.    Appearance: She is well-developed. She is not diaphoretic.  HENT:     Head: Normocephalic and atraumatic.     Right Ear: External ear normal.     Left Ear: External ear normal.     Mouth/Throat:     Pharynx: No oropharyngeal exudate.  Eyes:     General: No scleral icterus.    Conjunctiva/sclera: Conjunctivae normal.     Pupils:  Pupils are equal, round, and reactive to light.  Cardiovascular:     Rate and Rhythm: Normal rate and regular rhythm.  Pulmonary:     Effort: Pulmonary effort is normal. No respiratory distress.     Breath sounds: Normal breath sounds. No wheezing.  Abdominal:     General: Bowel sounds are normal. There is no distension.     Palpations: Abdomen is soft.  Musculoskeletal:        General: No tenderness. Normal range of motion.  Lymphadenopathy:     Cervical: No cervical adenopathy.  Skin:    General: Skin is warm and dry.     Coloration: Skin is not pale.     Findings: No erythema or rash.  Neurological:     General: No focal deficit present.     Mental Status: She is alert and oriented to person, place, and time.     Motor: No abnormal muscle tone.     Coordination: Coordination normal.  Psychiatric:        Mood and Affect: Mood normal.        Behavior: Behavior normal.        Thought Content: Thought content normal.        Judgment: Judgment normal.      CBC:    BMET Recent Labs    12/09/23 0342 12/10/23 0346  NA 139 137  K 3.8 3.5  CL 98 94*  CO2 29 31  GLUCOSE 102* 110*  BUN 8 8  CREATININE 0.45 0.61  CALCIUM  8.9 8.7*     Liver Panel  Recent Labs    12/09/23 0342 12/10/23 0346  PROT 6.2* 6.4*  ALBUMIN  2.0* 2.0*  AST 16 19  ALT 14 17  ALKPHOS 50 52  BILITOT 0.6 0.5       Sedimentation Rate No results for input(s): ESRSEDRATE in the last 72 hours. C-Reactive Protein No results for input(s): CRP in the last 72 hours.  Micro Results: Recent Results (from the past 720 hours)  Culture, blood (routine x 2)     Status: None   Collection Time: 12/04/23 11:47 AM   Specimen: BLOOD  Result Value Ref Range Status   Specimen Description   Final    BLOOD LEFT ANTECUBITAL Performed at [facility], [address]  [place], [place] [address]    Special Requests   Final    BOTTLES DRAWN AEROBIC AND ANAEROBIC Blood Culture  adequate volume Performed at [facility], [address] [place], [place] [address]    Culture   Final    NO GROWTH 5 DAYS Performed at [facility], [address] [place], [place] [address]    Report Status 12/09/2023 FINAL  Final  Aerobic/Anaerobic Culture w Gram Stain (surgical/deep wound)     Status: None   Collection Time: 12/04/23  6:14 PM   Specimen: Wound; Body Fluid  Result Value Ref Range Status   Specimen Description WOUND  Final   Special Requests NONE  Final   Gram Stain   Final    RARE WBC PRESENT, PREDOMINANTLY PMN NO ORGANISMS SEEN    Culture   Final    FEW GROUP B STREP(S.AGALACTIAE)ISOLATED TESTING AGAINST S. AGALACTIAE NOT ROUTINELY PERFORMED DUE TO PREDICTABILITY OF AMP/PEN/VAN SUSCEPTIBILITY. NO ANAEROBES ISOLATED Performed at [facility], [address] [place], [place] [address]    Report Status 12/09/2023 FINAL  Final    Studies/Results: CT ABDOMEN PELVIS W CONTRAST Result Date: 12/09/2023 CLINICAL DATA:  Intra-abdominal infection/peritonitis (Ped 0-17y) EXAM: CT ABDOMEN AND PELVIS WITH CONTRAST TECHNIQUE: Multidetector CT imaging of the abdomen and pelvis was performed using the standard protocol following bolus administration of intravenous contrast. RADIATION DOSE REDUCTION: This exam was performed according to the departmental dose-optimization program which includes automated exposure control, adjustment of the mA and/or kV according to patient size and/or use of iterative reconstruction technique. CONTRAST:  75mL OMNIPAQUE  IOHEXOL  350 MG/ML SOLN COMPARISON:  December 04, 2023, November 24, 2023 FINDINGS: Lower chest: Elevation of the right hemidiaphragm. Small right pleural effusion with dense consolidation in the right middle and right lower lobes containing air bronchograms. Subsegmental atelectasis in the lingula and left lower lobe. Hepatobiliary: No mass.No radiopaque stones or wall thickening of the gallbladder. No  intrahepatic or extrahepatic biliary ductal dilation. The portal veins are patent. Pancreas: No mass or main ductal dilation. No peripancreatic inflammation or fluid collection. Spleen: Normal size. No mass. Adrenals/Urinary Tract: No adrenal masses. No renal mass. No nephrolithiasis or hydronephrosis. No bladder wall thickening. Small amount of nondependent gas in the bladder lumen, likely due to recent catheterization. Stomach/Bowel: The stomach is decompressed without focal abnormality. No small bowel obstruction. Fluid-filled dilation of the small bowel and proximal colon, possibly due to an adynamic ileus.Mild wall thickening of multiple segments in the left abdomen.Normal appendix. Descending and sigmoid colonic diverticulosis. No changes of acute diverticulitis. Vascular/Lymphatic: No aortic aneurysm. No intraabdominal or pelvic lymphadenopathy. Reproductive: Interval hysterectomy.Postsurgical drain in the pelvis. No free pelvic fluid. Other: No pneumoperitoneum. There is a 12.2 x 13.6 x 4.5 cm (APxTRxCC) subdiaphragmatic fluid collection exerting significant mass effect on the right hepatic dome. A couple of small soft tissue nodules are noted along the superior aspect of this fluid collection (coronal 64-66), measuring up to 8 mm. Curvilinear fluid collection along the anterior right omentum, measuring 8.1 cm in craniocaudal dimension (axial 49-57, coronal 86). Musculoskeletal: No acute fracture or destructive lesion. Midline ventral pelvic incision without associated fluid collection. There is a fat containing supraumbilical ventral hernia containing intra-abdominal fat, which appears inflamed, likely postsurgical change. Multilevel degenerative disc disease of the spine. IMPRESSION: 1. Right subdiaphragmatic fluid collection exerting significant mass effect on the right hepatic dome, measuring 12.2 x 13.6 x 4.5 cm (APxTRxCC), worrisome for abscess. A couple of small soft tissue  nodules are noted along the  superior aspect of this fluid collection (coronal 64-66), measuring up to 8 mm, possibly reflecting infectious debris or blood products. Correlation with fluid sampling recommended to exclude malignant cells. 2. Curvilinear fluid collection along the anterior right lateral omentum, measuring 8.1 cm in craniocaudal dimension (axial 49-57, coronal 86), 1.2 cm thick, probably a developing abscess. 3. Mild wall thickening of multiple segments of small bowel in the left abdomen, likely reflecting reactive changes. Fluid-filled dilation of the small bowel and proximal colon, possibly due to an adynamic ileus. 4. Postsurgical drain in the rectouterine pouch. No associated fluid collection or abscess. 5. Small right pleural effusion with dense consolidation in the right middle and right lower lobes, most likely compressive atelectasis. Electronically Signed   By: Rogelia Myers M.D.   On: 12/09/2023 18:30      Assessment/Plan:  INTERVAL HISTORY: ET the abdomen pelvis performed   Principal Problem:   Acute hypoxic respiratory failure (HCC) Active Problems:   Fibroid   DM2 (diabetes mellitus, type 2) (HCC)   Sepsis (HCC)   CHF with unknown LVEF (HCC)   Pneumonia    Amanda Paul is a 48 y.o. female with who experienced uterine abruption with necrosis and peritonitis requiring emergency surgery total abdominal hysterectomy and bilateral salpingo oophorectomy.  Intraoperative cultures had yielded group B streptococcus.  She seemed to be doing well clinically but is having some ongoing leukocytosis.  Because of this we ordered a CT scan of the abdomen pelvis to look for occult infection  This was indeed what we found with a right subdiaphragmatic abscess with mass effect on the right hepatic dome seen that was measured 12 x 2 x 13 0.6 x 4.5 cm with several small soft tissue nodules along the superior aspect of the fluid collection with curvilinear fluid collection also along the anterior  right lateral omentum of 811 cm craniocaudal dimension and 1.2 cm thick also consistent with an abscess with multiple wall thickening and segments of the small bowel in the left abdomen and a postsurgical drain in the rectouterine pouch with no associated collection or abscess also small pleural effusion was seen with some consolidation right middle and lower lobes  #1 Uterine rupture peritonitis and now formation of abscesses:  Would continue her on cefazolin  and metronidazole .  Greatly appreciate interventional radiology being agreeable to drain abscesses and send for culture.  I expect to she is going to need to have drains in place for some time and beyond protracted antibiotics which will hopefully be able to be in the form of oral antibiotics with follow-up in our clinic.  #2 Pleural effusion and pneumonia:   She should be well covered with ceftriaxone and metronidazole   I have personally spent 50 minutes involved in face-to-face and non-face-to-face activities for this patient on the day of the visit. Professional time spent includes the following activities: Preparing to see the patient (review of tests), Obtaining and/or reviewing separately obtained history (admission/discharge record), Performing a medically appropriate examination and/or evaluation , Ordering medications/tests/procedures, referring and communicating with other health care professionals, Documenting clinical information in the EMR, Independently interpreting results (not separately reported), Communicating results to the patient/family/caregiver, Counseling and educating the patient/family/caregiver and Care coordination (not separately reported).   Evaluation of the patient requires complex antimicrobial therapy evaluation, counseling , isolation needs to reduce disease transmission and risk assessment and mitigation.   I will followup data on cultures and adjust antibiotics over the next 2 days.  LOS: 6 days    Amanda Paul 12/10/2023, 12:25 PM

## 2023-12-10 NOTE — Progress Notes (Signed)
 Physical Therapy Treatment Patient Details Name: Amanda Paul MRN: [id number] DOB: 1975/12/24 Today's Date: 12/10/2023   History of Present Illness Pt is 48 year old presented to [facility] on  12/04/23 for acute abd pain. Pt transferred to [facility] with uterine rupture of necrotic fibroid with chemical peritonitis. Pt underwent emergent total abdominal hysterectomy and bil salpingo-oopherectomy. PMH - uterine fibroids, htn, obesity,    PT Comments  Pt is making good progress towards goals. Pt is guarded with movement due to pain but is able to perform bed mobility with log roll, sit to stand, transfers and short in-home distance gait with RW at Mod I. Due to pt current functional status, home set up and available assistance at home recommending skilled physical therapy services 3x/week in order to address strength, balance and functional mobility to decrease risk for falls, injury and re-hospitalization.       If plan is discharge home, recommend the following: Assist for transportation;Assistance with cooking/housework;Help with stairs or ramp for entrance     Equipment Recommendations  Rollator (4 wheels);BSC/3in1       Precautions / Restrictions Precautions Precautions: Fall;Other (comment) Recall of Precautions/Restrictions: Intact Precaution/Restrictions Comments: abd protection precs, watch O2, JP drain Required Braces or Orthoses: Other Brace Other Brace: abd binder for comfort Restrictions Weight Bearing Restrictions Per Provider Order: No     Mobility  Bed Mobility Overal bed mobility: Needs Assistance Bed Mobility: Sit to Sidelying, Rolling, Sidelying to Sit Rolling: Contact guard assist, Supervision Sidelying to sit: Supervision     Sit to sidelying: Contact guard assist General bed mobility comments: Pt educated on log roll technique for sitting to supine with great re-call for reversing to get to sitting EOB.    Transfers Overall transfer level: Modified  independent Equipment used: Rolling walker (2 wheels) Transfers: Sit to/from Stand, Bed to chair/wheelchair/BSC Sit to Stand: Modified independent (Device/Increase time)   Step pivot transfers: Modified independent (Device/Increase time)       General transfer comment: No assist needed to stand today to RW. Good hand placement; very minimal increase in time due to pain.    Ambulation/Gait Ambulation/Gait assistance: Modified independent (Device/Increase time) Gait Distance (Feet): 20 Feet Assistive device: Rolling walker (2 wheels) Gait Pattern/deviations: Step-through pattern, Decreased stride length Gait velocity: decr Gait velocity interpretation: 1.31 - 2.62 ft/sec, indicative of limited community ambulator   General Gait Details: Pt ambulates slowly with use of RW; no LOB   Stairs Stairs:  (pt demonstrates sufficient strength with sit to stand and gait to perform stairs per home set up with Min A)             Balance Overall balance assessment: Mild deficits observed, not formally tested Sitting-balance support: Feet supported, No upper extremity supported Sitting balance-Leahy Scale: Good     Standing balance support: No upper extremity supported, Bilateral upper extremity supported, During functional activity Standing balance-Leahy Scale: Fair Standing balance comment: Pt was able to stand at sink and toilet without UE support performing self care activities without LOB      Communication Communication Communication: No apparent difficulties  Cognition Arousal: Alert Behavior During Therapy: WFL for tasks assessed/performed   PT - Cognitive impairments: No apparent impairments   Following commands: Intact      Cueing Cueing Techniques: Verbal cues, Gestural cues     General Comments General comments (skin integrity, edema, etc.): HR around 115-120 bpm during activity      Pertinent Vitals/Pain Pain Assessment Pain Assessment: Faces Faces Pain  Scale: Hurts little more Pain Location: abdomen with movement Pain Descriptors / Indicators: Grimacing, Guarding Pain Intervention(s): Limited activity within patient's tolerance, Monitored during session     PT Goals (current goals can now be found in the care plan section) Acute Rehab PT Goals Patient Stated Goal: To return to prior level of function PT Goal Formulation: With patient Time For Goal Achievement: 12/19/23 Potential to Achieve Goals: Good Progress towards PT goals: Progressing toward goals    Frequency    Min 2X/week      PT Plan  Updated discharge recommendations        AM-PAC PT 6 Clicks Mobility   Outcome Measure  Help needed turning from your back to your side while in a flat bed without using bedrails?: None Help needed moving from lying on your back to sitting on the side of a flat bed without using bedrails?: None Help needed moving to and from a bed to a chair (including a wheelchair)?: None Help needed standing up from a chair using your arms (e.g., wheelchair or bedside chair)?: None Help needed to walk in hospital room?: None Help needed climbing 3-5 steps with a railing? : A Little 6 Click Score: 23    End of Session   Activity Tolerance: Patient tolerated treatment well Patient left: with call bell/phone within reach;in bed Nurse Communication: Mobility status PT Visit Diagnosis: Unsteadiness on feet (R26.81);Other abnormalities of gait and mobility (R26.89);Muscle weakness (generalized) (M62.81);Pain Pain - part of body:  (abdomen)     Time: 8573-8540 PT Time Calculation (min) (ACUTE ONLY): 33 min  Charges:    $Therapeutic Activity: 23-37 mins PT General Charges $$ ACUTE PT VISIT: 1 Visit                    Amanda Paul, DPT, CLT  Acute Rehabilitation Services Office: [phone] (Secure chat preferred)    Amanda VEAR Paul 12/10/2023, 3:24 PM

## 2023-12-11 ENCOUNTER — Inpatient Hospital Stay (HOSPITAL_COMMUNITY)

## 2023-12-11 DIAGNOSIS — D259 Leiomyoma of uterus, unspecified: Secondary | ICD-10-CM

## 2023-12-11 DIAGNOSIS — R188 Other ascites: Secondary | ICD-10-CM

## 2023-12-11 DIAGNOSIS — K651 Peritoneal abscess: Secondary | ICD-10-CM

## 2023-12-11 DIAGNOSIS — J9601 Acute respiratory failure with hypoxia: Secondary | ICD-10-CM | POA: Diagnosis not present

## 2023-12-11 DIAGNOSIS — J189 Pneumonia, unspecified organism: Secondary | ICD-10-CM | POA: Diagnosis not present

## 2023-12-11 LAB — COMPREHENSIVE METABOLIC PANEL WITH GFR
ALT: 12 U/L (ref 0–44)
AST: 16 U/L (ref 15–41)
Albumin: 2.1 g/dL — ABNORMAL LOW (ref 3.5–5.0)
Alkaline Phosphatase: 53 U/L (ref 38–126)
Anion gap: 10 (ref 5–15)
BUN: 10 mg/dL (ref 6–20)
CO2: 34 mmol/L — ABNORMAL HIGH (ref 22–32)
Calcium: 8.5 mg/dL — ABNORMAL LOW (ref 8.9–10.3)
Chloride: 91 mmol/L — ABNORMAL LOW (ref 98–111)
Creatinine, Ser: 0.66 mg/dL (ref 0.44–1.00)
GFR, Estimated: >60 mL/min (ref >60)
Glucose, Bld: 119 mg/dL — ABNORMAL HIGH (ref 70–99)
Potassium: 3.7 mmol/L (ref 3.5–5.1)
Sodium: 135 mmol/L (ref 135–145)
Total Bilirubin: 0.4 mg/dL (ref 0.0–1.2)
Total Protein: 6.8 g/dL (ref 6.5–8.1)

## 2023-12-11 LAB — CBC
HCT: 29.4 % — ABNORMAL LOW (ref 36.0–46.0)
Hemoglobin: 9.1 g/dL — ABNORMAL LOW (ref 12.0–15.0)
MCH: 25.1 pg — ABNORMAL LOW (ref 26.0–34.0)
MCHC: 31 g/dL (ref 30.0–36.0)
MCV: 81 fL (ref 80.0–100.0)
Platelets: 428 K/uL — ABNORMAL HIGH (ref 150–400)
RBC: 3.63 MIL/uL — ABNORMAL LOW (ref 3.87–5.11)
RDW: 17.2 % — ABNORMAL HIGH (ref 11.5–15.5)
WBC: 19.6 K/uL — ABNORMAL HIGH (ref 4.0–10.5)
nRBC: 0.2 % (ref 0.0–0.2)

## 2023-12-11 LAB — GLUCOSE, CAPILLARY
Glucose-Capillary: 102 mg/dL — ABNORMAL HIGH (ref 70–99)
Glucose-Capillary: 98 mg/dL (ref 70–99)
Glucose-Capillary: 105 mg/dL — ABNORMAL HIGH (ref 70–99)

## 2023-12-11 MED ORDER — LIDOCAINE 1 % OPTIME INJ - NO CHARGE
10.0000 mL | Freq: Once | INTRAMUSCULAR | Status: DC
  Filled 2023-12-11: qty 10

## 2023-12-11 MED ORDER — MIDAZOLAM HCL 2 MG/2ML IJ SOLN
INTRAMUSCULAR | Status: AC
  Filled 2023-12-11: qty 4

## 2023-12-11 MED ORDER — FENTANYL CITRATE (PF) 100 MCG/2ML IJ SOLN
INTRAMUSCULAR | Status: AC | PRN
  Administered 2023-12-11 (×2): 50 ug via INTRAVENOUS

## 2023-12-11 MED ORDER — MIDAZOLAM HCL 2 MG/2ML IJ SOLN
INTRAMUSCULAR | Status: AC | PRN
  Administered 2023-12-11: 1 mg via INTRAVENOUS
  Administered 2023-12-11: .5 mg via INTRAVENOUS

## 2023-12-11 MED ORDER — FENTANYL CITRATE (PF) 100 MCG/2ML IJ SOLN
INTRAMUSCULAR | Status: AC
Start: 2023-12-11 — End: 2023-12-11
  Filled 2023-12-11: qty 4

## 2023-12-11 MED ORDER — SODIUM CHLORIDE 0.9% FLUSH
5.0000 mL | Freq: Three times a day (TID) | INTRAVENOUS | Status: DC
  Administered 2023-12-11 – 2023-12-17 (×16): 5 mL

## 2023-12-11 NOTE — Progress Notes (Signed)
 Gynecology Progress Note  Admission Date: 12/04/2023 Current Date: 12/11/2023 10:38 PM  Amanda Paul is a 48 y.o. G0P0000 POD5 s/p TAH/BSO for necrotic fibroid and uterine rupture remote from [place] in 2022.   History complicated by: Patient Active Problem List   Diagnosis Date Noted   Other ascites 12/10/2023   Abscess of abdominal cavity (HCC) 12/10/2023   Uterine leiomyoma 12/10/2023   DM2 (diabetes mellitus, type 2) (HCC) 12/06/2023   Sepsis (HCC) 12/06/2023   Acute hypoxic respiratory failure (HCC) 12/06/2023   CHF with unknown LVEF (HCC) 12/06/2023   Pneumonia 12/06/2023   Fibroid 12/04/2023   Persistent cough 11/20/2023   Peripheral edema 11/20/2023   Chronic pain of both ankles 10/01/2023   Allergic rhinitis with postnasal drip 10/01/2023   Class 3 severe obesity due to excess calories with serious comorbidity and body mass index (BMI) of 45.0 to 49.9 in adult 03/21/2023   Pure hypercholesterolemia 03/21/2023   Coronary artery calcification 03/21/2023   Enlarged liver 03/21/2023   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  Subjective:  She feels well this evening. She has no complaints. She denies N/V. Denies any pain. She is ambulatory to chair and bathroom. + flatus.  New drain hurts a bit  Objective:    Current Vital Signs 24h Vital Sign Ranges  T 99.3 F (37.4 C) Temp  Avg: 99 F (37.2 C)  Min: 98.4 F (36.9 C)  Max: 99.4 F (37.4 C)  BP 113/80 BP  Min: 109/67  Max: 143/88  HR 99 Pulse  Avg: 86.8  Min: 79  Max: 99  RR (!) 42 Resp  Avg: 24.7  Min: 16  Max: 42  SaO2 90 % Room Air SpO2  Avg: 95.2 %  Min: 90 %  Max: 99 %        24 Hour I/O Current Shift I/O  Time Ins Outs 08/30 0701 - 08/31 0700 In: -  Out: 1100 [Urine:1100] No intake/output data recorded.    New drain put out 326 cc thus far Patient Vitals for the past 24 hrs:  BP Temp Temp src Pulse Resp SpO2 Weight  12/11/23 2005 113/80 99.3 F (37.4 C) Oral 99 (!) 42 90 % --  12/11/23 1755 118/62 98.4 F (36.9 C) Oral 99 20 95 % --  12/11/23 1533 124/81 98.9 F (37.2 C) Oral -- (!) 30 -- --  12/11/23 1150 109/67 99.1 F (37.3 C) Oral 87 (!) 27 91 % --  12/11/23 1005 112/73 -- -- 79 (!) 29 96 % --  12/11/23 1000 122/70 -- -- 82 (!) 24 96 % --  12/11/23 0955 (!) 143/88 -- -- 81 20 95 % --  12/11/23 0950 (!) 131/90 -- -- 84 (!) 25 94 % --  12/11/23 0945 (!) 133/99 -- -- 84 (!) 25 96 % --  12/11/23 0940 113/76 -- -- 85 (!) 26 98 % --  12/11/23 0935 (!) 126/112 -- -- 85 (!) 21 97 % --  12/11/23 0930 (!) 112/97 -- -- 87 20 95 % --  12/11/23 0811 124/74 99.4 F (37.4 C) Oral 82 (!) 22 99 % --  12/11/23 0655 -- -- -- -- 16 -- 114.3  kg  12/11/23 0042 120/78 99 F (37.2 C) Oral 95 (!) 23 -- --    Physical exam: General appearance: alert, cooperative, and appears stated age Abdomen: soft, nttp, non-distended, c/d/I incision with honeycomb in place but serosanguinous fluid at the base. JP with 5-10 cc of serous fluid. BS quiet but present.  GU: No gross VB Lungs: Normal respiratory effort, lower lung bases with faint crackles Heart: S1, S2 normal, no murmur, rub or gallop, regular rate and rhythm Extremities: LE edema stable, non-pitting, SCDs on Skin: warm/dry Psych: appropriate Neurologic: Grossly normal  Labs  Recent Labs  Lab 12/09/23 0342 12/10/23 0346 12/11/23 0333  WBC 18.6* 20.3* 19.6*  HGB 9.5* 9.2* 9.1*  HCT 30.4* 29.6* 29.4*  PLT 399 415* 428*    Recent Labs  Lab 12/09/23 0342 12/10/23 0346 12/11/23 0333  NA 139 137 135  K 3.8 3.5 3.7  CL 98 94* 91*  CO2 29 31 34*  BUN 8 8 10   CREATININE 0.45 0.61 0.66  CALCIUM  8.9 8.7*  8.5*  PROT 6.2* 6.4* 6.8  BILITOT 0.6 0.5 0.4  ALKPHOS 50 52 53  ALT 14 17 12   AST 16 19 16   GLUCOSE 102* 110* 119*   CT GUIDED PERITONEAL/RETROPERITONEAL FLUID DRAIN BY PERC CATH Result Date: 12/11/2023 INDICATION: [id number] Intraabdominal fluid collection [id number] EXAM: CT-GUIDED RIGHT SUBDIAPHRAGMATIC FLUID COLLECTION DRAINAGE CATHETER PLACEMENT COMPARISON:  CT AP, 12/09/2023 MEDICATIONS: The patient is currently admitted to the hospital and receiving intravenous antibiotics. The antibiotics were administered within an appropriate time frame prior to the initiation of the procedure. ANESTHESIA/SEDATION: Moderate (conscious) sedation was employed during this procedure. A total of Versed  2 mg and Fentanyl  100 mcg was administered intravenously. Moderate Sedation Time: 15 minutes. The patient's level of consciousness and vital signs were monitored continuously by radiology nursing throughout the procedure under my direct supervision. CONTRAST:  None FLUOROSCOPY: CT dose in mGy was not provided. COMPLICATIONS: None immediate. PROCEDURE: RADIATION DOSE REDUCTION: This exam was performed according to the departmental dose-optimization program which includes automated exposure control, adjustment of the mA and/or kV according to patient size and/or use of iterative reconstruction technique. Informed written consent was obtained from the patient after a discussion of the risks, benefits and alternatives to treatment. The patient was placed supine on the CT gantry and a pre procedural CT was performed re-demonstrating the known abscess/fluid collection within the RIGHT subdiaphragmatic abdomen. The procedure was planned. A timeout was performed prior to the initiation of the procedure. The RIGHT axilla was prepped and draped in the usual sterile fashion. The overlying soft tissues were anesthetized with 1% lidocaine  with epinephrine. Appropriate trajectory was planned with the use of a 22 gauge spinal needle. An 18  gauge trocar needle was advanced into the abscess/fluid collection and a short Amplatz super stiff wire was coiled within the collection. Appropriate positioning was confirmed with a limited CT scan. The tract was serially dilated allowing placement of a 12 Fr drainage catheter. Appropriate positioning was confirmed with a limited postprocedural CT scan. 5 mL of thin serous peritoneal fluid was aspirated. The tube was connected to a bulb suction and sutured in place. A dressing was placed. The patient tolerated the procedure well without immediate post procedural complication. IMPRESSION: Successful CT-guided placement of a 12 Fr drainage catheter into the RIGHT subdiaphragmatic abdomen Diagnostic aspiration of 5 mL of thin serous peritoneal fluid. Samples were sent to the laboratory as requested by the ordering clinical team. RECOMMENDATIONS: The patient will return to  Vascular Interventional Radiology (VIR) for routine drainage catheter evaluation in 5-7 days. Thom Hall, MD [facility] Electronically Signed   By: Thom Hall M.D.   On: 12/11/2023 11:17     Assessment & Plan:  Patient status improving.  CT showed 12 cm fluid collection between liver and right hemidiaphragm-->IR consulted and drain placed with 326 cc output thus far That could be the answer to the lingering fever and ^WBC counts   *GYN:  - Voiding spontaneously.  - JP drain continuing with output of sanguinous fluid, minimal, 30 cc per day approximately - Path benign  *Cardiac:  - on tele - Lasix  and aldactone .  - Echo with diastolic dysfunction but normal EF. Currently heplocked.    *ID: - s/p Vanc. Still on ancef , await cultures from RUQ drsain [;aced tpday - WBC steady at 18.6.     *Heme: - Hgb stable at this time. - Will eventually start PO iron  every other day as an outpt.   *Pulm:  - Encouraged I/S - O2 is 2L currently and O2 sats good on this.   -  Vanc d/c'd today. Continue Cefepime , but will discuss with ID about discontinuing.  - Cultures have had no growth.   *Pain:  - Well controlled - using oxycodone  sparingly  *FEN/GI:  - Diet as tolerated.  - Encouraged continued high protein diet. Continue Ensure.   - Chewing gum. PPI.  - Hypokalemia - resolved s/p repletion.   *PPx:  - pLovenox. Continue SCDs.  - Encourage ambulation - she is working with PT/OT. OT expressed concern that she made need SNF. Case management consulted.   *Dispo: pending other medical goals. She is now meeting all surgical goals.   Code Status: Full Code  Vonn VEAR Inch, MD 12/11/2023 10:41 PM    Patient ID: [name], female   DOB: [birth year], 48 y.o.   MRN: [id number]

## 2023-12-11 NOTE — Procedures (Signed)
 Vascular and Interventional Radiology Procedure Note  Patient: Amanda Paul DOB: April 18, 1975 Medical Record Number: [id number] Note Date/Time: 12/11/23 8:45 AM   Performing Physician: Thom Hall, MD Assistant(s): None  Diagnosis: R subdiaphragmatic fluid collection  Procedure: DRAINAGE CATHETER PLACEMENT into a RIGHT SUBDIAPHRAGMATIC FLUID COLLECTION  Anesthesia: Conscious Sedation Complications: None Estimated Blood Loss: Minimal Specimens: Sent for Gram Stain, Aerobe Culture, and Anerobe Culture  Findings:  Successful CT-guided placement of 12 F catheter into a R subdiaphragmatic fluid collection.  Plan:  - Flush drain with 5 mL Normal Saline every 8 hours. - Follow up drain evaluation / sinogram in 7 day(s).  See detailed procedure note with images in PACS. The patient tolerated the procedure well without incident or complication and was returned to Recovery in stable condition.    Thom Hall, MD [facility] Rad[facility]   Pager. [phone] Clinic. [phone]

## 2023-12-11 NOTE — Progress Notes (Signed)
 PROGRESS NOTE    Amanda Paul  FMW:[id number] DOB: 1975/05/17 DOA: 12/04/2023 PCP: Jarold Medici, MD  48/F with obesity, hypertension, uterine fibroids was admitted to [facility] service 8/24 for hysterectomy for necrotic fibroid> chemical peritonitis, postop developed hypoxia, workup noted below for pleural effusions. - TRH consulting, started on diuretics - Also noted to be febrile, concern for sepsis postop, currently on broad-spectrum antibiotics - Persistent leukocytosis, CT 8/29 with multiple intra-abdominal abscesses  - IR consult> plan for r side drain  Subjective: - Feels fair overall, minimal abdominal symptoms, denies dyspnea  Assessment and Plan:  Acute hypoxic respiratory failure Acute diastolic CHF - Primarily secondary to volume overload, fever, sepsis also contributing -2D echo with preserved EF, grade 2 diastolic dysfunction, mildly reduced RV - Improved with diuresis, 12 L negative, she is euvolemic now, continue torsemide  and Aldactone  - increase activity, out of bed to chair  Sepsis, intra-abdominal abscesses Necrotic uterine fibroid, uterine rupture, chemical peritonitis -ID following, now on IV Ancef , flagyl  - Repeat CT  with large intra-abdominal abscesses - IR following, plan for right percutaneous drainage tube placement  Severe hypoalbuminemia - Albumin  is 1.7, likely worsened in the setting of above infection/sepsis, fatty liver noted on abdominal ultrasound in 12/24 - Definitely contributing to third spacing  Type 2 diabetes mellitus A1c is 6.9, CBGs are stable  DVT prophylaxis: Lovenox  Code Status: Full code Family Communication: None present Disposition Plan: Home pending improvement    Objective: Vitals:   12/11/23 0950 12/11/23 0955 12/11/23 1000 12/11/23 1005  BP: (!) 131/90 (!) 143/88 122/70 112/73  Pulse: 84 81 82 79  Resp: (!) 25 20 (!) 24 (!) 29  Temp:      TempSrc:      SpO2: 94% 95% 96% 96%  Weight:         Intake/Output Summary (Last 24 hours) at 12/11/2023 1021 Last data filed at 12/11/2023 0955 Gross per 24 hour  Intake --  Output 1111 ml  Net -1111 ml   Filed Weights   12/09/23 0805 12/11/23 0655  Weight: 133.4 kg 114.3 kg    Examination:  General exam: Chronically ill-appearing, AAO x 3 Respiratory system: Rare basilar rales otherwise clear Cardiovascular system: S1 & S2 heard, RRR.  Abd: nondistended, soft and nontender.Normal bowel sounds heard.,  JP drain Central nervous system: Alert and oriented. No focal neurological deficits. Extremities: no edema Skin: No rashes Psychiatry:  Mood & affect appropriate.     Data Reviewed:   CBC: Recent Labs  Lab 12/07/23 0306 12/08/23 0358 12/09/23 0342 12/10/23 0346 12/11/23 0333  WBC 19.7* 18.2* 18.6* 20.3* 19.6*  HGB 9.2* 9.6* 9.5* 9.2* 9.1*  HCT 30.0* 30.3* 30.4* 29.6* 29.4*  MCV 83.1 81.7 80.9 80.9 81.0  PLT 368 405* 399 415* 428*   Basic Metabolic Panel: Recent Labs  Lab 12/05/23 1838 12/06/23 0416 12/07/23 0306 12/08/23 0358 12/09/23 0342 12/10/23 0346 12/11/23 0333  NA  --    < > 137 138 139 137 135  K  --    < > 3.4* 3.2* 3.8 3.5 3.7  CL  --    < > 99 96* 98 94* 91*  CO2  --    < > 29 30 29 31  34*  GLUCOSE  --    < > 106* 100* 102* 110* 119*  BUN  --    < > 10 7 8 8 10   CREATININE  --    < > 0.73 0.57 0.45 0.61 0.66  CALCIUM   --    < >  8.4* 9.0 8.9 8.7* 8.5*  MG 2.0  --   --   --   --   --   --    < > = values in this interval not displayed.   GFR: Estimated Creatinine Clearance: 106.1 mL/min (by C-G formula based on SCr of 0.66 mg/dL). Liver Function Tests: Recent Labs  Lab 12/07/23 0306 12/08/23 0358 12/09/23 0342 12/10/23 0346 12/11/23 0333  AST 14* 12* 16 19 16   ALT 12 12 14 17 12   ALKPHOS 57 52 50 52 53  BILITOT 0.4 0.7 0.6 0.5 0.4  PROT 5.8* 6.6 6.2* 6.4* 6.8  ALBUMIN  1.7* 2.4* 2.0* 2.0* 2.1*   No results for input(s): LIPASE, AMYLASE in the last 168 hours.  No results for  input(s): AMMONIA in the last 168 hours. Coagulation Profile: Recent Labs  Lab 12/04/23 1454 12/10/23 1257  INR 1.2 1.2   Cardiac Enzymes: No results for input(s): CKTOTAL, CKMB, CKMBINDEX, TROPONINI in the last 168 hours. BNP (last 3 results) No results for input(s): PROBNP in the last 8760 hours. HbA1C: No results for input(s): HGBA1C in the last 72 hours.  CBG: Recent Labs  Lab 12/10/23 0626 12/10/23 1209 12/10/23 1727 12/10/23 2145 12/11/23 0650  GLUCAP 109* 114* 111* 115* 102*   Lipid Profile: No results for input(s): CHOL, HDL, LDLCALC, TRIG, CHOLHDL, LDLDIRECT in the last 72 hours. Thyroid Function Tests: No results for input(s): TSH, T4TOTAL, FREET4, T3FREE, THYROIDAB in the last 72 hours.  Anemia Panel: No results for input(s): VITAMINB12, FOLATE, FERRITIN, TIBC, IRON , RETICCTPCT in the last 72 hours. Urine analysis:    Component Value Date/Time   COLORURINE YELLOW 12/05/2023 1720   APPEARANCEUR CLEAR 12/05/2023 1720   LABSPEC >1.046 (H) 12/05/2023 1720   PHURINE 5.0 12/05/2023 1720   GLUCOSEU NEGATIVE 12/05/2023 1720   HGBUR MODERATE (A) 12/05/2023 1720   BILIRUBINUR NEGATIVE 12/05/2023 1720   BILIRUBINUR negative 09/20/2023 1638   BILIRUBINUR negative 09/14/2021 0839   KETONESUR NEGATIVE 12/05/2023 1720   PROTEINUR 30 (A) 12/05/2023 1720   UROBILINOGEN 0.2 09/20/2023 1638   NITRITE NEGATIVE 12/05/2023 1720   LEUKOCYTESUR NEGATIVE 12/05/2023 1720   Sepsis Labs: @LABRCNTIP (procalcitonin:4,lacticidven:4)  ) Recent Results (from the past 240 hours)  Culture, blood (routine x 2)     Status: None   Collection Time: 12/04/23 11:47 AM   Specimen: BLOOD  Result Value Ref Range Status   Specimen Description   Final    BLOOD LEFT ANTECUBITAL Performed at [facility], [address] [place], [place] [address]    Special Requests   Final    BOTTLES DRAWN AEROBIC AND ANAEROBIC Blood  Culture adequate volume Performed at [facility], [address], [place], [place] [address]    Culture   Final    NO GROWTH 5 DAYS Performed at [facility], [address], [place], [place] [address]    Report Status 12/09/2023 FINAL  Final  Aerobic/Anaerobic Culture w Gram Stain (surgical/deep wound)     Status: None   Collection Time: 12/04/23  6:14 PM   Specimen: Wound; Body Fluid  Result Value Ref Range Status   Specimen Description WOUND  Final   Special Requests NONE  Final   Gram Stain   Final    RARE WBC PRESENT, PREDOMINANTLY PMN NO ORGANISMS SEEN    Culture   Final    FEW GROUP B STREP(S.AGALACTIAE)ISOLATED TESTING AGAINST S. AGALACTIAE NOT ROUTINELY PERFORMED DUE TO PREDICTABILITY OF AMP/PEN/VAN SUSCEPTIBILITY. NO ANAEROBES  ISOLATED Performed at [facility], [address] [place], [place] [address]    Report Status 12/09/2023 FINAL  Final     Radiology Studies: CT ABDOMEN PELVIS W CONTRAST Result Date: 12/09/2023 CLINICAL DATA:  Intra-abdominal infection/peritonitis (Ped 0-17y) EXAM: CT ABDOMEN AND PELVIS WITH CONTRAST TECHNIQUE: Multidetector CT imaging of the abdomen and pelvis was performed using the standard protocol following bolus administration of intravenous contrast. RADIATION DOSE REDUCTION: This exam was performed according to the departmental dose-optimization program which includes automated exposure control, adjustment of the mA and/or kV according to patient size and/or use of iterative reconstruction technique. CONTRAST:  75mL OMNIPAQUE  IOHEXOL  350 MG/ML SOLN COMPARISON:  December 04, 2023, November 24, 2023 FINDINGS: Lower chest: Elevation of the right hemidiaphragm. Small right pleural effusion with dense consolidation in the right middle and right lower lobes containing air bronchograms. Subsegmental atelectasis in the lingula and left lower lobe. Hepatobiliary: No mass.No radiopaque stones or wall thickening of the  gallbladder. No intrahepatic or extrahepatic biliary ductal dilation. The portal veins are patent. Pancreas: No mass or main ductal dilation. No peripancreatic inflammation or fluid collection. Spleen: Normal size. No mass. Adrenals/Urinary Tract: No adrenal masses. No renal mass. No nephrolithiasis or hydronephrosis. No bladder wall thickening. Small amount of nondependent gas in the bladder lumen, likely due to recent catheterization. Stomach/Bowel: The stomach is decompressed without focal abnormality. No small bowel obstruction. Fluid-filled dilation of the small bowel and proximal colon, possibly due to an adynamic ileus.Mild wall thickening of multiple segments in the left abdomen.Normal appendix. Descending and sigmoid colonic diverticulosis. No changes of acute diverticulitis. Vascular/Lymphatic: No aortic aneurysm. No intraabdominal or pelvic lymphadenopathy. Reproductive: Interval hysterectomy.Postsurgical drain in the pelvis. No free pelvic fluid. Other: No pneumoperitoneum. There is a 12.2 x 13.6 x 4.5 cm (APxTRxCC) subdiaphragmatic fluid collection exerting significant mass effect on the right hepatic dome. A couple of small soft tissue nodules are noted along the superior aspect of this fluid collection (coronal 64-66), measuring up to 8 mm. Curvilinear fluid collection along the anterior right omentum, measuring 8.1 cm in craniocaudal dimension (axial 49-57, coronal 86). Musculoskeletal: No acute fracture or destructive lesion. Midline ventral pelvic incision without associated fluid collection. There is a fat containing supraumbilical ventral hernia containing intra-abdominal fat, which appears inflamed, likely postsurgical change. Multilevel degenerative disc disease of the spine. IMPRESSION: 1. Right subdiaphragmatic fluid collection exerting significant mass effect on the right hepatic dome, measuring 12.2 x 13.6 x 4.5 cm (APxTRxCC), worrisome for abscess. A couple of small soft tissue nodules are  noted along the superior aspect of this fluid collection (coronal 64-66), measuring up to 8 mm, possibly reflecting infectious debris or blood products. Correlation with fluid sampling recommended to exclude malignant cells. 2. Curvilinear fluid collection along the anterior right lateral omentum, measuring 8.1 cm in craniocaudal dimension (axial 49-57, coronal 86), 1.2 cm thick, probably a developing abscess. 3. Mild wall thickening of multiple segments of small bowel in the left abdomen, likely reflecting reactive changes. Fluid-filled dilation of the small bowel and proximal colon, possibly due to an adynamic ileus. 4. Postsurgical drain in the rectouterine pouch. No associated fluid collection or abscess. 5. Small right pleural effusion with dense consolidation in the right middle and right lower lobes, most likely compressive atelectasis. Electronically Signed   By: Rogelia Myers M.D.   On: 12/09/2023 18:30     Scheduled Meds:  chewing gum (ORBIT) sugar free  1 Stick Oral QID   Chlorhexidine  Gluconate Cloth  6  each Topical Daily   enoxaparin  (LOVENOX ) injection  60 mg Subcutaneous Q24H   famotidine   20 mg Oral BID   feeding supplement  237 mL Oral BID BM   insulin  aspart  0-15 Units Subcutaneous TID WC   lactulose   20 g Oral BID   metroNIDAZOLE   500 mg Oral Q12H   pantoprazole   40 mg Oral Q1200   polyethylene glycol  17 g Oral QODAY   prenatal multivitamin  1 tablet Oral Q1200   simethicone   80 mg Oral TID   spironolactone   25 mg Oral Daily   torsemide   40 mg Oral Daily   Continuous Infusions:   ceFAZolin  (ANCEF ) IV 2 g (12/11/23 0600)     LOS: 7 days    Time spent:    Sigurd Pac, MD [facility]   12/11/2023, 10:21 AM

## 2023-12-12 ENCOUNTER — Inpatient Hospital Stay (HOSPITAL_COMMUNITY)

## 2023-12-12 DIAGNOSIS — J9601 Acute respiratory failure with hypoxia: Secondary | ICD-10-CM | POA: Diagnosis not present

## 2023-12-12 LAB — BASIC METABOLIC PANEL WITH GFR
Anion gap: 14 (ref 5–15)
BUN: 11 mg/dL (ref 6–20)
CO2: 33 mmol/L — ABNORMAL HIGH (ref 22–32)
Calcium: 8.9 mg/dL (ref 8.9–10.3)
Chloride: 90 mmol/L — ABNORMAL LOW (ref 98–111)
Creatinine, Ser: 0.77 mg/dL (ref 0.44–1.00)
GFR, Estimated: >60 mL/min (ref >60)
Glucose, Bld: 106 mg/dL — ABNORMAL HIGH (ref 70–99)
Potassium: 4.2 mmol/L (ref 3.5–5.1)
Sodium: 137 mmol/L (ref 135–145)

## 2023-12-12 LAB — CBC
HCT: 32.4 % — ABNORMAL LOW (ref 36.0–46.0)
Hemoglobin: 10 g/dL — ABNORMAL LOW (ref 12.0–15.0)
MCH: 25.1 pg — ABNORMAL LOW (ref 26.0–34.0)
MCHC: 30.9 g/dL (ref 30.0–36.0)
MCV: 81.4 fL (ref 80.0–100.0)
Platelets: 472 K/uL — ABNORMAL HIGH (ref 150–400)
RBC: 3.98 MIL/uL (ref 3.87–5.11)
RDW: 17.5 % — ABNORMAL HIGH (ref 11.5–15.5)
WBC: 18.3 K/uL — ABNORMAL HIGH (ref 4.0–10.5)
nRBC: 0.1 % (ref 0.0–0.2)

## 2023-12-12 NOTE — Progress Notes (Signed)
 PROGRESS NOTE    Amanda Paul  FMW:[id number] DOB: 1975-11-22 DOA: 12/04/2023 PCP: Jarold Medici, MD  48/F with obesity, hypertension, uterine fibroids was admitted to [facility] service 8/24 for hysterectomy for necrotic fibroid> chemical peritonitis, postop developed hypoxia, workup noted below for pleural effusions. - TRH consulting, started on diuretics - Also noted to be febrile, concern for sepsis postop, currently on broad-spectrum antibiotics - Persistent leukocytosis, CT 8/29 with multiple intra-abdominal abscesses  - IR consulted - 8/31, drain placed in IR into right subdiaphragmatic fluid collection  Subjective: - Has some abdominal discomfort at the site of the drain  Assessment and Plan:  Acute hypoxic respiratory failure Acute diastolic CHF - Primarily secondary to volume overload, fever, sepsis also contributing -2D echo with preserved EF, grade 2 diastolic dysfunction, mildly reduced RV - Improved with diuresis, 12 L negative, she is euvolemic now, continue torsemide  and Aldactone  - increase activity, out of bed to chair  Sepsis, intra-abdominal abscesses Necrotic uterine fibroid, uterine rupture, chemical peritonitis -ID following, now on IV Ancef , flagyl  - Repeat CT  with large intra-abdominal abscesses - IR following, sp percutaneous drain placed in right subdiaphragmatic fluid collection - Follow-up cultures, monitor WBC  Severe hypoalbuminemia - Albumin  is 1.7, likely worsened in the setting of above infection/sepsis, fatty liver noted on abdominal ultrasound in 12/24 - Definitely contributing to third spacing  Type 2 diabetes mellitus A1c is 6.9, CBGs are stable  DVT prophylaxis: Lovenox  Code Status: Full code Family Communication: None present Disposition Plan: Home pending improvement    Objective: Vitals:   12/12/23 0012 12/12/23 0451 12/12/23 0500 12/12/23 0817  BP: 128/74 124/80  126/82  Pulse: 90 87  82  Resp: (!) 25 (!) 24  20   Temp: 98.5 F (36.9 C) 98.7 F (37.1 C)  98.9 F (37.2 C)  TempSrc: Oral Oral  Oral  SpO2:    (!) 89%  Weight:   112.9 kg     Intake/Output Summary (Last 24 hours) at 12/12/2023 1054 Last data filed at 12/12/2023 1011 Gross per 24 hour  Intake 240 ml  Output 410 ml  Net -170 ml   Filed Weights   12/09/23 0805 12/11/23 0655 12/12/23 0500  Weight: 133.4 kg 114.3 kg 112.9 kg    Examination:  General exam: Chronically ill-appearing, AAO x 3 Respiratory system: Rare basilar rales otherwise clear Cardiovascular system: S1 & S2 heard, RRR.  Abd: nondistended, soft and nontender.Normal bowel sounds heard.,  JP drain, additional JP drain on the right now Central nervous system: Alert and oriented. No focal neurological deficits. Extremities: no edema Skin: No rashes Psychiatry:  Mood & affect appropriate.     Data Reviewed:   CBC: Recent Labs  Lab 12/08/23 0358 12/09/23 0342 12/10/23 0346 12/11/23 0333 12/12/23 0500  WBC 18.2* 18.6* 20.3* 19.6* 18.3*  HGB 9.6* 9.5* 9.2* 9.1* 10.0*  HCT 30.3* 30.4* 29.6* 29.4* 32.4*  MCV 81.7 80.9 80.9 81.0 81.4  PLT 405* 399 415* 428* 472*   Basic Metabolic Panel: Recent Labs  Lab 12/05/23 1838 12/06/23 0416 12/08/23 0358 12/09/23 0342 12/10/23 0346 12/11/23 0333 12/12/23 0500  NA  --    < > 138 139 137 135 137  K  --    < > 3.2* 3.8 3.5 3.7 4.2  CL  --    < > 96* 98 94* 91* 90*  CO2  --    < > 30 29 31  34* 33*  GLUCOSE  --    < > 100* 102*  110* 119* 106*  BUN  --    < > 7 8 8 10 11   CREATININE  --    < > 0.57 0.45 0.61 0.66 0.77  CALCIUM   --    < > 9.0 8.9 8.7* 8.5* 8.9  MG 2.0  --   --   --   --   --   --    < > = values in this interval not displayed.   GFR: Estimated Creatinine Clearance: 105.5 mL/min (by C-G formula based on SCr of 0.77 mg/dL). Liver Function Tests: Recent Labs  Lab 12/07/23 0306 12/08/23 0358 12/09/23 0342 12/10/23 0346 12/11/23 0333  AST 14* 12* 16 19 16   ALT 12 12 14 17 12   ALKPHOS 57 52  50 52 53  BILITOT 0.4 0.7 0.6 0.5 0.4  PROT 5.8* 6.6 6.2* 6.4* 6.8  ALBUMIN  1.7* 2.4* 2.0* 2.0* 2.1*   No results for input(s): LIPASE, AMYLASE in the last 168 hours.  No results for input(s): AMMONIA in the last 168 hours. Coagulation Profile: Recent Labs  Lab 12/10/23 1257  INR 1.2   Cardiac Enzymes: No results for input(s): CKTOTAL, CKMB, CKMBINDEX, TROPONINI in the last 168 hours. BNP (last 3 results) No results for input(s): PROBNP in the last 8760 hours. HbA1C: No results for input(s): HGBA1C in the last 72 hours.  CBG: Recent Labs  Lab 12/10/23 1727 12/10/23 2145 12/11/23 0650 12/11/23 1154 12/11/23 1533  GLUCAP 111* 115* 102* 98 105*   Lipid Profile: No results for input(s): CHOL, HDL, LDLCALC, TRIG, CHOLHDL, LDLDIRECT in the last 72 hours. Thyroid Function Tests: No results for input(s): TSH, T4TOTAL, FREET4, T3FREE, THYROIDAB in the last 72 hours.  Anemia Panel: No results for input(s): VITAMINB12, FOLATE, FERRITIN, TIBC, IRON , RETICCTPCT in the last 72 hours. Urine analysis:    Component Value Date/Time   COLORURINE YELLOW 12/05/2023 1720   APPEARANCEUR CLEAR 12/05/2023 1720   LABSPEC >1.046 (H) 12/05/2023 1720   PHURINE 5.0 12/05/2023 1720   GLUCOSEU NEGATIVE 12/05/2023 1720   HGBUR MODERATE (A) 12/05/2023 1720   BILIRUBINUR NEGATIVE 12/05/2023 1720   BILIRUBINUR negative 09/20/2023 1638   BILIRUBINUR negative 09/14/2021 0839   KETONESUR NEGATIVE 12/05/2023 1720   PROTEINUR 30 (A) 12/05/2023 1720   UROBILINOGEN 0.2 09/20/2023 1638   NITRITE NEGATIVE 12/05/2023 1720   LEUKOCYTESUR NEGATIVE 12/05/2023 1720   Sepsis Labs: @LABRCNTIP (procalcitonin:4,lacticidven:4)  ) Recent Results (from the past 240 hours)  Culture, blood (routine x 2)     Status: None   Collection Time: 12/04/23 11:47 AM   Specimen: BLOOD  Result Value Ref Range Status   Specimen Description   Final    BLOOD LEFT  ANTECUBITAL Performed at [facility], [address], [place], [place] [address]    Special Requests   Final    BOTTLES DRAWN AEROBIC AND ANAEROBIC Blood Culture adequate volume Performed at [facility], [address] [place], [place] [address]    Culture   Final    NO GROWTH 5 DAYS Performed at [facility], [address] [place], [place] [address]    Report Status 12/09/2023 FINAL  Final  Aerobic/Anaerobic Culture w Gram Stain (surgical/deep wound)     Status: None   Collection Time: 12/04/23  6:14 PM   Specimen: Wound; Body Fluid  Result Value Ref Range Status   Specimen Description WOUND  Final   Special Requests NONE  Final   Gram Stain   Final  RARE WBC PRESENT, PREDOMINANTLY PMN NO ORGANISMS SEEN    Culture   Final    FEW GROUP B STREP(S.AGALACTIAE)ISOLATED TESTING AGAINST S. AGALACTIAE NOT ROUTINELY PERFORMED DUE TO PREDICTABILITY OF AMP/PEN/VAN SUSCEPTIBILITY. NO ANAEROBES ISOLATED Performed at [facility], [address] [place], [place] [address]    Report Status 12/09/2023 FINAL  Final  Aerobic/Anaerobic Culture w Gram Stain (surgical/deep wound)     Status: None (Preliminary result)   Collection Time: 12/11/23  8:45 AM   Specimen: Abscess  Result Value Ref Range Status   Specimen Description ABSCESS  Final   Special Requests NONE  Final   Gram Stain NO WBC SEEN NO ORGANISMS SEEN   Final   Culture   Final    NO GROWTH < 24 HOURS Performed at [facility], [address], [place], [place] [address]    Report Status PENDING  Incomplete     Radiology Studies: DG Chest Port 1 View Result Date: 12/12/2023 CLINICAL DATA:  [id number] Post-operative state 252351 EXAM: PORTABLE CHEST 1 VIEW COMPARISON:  December 08, 2023 FINDINGS: The cardiomediastinal silhouette is unchanged in contour with elevation of the RIGHT hemidiaphragm.RIGHT-sided surgical drain placed in a perihepatic fluid collection. Similar  appearance of a RIGHT-sided pleural effusion. No pneumothorax. RIGHT basilar homogeneous opacity likely atelectasis. LEFT basilar heterogeneous opacity, nonspecific atherosclerotic calcifications. IMPRESSION: 1. Similar appearance of a RIGHT-sided pleural effusion with RIGHT basilar atelectasis. 2. LEFT basilar heterogeneous opacity, nonspecific and could reflect atelectasis versus infection. Electronically Signed   By: Corean Salter M.D.   On: 12/12/2023 10:35   CT GUIDED PERITONEAL/RETROPERITONEAL FLUID DRAIN BY PERC CATH Result Date: 12/11/2023 INDICATION: [id number] Intraabdominal fluid collection [id number] EXAM: CT-GUIDED RIGHT SUBDIAPHRAGMATIC FLUID COLLECTION DRAINAGE CATHETER PLACEMENT COMPARISON:  CT AP, 12/09/2023 MEDICATIONS: The patient is currently admitted to the hospital and receiving intravenous antibiotics. The antibiotics were administered within an appropriate time frame prior to the initiation of the procedure. ANESTHESIA/SEDATION: Moderate (conscious) sedation was employed during this procedure. A total of Versed  2 mg and Fentanyl  100 mcg was administered intravenously. Moderate Sedation Time: 15 minutes. The patient's level of consciousness and vital signs were monitored continuously by radiology nursing throughout the procedure under my direct supervision. CONTRAST:  None FLUOROSCOPY: CT dose in mGy was not provided. COMPLICATIONS: None immediate. PROCEDURE: RADIATION DOSE REDUCTION: This exam was performed according to the departmental dose-optimization program which includes automated exposure control, adjustment of the mA and/or kV according to patient size and/or use of iterative reconstruction technique. Informed written consent was obtained from the patient after a discussion of the risks, benefits and alternatives to treatment. The patient was placed supine on the CT gantry and a pre procedural CT was performed re-demonstrating the known abscess/fluid collection within the RIGHT  subdiaphragmatic abdomen. The procedure was planned. A timeout was performed prior to the initiation of the procedure. The RIGHT axilla was prepped and draped in the usual sterile fashion. The overlying soft tissues were anesthetized with 1% lidocaine  with epinephrine. Appropriate trajectory was planned with the use of a 22 gauge spinal needle. An 18 gauge trocar needle was advanced into the abscess/fluid collection and a short Amplatz super stiff wire was coiled within the collection. Appropriate positioning was confirmed with a limited CT scan. The tract was serially dilated allowing placement of a 12 Fr drainage catheter. Appropriate positioning was confirmed with a limited postprocedural CT scan. 5 mL of thin serous peritoneal fluid was aspirated. The tube was connected to a bulb suction and  sutured in place. A dressing was placed. The patient tolerated the procedure well without immediate post procedural complication. IMPRESSION: Successful CT-guided placement of a 12 Fr drainage catheter into the RIGHT subdiaphragmatic abdomen Diagnostic aspiration of 5 mL of thin serous peritoneal fluid. Samples were sent to the laboratory as requested by the ordering clinical team. RECOMMENDATIONS: The patient will return to Vascular Interventional Radiology (VIR) for routine drainage catheter evaluation in 5-7 days. Thom Hall, MD [facility] Electronically Signed   By: Thom Hall M.D.   On: 12/11/2023 11:17     Scheduled Meds:  chewing gum (ORBIT) sugar free  1 Stick Oral QID   Chlorhexidine  Gluconate Cloth  6 each Topical Daily   enoxaparin  (LOVENOX ) injection  60 mg Subcutaneous Q24H   famotidine   20 mg Oral BID   feeding supplement  237 mL Oral BID BM   lactulose   20 g Oral BID   lidocaine  1 %  10 mL Other Once   metroNIDAZOLE   500 mg Oral Q12H   pantoprazole   40 mg Oral Q1200   polyethylene glycol  17 g Oral QODAY   prenatal multivitamin  1  tablet Oral Q1200   simethicone   80 mg Oral TID   sodium chloride  flush  5 mL Intracatheter Q8H   spironolactone   25 mg Oral Daily   torsemide   40 mg Oral Daily   Continuous Infusions:   ceFAZolin  (ANCEF ) IV 2 g (12/12/23 0540)     LOS: 8 days    Time spent:    Amanda Pac, MD [facility]   12/12/2023, 10:54 AM

## 2023-12-12 NOTE — Progress Notes (Signed)
 PT Cancellation Note  Patient Details Name: Amanda Paul MRN: [id number] DOB: 07-03-1975   Cancelled Treatment:    Reason Eval/Treat Not Completed: (P) Fatigue/lethargy limiting ability to participate;Pain limiting ability to participate (Pt just got up to chair with NT assist, c/o R pain around drain site. Agreeable to working with therapies following date.)   [name] 12/12/2023, 5:46 PM

## 2023-12-13 ENCOUNTER — Inpatient Hospital Stay: Payer: Self-pay | Admitting: Internal Medicine

## 2023-12-13 DIAGNOSIS — Z880 Allergy status to penicillin: Secondary | ICD-10-CM

## 2023-12-13 DIAGNOSIS — J9601 Acute respiratory failure with hypoxia: Secondary | ICD-10-CM | POA: Diagnosis not present

## 2023-12-13 LAB — CBC
HCT: 30.9 % — ABNORMAL LOW (ref 36.0–46.0)
Hemoglobin: 9.5 g/dL — ABNORMAL LOW (ref 12.0–15.0)
MCH: 25 pg — ABNORMAL LOW (ref 26.0–34.0)
MCHC: 30.7 g/dL (ref 30.0–36.0)
MCV: 81.3 fL (ref 80.0–100.0)
Platelets: 522 K/uL — ABNORMAL HIGH (ref 150–400)
RBC: 3.8 MIL/uL — ABNORMAL LOW (ref 3.87–5.11)
RDW: 17.7 % — ABNORMAL HIGH (ref 11.5–15.5)
WBC: 17.7 K/uL — ABNORMAL HIGH (ref 4.0–10.5)
nRBC: 0.2 % (ref 0.0–0.2)

## 2023-12-13 LAB — BASIC METABOLIC PANEL WITH GFR
Anion gap: 13 (ref 5–15)
BUN: 12 mg/dL (ref 6–20)
CO2: 33 mmol/L — ABNORMAL HIGH (ref 22–32)
Calcium: 8.5 mg/dL — ABNORMAL LOW (ref 8.9–10.3)
Chloride: 87 mmol/L — ABNORMAL LOW (ref 98–111)
Creatinine, Ser: 0.7 mg/dL (ref 0.44–1.00)
GFR, Estimated: >60 mL/min (ref >60)
Glucose, Bld: 109 mg/dL — ABNORMAL HIGH (ref 70–99)
Potassium: 3.7 mmol/L (ref 3.5–5.1)
Sodium: 133 mmol/L — ABNORMAL LOW (ref 135–145)

## 2023-12-13 MED ORDER — ENOXAPARIN SODIUM 60 MG/0.6ML IJ SOSY
55.0000 mg | PREFILLED_SYRINGE | INTRAMUSCULAR | Status: DC
  Administered 2023-12-14 – 2023-12-17 (×4): 55 mg via SUBCUTANEOUS
  Filled 2023-12-13 (×4): qty 0.6

## 2023-12-13 NOTE — Progress Notes (Signed)
 Occupational Therapy Treatment Patient Details Name: Amanda Paul MRN: [id number] DOB: 1976-03-28 Today's Date: 12/13/2023   History of present illness Pt is 48 year old presented to [facility] on  12/04/23 for acute abd pain. Pt transferred to [facility] with uterine rupture of necrotic fibroid with chemical peritonitis. Pt underwent emergent total abdominal hysterectomy and bil salpingo-oopherectomy. PMH - uterine fibroids, htn, obesity,   OT comments  Pt at this time agreed to get OOB and work on bed mobility with 40 deg and did not use bed rail at this time. Pt then completed transfer to chair with CGA and IV pole. Pt work on IS in session. At this time recommendation for [facility] if she can have continued support with groceries and getting to MD visits.       If plan is discharge home, recommend the following:  A little help with walking and/or transfers;A little help with bathing/dressing/bathroom;Assist for transportation;Help with stairs or ramp for entrance   Equipment Recommendations  BSC/3in1;Tub/shower bench    Recommendations for Other Services      Precautions / Restrictions Precautions Precautions: Fall;Other (comment) Recall of Precautions/Restrictions: Intact Precaution/Restrictions Comments: abd protection precs, watch O2, JP drain Required Braces or Orthoses: Other Brace Other Brace: abd binder for comfort, decline to use and spoke to nursing about cutting a hole out if neeed Restrictions Weight Bearing Restrictions Per Provider Order: No       Mobility Bed Mobility Overal bed mobility: Modified Independent Bed Mobility: Supine to Sit Rolling: Modified independent (Device/Increase time)         General bed mobility comments: Pt's bed was placed at 40 deg and reported they got a new wedge that she is going to ahve a friend bring in to preactive with    Transfers Overall transfer level: Modified independent Equipment used: None (iv pole) Transfers: Sit to/from  Stand Sit to Stand: Contact guard assist                 Balance Overall balance assessment: Mild deficits observed, not formally tested Sitting-balance support: Feet supported Sitting balance-Leahy Scale: Good     Standing balance support: No upper extremity supported, Single extremity supported Standing balance-Leahy Scale: Fair                             ADL either performed or assessed with clinical judgement   ADL Overall ADL's : Needs assistance/impaired Eating/Feeding: Independent   Grooming: Set up;Sitting   Upper Body Bathing: Set up;Sitting       Upper Body Dressing : Set up;Sitting       Toilet Transfer: Contact guard assist           Functional mobility during ADLs: Contact guard assist      Extremity/Trunk Assessment Upper Extremity Assessment Upper Extremity Assessment: Overall WFL for tasks assessed   Lower Extremity Assessment Lower Extremity Assessment: Defer to PT evaluation        Vision       Perception     Praxis     Communication Communication Communication: No apparent difficulties   Cognition Arousal: Alert Behavior During Therapy: WFL for tasks assessed/performed Cognition: No apparent impairments                               Following commands: Intact        Cueing   Cueing Techniques: Verbal cues, Gestural  cues  Exercises      Shoulder Instructions       General Comments      Pertinent Vitals/ Pain       Pain Assessment Pain Assessment: Faces Faces Pain Scale: Hurts even more Pain Location: abdomen with movement and R JP drain Pain Descriptors / Indicators: Grimacing, Guarding Pain Intervention(s): Limited activity within patient's tolerance, Monitored during session, Repositioned  Home Living                                          Prior Functioning/Environment              Frequency  Min 2X/week        Progress Toward Goals  OT  Goals(current goals can now be found in the care plan section)  Progress towards OT goals: Progressing toward goals  Acute Rehab OT Goals Patient Stated Goal: to be able to be more indep OT Goal Formulation: With patient Time For Goal Achievement: 12/20/23 Potential to Achieve Goals: Good ADL Goals Pt Will Perform Upper Body Dressing: with modified independence Pt Will Perform Lower Body Dressing: with modified independence;with adaptive equipment;sit to/from stand Pt Will Transfer to Toilet: with modified independence Pt Will Perform Toileting - Clothing Manipulation and hygiene: with modified independence  Plan      Co-evaluation                 AM-PAC OT 6 Clicks Daily Activity     Outcome Measure   Help from another person eating meals?: None Help from another person taking care of personal grooming?: None Help from another person toileting, which includes using toliet, bedpan, or urinal?: A Little Help from another person bathing (including washing, rinsing, drying)?: A Little Help from another person to put on and taking off regular upper body clothing?: None Help from another person to put on and taking off regular lower body clothing?: A Little 6 Click Score: 21    End of Session Equipment Utilized During Treatment: Rolling walker (2 wheels)  OT Visit Diagnosis: Unsteadiness on feet (R26.81);Other abnormalities of gait and mobility (R26.89);Pain Pain - part of body:  (drain.abdominal)   Activity Tolerance Patient tolerated treatment well   Patient Left in chair;with call bell/phone within reach   Nurse Communication Mobility status        Time: 9149-9064 OT Time Calculation (min): 45 min  Charges: OT General Charges $OT Visit: 1 Visit OT Treatments $Self Care/Home Management : 38-52 mins  Warrick POUR OTR/L  Acute Rehab Services  [phone] office number   Warrick Berber 12/13/2023, 10:04 AM

## 2023-12-13 NOTE — Progress Notes (Signed)
 Heart Failure Nurse Navigator Progress Note    Patient original HF TOC appointment on 12/16/2023 was rescheduled to Monday, 12/19/2023 @ 10:30 am due to patient still hospitalized on 12/13/2023.   Stephane Haddock, BSN, Scientist, clinical (histocompatibility and immunogenetics) Only

## 2023-12-13 NOTE — Plan of Care (Signed)

## 2023-12-13 NOTE — Progress Notes (Signed)
 [facility]  Date of Admission:  12/04/2023      Total days of antibiotics 9        ASSESSMENT: Amanda Paul is a 48 y.o. female with   Sepsis, intra-abdominal abscesses Necrotic uterine fibroid, uterine rupture, secondary peritonitis - Group B Streptococcus -  S/P TAH/BSO 8/24 -  Drain output appears decreased / minimal. Will need repeated imaging prior to further decisions on duration of antibiotics. Pathology is benign.  - Continue Ancef  + metronidazole .  - Follow for timing of repeated abd CT scan  - Follow leukocytosis   Pleural Effusion / Pneumonia -  Still requiring 2 lpm oxygen. Received 5 days of broader coverage with cefepime /vancomycin .  May need to repeat xray vs CT scan if WBC remains elevated and still on supplemental O2. CHF/ fluid overload and hypoalbuminemia have also contributed to this picture.   PCN Allergy Hx -  She describes a fairly immediate reaction to a derivative of amoxicillin 20 years ago. This did not result in hospitalization but she recalls being told to take benadryl  when she went back to the pharmacy for help. Given more type 1 reaction described we recommended skin testing to further delineate. We are unable to offer this now as she is on histamine blockers.  - Would benefit from outpatient allergy/asthma referral after acute problems resolve.     PLAN: - Continue Ancef  + metronidazole .  - Follow for timing of repeated abd CT scan  - Follow leukocytosis  - May need to consider chest CT if leukocytosis persistently elevated    Principal Problem:   Acute hypoxic respiratory failure (HCC) Active Problems:   Fibroid   DM2 (diabetes mellitus, type 2) (HCC)   Sepsis (HCC)   CHF with unknown LVEF (HCC)   Pneumonia   Other ascites   Abscess of abdominal cavity (HCC)   Uterine leiomyoma    chewing gum (ORBIT) sugar free  1 Stick Oral QID   Chlorhexidine  Gluconate Cloth  6 each Topical  Daily   enoxaparin  (LOVENOX ) injection  60 mg Subcutaneous Q24H   famotidine   20 mg Oral BID   feeding supplement  237 mL Oral BID BM   lactulose   20 g Oral BID   lidocaine  1 %  10 mL Other Once   metroNIDAZOLE   500 mg Oral Q12H   pantoprazole   40 mg Oral Q1200   polyethylene glycol  17 g Oral QODAY   prenatal multivitamin  1 tablet Oral Q1200   simethicone   80 mg Oral TID   sodium chloride  flush  5 mL Intracatheter Q8H   spironolactone   25 mg Oral Daily   torsemide   40 mg Oral Daily    SUBJECTIVE: Feeling significantly better than when she was admitted.  Still requiring oxygen. Some discomfort at insertion site of JP drain.   She describes a fairly immediate reaction to a derivative of amoxicillin 20 years ago. This did not result in hospitalization but she recalls being told to take benadryl  when she went back to the pharmacy for help.   Review of Systems: ROS  Allergies  Allergen Reactions   Amoxicillin Hives, Itching and Swelling    Swollen eyes and throat.   Doxycycline  Hyclate Swelling    Bilateral lower leg swelling   Penicillins Hives and Swelling    OBJECTIVE: Vitals:   12/13/23 0538 12/13/23 0600 12/13/23 0633 12/13/23 0837  BP: (!) 135/99   104/73  Pulse: 95  93  Resp: 19 20 18  (!) 25  Temp: 98.9 F (37.2 C)   99.2 F (37.3 C)  TempSrc: Oral   Oral  SpO2: 90%   91%  Weight:   108.1 kg   Height:       Body mass index is 39.66 kg/m.  Physical Exam Constitutional:      Appearance: She is well-developed. She is ill-appearing.  Cardiovascular:     Rate and Rhythm: Normal rate.  Abdominal:     General: Bowel sounds are normal. There is distension.     Tenderness: There is abdominal tenderness (around drain site).     Comments: Drain is mostly serous appearing,      Lab Results Lab Results  Component Value Date   WBC 17.7 (H) 12/13/2023   HGB 9.5 (L) 12/13/2023   HCT 30.9 (L) 12/13/2023   MCV 81.3 12/13/2023   PLT 522 (H) 12/13/2023    Lab  Results  Component Value Date   CREATININE 0.70 12/13/2023   BUN 12 12/13/2023   NA 133 (L) 12/13/2023   K 3.7 12/13/2023   CL 87 (L) 12/13/2023   CO2 33 (H) 12/13/2023    Lab Results  Component Value Date   ALT 12 12/11/2023   AST 16 12/11/2023   ALKPHOS 53 12/11/2023   BILITOT 0.4 12/11/2023     Microbiology: Recent Results (from the past 240 hours)  Culture, blood (routine x 2)     Status: None   Collection Time: 12/04/23 11:47 AM   Specimen: BLOOD  Result Value Ref Range Status   Specimen Description   Final    BLOOD LEFT ANTECUBITAL Performed at [facility], [address], [place], [place] [address]    Special Requests   Final    BOTTLES DRAWN AEROBIC AND ANAEROBIC Blood Culture adequate volume Performed at [facility], [address] [place], [place] [address]    Culture   Final    NO GROWTH 5 DAYS Performed at [facility], [address] [place], [place] [address]    Report Status 12/09/2023 FINAL  Final  Aerobic/Anaerobic Culture w Gram Stain (surgical/deep wound)     Status: None   Collection Time: 12/04/23  6:14 PM   Specimen: Wound; Body Fluid  Result Value Ref Range Status   Specimen Description WOUND  Final   Special Requests NONE  Final   Gram Stain   Final    RARE WBC PRESENT, PREDOMINANTLY PMN NO ORGANISMS SEEN    Culture   Final    FEW GROUP B STREP(S.AGALACTIAE)ISOLATED TESTING AGAINST S. AGALACTIAE NOT ROUTINELY PERFORMED DUE TO PREDICTABILITY OF AMP/PEN/VAN SUSCEPTIBILITY. NO ANAEROBES ISOLATED Performed at [facility], [address], [place], [place] [address]    Report Status 12/09/2023 FINAL  Final  Aerobic/Anaerobic Culture w Gram Stain (surgical/deep wound)     Status: None (Preliminary result)   Collection Time: 12/11/23  8:45 AM   Specimen: Abscess  Result Value Ref Range Status   Specimen Description ABSCESS  Final   Special Requests NONE  Final   Gram Stain NO WBC SEEN NO  ORGANISMS SEEN   Final   Culture   Final    NO GROWTH 2 DAYS Performed at [facility], [address], [place], [place] [address]    Report Status PENDING  Incomplete    Corean Fireman, MSN, NP-C [facility]  [email or web address] Pager: [phone]  Office: [phone] [facility] Main Line: [phone] *Secure Chat Communication Welcome  Total Encounter Time: 25 m

## 2023-12-13 NOTE — Progress Notes (Signed)
 PT Cancellation Note  Patient Details Name: Amanda Paul MRN: [id number] DOB: 10-28-1975   Cancelled Treatment:    Reason Eval/Treat Not Completed: (P) Fatigue/lethargy limiting ability to participate (pt politely defers as she worked with OT in AM and walked ~5am with staff assist. Pt reports she is waiting for a wedge pillow delivery and is willing to work with therapist tonight/tomorrow depending on arrival of pillow to further assess for home DME needs if she will need hospital bed or not.) Pt defers gait trial at this time, citing that she already walked at 5am. She also has visitor in room. Will continue efforts per PT plan of care as schedule permits.   Cydnee Fuquay M Lathyn Griggs 12/13/2023, 3:22 PM

## 2023-12-13 NOTE — Progress Notes (Signed)
 PROGRESS NOTE    Amanda Paul  FMW:[id number] DOB: 01-09-1976 DOA: 12/04/2023 PCP: Jarold Medici, MD  48/F with obesity, hypertension, uterine fibroids was admitted to [facility] service 8/24 for hysterectomy for necrotic fibroid> chemical peritonitis, postop developed hypoxia, workup noted below for pleural effusions. - TRH consulting, started on diuretics - Also noted to be febrile, concern for sepsis postop, currently on broad-spectrum antibiotics - Persistent leukocytosis, CT 8/29 with multiple intra-abdominal abscesses  - IR consulted - 8/31, drain placed in IR into right subdiaphragmatic fluid collection  Subjective: - Some discomfort at the site of the drain, unable to take deep breaths  Assessment and Plan:  Acute hypoxic respiratory failure Acute diastolic CHF - Primarily secondary to volume overload, fever, sepsis also contributing -2D echo with preserved EF, grade 2 diastolic dysfunction, mildly reduced RV - Improved with diuresis, 13L negative, she is euvolemic now, continue torsemide  and Aldactone  - increase activity, out of bed to chair  Sepsis, intra-abdominal abscesses Necrotic uterine fibroid, uterine rupture, chemical peritonitis -ID following, now on IV Ancef , flagyl  - Repeat CT  with large intra-abdominal abscesses - IR following, sp percutaneous drain placed in right subdiaphragmatic fluid collection on 8/31 - Output from new drain is decreasing, may need repeat imaging tomorrow  Severe hypoalbuminemia - Albumin  is 1.7, likely worsened in the setting of above infection/sepsis, fatty liver noted on abdominal ultrasound in 12/24 - Definitely contributing to third spacing  Type 2 diabetes mellitus A1c is 6.9, CBGs are stable  DVT prophylaxis: Lovenox  Code Status: Full code Family Communication: None present Disposition Plan: Home pending improvement    Objective: Vitals:   12/13/23 0538 12/13/23 0600 12/13/23 0633 12/13/23 0837  BP: (!) 135/99    104/73  Pulse: 95   93  Resp: 19 20 18  (!) 25  Temp: 98.9 F (37.2 C)   99.2 F (37.3 C)  TempSrc: Oral   Oral  SpO2: 90%   91%  Weight:   108.1 kg   Height:        Intake/Output Summary (Last 24 hours) at 12/13/2023 1158 Last data filed at 12/13/2023 0520 Gross per 24 hour  Intake 1717 ml  Output 1204 ml  Net 513 ml   Filed Weights   12/11/23 0655 12/12/23 0500 12/13/23 9366  Weight: 114.3 kg 112.9 kg 108.1 kg    Examination:  General exam: Chronically ill-appearing, AAO x 3 HEENT: No JVD CVS: S1-S2, regular rhythm Lungs: Decreased breath sounds at the bases otherwise clear Abdomen: Soft, obese, mild right upper quadrant tenderness, JP drain with scant serosanguineous output  Central nervous system: Alert and oriented. No focal neurological deficits. Extremities: no edema Skin: No rashes Psychiatry:  Mood & affect appropriate.     Data Reviewed:   CBC: Recent Labs  Lab 12/09/23 0342 12/10/23 0346 12/11/23 0333 12/12/23 0500 12/13/23 0405  WBC 18.6* 20.3* 19.6* 18.3* 17.7*  HGB 9.5* 9.2* 9.1* 10.0* 9.5*  HCT 30.4* 29.6* 29.4* 32.4* 30.9*  MCV 80.9 80.9 81.0 81.4 81.3  PLT 399 415* 428* 472* 522*   Basic Metabolic Panel: Recent Labs  Lab 12/09/23 0342 12/10/23 0346 12/11/23 0333 12/12/23 0500 12/13/23 0405  NA 139 137 135 137 133*  K 3.8 3.5 3.7 4.2 3.7  CL 98 94* 91* 90* 87*  CO2 29 31 34* 33* 33*  GLUCOSE 102* 110* 119* 106* 109*  BUN 8 8 10 11 12   CREATININE 0.45 0.61 0.66 0.77 0.70  CALCIUM  8.9 8.7* 8.5* 8.9 8.5*   GFR: Estimated  Creatinine Clearance: 102.8 mL/min (by C-G formula based on SCr of 0.7 mg/dL). Liver Function Tests: Recent Labs  Lab 12/07/23 0306 12/08/23 0358 12/09/23 0342 12/10/23 0346 12/11/23 0333  AST 14* 12* 16 19 16   ALT 12 12 14 17 12   ALKPHOS 57 52 50 52 53  BILITOT 0.4 0.7 0.6 0.5 0.4  PROT 5.8* 6.6 6.2* 6.4* 6.8  ALBUMIN  1.7* 2.4* 2.0* 2.0* 2.1*   No results for input(s): LIPASE, AMYLASE in the last 168  hours.  No results for input(s): AMMONIA in the last 168 hours. Coagulation Profile: Recent Labs  Lab 12/10/23 1257  INR 1.2   Cardiac Enzymes: No results for input(s): CKTOTAL, CKMB, CKMBINDEX, TROPONINI in the last 168 hours. BNP (last 3 results) No results for input(s): PROBNP in the last 8760 hours. HbA1C: No results for input(s): HGBA1C in the last 72 hours.  CBG: Recent Labs  Lab 12/10/23 1727 12/10/23 2145 12/11/23 0650 12/11/23 1154 12/11/23 1533  GLUCAP 111* 115* 102* 98 105*   Lipid Profile: No results for input(s): CHOL, HDL, LDLCALC, TRIG, CHOLHDL, LDLDIRECT in the last 72 hours. Thyroid Function Tests: No results for input(s): TSH, T4TOTAL, FREET4, T3FREE, THYROIDAB in the last 72 hours.  Anemia Panel: No results for input(s): VITAMINB12, FOLATE, FERRITIN, TIBC, IRON , RETICCTPCT in the last 72 hours. Urine analysis:    Component Value Date/Time   COLORURINE YELLOW 12/05/2023 1720   APPEARANCEUR CLEAR 12/05/2023 1720   LABSPEC >1.046 (H) 12/05/2023 1720   PHURINE 5.0 12/05/2023 1720   GLUCOSEU NEGATIVE 12/05/2023 1720   HGBUR MODERATE (A) 12/05/2023 1720   BILIRUBINUR NEGATIVE 12/05/2023 1720   BILIRUBINUR negative 09/20/2023 1638   BILIRUBINUR negative 09/14/2021 0839   KETONESUR NEGATIVE 12/05/2023 1720   PROTEINUR 30 (A) 12/05/2023 1720   UROBILINOGEN 0.2 09/20/2023 1638   NITRITE NEGATIVE 12/05/2023 1720   LEUKOCYTESUR NEGATIVE 12/05/2023 1720   Sepsis Labs: @LABRCNTIP (procalcitonin:4,lacticidven:4)  ) Recent Results (from the past 240 hours)  Culture, blood (routine x 2)     Status: None   Collection Time: 12/04/23 11:47 AM   Specimen: BLOOD  Result Value Ref Range Status   Specimen Description   Final    BLOOD LEFT ANTECUBITAL Performed at [facility], [address], [place], [place] [address]    Special Requests   Final    BOTTLES DRAWN AEROBIC AND ANAEROBIC Blood  Culture adequate volume Performed at [facility], [address], [place], [place] [address]    Culture   Final    NO GROWTH 5 DAYS Performed at [facility], [address], [place], [place] [address]    Report Status 12/09/2023 FINAL  Final  Aerobic/Anaerobic Culture w Gram Stain (surgical/deep wound)     Status: None   Collection Time: 12/04/23  6:14 PM   Specimen: Wound; Body Fluid  Result Value Ref Range Status   Specimen Description WOUND  Final   Special Requests NONE  Final   Gram Stain   Final    RARE WBC PRESENT, PREDOMINANTLY PMN NO ORGANISMS SEEN    Culture   Final    FEW GROUP B STREP(S.AGALACTIAE)ISOLATED TESTING AGAINST S. AGALACTIAE NOT ROUTINELY PERFORMED DUE TO PREDICTABILITY OF AMP/PEN/VAN SUSCEPTIBILITY. NO ANAEROBES ISOLATED Performed at [facility], [address], [place], [place] [address]    Report Status 12/09/2023 FINAL  Final  Aerobic/Anaerobic Culture w Gram Stain (surgical/deep wound)     Status: None (Preliminary result)   Collection Time: 12/11/23  8:45 AM   Specimen: Abscess  Result Value Ref Range Status   Specimen Description ABSCESS  Final   Special Requests NONE  Final   Gram Stain NO WBC SEEN NO ORGANISMS SEEN   Final   Culture   Final    NO GROWTH 2 DAYS Performed at [facility], [address] [place], [place] [address]    Report Status PENDING  Incomplete     Radiology Studies: DG Chest Port 1 View Result Date: 12/12/2023 CLINICAL DATA:  [id number] Post-operative state 252351 EXAM: PORTABLE CHEST 1 VIEW COMPARISON:  December 08, 2023 FINDINGS: The cardiomediastinal silhouette is unchanged in contour with elevation of the RIGHT hemidiaphragm.RIGHT-sided surgical drain placed in a perihepatic fluid collection. Similar appearance of a RIGHT-sided pleural effusion. No pneumothorax. RIGHT basilar homogeneous opacity likely atelectasis. LEFT basilar heterogeneous opacity, nonspecific atherosclerotic  calcifications. IMPRESSION: 1. Similar appearance of a RIGHT-sided pleural effusion with RIGHT basilar atelectasis. 2. LEFT basilar heterogeneous opacity, nonspecific and could reflect atelectasis versus infection. Electronically Signed   By: Corean Salter M.D.   On: 12/12/2023 10:35     Scheduled Meds:  chewing gum (ORBIT) sugar free  1 Stick Oral QID   Chlorhexidine  Gluconate Cloth  6 each Topical Daily   [START ON 12/14/2023] enoxaparin  (LOVENOX ) injection  55 mg Subcutaneous Q24H   famotidine   20 mg Oral BID   feeding supplement  237 mL Oral BID BM   lactulose   20 g Oral BID   metroNIDAZOLE   500 mg Oral Q12H   pantoprazole   40 mg Oral Q1200   polyethylene glycol  17 g Oral QODAY   prenatal multivitamin  1 tablet Oral Q1200   simethicone   80 mg Oral TID   sodium chloride  flush  5 mL Intracatheter Q8H   spironolactone   25 mg Oral Daily   torsemide   40 mg Oral Daily   Continuous Infusions:   ceFAZolin  (ANCEF ) IV 2 g (12/13/23 0542)     LOS: 9 days    Time spent:    Sigurd Pac, MD [facility]   12/13/2023, 11:58 AM

## 2023-12-13 NOTE — Progress Notes (Signed)
 SATURATION QUALIFICATIONS: (This note is used to comply with regulatory documentation for home oxygen)  Patient Saturations on Room Air at Rest = 88%  Patient Saturations on Room Air while Ambulating = 78%  Patient Saturations on 2 Liters of oxygen while Resting = 94%  Warrick POUR OTR/L  Acute Rehab Services  [phone] office number

## 2023-12-13 NOTE — Progress Notes (Addendum)
 Gynecology Progress Note    Patient ID: Amanda Paul, female   DOB: 1975-10-03, 48 y.o.   MRN: [id number]  Admission Date: 12/04/2023 Current Date: 12/13/2023 10:41 AM  Amanda Paul is a 48 y.o. G0P0000 POD9 s/p TAH/BSO for necrotic fibroid and uterine rupture remote from [place] in 2022.   History complicated by: Patient Active Problem List   Diagnosis Date Noted   Other ascites 12/10/2023   Abscess of abdominal cavity (HCC) 12/10/2023   Uterine leiomyoma 12/10/2023   DM2 (diabetes mellitus, type 2) (HCC) 12/06/2023   Sepsis (HCC) 12/06/2023   Acute hypoxic respiratory failure (HCC) 12/06/2023   CHF with unknown LVEF (HCC) 12/06/2023   Pneumonia 12/06/2023   Fibroid 12/04/2023   Persistent cough 11/20/2023   Peripheral edema 11/20/2023   Chronic pain of both ankles 10/01/2023   Allergic rhinitis with postnasal drip 10/01/2023   Class 3 severe obesity due to excess calories with serious comorbidity and body mass index (BMI) of 45.0 to 49.9 in adult 03/21/2023   Pure hypercholesterolemia 03/21/2023   Coronary artery calcification 03/21/2023   Enlarged liver 03/21/2023   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  Subjective:  She reports feeling well overall. No concerns. Denies pain apart from the RUQ drain. +flatus and bowel movements.    Objective:    Current Vital Signs 24h Vital Sign Ranges  T 99.2 F (37.3 C) Temp  Avg: 99 F (37.2 C)  Min: 98.7 F (37.1 C)  Max: 99.2 F (37.3 C)  BP 104/73 BP  Min: 104/73  Max: 135/99  HR 93 Pulse  Avg: 92.5  Min: 90  Max: 95  RR (!) 25 Resp  Avg: 20  Min: 18  Max: 25  SaO2 91 % Room Air  SpO2  Avg: 90.3 %  Min: 89 %  Max: 91 %       24 Hour I/O Current Shift I/O  Time Ins Outs 09/01 0701 - 09/02 0700 In: 1717 [P.O.:717] Out: 1219 [Urine:1200; Drains:19] No intake/output data recorded.    New drain put out 326 cc thus far Patient Vitals for the past 24 hrs:  BP Temp Temp src Pulse Resp SpO2 Height Weight  12/13/23 0837 104/73 99.2 F (37.3 C) Oral 93 (!) 25 91 % -- --  12/13/23 9366 -- -- -- -- 18 -- -- 108.1 kg  12/13/23 0600 -- -- -- -- 20 -- -- --  12/13/23 0538 (!) 135/99 98.9 F (37.2 C) Oral 95 19 90 % -- --  12/13/23 0223 -- 98.9 F (37.2 C) Oral -- 20 -- -- --  12/12/23 2100 125/82 98.7 F (37.1 C) Oral 90 18 (!) 89 % -- --  12/12/23 1700 -- -- -- -- -- -- 5' 5 (1.651 m) --  12/12/23 1227 118/74 99.2 F (37.3 C) Oral 92 20 91 % -- --    Physical exam: General appearance: alert, cooperative, and appears stated age, sitting up in chair Abdomen: soft, nontender, non-distended, c/d/I incision with staples in place, JP with serosanguinous output   GU: No gross vaginal bleeding  Lungs: Normal respiratory effort, on oxygen Heart: regular rate Extremities: LE edema stable, non-pitting  Skin: warm/dry Psych:  appropriate Neurologic: Grossly normal  Labs  Recent Labs  Lab 12/11/23 0333 12/12/23 0500 12/13/23 0405  WBC 19.6* 18.3* 17.7*  HGB 9.1* 10.0* 9.5*  HCT 29.4* 32.4* 30.9*  PLT 428* 472* 522*    Recent Labs  Lab 12/09/23 0342 12/10/23 0346 12/11/23 0333 12/12/23 0500 12/13/23 0405  NA 139 137 135 137 133*  K 3.8 3.5 3.7 4.2 3.7  CL 98 94* 91* 90* 87*  CO2 29 31 34* 33* 33*  BUN 8 8 10 11 12   CREATININE 0.45 0.61 0.66 0.77 0.70  CALCIUM  8.9 8.7* 8.5* 8.9 8.5*  PROT 6.2* 6.4* 6.8  --   --   BILITOT 0.6 0.5 0.4  --   --   ALKPHOS 50 52 53  --   --   ALT 14 17 12   --   --   AST 16 19 16   --   --   GLUCOSE 102* 110* 119* 106* 109*   No results found.   Abscess cultures 8/31: no growth to date Blood cultures 8/24: no growth to  date Wound cultures 8/24: few GBS, no anaerobes   Assessment & Plan:  48 y.o. G0 POD9 from TAH/BSO for necrotic fibroid and uterine rupture remote from [place] in 2022. Clinical status is improving, though slowly, mostly complicated by her underlying medical status.  *GYN:  - Meeting surgical milestones - JP drain continuing with output of serosanguinous fluid  - Path benign  *ID: - currently on ancef  and flagyl  - WBC remains elevated at 17 but downtrending - afebrile currently - ID following - s/p abscess drainage, output is slowing from that drain, consider re-imaging tomorrow if continued low output  *Heme: - Hgb stable at this time. - Will eventually start PO iron  every other day as an outpt.   *CV/Resp: as per hospitalist team, appreciate recommendations, working on diuresis, requiring 2L oxygen currently and desats during ambulation  * T2DM: as per hospitalist team  *Pain:  - Well controlled - pain meds prn  *FEN/GI:  - Diet as tolerated.  - Encouraged continued high protein diet. Continue Ensure.   - Chewing gum. PPI.  - Hypokalemia - resolved s/p repletion.   *PPx:  - ppx Lovenox . Continue SCDs.  - Encourage ambulation - she is working with PT/OT.   *Dispo: pending, discussion of SNF vs home health, will review with primary team today  Code Status: Full Code  Rollo [name], MD 12/13/2023 10:41 AM

## 2023-12-14 ENCOUNTER — Inpatient Hospital Stay (HOSPITAL_COMMUNITY)

## 2023-12-14 DIAGNOSIS — E1169 Type 2 diabetes mellitus with other specified complication: Secondary | ICD-10-CM | POA: Diagnosis not present

## 2023-12-14 DIAGNOSIS — K651 Peritoneal abscess: Secondary | ICD-10-CM | POA: Diagnosis not present

## 2023-12-14 DIAGNOSIS — I5033 Acute on chronic diastolic (congestive) heart failure: Secondary | ICD-10-CM

## 2023-12-14 DIAGNOSIS — E66813 Obesity, class 3: Secondary | ICD-10-CM | POA: Diagnosis not present

## 2023-12-14 DIAGNOSIS — Z880 Allergy status to penicillin: Secondary | ICD-10-CM

## 2023-12-14 LAB — BASIC METABOLIC PANEL WITH GFR
Anion gap: 11 (ref 5–15)
BUN: 15 mg/dL (ref 6–20)
CO2: 33 mmol/L — ABNORMAL HIGH (ref 22–32)
Calcium: 8.4 mg/dL — ABNORMAL LOW (ref 8.9–10.3)
Chloride: 87 mmol/L — ABNORMAL LOW (ref 98–111)
Creatinine, Ser: 0.66 mg/dL (ref 0.44–1.00)
GFR, Estimated: >60 mL/min (ref >60)
Glucose, Bld: 101 mg/dL — ABNORMAL HIGH (ref 70–99)
Potassium: 3.6 mmol/L (ref 3.5–5.1)
Sodium: 131 mmol/L — ABNORMAL LOW (ref 135–145)

## 2023-12-14 LAB — CBC
HCT: 30.4 % — ABNORMAL LOW (ref 36.0–46.0)
Hemoglobin: 9.3 g/dL — ABNORMAL LOW (ref 12.0–15.0)
MCH: 24.9 pg — ABNORMAL LOW (ref 26.0–34.0)
MCHC: 30.6 g/dL (ref 30.0–36.0)
MCV: 81.5 fL (ref 80.0–100.0)
Platelets: 528 K/uL — ABNORMAL HIGH (ref 150–400)
RBC: 3.73 MIL/uL — ABNORMAL LOW (ref 3.87–5.11)
RDW: 17.9 % — ABNORMAL HIGH (ref 11.5–15.5)
WBC: 18.2 K/uL — ABNORMAL HIGH (ref 4.0–10.5)
nRBC: 0.1 % (ref 0.0–0.2)

## 2023-12-14 MED ORDER — IOHEXOL 350 MG/ML SOLN
75.0000 mL | Freq: Once | INTRAVENOUS | Status: AC | PRN
  Administered 2023-12-14: 75 mL via INTRAVENOUS

## 2023-12-14 NOTE — Progress Notes (Incomplete)
 HEART & VASCULAR TRANSITION OF CARE CONSULT NOTE     Referring Physician: *** Primary Cardiologist: Tobb, Kardie, DO PCP: Jarold Medici, MD   Chief Complaint: RV Dysfunction  HPI: Referred to clinic by *** for heart failure consultation.   Amanda Paul is a 48 y.o. female with history of HFpEF, morbid obesity, coronary calcification noted on chest CT, HLD, and HTN.   She has significant family history of heart disease. Mother has heart failure and severe COPD and he father had a mild heart attack 2 years ago  Recently admitted 12/06/23 for acute abdominal pain and found to have necrotic fibroid uterus with rupture s/p TAH/BSO c/b peritonitis s/p drain placement. Echo at the beginning of the admission showed new, mild RV dysfunction with overload, as well as moderately elevated pulmonary artery pressures. CTPE inconclusive for assessing for PE.   She presents for transition of care visit. Overall feeling ***. NYHA ***. Reports {Symptoms; cardiac:12860::dyspnea,fatigue}. Denies {Symptoms; cardiac:12860::chest pain,dyspnea,fatigue,near-syncope,orthopnea,palpitations,dizziness,abnormal bleeding}. Able to perform ADLs. Appetite okay. Weight at home ***. BP at home***. Compliant with all medications. Denies ETOH, tobacco, or drug use.    Cardiac Testing    Past Medical History:  Diagnosis Date   Allergy    Arthritis    Hernia, umbilical    Hyperlipidemia    Hypertension    Low grade squamous intraepithelial lesion (LGSIL) on cervicovaginal cytologic smear 06/10/2000   Morbid obesity (HCC)    Pain in right hand 01/19/2019   Prediabetes 03/21/2023   Umbilical hernia 04/14/2012   Uterine fibroids     No current facility-administered medications for this visit.   Current Outpatient Medications  Medication Sig Dispense Refill   furosemide  (LASIX ) 40 MG tablet TAKE 1 TABLET BY MOUTH EVERY DAY AS NEEDED 90 tablet 1   Facility-Administered  Medications Ordered in Other Visits  Medication Dose Route Frequency Provider Last Rate Last Admin   acetaminophen  (TYLENOL ) tablet 650 mg  650 mg Oral Q6H PRN Cleatus Moccasin, MD   650 mg at 12/14/23 9390   ceFAZolin  (ANCEF ) IVPB 2g/100 mL premix  2 g Intravenous Q8H Fleeta Rothman, Jomarie SAILOR, MD 200 mL/hr at 12/14/23 0612 2 g at 12/14/23 0612   chewing gum (ORBIT) sugar free  1 Stick Oral QID Cleatus Moccasin, MD   1 Stick at 12/14/23 9076   Chlorhexidine  Gluconate Cloth 2 % PADS 6 each  6 each Topical Daily Izell Harari, MD   6 each at 12/14/23 9076   diphenhydrAMINE  (BENADRYL ) injection 25 mg  25 mg Intravenous Q6H PRN Cleatus Moccasin, MD       enoxaparin  (LOVENOX ) injection 55 mg  55 mg Subcutaneous Q24H Jayne Vonn DEL, MD   55 mg at 12/14/23 9076   famotidine  (PEPCID ) tablet 20 mg  20 mg Oral BID Izell Harari, MD   20 mg at 12/14/23 9077   feeding supplement (ENSURE PLUS HIGH PROTEIN) liquid 237 mL  237 mL Oral BID BM Cleatus Moccasin, MD   237 mL at 12/14/23 1226   HYDROmorphone  (DILAUDID ) injection 0.5 mg  0.5 mg Intravenous Q4H PRN Patel, Ekta V, MD   0.5 mg at 12/13/23 0535   lactulose  (CHRONULAC ) 10 GM/15ML solution 20 g  20 g Oral BID Joseph, Preetha, MD   20 g at 12/13/23 1056   LORazepam  (ATIVAN ) tablet 0.5 mg  0.5 mg Oral Q4H PRN Lenon Marien CROME, MD       metroNIDAZOLE  (FLAGYL ) tablet 500 mg  500 mg Oral Q12H Fleeta Rothman,  Jomarie SAILOR, MD   500 mg at 12/14/23 9077   ondansetron  (ZOFRAN ) tablet 4 mg  4 mg Oral Q6H PRN Cleatus Moccasin, MD       Or   ondansetron  (ZOFRAN ) injection 4 mg  4 mg Intravenous Q6H PRN Cleatus Moccasin, MD       oxyCODONE  (Oxy IR/ROXICODONE ) immediate release tablet 5-10 mg  5-10 mg Oral Q4H PRN Cleatus Moccasin, MD   10 mg at 12/13/23 1913   pantoprazole  (PROTONIX ) EC tablet 40 mg  40 mg Oral Q1200 Joseph, Preetha, MD   40 mg at 12/14/23 1225   polyethylene glycol (MIRALAX  / GLYCOLAX ) packet 17 g  17 g Oral QODAY Pickens, Charlie, MD   17 g at 12/14/23 9076   prenatal  multivitamin tablet 1 tablet  1 tablet Oral Q1200 Cleatus Moccasin, MD   1 tablet at 12/14/23 1225   simethicone  (MYLICON) chewable tablet 80 mg  80 mg Oral TID Izell Harari, MD   80 mg at 12/14/23 9077   sodium chloride  flush (NS) 0.9 % injection 5 mL  5 mL Intracatheter Q8H Mugweru, Jon, MD   5 mL at 12/13/23 1406   spironolactone  (ALDACTONE ) tablet 25 mg  25 mg Oral Daily Joseph, Preetha, MD   25 mg at 12/14/23 9077   torsemide  (DEMADEX ) tablet 40 mg  40 mg Oral Daily Fairy Frames, MD   40 mg at 12/14/23 9077    Allergies  Allergen Reactions   Amoxicillin Hives, Itching and Swelling    Swollen eyes and throat.   Doxycycline  Hyclate Swelling    Bilateral lower leg swelling   Penicillins Hives and Swelling      Social History   Socioeconomic History   Marital status: Single    Spouse name: Not on file   Number of children: Not on file   Years of education: Not on file   Highest education level: Some college, no degree  Occupational History   Not on file  Tobacco Use   Smoking status: Never   Smokeless tobacco: Never  Vaping Use   Vaping status: Never Used  Substance and Sexual Activity   Alcohol use: Yes    Alcohol/week: 1.0 standard drink of alcohol    Types: 1 Cans of beer per week    Comment: rare   Drug use: No   Sexual activity: Not Currently  Other Topics Concern   Not on file  Social History Narrative   Marital status: single      Children: none      Lives: alone      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         Social Drivers of Health   Financial Resource Strain: Low Risk  (11/16/2023)   Overall Financial Resource Strain (CARDIA)    Difficulty of Paying Living Expenses: Not hard at all  Food Insecurity: No Food Insecurity (12/05/2023)   Hunger Vital Sign    Worried About Running Out of Food in the Last Year: Never true    Ran Out of Food in the Last Year: Never true  Transportation Needs: No Transportation Needs (12/05/2023)   PRAPARE -  Administrator, Civil Service (Medical): No    Lack of Transportation (Non-Medical): No  Physical Activity: Insufficiently Active (11/16/2023)   Exercise Vital Sign    Days of Exercise per Week: 1 day    Minutes of Exercise per Session: 10 min  Stress: No Stress Concern Present (11/16/2023)   [employer]  of Occupational Health - Occupational Stress Questionnaire    Feeling of Stress: Only a little  Social Connections: Moderately Isolated (11/16/2023)   Social Connection and Isolation Panel    Frequency of Communication with Friends and Family: More than three times a week    Frequency of Social Gatherings with Friends and Family: Three times a week    Attends Religious Services: 1 to 4 times per year    Active Member of Clubs or Organizations: No    Attends Banker Meetings: Not on file    Marital Status: Never married  Intimate Partner Violence: Unknown (12/12/2023)   Humiliation, Afraid, Rape, and Kick questionnaire    Fear of Current or Ex-Partner: Not on file    Emotionally Abused: No    Physically Abused: Not on file    Sexually Abused: Not on file      Family History  Problem Relation Age of Onset   Hypertension Mother    Colon polyps Neg Hx    Colon cancer Neg Hx    Esophageal cancer Neg Hx    Rectal cancer Neg Hx    Stomach cancer Neg Hx     There were no vitals filed for this visit.  PHYSICAL EXAM: General:  Well appearing. No respiratory difficulty HEENT: normal Neck: supple. no JVD. Carotids 2+ bilat; no bruits. No lymphadenopathy or thryomegaly appreciated. Cor: PMI nondisplaced. Regular rate & rhythm. No rubs, gallops or murmurs. Lungs: clear Abdomen: soft, nontender, nondistended. No hepatosplenomegaly. No bruits or masses. Good bowel sounds. Extremities: no cyanosis, clubbing, rash, edema Neuro: alert & oriented x 3, cranial nerves grossly intact. moves all 4 extremities w/o difficulty. Affect pleasant.  ECG:   ASSESSMENT &  PLAN:  RV Failure Pulmonary Hypertension  HFpEF - Echo 4/25 with normal RV function - Echo 8/25 with EF 55%, G2DD, d-shaped septum, mildly reduced RV function, RVSP 49.1 mmHg, concerning for acute PE - CTPE inconclusive for determining presence of PE. Needs V/Q scan and PFTs NYHA *** GDMT  Diuretic- BB- Ace/ARB/ARNI MRA SGLT2i    Referred to HFSW (PCP, Medications, Transportation, ETOH Abuse, Drug Abuse, Insurance, Financial ): Yes or No Refer to Pharmacy: Yes or No Refer to Home Health: Yes on No Refer to Advanced Heart Failure Clinic: Yes or no  Refer to General Cardiology: Yes or No  Follow up

## 2023-12-14 NOTE — Assessment & Plan Note (Addendum)
 08/24 total abdominal hysterectomy, bilateral salpingo oopherectomy.  08/31 drain catheter placement into right subdiaphragmatic fluid collection.   Blood culture with no growth  Wound culture group B streptococcus (agalactiae)   09.03 CT abdomen and pelvis with right subdiaphragmatic fluid collection has only minimally decreased in size, pigtail catheter within this collection.  Small right pleural effusion with right lower lobe atelectasis.  Large amount of stool in the colon.   Today wbc is 14.7 and T max 100,5 on 09/05 at 20:47 hrs.     Patient has been out of bed and improving po intake.  Post op abdominal pain is controlled.  Continue bowel regimen.   Plan to continue with po antibiotic therapy with levofloxacin  and metronidazole  for 3 weeks.  Outpatient follow up with ID and IR.

## 2023-12-14 NOTE — Plan of Care (Signed)

## 2023-12-14 NOTE — Hospital Course (Addendum)
 Consult for medical management for respiratory failure with hypoxemia, RV failure.   48/F with obesity, hypertension, uterine fibroids was admitted to [facility] service 8/24 for hysterectomy for necrotic fibroid> chemical peritonitis, postop developed hypoxia. Patient reported having dyspnea, lower extremity edema and abdominal pain.  At the time of consultation she was found volume overloaded, with blood pressure 120/64, HR 87, RR 20 and 02 saturation 91% on 4 L/min per [place].  Lungs with bilateral rales with no wheezing, heart with S1 and S2 present and regular, no murmurs, abdomen protuberant, and tender to palpation, positive lower extremity edema.   08/25 labs  ABG 7.46/ 46/ 66/ 32/ 95%   Na 137, K 3.7 Cl 105 bicarbonate 26 glucose 122, bun 11 cr 0,52  Albumin  2 AST 14 ALT 13  total bilirubin 0,4  BNP 226  Wbc 10.5 hgb 9,4 plt 359  TSH 0,33 Free T4 1.43   Chest radiograph with hypoinflation, right hemidiaphragm elevation, bilateral hilar vascular congestion. Small right pleural effusion.  EKG 143 bpm, normal axis, normal intervals, qtc 499, sinus rhythm with no significant ST segment or T wave changes. Low voltage.   08/26 Patient was placed on Bipap for respiratory distress. Placed on furosemide  for diuresis with improvement in her symptoms.   Positive leukocytosis, placed on empiric antibiotic therapy.   08/29 ID consulted, CT abdomen and pelvis multiple intra-abdominal abscesses  IR consulted 8/31, drain placed in IR into right subdiaphragmatic fluid collection 09/04 improved volume status. Workup for pulmonary hypertension.  09/05 transition to oral antibiotic therapy.  09/06 clinically continue to improved, plan to continue oral antibiotic therapy follow up with ID, GYN and IR as outpatient. Scheduled follow up with heart failure clinic.

## 2023-12-14 NOTE — Assessment & Plan Note (Signed)
Calculated BMI is 40,2

## 2023-12-14 NOTE — Assessment & Plan Note (Addendum)
 Echocardiogram with preserved LV systolic function with EF 55%, D shaped interventricular septum, RV with mild reduced systolic function, RVSP 49.1 mmHg, RA and LA with normal size, no significant valvular disease. Trivial pericardial effusion.   Echocardiogram from 07/2023 personally reviewed with some flattening D shape interventricular septum suggesting chronic pulmonary hypertension.   Acute on chronic cor pulmonale.  RV failure   CT chest with indeterminate examination for evaluation of the pulmonary arteries.   V/Q scan negative for pulmonary embolism and lower extremity doppler ultrasound negative for deep vein thrombosis.   Patient was placed on IV furosemide  for diuresis, negative fluid balance was achieved, -13,742 ml with significant improvement in her symptoms.   Continue medical therapy with torsemide  40 mg po daily Spironolactone  25 mg po daily Hold on amlodipine  to avoid hypotension.   Acute cardiogenic pulmonary edema, with right pleural effusion.  Volume status improved along with oxygenation.  CT from 09/03 showing small right pleural effusion.    At the time of discharge her 02 saturation is 95% on room air.  Plan to continue diuresis and incentive spirometer.

## 2023-12-14 NOTE — Progress Notes (Signed)
 Occupational Therapy Treatment Patient Details Name: Amanda Paul MRN: [id number] DOB: 1975-07-22 Today's Date: 12/14/2023   History of present illness Pt is 48 year old presented to [facility] on  12/04/23 for acute abd pain. Pt transferred to [facility] with uterine rupture of necrotic fibroid with chemical peritonitis. Pt underwent emergent total abdominal hysterectomy and bil salpingo-oopherectomy. PMH - uterine fibroids, htn, obesity,   OT comments  Pt presented finishing with Pt but agreeable to work with Occupational Therapy with light ADLs at sink level. Pt completed ambulation with CGA to the bathroom with BSC over toilet and was able to complete sit to stand transfers with mod I. Pt then completed sitting to standing ADLs at the sink with increase in time with o2 on RA 91-86%. Pt making slow progress to goals and recommendation for [facility] but may not need with decrease in pain.       If plan is discharge home, recommend the following:  A little help with walking and/or transfers;A little help with bathing/dressing/bathroom;Assist for transportation;Help with stairs or ramp for entrance   Equipment Recommendations  Tub/shower bench    Recommendations for Other Services      Precautions / Restrictions Precautions Precautions: Fall;Other (comment) Recall of Precautions/Restrictions: Intact Precaution/Restrictions Comments: abd protection precs, watch O2, 2 JP drain Restrictions Weight Bearing Restrictions Per Provider Order: No       Mobility Bed Mobility               General bed mobility comments: Pt finished with Pt in chair    Transfers Overall transfer level: Modified independent Equipment used: None Transfers: Sit to/from Stand Sit to Stand: Modified independent (Device/Increase time)                 Balance Overall balance assessment: Mild deficits observed, not formally tested Sitting-balance support: Feet supported Sitting balance-Leahy Scale: Good      Standing balance support: No upper extremity supported Standing balance-Leahy Scale: Fair Standing balance comment: pt with short distance ambulation with intermittent UE support                           ADL either performed or assessed with clinical judgement   ADL Overall ADL's : Needs assistance/impaired Eating/Feeding: Independent   Grooming: Independent;Sitting   Upper Body Bathing: Independent;Sitting   Lower Body Bathing: Contact guard assist;Sit to/from stand   Upper Body Dressing : Independent;Sitting   Lower Body Dressing: Contact guard assist;Sit to/from stand   Toilet Transfer: Contact guard assist   Toileting- Clothing Manipulation and Hygiene: Contact guard assist       Functional mobility during ADLs: Contact guard assist      Extremity/Trunk Assessment Upper Extremity Assessment Upper Extremity Assessment: Overall WFL for tasks assessed   Lower Extremity Assessment Lower Extremity Assessment: Defer to PT evaluation        Vision   Vision Assessment?:  (wears contacts)   Perception Perception Perception: Within Functional Limits   Praxis Praxis Praxis: WFL   Communication Communication Communication: No apparent difficulties   Cognition Arousal: Alert Behavior During Therapy: WFL for tasks assessed/performed Cognition: No apparent impairments                               Following commands: Intact        Cueing   Cueing Techniques: Verbal cues, Gestural cues  Exercises      Shoulder  Instructions       General Comments No significant signs/symptoms of cardiac/respiratory distress. Pt on room air with O2 sats in 90's during activity    Pertinent Vitals/ Pain       Pain Assessment Pain Assessment: Faces Faces Pain Scale: Hurts little more Pain Location: abdomen with movement and R JP drain Pain Descriptors / Indicators: Guarding Pain Intervention(s): Limited activity within patient's tolerance,  Monitored during session  Home Living                                          Prior Functioning/Environment              Frequency  Min 2X/week        Progress Toward Goals  OT Goals(current goals can now be found in the care plan section)  Progress towards OT goals: Progressing toward goals  Acute Rehab OT Goals Patient Stated Goal: to go home OT Goal Formulation: With patient Time For Goal Achievement: 12/20/23 Potential to Achieve Goals: Good ADL Goals Pt Will Perform Upper Body Dressing: with modified independence Pt Will Perform Lower Body Dressing: with modified independence;with adaptive equipment;sit to/from stand Pt Will Transfer to Toilet: with modified independence Pt Will Perform Toileting - Clothing Manipulation and hygiene: with modified independence  Plan      Co-evaluation                 AM-PAC OT 6 Clicks Daily Activity     Outcome Measure   Help from another person eating meals?: None Help from another person taking care of personal grooming?: None Help from another person toileting, which includes using toliet, bedpan, or urinal?: A Little Help from another person bathing (including washing, rinsing, drying)?: A Little Help from another person to put on and taking off regular upper body clothing?: None Help from another person to put on and taking off regular lower body clothing?: A Little 6 Click Score: 21    End of Session    OT Visit Diagnosis: Unsteadiness on feet (R26.81);Other abnormalities of gait and mobility (R26.89);Pain Pain - part of body:  (RUE flank were drain is placed)   Activity Tolerance Patient tolerated treatment well   Patient Left in bed;with call bell/phone within reach   Nurse Communication Mobility status        Time: 8775-8685 OT Time Calculation (min): 50 min  Charges: OT General Charges $OT Visit: 1 Visit OT Treatments $Self Care/Home Management : 38-52 mins  Warrick POUR  OTR/L  Acute Rehab Services  [phone] office number   Warrick Berber 12/14/2023, 1:19 PM

## 2023-12-14 NOTE — Progress Notes (Signed)
 Gynecology Progress Note    Patient ID: Amanda Paul, female   DOB: 03-Dec-1975, 48 y.o.   MRN: [id number]  Admission Date: 12/04/2023 Current Date: 12/14/2023 12:29 PM  Amanda Paul is a 48 y.o. G0P0000 POD10 s/p TAH/BSO for necrotic fibroid and uterine rupture remote from [place] in 2022.   History complicated by: Patient Active Problem List   Diagnosis Date Noted   Obesity, class 3 12/14/2023   Other ascites 12/10/2023   Abscess of abdominal cavity (HCC) 12/10/2023   Uterine leiomyoma 12/10/2023   DM2 (diabetes mellitus, type 2) (HCC) 12/06/2023   Sepsis (HCC) 12/06/2023   Acute hypoxic respiratory failure (HCC) 12/06/2023   CHF with unknown LVEF (HCC) 12/06/2023   Pneumonia 12/06/2023   Fibroid 12/04/2023   Persistent cough 11/20/2023   Peripheral edema 11/20/2023   Chronic pain of both ankles 10/01/2023   Allergic rhinitis with postnasal drip 10/01/2023   Class 3 severe obesity due to excess calories with serious comorbidity and body mass index (BMI) of 45.0 to 49.9 in adult 03/21/2023   Pure hypercholesterolemia 03/21/2023   Coronary artery calcification 03/21/2023   Enlarged liver 03/21/2023   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  Subjective:  No concerns reported. Denies pain apart from the RUQ drain. +flatus and bowel movements.  Tolerating more food intake.  Objective:    Current Vital Signs 24h Vital Sign Ranges  T 99.2 F (37.3 C) Temp  Avg: 99.1 F (37.3 C)  Min: 98.4 F (36.9 C)  Max: 99.7 F (37.6 C)  BP 128/83 BP  Min: 106/54  Max: 128/83  HR 79 Pulse  Avg: 85  Min: 77  Max: 97  RR (!) 21 Resp  Avg: 22  Min:  11  Max: 30  SaO2 93 % Room Air SpO2  Avg: 94.9 %  Min: 90 %  Max: 100 %       24 Hour I/O Current Shift I/O  Time Ins Outs 09/02 0701 - 09/03 0700 In: 245 [P.O.:240] Out: 367 [Urine:350; Drains:17] 09/03 0701 - 09/03 1900 In: 240 [P.O.:240] Out: 1200 [Urine:1200]    New drain put out 326 cc thus far Patient Vitals for the past 24 hrs:  BP Temp Temp src Pulse Resp SpO2 Weight  12/14/23 1157 128/83 99.2 F (37.3 C) Oral 79 (!) 21 93 % --  12/14/23 0747 126/67 99.2 F (37.3 C) Oral 84 (!) 29 95 % --  12/14/23 0443 122/74 98.4 F (36.9 C) Oral 77 20 99 % 109.8 kg  12/14/23 0014 122/69 99.7 F (37.6 C) Oral 86 20 90 % --  12/13/23 1900 123/76 98.7 F (37.1 C) Oral 97 -- 93 % --  12/13/23 1643 118/79 98.8 F (37.1 C) Oral 81 (!) 23 99 % --  12/13/23 1628 (!) 106/54 98.9 F (37.2 C) Oral 87 (!) 30 100 % --  12/13/23 1236 120/86 99.5 F (37.5 C) Oral 89 11 90 % --    Physical exam: General appearance: alert, cooperative, and appears stated age, sitting up in chair Abdomen: soft, nontender, non-distended, c/d/i incision with staples in place, JP with small amount of serosanguinous output   GU: No gross vaginal bleeding  Lungs: Normal respiratory effort, on oxygen Heart: regular rate Extremities: LE edema stable, non-pitting  Skin: warm/dry Psych: appropriate Neurologic: Grossly normal  Labs  Recent Labs  Lab 12/12/23 0500 12/13/23 0405 12/14/23 0335  WBC 18.3* 17.7* 18.2*  HGB 10.0* 9.5* 9.3*  HCT 32.4* 30.9* 30.4*  PLT 472* 522* 528*    Recent Labs  Lab 12/09/23 0342 12/10/23 0346 12/11/23 0333 12/12/23 0500 12/13/23 0405 12/14/23 0335  NA 139 137 135 137 133* 131*  K 3.8 3.5 3.7 4.2 3.7 3.6  CL 98 94* 91* 90* 87* 87*  CO2 29 31 34* 33* 33* 33*  BUN 8 8 10 11 12 15   CREATININE 0.45 0.61 0.66 0.77 0.70 0.66  CALCIUM  8.9 8.7* 8.5* 8.9 8.5* 8.4*  PROT 6.2* 6.4* 6.8  --   --   --   BILITOT 0.6 0.5 0.4  --   --   --   ALKPHOS 50 52 53  --   --   --   ALT  14 17 12   --   --   --   AST 16 19 16   --   --   --   GLUCOSE 102* 110* 119* 106* 109* 101*   No results found.   Abscess cultures 8/31: no growth to date Blood cultures 8/24: no growth to date Wound cultures 8/24: few GBS, no anaerobes   Assessment & Plan:  48 y.o. G0 POD10 from TAH/BSO for necrotic fibroid and uterine rupture remote from [place] in 2022. Clinical status is improving, though slowly, mostly complicated by her underlying medical status.  *GYN:  - Meeting surgical milestones - JP drain continuing with output of serosanguinous fluid  - Path benign  *ID: - currently on ancef  and flagyl  - WBC remains elevated at 18 - afebrile currently - ID following - s/p abscess drainage on 8/31.  - CT ordered to evaluate right subdiaphragmatic fluid collection s/p aspiration on 8/31 given low outout of JP drain.   *Heme: - Hgb stable at this time. - Will eventually start PO iron  every other day as an outpt.   *CV/Resp: as per hospitalist team, appreciate recommendations, working on diuresis, requiring 2L oxygen currently and desats during ambulation  * T2DM: as per hospitalist team  *Pain:  - Well controlled - pain meds prn  *FEN/GI:  - Diet as tolerated.  - Encouraged continued high protein diet. Continue Ensure.   - Chewing gum. PPI.  - Hypokalemia - resolved s/p repletion.   *PPx:  - ppx Lovenox . Continue SCDs.  - Encourage ambulation - she is working with PT/OT.   *Dispo: pending CT results, discussion of SNF vs home health, primary team concerns  Code Status: Full Code  Gloris Hugger, MD [phone] 12/14/2023 12:29 PM

## 2023-12-14 NOTE — Progress Notes (Addendum)
  Progress Note   Patient: Amanda Paul FMW:[id number] DOB: 01-05-1976 DOA: 12/04/2023     10 DOS: the patient was seen and examined on 12/14/2023   Brief hospital course: Consult for medical management for respiratory failure with hypoxemia.   48/F with obesity, hypertension, uterine fibroids was admitted to [facility] service 8/24 for hysterectomy for necrotic fibroid> chemical peritonitis, postop developed hypoxia, workup noted below for pleural effusions.  Chest radiograph with hypoinflation, right hemidiaphragm elevation, bilateral hilar vascular congestion.    EKG 143 bpm, normal axis, normal intervals, qtc 499, sinus rhythm with no significant ST segment or T wave changes. Low voltage.   TRH consulting, started on diuretics - Also noted to be febrile, concern for sepsis postop, currently on broad-spectrum antibiotics - Persistent leukocytosis, CT 8/29 with multiple intra-abdominal abscesses  - IR consulted - 8/31, drain placed in IR into right subdiaphragmatic fluid collection  Assessment and Plan: * Abscess of abdominal cavity (HCC) 08/24 total abdominal hysterectomy, bilateral salpingo oopherectomy.  08/31 drain catheter placement into right subdiaphragmatic fluid collection.   Blood culture with no growth  Wound culture group B streptococcus (agalactiae)   Patient has been out of bed and improving po intake.  Post op abdominal pain is improving.  Plan to continue ancef  and metronidazole  Follow up repeat CT abdomen and pelvis.   Acute on chronic diastolic (congestive) heart failure (HCC) Echocardiogram with preserved LV systolic function with EF 55%, D shaped interventricular septum, RV with mild reduced systolic function, RVSP 49.1 mmHg, RA and LA with normal size, no significant valvular disease. Trivial pericardial effusion.   Acute on chronic cor pulmonale.  RV failure   CT chest with indeterminate examination for evaluation of the pulmonary arteries.   Volume  status is negative -13,979 ml  Systolic blood pressure 120 mmHg range.   Continue torsemide  40 mg po daily Spironolactone  25 mg po daily Check US  lower extremities   DM2 (diabetes mellitus, type 2) (HCC) Glucose has been stable.   Severe hypoalbuminemia.    Obesity, class 3 Calculated BMI is 40,2         Subjective: Patient with improvement in dyspnea but not yet at baseline, abdominal pain is controlled with analgesics, she has not moved her bowels yet. Her po intake has increased.   Physical Exam: Vitals:   12/13/23 1900 12/14/23 0014 12/14/23 0443 12/14/23 0747  BP: 123/76 122/69 122/74 126/67  Pulse: 97 86 77 84  Resp:  20 20 (!) 29  Temp: 98.7 F (37.1 C) 99.7 F (37.6 C) 98.4 F (36.9 C) 99.2 F (37.3 C)  TempSrc: Oral Oral Oral Oral  SpO2: 93% 90% 99% 95%  Weight:   109.8 kg   Height:       Neurology awake and alert ENT with mild pallor Cardiovascular with S1 and S2 present and regular with no gallops, rubs or murmurs No JVD Respiratory with poor inspiratory effort with no wheezing or rhonchi  Abdomen with mild distention, protuberant, not tender to superficial palpation, bilateral drains in place No lower extremity edema   Data Reviewed:    Family Communication: no family at the bedside   Disposition: Status is: Inpatient Remains inpatient appropriate because: recovering from sepsis   Planned Discharge Destination: Home     Author: Elidia Toribio Furnace, MD 12/14/2023 9:29 AM  For on call review [email or web address].

## 2023-12-14 NOTE — Progress Notes (Signed)
 Referring Physician(s): Jayne Minder, MD  Supervising Physician: Jennefer Rover  Patient Status:  [facility] - In-pt  Chief Complaint: RUQ fluid collection s/p IR drain placement 12/11/23 (Dr. Hughes)  Subjective:  Patient seen in her room, getting ready to work with PT. She reports some pain at the RUQ drain insertion site which is unchanged from placement, LLQ drain remains in place per surgery team. Less output from drain over the last 24 hours (~10 cc) and fluid appears more clear to her, she notes there were some concerns about the bulb on the IR drain not staying charged but appears to be working ok right now.  Allergies: Amoxicillin, Doxycycline  hyclate, and Penicillins  Medications: Prior to Admission medications   Medication Sig Start Date End Date Taking? Authorizing Provider  amLODipine  (NORVASC ) 10 MG tablet TAKE 1 TABLET BY MOUTH EVERY DAY 06/27/23  Yes Jarold Medici, MD  cetirizine (ZYRTEC) 10 MG tablet Take 10 mg by mouth daily.   Yes [provider]  Cholecalciferol (VITAMIN D ) 50 MCG (2000 UT) CAPS Take 2,000 Units by mouth at bedtime.   Yes [provider]  ibuprofen  (ADVIL ) 600 MG tablet Take 1 tablet (600 mg total) by mouth every 6 (six) hours as needed. Patient taking differently: Take 600 mg by mouth 2 (two) times daily as needed for mild pain (pain score 1-3) or moderate pain (pain score 4-6). 11/24/23  Yes Nivia Colon, PA-C  Multiple Vitamins-Minerals (MULTIVITAMIN WITH MINERALS) tablet Take 1 tablet by mouth daily.   Yes [provider]  rosuvastatin  (CRESTOR ) 20 MG tablet Take 1 tablet (20 mg total) by mouth daily. Patient taking differently: Take 20 mg by mouth at bedtime. 09/20/23 09/19/24 Yes Jarold Medici, MD  azithromycin  (ZITHROMAX ) 250 MG tablet Take 2 tablets together on day 1, then take 1 tablet daily for 4 days Patient not taking: Reported on 12/06/2023 09/24/23   Rising, [place]berry, PA-C  benzonatate  (TESSALON  PERLES) 100 MG capsule  Take 1 capsule (100 mg total) by mouth 3 (three) times daily as needed for cough. Patient not taking: Reported on 12/06/2023 10/30/23 10/29/24  Christopher Savannah, PA-C  furosemide  (LASIX ) 40 MG tablet TAKE 1 TABLET BY MOUTH EVERY DAY AS NEEDED 12/08/23   Jarold Medici, MD  meloxicam  (MOBIC ) 15 MG tablet TAKE 1 TABLET (15 MG TOTAL) BY MOUTH DAILY. Patient not taking: Reported on 12/06/2023 09/19/23   Hyatt, Max T, DPM  polyethylene glycol-electrolytes (NULYTELY ) 420 g solution Take 4,000 mLs by mouth once. Patient not taking: No sig reported 09/16/23   [provider]  predniSONE  (DELTASONE ) 20 MG tablet Take 2 tablets daily with breakfast. Patient not taking: Reported on 12/06/2023 10/30/23   Christopher Savannah, PA-C     Vital Signs: BP 128/83 (BP Location: Right Wrist)   Pulse 79   Temp 99.2 F (37.3 C) (Oral)   Resp (!) 21   Ht 5' 5 (1.651 m)   Wt 242 lb (109.8 kg)   LMP 09/14/2023   SpO2 93%   BMI 40.27 kg/m   Physical Exam Vitals reviewed.  Constitutional:      General: She is not in acute distress. HENT:     Head: Normocephalic.  Cardiovascular:     Rate and Rhythm: Normal rate.  Pulmonary:     Effort: Pulmonary effort is normal.  Abdominal:     Comments: (+) RUQ drain to suction bulb with ~10 cc serous fluid/flush material in bulb. Difficult to flush initially, was able to aspirate some fibrinous debris  and drain was easily flushed with 5 cc NS after that. No leakage from site, no pain during injection. Aspirate clear very light yellow. Insertion site clean, dry, dressed appropriately. (+) LLQ surgical drain in place with bloody output.  Skin:    General: Skin is warm and dry.  Neurological:     Mental Status: She is alert. Mental status is at baseline.  Psychiatric:        Mood and Affect: Mood normal.        Behavior: Behavior normal.        Thought Content: Thought content normal.        Judgment: Judgment normal.     Imaging: DG Chest Port 1 View Result Date:  12/12/2023 CLINICAL DATA:  [id number] Post-operative state [id number] EXAM: PORTABLE CHEST 1 VIEW COMPARISON:  December 08, 2023 FINDINGS: The cardiomediastinal silhouette is unchanged in contour with elevation of the RIGHT hemidiaphragm.RIGHT-sided surgical drain placed in a perihepatic fluid collection. Similar appearance of a RIGHT-sided pleural effusion. No pneumothorax. RIGHT basilar homogeneous opacity likely atelectasis. LEFT basilar heterogeneous opacity, nonspecific atherosclerotic calcifications. IMPRESSION: 1. Similar appearance of a RIGHT-sided pleural effusion with RIGHT basilar atelectasis. 2. LEFT basilar heterogeneous opacity, nonspecific and could reflect atelectasis versus infection. Electronically Signed   By: Corean Salter M.D.   On: 12/12/2023 10:35   CT GUIDED PERITONEAL/RETROPERITONEAL FLUID DRAIN BY PERC CATH Result Date: 12/11/2023 INDICATION: [id number] Intraabdominal fluid collection [id number] EXAM: CT-GUIDED RIGHT SUBDIAPHRAGMATIC FLUID COLLECTION DRAINAGE CATHETER PLACEMENT COMPARISON:  CT AP, 12/09/2023 MEDICATIONS: The patient is currently admitted to the hospital and receiving intravenous antibiotics. The antibiotics were administered within an appropriate time frame prior to the initiation of the procedure. ANESTHESIA/SEDATION: Moderate (conscious) sedation was employed during this procedure. A total of Versed  2 mg and Fentanyl  100 mcg was administered intravenously. Moderate Sedation Time: 15 minutes. The patient's level of consciousness and vital signs were monitored continuously by radiology nursing throughout the procedure under my direct supervision. CONTRAST:  None FLUOROSCOPY: CT dose in mGy was not provided. COMPLICATIONS: None immediate. PROCEDURE: RADIATION DOSE REDUCTION: This exam was performed according to the departmental dose-optimization program which includes automated exposure control, adjustment of the mA and/or kV according to patient size and/or use of iterative  reconstruction technique. Informed written consent was obtained from the patient after a discussion of the risks, benefits and alternatives to treatment. The patient was placed supine on the CT gantry and a pre procedural CT was performed re-demonstrating the known abscess/fluid collection within the RIGHT subdiaphragmatic abdomen. The procedure was planned. A timeout was performed prior to the initiation of the procedure. The RIGHT axilla was prepped and draped in the usual sterile fashion. The overlying soft tissues were anesthetized with 1% lidocaine  with epinephrine. Appropriate trajectory was planned with the use of a 22 gauge spinal needle. An 18 gauge trocar needle was advanced into the abscess/fluid collection and a short Amplatz super stiff wire was coiled within the collection. Appropriate positioning was confirmed with a limited CT scan. The tract was serially dilated allowing placement of a 12 Fr drainage catheter. Appropriate positioning was confirmed with a limited postprocedural CT scan. 5 mL of thin serous peritoneal fluid was aspirated. The tube was connected to a bulb suction and sutured in place. A dressing was placed. The patient tolerated the procedure well without immediate post procedural complication. IMPRESSION: Successful CT-guided placement of a 12 Fr drainage catheter into the RIGHT subdiaphragmatic abdomen Diagnostic aspiration of 5 mL of thin serous peritoneal fluid. Samples were  sent to the laboratory as requested by the ordering clinical team. RECOMMENDATIONS: The patient will return to V[facility]iology (VIR) for routine drainage catheter evaluation in 5-7 days. Thom Hall, MD [facility] Electronically Signed   By: Thom Hall M.D.   On: 12/11/2023 11:17    Labs:  CBC: Recent Labs    12/11/23 0333 12/12/23 0500 12/13/23 0405 12/14/23 0335  WBC 19.6* 18.3* 17.7* 18.2*  HGB 9.1* 10.0* 9.5* 9.3*   HCT 29.4* 32.4* 30.9* 30.4*  PLT 428* 472* 522* 528*    COAGS: Recent Labs    12/04/23 1454 12/10/23 1257  INR 1.2 1.2  APTT 31  --     BMP: Recent Labs    12/11/23 0333 12/12/23 0500 12/13/23 0405 12/14/23 0335  NA 135 137 133* 131*  K 3.7 4.2 3.7 3.6  CL 91* 90* 87* 87*  CO2 34* 33* 33* 33*  GLUCOSE 119* 106* 109* 101*  BUN 10 11 12 15   CALCIUM  8.5* 8.9 8.5* 8.4*  CREATININE 0.66 0.77 0.70 0.66  GFRNONAA >60 >60 >60 >60    LIVER FUNCTION TESTS: Recent Labs    12/08/23 0358 12/09/23 0342 12/10/23 0346 12/11/23 0333  BILITOT 0.7 0.6 0.5 0.4  AST 12* 16 19 16   ALT 12 14 17 12   ALKPHOS 52 50 52 53  PROT 6.6 6.2* 6.4* 6.8  ALBUMIN  2.4* 2.0* 2.0* 2.1*    Assessment and Plan:  48 y/o F with history of uterine rupture with necrosis and peritonitis requiring emergency total abdominal hysterectomy and bilateral salpingo-oophorectomy 12/04/23. She was found to have a post operative RUQ fluid collection and underwent IR drain placement 12/11/23, seen today for drain follow up.  Culture of aspirate NGTD, WBC about the same (18.2), afebrile, vital signs stable. Currently receiving ancef  Q8H + Flagyl  Q12H.   Drain Location: RUQ Size: Fr size: 12 Fr Date of placement: 12/11/23  Currently to: Drain collection device: suction bulb 24 hour output:  Output by Drain (mL) 12/12/23 0701 - 12/12/23 1900 12/12/23 1901 - 12/13/23 0700 12/13/23 0701 - 12/13/23 1900 12/13/23 1901 - 12/14/23 0700 12/14/23 0701 - 12/14/23 1453  Closed System Drain 1 Left LLQ Bulb (JP) 5 2 5   0  Closed System Drain 1 Lateral RUQ Bulb (JP) 12 Fr. 10 2 12   0    Interval imaging/drain manipulation:  None - CT abd/pelvis w/contrast ordered today per OB/GYN  Current examination: Drain initially unable to be flushed, after aspirating fibrinous debris at beside the drain was subsequently easily flushed with 5 cc NS. No leakage from insertion site.  Insertion site unremarkable.  Suture and stat lock in  place. Dressed appropriately.  Per patient bulb was not retaining suction yesterday, appears to be fine on exam today after flushing - have asked RN to monitor and will tube up new bulb if needed.  Plan: CT scan today per OB/GYN due to decreasing drain output - will follow up on results 9/4 to determine if IR drain can be removed. Continue TID flushes with 5 cc NS. Record output Q shift. Dressing changes QD or PRN if soiled.  Call IR APP or on call IR MD if difficulty flushing or sudden change in drain output.  Repeat imaging/possible drain injection once output < 10 mL/QD (excluding flush material). Consideration for drain removal if output is < 10 mL/QD (excluding flush material), pending discussion with the providing surgical service.  Discharge planning: Please contact IR APP or on  call IR MD prior to patient d/c to ensure appropriate follow up plans are in place. Typically patient will follow up with IR clinic 10-14 days post d/c for repeat imaging/possible drain injection. IR scheduler will contact patient with date/time of appointment. Patient will need to flush drain QD with 5 cc NS, record output QD, dressing changes every 2-3 days or earlier if soiled.   IR will continue to follow - please call with questions or concerns.  Electronically Signed: Clotilda DELENA Hesselbach, PA-C 12/14/2023, 12:21 PM   I spent a total of 15 Minutes at the the patient's bedside AND on the patient's hospital floor or unit, greater than 50% of which was counseling/coordinating care for RUQ fluid collection.

## 2023-12-14 NOTE — Progress Notes (Signed)
 Pt very motivated in self care today. Educated on how to do her dressing changes and what to watch for with infection. Ambulated well with assistance and pt verbalized wanted to do as much independently as she can. Allowed independence while maintaining safety.

## 2023-12-14 NOTE — Progress Notes (Signed)
 [facility]  Date of Admission:  12/04/2023      Total days of antibiotics 10        ASSESSMENT: Amanda Paul is a 48 y.o. female with   Sepsis, intra-abdominal abscesses Necrotic uterine fibroid, uterine rupture, secondary peritonitis - Group B Streptococcus -  S/P TAH/BSO 8/24 -  Drain output appears decreased / minimal. Will need repeated imaging prior to further decisions on duration of antibiotics - to be done today. Pathology is benign. She feels much improved and holding down medications. I think we can switch to oral Levaquin  + metronidazole  if her CT scan comes back reassuring for adequate drainage.  - Continue Ancef  + metronidazole .  - Follow results of abd CT scan 9/3 - Follow leukocytosis  - Consider switch to oral treatment tomorrow  Pleural Effusion / Pneumonia -  Oxygen is off today. No significant cough or SOB.  Received 5 days of broader coverage with cefepime /vancomycin .  If WBC remains elevated and back on supplemental O2 may need to consider chest imaging. CHF/ fluid overload and hypoalbuminemia have also contributed to this picture.   PCN Allergy Hx -  She describes a fairly immediate reaction to a derivative of amoxicillin 20 years ago. This did not result in hospitalization but she recalls being told to take benadryl  when she went back to the pharmacy for help. Given more type 1 reaction described we recommended skin testing to further delineate. We are unable to offer this now as she is on histamine blockers.  - Would benefit from outpatient allergy/asthma referral after acute problems resolve.     PLAN: - Continue Ancef  + metronidazole .  - Follow results of abd CT scan  - Follow leukocytosis  - May need to consider chest CT if leukocytosis persistently elevated    Principal Problem:   Abscess of abdominal cavity (HCC) Active Problems:   Fibroid   DM2 (diabetes mellitus, type 2) (HCC)   CHF with  unknown LVEF (HCC)   Obesity, class 3    chewing gum (ORBIT) sugar free  1 Stick Oral QID   Chlorhexidine  Gluconate Cloth  6 each Topical Daily   enoxaparin  (LOVENOX ) injection  55 mg Subcutaneous Q24H   famotidine   20 mg Oral BID   feeding supplement  237 mL Oral BID BM   lactulose   20 g Oral BID   metroNIDAZOLE   500 mg Oral Q12H   pantoprazole   40 mg Oral Q1200   polyethylene glycol  17 g Oral QODAY   prenatal multivitamin  1 tablet Oral Q1200   simethicone   80 mg Oral TID   sodium chloride  flush  5 mL Intracatheter Q8H   spironolactone   25 mg Oral Daily   torsemide   40 mg Oral Daily    SUBJECTIVE: Feeling better. Off oxygen today. Eating as much as she can . No vomiting or GI effects from abx.    Review of Systems: Review of Systems  Constitutional:  Negative for chills, fever and malaise/fatigue.  Respiratory:  Positive for cough and shortness of breath.   Gastrointestinal:  Positive for abdominal pain. Negative for diarrhea, nausea and vomiting.  Musculoskeletal:  Negative for back pain, joint pain and neck pain.  Skin:  Negative for rash.  Neurological:  Negative for headaches.    Allergies  Allergen Reactions   Amoxicillin Hives, Itching and Swelling    Swollen eyes and throat.   Doxycycline  Hyclate Swelling    Bilateral  lower leg swelling   Penicillins Hives and Swelling    OBJECTIVE: Vitals:   12/14/23 0014 12/14/23 0443 12/14/23 0747 12/14/23 1157  BP: 122/69 122/74 126/67 128/83  Pulse: 86 77 84 79  Resp: 20 20 (!) 29 (!) 21  Temp: 99.7 F (37.6 C) 98.4 F (36.9 C) 99.2 F (37.3 C) 99.2 F (37.3 C)  TempSrc: Oral Oral Oral Oral  SpO2: 90% 99% 95% 93%  Weight:  109.8 kg    Height:       Body mass index is 40.27 kg/m.  Physical Exam Constitutional:      Appearance: She is well-developed. She is ill-appearing.  Cardiovascular:     Rate and Rhythm: Normal rate.  Abdominal:     General: Bowel sounds are normal. There is distension.      Tenderness: There is abdominal tenderness (around drain site).     Comments: Drain is mostly serous appearing,      Lab Results Lab Results  Component Value Date   WBC 18.2 (H) 12/14/2023   HGB 9.3 (L) 12/14/2023   HCT 30.4 (L) 12/14/2023   MCV 81.5 12/14/2023   PLT 528 (H) 12/14/2023    Lab Results  Component Value Date   CREATININE 0.66 12/14/2023   BUN 15 12/14/2023   NA 131 (L) 12/14/2023   K 3.6 12/14/2023   CL 87 (L) 12/14/2023   CO2 33 (H) 12/14/2023    Lab Results  Component Value Date   ALT 12 12/11/2023   AST 16 12/11/2023   ALKPHOS 53 12/11/2023   BILITOT 0.4 12/11/2023     Microbiology: Recent Results (from the past 240 hours)  Aerobic/Anaerobic Culture w Gram Stain (surgical/deep wound)     Status: None   Collection Time: 12/04/23  6:14 PM   Specimen: Wound; Body Fluid  Result Value Ref Range Status   Specimen Description WOUND  Final   Special Requests NONE  Final   Gram Stain   Final    RARE WBC PRESENT, PREDOMINANTLY PMN NO ORGANISMS SEEN    Culture   Final    FEW GROUP B STREP(S.AGALACTIAE)ISOLATED TESTING AGAINST S. AGALACTIAE NOT ROUTINELY PERFORMED DUE TO PREDICTABILITY OF AMP/PEN/VAN SUSCEPTIBILITY. NO ANAEROBES ISOLATED Performed at [facility], [address], [place], [place] [address]    Report Status 12/09/2023 FINAL  Final  Aerobic/Anaerobic Culture w Gram Stain (surgical/deep wound)     Status: None (Preliminary result)   Collection Time: 12/11/23  8:45 AM   Specimen: Abscess  Result Value Ref Range Status   Specimen Description ABSCESS  Final   Special Requests NONE  Final   Gram Stain NO WBC SEEN NO ORGANISMS SEEN   Final   Culture   Final    NO GROWTH 3 DAYS NO ANAEROBES ISOLATED; CULTURE IN PROGRESS FOR 5 DAYS Performed at [facility], [address], [place], [place] [address]    Report Status PENDING  Incomplete    Corean Fireman, MSN, NP-C [facility]  [email or web address] Pager: [phone] Office: [phone] [facility] Main Line: [phone] *Secure Chat Communication Welcome  Total Encounter Time: 25 m

## 2023-12-14 NOTE — Progress Notes (Signed)
 Physical Therapy Treatment Patient Details Name: Amanda Paul MRN: [id number] DOB: 12-28-1975 Today's Date: 12/14/2023   History of Present Illness Pt is 48 year old presented to [facility] on  12/04/23 for acute abd pain. Pt transferred to [facility] with uterine rupture of necrotic fibroid with chemical peritonitis. Pt underwent emergent total abdominal hysterectomy and bil salpingo-oopherectomy. PMH - uterine fibroids, htn, obesity,    PT Comments  Pt slowly progressing with goals. Continues to be limited with slow movements due to pain in the abdomen and at drain sites. Pt currently Mod I for most activities including bed mobility, sit to stand and gait. No AD this session through pt does require intermittent unilateral UE support for balance with short distance gait. Due to pt current functional status, home set up and available assistance at home recommending skilled physical therapy services 3x/week in order to address strength, balance and functional mobility to decrease risk for falls, injury and re-hospitalization.      If plan is discharge home, recommend the following: Assist for transportation;Assistance with cooking/housework;Help with stairs or ramp for entrance   Can travel by private vehicle     Yes  Equipment Recommendations  Rollator (4 wheels);BSC/3in1       Precautions / Restrictions Precautions Precautions: Fall;Other (comment) Recall of Precautions/Restrictions: Intact Precaution/Restrictions Comments: abd protection precs, watch O2, 2 JP drain Required Braces or Orthoses: Other Brace Restrictions Weight Bearing Restrictions Per Provider Order: No     Mobility  Bed Mobility Overal bed mobility: Modified Independent Bed Mobility: Supine to Sit Rolling: Modified independent (Device/Increase time) Sidelying to sit: Modified independent (Device/Increase time)       General bed mobility comments: Pt's bed was placed at 30 deg pt has wedge and husband pillow for  home to prop up. 1x cue to roll and then sit up    Transfers Overall transfer level: Modified independent Equipment used: None Transfers: Sit to/from Stand Sit to Stand: Modified independent (Device/Increase time)   Step pivot transfers: Modified independent (Device/Increase time)       General transfer comment: No assist needed to stand today. Good hand placement; very minimal increase in time due to pain. Steps from EOB to recliner with use of bed rail for stability    Ambulation/Gait Ambulation/Gait assistance: Modified independent (Device/Increase time) Gait Distance (Feet): 15 Feet Assistive device: Rolling walker (2 wheels) Gait Pattern/deviations: Step-through pattern, Decreased stride length Gait velocity: decreased Gait velocity interpretation: 1.31 - 2.62 ft/sec, indicative of limited community ambulator   General Gait Details: Pt ambulates slowly with use bed rails, walls for balance. No overt LOB   Stairs Stairs:  (pt declined. Demonstrates sufficient strength to navigate stairs per home set up of 2 steps. Will most likely need more assist if she has 6 steps. Min A max as demonstrated by sit to stand and gait)             Balance Overall balance assessment: Mild deficits observed, not formally tested Sitting-balance support: Feet supported Sitting balance-Leahy Scale: Good     Standing balance support: No upper extremity supported, Single extremity supported, During functional activity Standing balance-Leahy Scale: Fair Standing balance comment: pt with short distance ambulation with intermittent UE support      Communication Communication Communication: No apparent difficulties  Cognition Arousal: Alert Behavior During Therapy: WFL for tasks assessed/performed   PT - Cognitive impairments: No apparent impairments   Following commands: Intact      Cueing Cueing Techniques: Verbal cues, Gestural cues  General Comments General comments (skin  integrity, edema, etc.): No significant signs/symptoms of cardiac/respiratory distress. Pt on room air with O2 sats in 90's during activity      Pertinent Vitals/Pain Pain Assessment Pain Assessment: Faces Faces Pain Scale: Hurts little more Pain Location: abdomen with movement and R JP drain Pain Descriptors / Indicators: Guarding Pain Intervention(s): Monitored during session, Limited activity within patient's tolerance     PT Goals (current goals can now be found in the care plan section) Acute Rehab PT Goals Patient Stated Goal: To return to prior level of function PT Goal Formulation: With patient Time For Goal Achievement: 12/19/23 Potential to Achieve Goals: Good Progress towards PT goals: Progressing toward goals    Frequency    Min 2X/week      PT Plan  Continue with current POC        AM-PAC PT 6 Clicks Mobility   Outcome Measure  Help needed turning from your back to your side while in a flat bed without using bedrails?: None Help needed moving from lying on your back to sitting on the side of a flat bed without using bedrails?: None Help needed moving to and from a bed to a chair (including a wheelchair)?: None Help needed standing up from a chair using your arms (e.g., wheelchair or bedside chair)?: None Help needed to walk in hospital room?: None Help needed climbing 3-5 steps with a railing? : A Little 6 Click Score: 23    End of Session   Activity Tolerance: Patient tolerated treatment well Patient left: with call bell/phone within reach;in chair;Other (comment);with nursing/sitter in room (OT and nursing in room) Nurse Communication: Mobility status PT Visit Diagnosis: Unsteadiness on feet (R26.81);Other abnormalities of gait and mobility (R26.89);Muscle weakness (generalized) (M62.81);Pain Pain - part of body:  (abdomen)     Time: 8843-8775 PT Time Calculation (min) (ACUTE ONLY): 28 min  Charges:    $Therapeutic Activity: 23-37 mins PT  General Charges $$ ACUTE PT VISIT: 1 Visit                     Dorothyann Maier, DPT, CLT  Acute Rehabilitation Services Office: [phone] (Secure chat preferred)    Dorothyann VEAR Maier 12/14/2023, 12:37 PM

## 2023-12-14 NOTE — Assessment & Plan Note (Addendum)
 Glucose has been stable, at the time of discharge her fasting glucose is 109 mg/dl.   Severe hypoalbuminemia.   Mildly reduced TSH and mildly elevated free T4, patient with no hyperthyroid symptoms, will plan to repeat thyroid function test as outpatient.

## 2023-12-15 ENCOUNTER — Inpatient Hospital Stay (HOSPITAL_COMMUNITY)

## 2023-12-15 DIAGNOSIS — K651 Peritoneal abscess: Secondary | ICD-10-CM | POA: Diagnosis not present

## 2023-12-15 DIAGNOSIS — R609 Edema, unspecified: Secondary | ICD-10-CM

## 2023-12-15 DIAGNOSIS — E876 Hypokalemia: Secondary | ICD-10-CM | POA: Diagnosis not present

## 2023-12-15 DIAGNOSIS — I5033 Acute on chronic diastolic (congestive) heart failure: Secondary | ICD-10-CM | POA: Diagnosis not present

## 2023-12-15 DIAGNOSIS — E1169 Type 2 diabetes mellitus with other specified complication: Secondary | ICD-10-CM | POA: Diagnosis not present

## 2023-12-15 LAB — BASIC METABOLIC PANEL WITH GFR
Anion gap: 12 (ref 5–15)
BUN: 11 mg/dL (ref 6–20)
CO2: 32 mmol/L (ref 22–32)
Calcium: 8.4 mg/dL — ABNORMAL LOW (ref 8.9–10.3)
Chloride: 87 mmol/L — ABNORMAL LOW (ref 98–111)
Creatinine, Ser: 0.57 mg/dL (ref 0.44–1.00)
GFR, Estimated: >60 mL/min (ref >60)
Glucose, Bld: 104 mg/dL — ABNORMAL HIGH (ref 70–99)
Potassium: 3.4 mmol/L — ABNORMAL LOW (ref 3.5–5.1)
Sodium: 131 mmol/L — ABNORMAL LOW (ref 135–145)

## 2023-12-15 LAB — CBC
HCT: 30.7 % — ABNORMAL LOW (ref 36.0–46.0)
Hemoglobin: 9.4 g/dL — ABNORMAL LOW (ref 12.0–15.0)
MCH: 25.3 pg — ABNORMAL LOW (ref 26.0–34.0)
MCHC: 30.6 g/dL (ref 30.0–36.0)
MCV: 82.5 fL (ref 80.0–100.0)
Platelets: 627 K/uL — ABNORMAL HIGH (ref 150–400)
RBC: 3.72 MIL/uL — ABNORMAL LOW (ref 3.87–5.11)
RDW: 18.4 % — ABNORMAL HIGH (ref 11.5–15.5)
WBC: 16.3 K/uL — ABNORMAL HIGH (ref 4.0–10.5)
nRBC: 0.1 % (ref 0.0–0.2)

## 2023-12-15 LAB — MAGNESIUM: Magnesium: 2 mg/dL (ref 1.7–2.4)

## 2023-12-15 MED ORDER — POTASSIUM CHLORIDE CRYS ER 20 MEQ PO TBCR
40.0000 meq | EXTENDED_RELEASE_TABLET | ORAL | Status: AC
  Administered 2023-12-15 (×2): 40 meq via ORAL
  Filled 2023-12-15 (×2): qty 2

## 2023-12-15 MED ORDER — TECHNETIUM TO 99M ALBUMIN AGGREGATED
4.4000 | Freq: Once | INTRAVENOUS | Status: AC | PRN
  Administered 2023-12-15: 4.4 via INTRAVENOUS

## 2023-12-15 NOTE — Progress Notes (Signed)
 BLE venous exam is completed. Bronda Alfred, RVT

## 2023-12-15 NOTE — Progress Notes (Signed)
 Progress Note   Patient: Amanda Paul FMW:[id number] DOB: February 12, 1976 DOA: 12/04/2023     11 DOS: the patient was seen and examined on 12/15/2023   Brief hospital course: Consult for medical management for respiratory failure with hypoxemia.   48/F with obesity, hypertension, uterine fibroids was admitted to [facility] service 8/24 for hysterectomy for necrotic fibroid> chemical peritonitis, postop developed hypoxia, workup noted below for pleural effusions.  Chest radiograph with hypoinflation, right hemidiaphragm elevation, bilateral hilar vascular congestion.    EKG 143 bpm, normal axis, normal intervals, qtc 499, sinus rhythm with no significant ST segment or T wave changes. Low voltage.   Persistent leukocytosis, CT 8/29 with multiple intra-abdominal abscesses  IR consulted 8/31, drain placed in IR into right subdiaphragmatic fluid collection 09/04 improved volume status. Workup for pulmonary hypertension.   Assessment and Plan: * Abscess of abdominal cavity (HCC) 08/24 total abdominal hysterectomy, bilateral salpingo oopherectomy.  08/31 drain catheter placement into right subdiaphragmatic fluid collection.   Blood culture with no growth  Wound culture group B streptococcus (agalactiae)   09.03 CT abdomen and pelvis with right subdiaphragmatic fluid collection has only minimally decreased in size, pigtail catheter within this collection.  Small right pleural effusion with right lower lobe atelectasis.  Large amount of stool in the colon.  Wbc today 16,3 and afebrile.   Patient has been out of bed and improving po intake.  Post op abdominal pain is improving.  Continue bowel regimen.  Plan to continue ancef  and metronidazole   Acute on chronic diastolic (congestive) heart failure (HCC) Echocardiogram with preserved LV systolic function with EF 55%, D shaped interventricular septum, RV with mild reduced systolic function, RVSP 49.1 mmHg, RA and LA with normal size, no  significant valvular disease. Trivial pericardial effusion.   Echocardiogram from 07/2023 with some flattening D shape interventricular septum suggesting chronic pulmonary hypertension.   Acute on chronic cor pulmonale.  RV failure   CT chest with indeterminate examination for evaluation of the pulmonary arteries.   Volume status is negative since admission -13,979 ml  Urine output is 1200 ml  Systolic blood pressure 120 mmHg range.   Continue torsemide  40 mg po daily Spironolactone  25 mg po daily  Further work up with pulmonary V/Q scan and lower extremity ultrasonography.   Hypokalemia Hyponatremia.   Rena function with serum cr at 0,57 with K at 3,4 and serum bicarbonate at 32  Na 131  and Mg 2.0   Plan to continue diuresis with torsemide  and follow up renal function in am.  K correction with Kcl 40 meq x2   DM2 (diabetes mellitus, type 2) (HCC) Glucose has been stable.   Severe hypoalbuminemia.    Obesity, class 3 Calculated BMI is 40,2       Subjective: Patient with improvement in dyspnea and edema, continue very weak and deconditioned   Physical Exam: Vitals:   12/14/23 2343 12/15/23 0347 12/15/23 0730 12/15/23 1200  BP: 119/72 121/68 115/72 124/88  Pulse: 81 88 86 99  Resp: 20 20 14 17   Temp: 99 F (37.2 C) 99 F (37.2 C) 99 F (37.2 C) 98.2 F (36.8 C)  TempSrc: Oral Oral Oral Other (Comment)  SpO2: 96% 92% 93% 94%  Weight:      Height:       Neurology awake and alert ENT with mild pallor Cardiovascular with S1 and S2 present and regular with no gallops or rubs, no murmurs No JVD Respiratory with mild rales at bases with no wheezing or  rhonchi  Abdomen with no distention, non tender to superficial palpation, bilateral drains in place with serosanguinous fluid  No lower extremity edema   Data Reviewed:    Family Communication: no family at the bedside   Disposition: Status is: Inpatient Remains inpatient appropriate because: recovering  from sepsis and heart failure   Planned Discharge Destination: Home    Author: Elidia Toribio Furnace, MD 12/15/2023 3:06 PM  For on call review [email or web address].

## 2023-12-15 NOTE — Plan of Care (Signed)

## 2023-12-15 NOTE — Progress Notes (Signed)
 Gynecology Progress Note  Admission Date: 12/04/2023 Current Date: 12/15/2023 4:54 PM  Amanda Paul is a 48 y.o. G0P0000 HD#11  s/p TAH/BSO for necrotic fibroid and uterine rupture remote from [place] in 2022.   History complicated by: Patient Active Problem List   Diagnosis Date Noted   Hypokalemia 12/15/2023   Obesity, class 3 12/14/2023   Other ascites 12/10/2023   Abscess of abdominal cavity (HCC) 12/10/2023   Uterine leiomyoma 12/10/2023   DM2 (diabetes mellitus, type 2) (HCC) 12/06/2023   Sepsis (HCC) 12/06/2023   Acute hypoxic respiratory failure (HCC) 12/06/2023   Acute on chronic diastolic (congestive) heart failure (HCC) 12/06/2023   Pneumonia 12/06/2023   Fibroid 12/04/2023   Persistent cough 11/20/2023   Peripheral edema 11/20/2023   Chronic pain of both ankles 10/01/2023   Allergic rhinitis with postnasal drip 10/01/2023   Class 3 severe obesity due to excess calories with serious comorbidity and body mass index (BMI) of 45.0 to 49.9 in adult 03/21/2023   Pure hypercholesterolemia 03/21/2023   Coronary artery calcification 03/21/2023   Enlarged liver 03/21/2023   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   History of abnormal cervical Pap smear 07/31/2019   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   Hypertension 05/10/2012   Morbid obesity (HCC)     ROS and patient/family/surgical history, located on admission H&P note dated 12/04/2023, have been reviewed, and there are no changes except as noted below Yesterday/Overnight Events:  No new events  Subjective:  Appetite is fair and having BM, flatus, RUQ   Objective:   Vitals:   12/15/23 0347 12/15/23 0730 12/15/23 1200 12/15/23 1539  BP: 121/68 115/72 124/88 125/89  Pulse: 88 86 99 100  Resp: 20 14 17 19   Temp: 99 F (37.2 C) 99 F (37.2 C) 98.2 F (36.8 C) 99.8 F (37.7 C)  TempSrc: Oral Oral Other (Comment) Oral  SpO2: 92% 93% 94% 93%  Weight:      Height:        Temp:  [98.2 F (36.8 C)-99.8 F (37.7 C)]  99.8 F (37.7 C) (09/04 1539) Pulse Rate:  [81-100] 100 (09/04 1539) Resp:  [14-20] 19 (09/04 1539) BP: (115-125)/(68-89) 125/89 (09/04 1539) SpO2:  [92 %-96 %] 93 % (09/04 1539) I/O last 3 completed shifts: In: 480 [P.O.:480] Out: 1205 [Urine:1200; Drains:5] No intake/output data recorded.  Intake/Output Summary (Last 24 hours) at 12/15/2023 1654 Last data filed at 12/14/2023 2011 Gross per 24 hour  Intake --  Output 0 ml  Net 0 ml     Current Vital Signs 24h Vital Sign Ranges  T 99.8 F (37.7 C) Temp  Avg: 99 F (37.2 C)  Min: 98.2 F (36.8 C)  Max: 99.8 F (37.7 C)  BP 125/89 BP  Min: 115/72  Max: 125/89  HR 100 Pulse  Avg: 90.8  Min: 81  Max: 100  RR 19 Resp  Avg: 18.3  Min: 14  Max: 20  SaO2 93 % Room Air SpO2  Avg: 93.8 %  Min: 92 %  Max: 96 %       24 Hour I/O Current Shift I/O  Time Ins Outs 09/03 0701 - 09/04 0700 In: 240 [P.O.:240] Out: 1205 [Urine:1200; Drains:5] No intake/output data recorded.   Patient Vitals for the past 12 hrs:  BP Temp Temp src Pulse Resp SpO2  12/15/23 1539 125/89 99.8 F (37.7 C) Oral 100 19 93 %  12/15/23 1200 124/88 98.2 F (36.8 C) Other 99 17 94 %  12/15/23 0730 115/72 99 F (37.2  C) Oral 86 14 93 %     Patient Vitals for the past 24 hrs:  BP Temp Temp src Pulse Resp SpO2  12/15/23 1539 125/89 99.8 F (37.7 C) Oral 100 19 93 %  12/15/23 1200 124/88 98.2 F (36.8 C) Other 99 17 94 %  12/15/23 0730 115/72 99 F (37.2 C) Oral 86 14 93 %  12/15/23 0347 121/68 99 F (37.2 C) Oral 88 20 92 %  12/14/23 2343 119/72 99 F (37.2 C) Oral 81 20 96 %  12/14/23 2014 123/76 98.7 F (37.1 C) Oral 91 20 95 %    Physical exam: General appearance: alert, cooperative, and appears stated age Abdomen: soft, nontender, non-distended, c/d/i incision with staples in place, JP with small amount of serosanguinous output  GU: No gross VB Lungs: normal effort Heart: regular rate and rhythm Extremities: no lower extremity edema Skin:  warm Psych: appropriate Neurologic: Grossly normal  Medications Current Facility-Administered Medications  Medication Dose Route Frequency Provider Last Rate Last Admin   acetaminophen  (TYLENOL ) tablet 650 mg  650 mg Oral Q6H PRN Cleatus Moccasin, MD   650 mg at 12/14/23 1815   ceFAZolin  (ANCEF ) IVPB 2g/100 mL premix  2 g Intravenous Q8H Fleeta Rothman, Jomarie SAILOR, MD 200 mL/hr at 12/15/23 1411 2 g at 12/15/23 1411   chewing gum (ORBIT) sugar free  1 Stick Oral QID Cleatus Moccasin, MD   1 Stick at 12/15/23 1411   Chlorhexidine  Gluconate Cloth 2 % PADS 6 each  6 each Topical Daily Izell Harari, MD   6 each at 12/15/23 0932   diphenhydrAMINE  (BENADRYL ) injection 25 mg  25 mg Intravenous Q6H PRN Cleatus Moccasin, MD       enoxaparin  (LOVENOX ) injection 55 mg  55 mg Subcutaneous Q24H Jayne Vonn DEL, MD   55 mg at 12/15/23 9066   famotidine  (PEPCID ) tablet 20 mg  20 mg Oral BID Izell Harari, MD   20 mg at 12/15/23 0933   feeding supplement (ENSURE PLUS HIGH PROTEIN) liquid 237 mL  237 mL Oral BID BM Cleatus Moccasin, MD   237 mL at 12/15/23 1412   HYDROmorphone  (DILAUDID ) injection 0.5 mg  0.5 mg Intravenous Q4H PRN Patel, Ekta V, MD   0.5 mg at 12/14/23 2216   lactulose  (CHRONULAC ) 10 GM/15ML solution 20 g  20 g Oral BID Fairy Frames, MD   20 g at 12/15/23 9066   LORazepam  (ATIVAN ) tablet 0.5 mg  0.5 mg Oral Q4H PRN Lenon Marien CROME, MD       metroNIDAZOLE  (FLAGYL ) tablet 500 mg  500 mg Oral Q12H Fleeta Rothman, Jomarie SAILOR, MD   500 mg at 12/15/23 9066   ondansetron  (ZOFRAN ) tablet 4 mg  4 mg Oral Q6H PRN Cleatus Moccasin, MD       Or   ondansetron  (ZOFRAN ) injection 4 mg  4 mg Intravenous Q6H PRN Cleatus Moccasin, MD       oxyCODONE  (Oxy IR/ROXICODONE ) immediate release tablet 5-10 mg  5-10 mg Oral Q4H PRN Cleatus Moccasin, MD   10 mg at 12/15/23 0932   pantoprazole  (PROTONIX ) EC tablet 40 mg  40 mg Oral Q1200 Joseph, Preetha, MD   40 mg at 12/15/23 1223   polyethylene glycol (MIRALAX  / GLYCOLAX ) packet 17 g  17  g Oral QODAY Pickens, Charlie, MD   17 g at 12/14/23 9076   potassium chloride  SA (KLOR-CON  M) CR tablet 40 mEq  40 mEq Oral Q4H Arrien, Elidia Sieving, MD   40 mEq at  12/15/23 1222   prenatal multivitamin tablet 1 tablet  1 tablet Oral Q1200 Cleatus Moccasin, MD   1 tablet at 12/15/23 1222   simethicone  (MYLICON) chewable tablet 80 mg  80 mg Oral TID Izell Harari, MD   80 mg at 12/15/23 0933   sodium chloride  flush (NS) 0.9 % injection 5 mL  5 mL Intracatheter Q8H Mugweru, Jon, MD   5 mL at 12/15/23 1412   spironolactone  (ALDACTONE ) tablet 25 mg  25 mg Oral Daily Fairy Frames, MD   25 mg at 12/15/23 0933   torsemide  (DEMADEX ) tablet 40 mg  40 mg Oral Daily Fairy Frames, MD   40 mg at 12/15/23 0933      Labs  Recent Labs  Lab 12/13/23 0405 12/14/23 0335 12/15/23 0322  WBC 17.7* 18.2* 16.3*  HGB 9.5* 9.3* 9.4*  HCT 30.9* 30.4* 30.7*  PLT 522* 528* 627*    Recent Labs  Lab 12/09/23 0342 12/10/23 0346 12/11/23 0333 12/12/23 0500 12/13/23 0405 12/14/23 0335 12/15/23 0322  NA 139 137 135   < > 133* 131* 131*  K 3.8 3.5 3.7   < > 3.7 3.6 3.4*  CL 98 94* 91*   < > 87* 87* 87*  CO2 29 31 34*   < > 33* 33* 32  BUN 8 8 10    < > 12 15 11   CREATININE 0.45 0.61 0.66   < > 0.70 0.66 0.57  CALCIUM  8.9 8.7* 8.5*   < > 8.5* 8.4* 8.4*  PROT 6.2* 6.4* 6.8  --   --   --   --   BILITOT 0.6 0.5 0.4  --   --   --   --   ALKPHOS 50 52 53  --   --   --   --   ALT 14 17 12   --   --   --   --   AST 16 19 16   --   --   --   --   GLUCOSE 102* 110* 119*   < > 109* 101* 104*   < > = values in this interval not displayed.    Radiology Narrative & Impression  CLINICAL DATA:  Intraabdominal abscess. Aspiration on 08/31. Low output of JP drain.   EXAM: CT ABDOMEN AND PELVIS WITH CONTRAST   TECHNIQUE: Multidetector CT imaging of the abdomen and pelvis was performed using the standard protocol following bolus administration of intravenous contrast.   RADIATION DOSE REDUCTION: This  exam was performed according to the departmental dose-optimization program which includes automated exposure control, adjustment of the mA and/or kV according to patient size and/or use of iterative reconstruction technique.   CONTRAST:  75mL OMNIPAQUE  IOHEXOL  350 MG/ML SOLN   COMPARISON:  CT abdomen and pelvis 12/09/2023.   FINDINGS: Lower chest: There is a small right pleural effusion with right lower lobe atelectasis/airspace disease.   Hepatobiliary: There is a subcentimeter fatty lesion in the right lobe of the liver, likely lipoma. This is unchanged. No new liver lesions are seen. Gallbladder and bile ducts are within normal limits.   Pancreas: Unremarkable. No pancreatic ductal dilatation or surrounding inflammatory changes.   Spleen: Normal in size without focal abnormality.   Adrenals/Urinary Tract: There is a small focus of air in the bladder. The bladder is otherwise within normal limits. The kidneys and adrenal glands are within normal limits.   Stomach/Bowel: Stomach is within normal limits. The appendix is not seen. There is a large amount of stool  throughout the entire colon. There is descending and sigmoid colon diverticulosis. No evidence of bowel wall thickening, distention, or inflammatory changes.   Vascular/Lymphatic: No significant vascular findings are present. No enlarged abdominal or pelvic lymph nodes.   Reproductive: Status post hysterectomy. No adnexal masses.   Other: Right subdiaphragmatic fluid collection is again seen. This is only minimally decreased in size when compared to the prior study. There is a new percutaneous pigtail catheter within this collection. Midline abdominal wound and skin staples again noted. There is no Venable fluid collection in the abdominal wall. There is a small supraumbilical midline ventral hernia containing mesenteric fat and nondilated bowel similar to the prior study. There is no ascites or free air.    Musculoskeletal: Degenerative changes affect the spine.   IMPRESSION: 1. Right subdiaphragmatic fluid collection has only minimally decreased in size when compared to the prior study despite new percutaneous pigtail catheter within this collection. 2. Small right pleural effusion with right lower lobe atelectasis/airspace disease. 3. Small focus of air in the bladder. 4. Stable small supraumbilical ventral hernia containing mesenteric fat and nondilated bowel. 5. Stable large amount of stool throughout the colon.     Electronically Signed   By: Greig Pique M.D.   On: 12/14/2023 23:55    Assessment & Plan:   *GYN: - Meeting surgical milestones - JP drain continuing with output of serosanguinous fluid  - Path benign. Fluid collection RUQ is stable *  IR to f/u re disposition of drain - Encourage ambulation - she is working with PT/OT.  Code Status: Full Code  Total time taking care of the patient was 15 minutes, with greater than 50% of the time spent in face to face interaction with the patient.  Eveline Lynwood MATSU, MD  Attending Center for [facility] (Faculty Practice)  Patient ID: [name], female   DOB: 1976-04-01, 48 y.o.   MRN: [id number]

## 2023-12-15 NOTE — Assessment & Plan Note (Addendum)
 Hyponatremia.   At the time of discharge her renal function is stable with serum cr at 0,60 with K at 3,9 and serum bicarbonate at 32  Na 133 and P 2.9 Mg 2.0   Plan to continue diuresis with torsemide  and spironolactone  Will give 40 meq po x1 today, and plan to follow up renal function and electrolytes as outpatient within 7 days.

## 2023-12-16 ENCOUNTER — Telehealth: Admitting: Cardiology

## 2023-12-16 ENCOUNTER — Encounter (HOSPITAL_COMMUNITY)

## 2023-12-16 ENCOUNTER — Encounter: Payer: Self-pay | Admitting: Cardiology

## 2023-12-16 DIAGNOSIS — E1169 Type 2 diabetes mellitus with other specified complication: Secondary | ICD-10-CM | POA: Diagnosis not present

## 2023-12-16 DIAGNOSIS — I5033 Acute on chronic diastolic (congestive) heart failure: Secondary | ICD-10-CM | POA: Diagnosis not present

## 2023-12-16 DIAGNOSIS — E876 Hypokalemia: Secondary | ICD-10-CM | POA: Diagnosis not present

## 2023-12-16 DIAGNOSIS — K651 Peritoneal abscess: Secondary | ICD-10-CM | POA: Diagnosis not present

## 2023-12-16 LAB — AEROBIC/ANAEROBIC CULTURE W GRAM STAIN (SURGICAL/DEEP WOUND)
Culture: NO GROWTH
Gram Stain: NONE SEEN

## 2023-12-16 LAB — CBC
HCT: 31.2 % — ABNORMAL LOW (ref 36.0–46.0)
Hemoglobin: 9.5 g/dL — ABNORMAL LOW (ref 12.0–15.0)
MCH: 25.1 pg — ABNORMAL LOW (ref 26.0–34.0)
MCHC: 30.4 g/dL (ref 30.0–36.0)
MCV: 82.3 fL (ref 80.0–100.0)
Platelets: 665 K/uL — ABNORMAL HIGH (ref 150–400)
RBC: 3.79 MIL/uL — ABNORMAL LOW (ref 3.87–5.11)
RDW: 18.6 % — ABNORMAL HIGH (ref 11.5–15.5)
WBC: 16.7 K/uL — ABNORMAL HIGH (ref 4.0–10.5)
nRBC: 0 % (ref 0.0–0.2)

## 2023-12-16 LAB — BASIC METABOLIC PANEL WITH GFR
Anion gap: 14 (ref 5–15)
BUN: 14 mg/dL (ref 6–20)
CO2: 32 mmol/L (ref 22–32)
Calcium: 8.7 mg/dL — ABNORMAL LOW (ref 8.9–10.3)
Chloride: 89 mmol/L — ABNORMAL LOW (ref 98–111)
Creatinine, Ser: 0.68 mg/dL (ref 0.44–1.00)
GFR, Estimated: >60 mL/min (ref >60)
Glucose, Bld: 107 mg/dL — ABNORMAL HIGH (ref 70–99)
Potassium: 3.7 mmol/L (ref 3.5–5.1)
Sodium: 135 mmol/L (ref 135–145)

## 2023-12-16 MED ORDER — LEVOFLOXACIN 750 MG PO TABS
750.0000 mg | ORAL_TABLET | Freq: Every day | ORAL | Status: DC
  Administered 2023-12-16 – 2023-12-17 (×2): 750 mg via ORAL
  Filled 2023-12-16 (×2): qty 1

## 2023-12-16 MED ORDER — POTASSIUM CHLORIDE CRYS ER 20 MEQ PO TBCR
40.0000 meq | EXTENDED_RELEASE_TABLET | Freq: Once | ORAL | Status: AC
  Administered 2023-12-16: 40 meq via ORAL
  Filled 2023-12-16: qty 2

## 2023-12-16 NOTE — Progress Notes (Signed)
 Occupational Therapy Treatment & Discharge Note Patient Details Name: Amanda Paul MRN: [id number] DOB: [birth year] Today's Date: 12/16/2023   History of present illness Pt is 48 year old presented to [facility] on  12/04/23 for acute abd pain. Pt transferred to [facility] with uterine rupture of necrotic fibroid with chemical peritonitis. Pt underwent emergent total abdominal hysterectomy and bil salpingo-oopherectomy. PMH - uterine fibroids, htn, obesity,   OT comments  Pt progressed to meet and complete all OT goals. Pt demo'd improved RUE ROM even with pain able to complete UB dressing with mod I. Improved BLE ROM and tolerance, pt able to figure 4 legs from recliner for socks, stand and pull simulated pants above waist  at mod I in standing. Functional transfers completed with Mod I. Reviewed compensatory strategies for ADLs if pain becomes limiting. All goals met and education complete no further acute OT needs. Pt lives alone and would benefit from [facility] services to improve independence with IADLs and activity tolerance.      If plan is discharge home, recommend the following:  Assistance with cooking/housework;Assist for transportation   Equipment Recommendations  Tub/shower bench       Precautions / Restrictions Precautions Precautions: Other (comment) Recall of Precautions/Restrictions: Intact Precaution/Restrictions Comments: abd protection precs, 2 JP drain Required Braces or Orthoses: Other Brace Restrictions Weight Bearing Restrictions Per Provider Order: No       Mobility Bed Mobility   General bed mobility comments: Pt sitting up in recliner at beginning/end of session    Transfers Overall transfer level: Modified independent Equipment used: None Transfers: Sit to/from Stand, Bed to chair/wheelchair/BSC Sit to Stand: Modified independent (Device/Increase time)           General transfer comment: No assist, increased time d/t pain     Balance Overall balance  assessment: No apparent balance deficits (not formally assessed) Sitting-balance support: Feet supported Sitting balance-Leahy Scale: Good     Standing balance support: No upper extremity supported Standing balance-Leahy Scale: Good Standing balance comment: Able to reach outside BOS         ADL either performed or assessed with clinical judgement   ADL Overall ADL's : Modified independent     Upper Body Dressing : Modified independent   Lower Body Dressing: Modified independent   Toilet Transfer: Modified Independent   Toileting- Clothing Manipulation and Hygiene: Modified independent         General ADL Comments: Pt met OT goals today with session able to simulate UB/LB dressing. Pt mobillizing to bathroom with mod I use of RW without staff during day. Reviewed compensatory strategies for safety upon d/c    Extremity/Trunk Assessment Upper Extremity Assessment Upper Extremity Assessment: Overall WFL for tasks assessed   Lower Extremity Assessment Lower Extremity Assessment: Defer to PT evaluation        Vision   Vision Assessment?: No apparent visual deficits         Communication Communication Communication: No apparent difficulties   Cognition Arousal: Alert Behavior During Therapy: WFL for tasks assessed/performed Cognition: No apparent impairments     Following commands: Intact        Cueing   Cueing Techniques: Verbal cues, Gestural cues        General Comments VSS on RA    Pertinent Vitals/ Pain       Pain Assessment Pain Assessment: Faces Faces Pain Scale: Hurts a little bit Pain Location: abdomen with movement and R JP drain Pain Descriptors / Indicators: Guarding, Grimacing Pain Intervention(s):  Monitored during session   Frequency  Min 2X/week        Progress Toward Goals  OT Goals(current goals can now be found in the care plan section)  Progress towards OT goals: Goals met/education completed, patient discharged from  OT  Acute Rehab OT Goals Patient Stated Goal: To go home OT Goal Formulation: With patient Time For Goal Achievement: 12/20/23 Potential to Achieve Goals: Good ADL Goals Pt Will Perform Upper Body Dressing: with modified independence Pt Will Perform Lower Body Dressing: with modified independence;with adaptive equipment;sit to/from stand Pt Will Transfer to Toilet: with modified independence Pt Will Perform Toileting - Clothing Manipulation and hygiene: with modified independence  Plan         AM-PAC OT 6 Clicks Daily Activity     Outcome Measure   Help from another person eating meals?: None Help from another person taking care of personal grooming?: None Help from another person toileting, which includes using toliet, bedpan, or urinal?: None Help from another person bathing (including washing, rinsing, drying)?: None Help from another person to put on and taking off regular upper body clothing?: None Help from another person to put on and taking off regular lower body clothing?: None 6 Click Score: 24    End of Session    OT Visit Diagnosis: Unsteadiness on feet (R26.81);Other abnormalities of gait and mobility (R26.89);Pain   Activity Tolerance Patient tolerated treatment well   Patient Left in chair;with call bell/phone within reach   Nurse Communication Mobility status        Time: 8566-8556 OT Time Calculation (min): 10 min  Charges: OT General Charges $OT Visit: 1 Visit OT Treatments $Self Care/Home Management : 8-22 mins  Adrianne BROCKS, OT  Acute Rehabilitation Services Office [phone] Secure chat preferred   Adrianne GORMAN Savers 12/16/2023, 4:13 PM

## 2023-12-16 NOTE — Progress Notes (Signed)
 Gynecology Progress Note    Patient ID: Amanda Paul, female   DOB: 08/17/1975, 48 y.o.   MRN: [id number]  Admission Date: 12/04/2023 Current Date: 12/16/2023 6:33 PM  Amanda Paul is a 48 y.o. G0P0000 POD13 s/p TAH/BSO for necrotic fibroid and uterine rupture remote from [place] in 2022.   Yesterday/Overnight Events:  No new events.  Subjective:  Improving. Was up in the bathroom independently. Pain is controlled with PO medications. She is ambulatory. No O2 requirement. She is voiding and passing flatus.   Objective:    Current Vital Signs 24h Vital Sign Ranges  T 98.5 F (36.9 C) Temp  Avg: 98.8 F (37.1 C)  Min: 98.5 F (36.9 C)  Max: 99.5 F (37.5 C)  BP 125/80 BP  Min: 117/70  Max: 125/80  HR 87 Pulse  Avg: 86.2  Min: 81  Max: 94  RR 20 Resp  Avg: 20  Min: 20  Max: 20  SaO2 97 % Room Air SpO2  Avg: 93.8 %  Min: 91 %  Max: 97 %       24 Hour I/O Current Shift I/O  Time Ins Outs 09/04 0701 - 09/05 0700 In: 10 [I.V.:10] Out: 8 [Drains:8] No intake/output data recorded.    New drain put out 326 cc thus far Patient Vitals for the past 24 hrs:  BP Temp Temp src Pulse Resp SpO2  12/16/23 1201 125/80 -- Oral 87 20 97 %  12/16/23 0937 121/80 98.5 F (36.9 C) Oral 86 20 96 %  12/16/23 0358 117/70 98.8 F (37.1 C) Oral 83 20 91 %  12/15/23 2340 118/78 98.5 F (36.9 C) Oral 81 20 --  12/15/23 2101 120/73 99.5 F (37.5 C) Oral 94 20 91 %    Physical exam: General appearance: A&Ox3, appears comfortable sitting in chair Abdomen: soft, nontender, non-distended; incision with staples in place, LLQ and RUQ drains with small amount of SS drainage  Lungs: Normal respiratory effort on room air Heart: regular rate Extremities: LE edema stable  Labs  Recent Labs  Lab 12/14/23 0335 12/15/23 0322 12/16/23 0305  WBC 18.2* 16.3* 16.7*  HGB 9.3* 9.4* 9.5*  HCT 30.4* 30.7* 31.2*  PLT 528* 627* 665*    Recent Labs  Lab 12/10/23 0346 12/11/23 0333  12/12/23 0500 12/14/23 0335 12/15/23 0322 12/16/23 0305  NA 137 135   < > 131* 131* 135  K 3.5 3.7   < > 3.6 3.4* 3.7  CL 94* 91*   < > 87* 87* 89*  CO2 31 34*   < > 33* 32 32  BUN 8 10   < > 15 11 14   CREATININE 0.61 0.66   < > 0.66 0.57 0.68  CALCIUM  8.7* 8.5*   < > 8.4* 8.4* 8.7*  PROT 6.4* 6.8  --   --   --   --   BILITOT 0.5 0.4  --   --   --   --   ALKPHOS 52 53  --   --   --   --   ALT 17 12  --   --   --   --   AST 19 16  --   --   --   --   GLUCOSE 110* 119*   < > 101* 104* 107*   < > = values in this interval not displayed.   No results found.  Assessment & Plan:  48 y.o. G0 POD13 from TAH/BSO for necrotic fibroid and  uterine rupture remote from [place] in 2022. Clinical status is improving, anticipate discharge home tomorrow (9/6).  *GYN:  - Meeting surgical milestones - JP drain continuing with output of serosanguinous fluid  - Path benign - Plan for removal of staples & LLQ drain tomorrow prior to discharge home - F/u in 2 weeks for post op care  *ID: - WBC stable at 16, remains afebrile - CT ordered to evaluate right subdiaphragmatic fluid collection s/p aspiration on 8/31 given low outout of JP drain. This showed slight improvement in the collection. IR will keep drain in place. If discharged as planned tomorrow, she will get repeat imaging with/without removal as an outpatient - ID signed off; plan for PO levaquin /flagyl  x 3 weeks with outpatient follow up on 9/18  *Heme: - Hgb stable at this time. - PO iron  on discharge  *CV/Resp: as per hospitalist team, appreciate recommendations. Now weaned to room air and ready for discharge tomorrow. Has cardiology follow up scheduled 9/10  * T2DM: as per hospitalist team  *Pain:  - Well controlled - pain meds prn  *FEN/GI:  - Diet as tolerated.  - Encouraged continued high protein diet. Continue Ensure.   - Chewing gum. PPI.  - Hypokalemia - resolved s/p repletion.   *PPx:  - ppx Lovenox . Continue SCDs.  -  Encourage ambulation   *Dispo: anticipate discharge home tomorrow. PT/OT have signed off and recommend HHPT/OT 3x/wk which is being arranged by case management. Recommended DME has been ordered.   Code Status: Full Code  Kieth JAYSON Carolin, MD [phone] 12/16/2023 6:33 PM

## 2023-12-16 NOTE — Progress Notes (Signed)
 Progress Note   Patient: Amanda Paul FMW:[id number] DOB: Jun 05, 1975 DOA: 12/04/2023     12 DOS: the patient was seen and examined on 12/16/2023   Brief hospital course: Consult for medical management for respiratory failure with hypoxemia.   48/F with obesity, hypertension, uterine fibroids was admitted to [facility] service 8/24 for hysterectomy for necrotic fibroid> chemical peritonitis, postop developed hypoxia, workup noted below for pleural effusions.  Chest radiograph with hypoinflation, right hemidiaphragm elevation, bilateral hilar vascular congestion.    EKG 143 bpm, normal axis, normal intervals, qtc 499, sinus rhythm with no significant ST segment or T wave changes. Low voltage.   Persistent leukocytosis, CT 8/29 with multiple intra-abdominal abscesses  IR consulted 8/31, drain placed in IR into right subdiaphragmatic fluid collection 09/04 improved volume status. Workup for pulmonary hypertension.  09/05 transition to oral antibiotic therapy.   Assessment and Plan: * Abscess of abdominal cavity (HCC) 08/24 total abdominal hysterectomy, bilateral salpingo oopherectomy.  08/31 drain catheter placement into right subdiaphragmatic fluid collection.   Blood culture with no growth  Wound culture group B streptococcus (agalactiae)   09.03 CT abdomen and pelvis with right subdiaphragmatic fluid collection has only minimally decreased in size, pigtail catheter within this collection.  Small right pleural effusion with right lower lobe atelectasis.  Large amount of stool in the colon.  Wbc today 16,3 and afebrile.   Patient has been out of bed and improving po intake.  Post op abdominal pain is improving.  Continue bowel regimen.   Transition to po antibiotic therapy with levofloxacin  and metronidazole  for 3 weeks.  Outpatient follow up with ID and IR.   Acute on chronic diastolic (congestive) heart failure (HCC) Echocardiogram with preserved LV systolic function with  EF 55%, D shaped interventricular septum, RV with mild reduced systolic function, RVSP 49.1 mmHg, RA and LA with normal size, no significant valvular disease. Trivial pericardial effusion.   Echocardiogram from 07/2023 with some flattening D shape interventricular septum suggesting chronic pulmonary hypertension.   Acute on chronic cor pulmonale.  RV failure   CT chest with indeterminate examination for evaluation of the pulmonary arteries.   Volume status is negative since  Systolic blood pressure 120 mmHg range.   Continue torsemide  40 mg po daily Spironolactone  25 mg po daily  V/Q scan negative for pulmonary embolism and lower extremity doppler ultrasound negative for deep vein thrombosis.   Hypokalemia Hyponatremia.   Renal function stable with serum cr at 0,68 with K at 3,7 and serum bicarbonate at 32  Na 135   Plan to continue diuresis with torsemide  and spironolactone  Add 40 meq kcl today to prevent hypokalemia   DM2 (diabetes mellitus, type 2) (HCC) Glucose has been stable.   Severe hypoalbuminemia.    Obesity, class 3 Calculated BMI is 40,2         Subjective: Patient with improvement in dyspnea, no chest pain, no PND or orthopnea   Physical Exam: Vitals:   12/15/23 2340 12/16/23 0358 12/16/23 0937 12/16/23 1201  BP: 118/78 117/70 121/80 125/80  Pulse: 81 83 86 87  Resp: 20 20 20 20   Temp: 98.5 F (36.9 C) 98.8 F (37.1 C) 98.5 F (36.9 C)   TempSrc: Oral Oral Oral Oral  SpO2:  91% 96% 97%  Weight:      Height:       Neurology awake and alert ENT with mild pallor with no icterus Cardiovascular with S1 and S2 present and regular with no gallops or rubs, positive systolic  murmur at the right lower sternal border No JVD Respiratory with no rales or wheezing, no rhonchi  Abdomen with no distention, drains in place  No lower extremity edema  Data Reviewed:    Family Communication: no family at the bedside   Disposition: Status is:  Inpatient Remains inpatient appropriate because: recovering from abdominal infection   Planned Discharge Destination: Home      Author: Elidia Toribio Furnace, MD 12/16/2023 5:30 PM  For on call review [email or web address].

## 2023-12-16 NOTE — Progress Notes (Addendum)
 Interventional Radiology Brief Note:  Amanda Paul is a 48 year old female POD#1 from TAH/BSO for uterine rupture with necrosis and peritonitis s/p RUQ collection requiring IR aspiration and drainage 12/11/23.  Drain output has been slowly improving, now </= 10 mL for several days.  Cultures were negative.  Repeat imaging shows persistent, but improving fluid collection.   Discussed imaging with Dr. Hughes who notes improvement, both clinically as well as by imaging. Although it is noted her WBC remains elevated at 16.7. Plan made to repeat CT Abd with contrast for final evaluation of collection on day of discharge or within the next week if discharged over the weekend for possible drain removal.  No fluoro injection needed.  Orders have been placed for repeat CT Abdomen w/ on Monday, however in the event patient discharges home over the weekend will plan for repeat imaging and possible pull in outpatient IR clinic next week. Managing service aware.   IR remains available.  Continue current management with routine drain care for now.   Amanda Murfin, MS RD PA-C

## 2023-12-16 NOTE — Progress Notes (Addendum)
 [facility]  Date of Admission:  12/04/2023      Total days of antibiotics 10        ASSESSMENT: Amanda Paul is a 48 y.o. female with   Sepsis, intra-abdominal abscesses Necrotic uterine fibroid, uterine rupture, secondary peritonitis - Group B Streptococcus -  S/P TAH/BSO 8/24 -  Drain output appears decreased / minimal. Will need repeated imaging prior to further decisions on duration of antibiotics - to be done today. Pathology is benign. She feels much improved and holding down medications. I think we can switch to oral Levaquin  + metronidazole  if her CT scan comes back reassuring for adequate drainage.  - switch to levaquin  and metronidazole   - will arrange for 2 week follow up to see what's going on with drain and fluid collection as she will be followed up with IR drain clinic  Pleural Effusion / Pneumonia -  Oxygen is off today. No significant cough or SOB.  Received 5 days of broader coverage with cefepime /vancomycin .  If WBC remains elevated and back on supplemental O2 may need to consider chest imaging. CHF/ fluid overload and hypoalbuminemia have also contributed to this picture.   PCN Allergy Hx -  She describes a fairly immediate reaction to a derivative of amoxicillin 20 years ago. This did not result in hospitalization but she recalls being told to take benadryl  when she went back to the pharmacy for help. Given more type 1 reaction described we recommended skin testing to further delineate. We are unable to offer this now as she is on histamine blockers.  - Would benefit from outpatient allergy/asthma referral after acute problems resolve.   ID will sign off - please call back with any questions/concerns or if we can be of further assistance.    PLAN: Change to levaquin  and metronidazole   Will send 3 weeks to [facility] pharmacy  9/18 @ 3:15 FU with Dr. Overton arranged  --------------- I personally performed substantive  portion of this visit, including history, exam, labs/imaging review, and decision making. Total time on the calendar day of the encounter (mine and independent time of the App) including pre and post visit work was 50 minutes  Patient doing well Planning ct repeat prior to any drain management per IR Wbc slowly down but moderately high still No abx side effect  Exam: In chair, no distress Abd s/nt Drain serosanguinous output Neuro nonfocal  Labs reviewed:  A/p  Intraabd abscess Pcn allergy  -plan 3 weeks abx as mentioned -id clinic f/u and will see if need to prolong abx -maintain standard isolation precaution -will sign off   Principal Problem:   Abscess of abdominal cavity (HCC) Active Problems:   DM2 (diabetes mellitus, type 2) (HCC)   Acute on chronic diastolic (congestive) heart failure (HCC)   Obesity, class 3   Hypokalemia    chewing gum (ORBIT) sugar free  1 Stick Oral QID   Chlorhexidine  Gluconate Cloth  6 each Topical Daily   enoxaparin  (LOVENOX ) injection  55 mg Subcutaneous Q24H   famotidine   20 mg Oral BID   feeding supplement  237 mL Oral BID BM   lactulose   20 g Oral BID   levofloxacin   750 mg Oral Daily   metroNIDAZOLE   500 mg Oral Q12H   pantoprazole   40 mg Oral Q1200   polyethylene glycol  17 g Oral QODAY   prenatal multivitamin  1 tablet Oral Q1200   simethicone   80 mg Oral TID   sodium chloride  flush  5 mL Intracatheter Q8H   spironolactone   25 mg Oral Daily   torsemide   40 mg Oral Daily    SUBJECTIVE: Feeling significantly better.    Review of Systems: Review of Systems  Constitutional:  Negative for chills, fever and malaise/fatigue.  Respiratory:  Negative for cough and shortness of breath.   Gastrointestinal:  Positive for abdominal pain. Negative for diarrhea, nausea and vomiting.  Musculoskeletal:  Negative for back pain, joint pain and neck pain.  Skin:  Negative for rash.  Neurological:  Negative for headaches.    Allergies   Allergen Reactions   Amoxicillin Hives, Itching and Swelling    Swollen eyes and throat.   Doxycycline  Hyclate Swelling    Bilateral lower leg swelling   Penicillins Hives and Swelling    OBJECTIVE: Vitals:   12/15/23 2340 12/16/23 0358 12/16/23 0937 12/16/23 1201  BP: 118/78 117/70 121/80 125/80  Pulse: 81 83 86 87  Resp: 20 20 20 20   Temp: 98.5 F (36.9 C) 98.8 F (37.1 C) 98.5 F (36.9 C)   TempSrc: Oral Oral Oral Oral  SpO2:  91% 96% 97%  Weight:      Height:       Body mass index is 40.27 kg/m.  Physical Exam Constitutional:      Appearance: She is well-developed. She is ill-appearing.  Cardiovascular:     Rate and Rhythm: Normal rate.  Abdominal:     General: Bowel sounds are normal. There is distension.     Tenderness: There is abdominal tenderness (around drain site).     Comments: Drain is mostly serous appearing,      Lab Results Lab Results  Component Value Date   WBC 16.7 (H) 12/16/2023   HGB 9.5 (L) 12/16/2023   HCT 31.2 (L) 12/16/2023   MCV 82.3 12/16/2023   PLT 665 (H) 12/16/2023    Lab Results  Component Value Date   CREATININE 0.68 12/16/2023   BUN 14 12/16/2023   NA 135 12/16/2023   K 3.7 12/16/2023   CL 89 (L) 12/16/2023   CO2 32 12/16/2023    Lab Results  Component Value Date   ALT 12 12/11/2023   AST 16 12/11/2023   ALKPHOS 53 12/11/2023   BILITOT 0.4 12/11/2023     Microbiology: Recent Results (from the past 240 hours)  Aerobic/Anaerobic Culture w Gram Stain (surgical/deep wound)     Status: None (Preliminary result)   Collection Time: 12/11/23  8:45 AM   Specimen: Abscess  Result Value Ref Range Status   Specimen Description ABSCESS  Final   Special Requests NONE  Final   Gram Stain NO WBC SEEN NO ORGANISMS SEEN   Final   Culture   Final    NO GROWTH 4 DAYS NO ANAEROBES ISOLATED; CULTURE IN PROGRESS FOR 5 DAYS Performed at [facility], [address] [place], [place] [address]    Report Status PENDING   Incomplete    Corean Fireman, MSN, NP-C [facility]  [email or web address] Pager: [phone] Office: [phone] [facility] Main Line: [phone] *Secure Chat Communication Welcome  Total Encounter Time: 25 m

## 2023-12-16 NOTE — TOC Initial Note (Signed)
 Transition of Care (TOC) - Initial/Assessment Note  Rayfield Gobble RN, BSN Inpatient Care Management Unit 4E- RN Case Manager See Treatment Team for direct phone #   Patient Details  Name: Amanda Paul MRN: [id number] Date of Birth: February 05, 1976  Transition of Care [facility]) CM/SW Contact:    Gobble Rayfield Hurst, RN Phone Number: 12/16/2023, 4:37 PM  Clinical Narrative:                 Received call from MD that pt may be medically ready for transition home over weekend. HH and DME orders have been placed.   Per previous CM note- HH referral has been sent to [facility]- call made to [facility] liaison to confirm referral has been accepted- spoke with Adriana- who will pull orders and follow for discharge and start of care.   DME referral sent to [facility]- per liaison they will have to verify that they are in-network with insurance- DME referral pending with [facility] for tub bench and rollator - if confirmed they can take insurance- will deliver to pt prior to discharge.   Expected Discharge Plan: Home w Home Health Services Barriers to Discharge: Continued Medical Work up   Patient Goals and CMS Choice Patient states their goals for this hospitalization and ongoing recovery are:: return home   Choice offered to / list presented to : Patient      Expected Discharge Plan and Services In-house Referral: Clinical Social Work Discharge Planning Services: CM Consult Post Acute Care Choice: Home Health, Durable Medical Equipment Living arrangements for the past 2 months: Apartment                 DME Arranged: Walker rolling with seat, Tub bench DME Agency: [facility] Date DME Agency Contacted: 12/16/23 Time DME Agency Contacted: [phone] Representative spoke with at DME Agency: Zack HH Arranged: PT, OT HH Agency: [facility] Date [facility] Agency Contacted: 12/16/23 Time HH Agency Contacted: [phone] Representative spoke with at [facility] Agency: Amy/Rodney  Prior Living  Arrangements/Services Living arrangements for the past 2 months: Apartment Lives with:: Self Patient language and need for interpreter reviewed:: Yes Do you feel safe going back to the place where you live?: Yes      Need for Family Participation in Patient Care: Yes (Comment)     Criminal Activity/Legal Involvement Pertinent to Current Situation/Hospitalization: No - Comment as needed  Activities of Daily Living   ADL Screening (condition at time of admission) Independently performs ADLs?: Yes (appropriate for developmental age) Is the patient deaf or have difficulty hearing?: No Does the patient have difficulty seeing, even when wearing glasses/contacts?: No Does the patient have difficulty concentrating, remembering, or making decisions?: No  Permission Sought/Granted Permission sought to share information with : Facility Industrial/product designer granted to share information with : Yes, Verbal Permission Granted     Permission granted to share info w AGENCY: HH        Emotional Assessment Appearance:: Appears stated age     Orientation: : Oriented to Self, Oriented to Place, Oriented to  Time, Oriented to Situation Alcohol / Substance Use: Not Applicable Psych Involvement: No (comment)  Admission diagnosis:  Fibroid [D21.9] Other ascites [R18.8] Uterine leiomyoma, unspecified location [D25.9] Patient Active Problem List   Diagnosis Date Noted   Hypokalemia 12/15/2023   Obesity, class 3 12/14/2023   Other ascites 12/10/2023   Abscess of abdominal cavity (HCC) 12/10/2023   Uterine leiomyoma 12/10/2023   DM2 (diabetes mellitus, type 2) (HCC) 12/06/2023  Sepsis (HCC) 12/06/2023   Acute hypoxic respiratory failure (HCC) 12/06/2023   Acute on chronic diastolic (congestive) heart failure (HCC) 12/06/2023   Pneumonia 12/06/2023   Fibroid 12/04/2023   Persistent cough 11/20/2023   Peripheral edema 11/20/2023   Chronic pain of both ankles 10/01/2023   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   Class 3 severe obesity due to excess calories with serious comorbidity and body mass index (BMI) of 45.0 to 49.9 in adult 03/21/2023   Pure hypercholesterolemia 03/21/2023   Coronary artery calcification 03/21/2023   Enlarged liver 03/21/2023   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   History of abnormal cervical Pap smear 07/31/2019   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   Hypertension 05/10/2012   Morbid obesity (HCC)    PCP:  Jarold Medici, MD Pharmacy:   [facility] [address], [place] - [address] [place] [address] Phone: [phone] Fax: [phone]     Social Drivers of Health (SDOH) Social History: SDOH Screenings   Food Insecurity: No Food Insecurity (12/05/2023)  Housing: Low Risk  (12/05/2023)  Transportation Needs: No Transportation Needs (12/05/2023)  Utilities: Not At Risk (12/06/2023)  Alcohol Screen: Low Risk  (11/16/2023)  Depression (PHQ2-9): Low Risk  (11/16/2023)  Financial Resource Strain: Low Risk  (11/16/2023)  Physical Activity: Insufficiently Active (11/16/2023)  Social Connections: Moderately Isolated (11/16/2023)  Stress: No Stress Concern Present (11/16/2023)  Tobacco Use: Low Risk  (12/04/2023)   SDOH Interventions:     Readmission Risk Interventions     No data to display

## 2023-12-16 NOTE — Progress Notes (Signed)
 Heart Failure Nurse Navigator Progress Note    Patient previous HF TOC appointment on 12/19/2023 was rescheduled due to still in hospital. New HF [facility] appointment on 12/22/2023 @ 8:15 am. AVS updated.    Stephane Haddock, BSN, Scientist, clinical (histocompatibility and immunogenetics) Only

## 2023-12-16 NOTE — Progress Notes (Signed)
 Physical Therapy Discharge Note Patient Details Name: Amanda Paul MRN: [id number] DOB: 05-18-1975 Today's Date: 12/16/2023   History of Present Illness Pt is 48 year old presented to [facility] on  12/04/23 for acute abd pain. Pt transferred to [facility] with uterine rupture of necrotic fibroid with chemical peritonitis. Pt underwent emergent total abdominal hysterectomy and bil salpingo-oopherectomy. PMH - uterine fibroids, htn, obesity,    PT Comments  Pt is currently Mod I for all functional activities including sit to stand, gait without AD and able to navigate one step per home set up height without UE support. Pt has previously demonstrated Mod I bed mobility. Pt will be discharged at this time from acute care hospital physical therapy services. Due to pt current functional status, home set up and available assistance at home recommending skilled physical therapy services 3x/week in order to address strength, balance and functional mobility to decrease risk for falls, injury and re-hospitalization.       If plan is discharge home, recommend the following: Assist for transportation     Equipment Recommendations  Rollator (4 wheels)       Precautions / Restrictions Precautions Precautions: Other (comment) Recall of Precautions/Restrictions: Intact Precaution/Restrictions Comments: abd protection precs, 2 JP drain Restrictions Weight Bearing Restrictions Per Provider Order: No     Mobility  Bed Mobility   General bed mobility comments: Pt sitting up in recliner at beginning/end of session    Transfers Overall transfer level: Modified independent Equipment used: None Transfers: Sit to/from Stand, Bed to chair/wheelchair/BSC Sit to Stand: Modified independent (Device/Increase time)   Step pivot transfers: Modified independent (Device/Increase time)       General transfer comment: No assist needed to stand today. Good hand placement; very minimal increase in time due to pain.  No upper extremity support throughout session    Ambulation/Gait Ambulation/Gait assistance: Modified independent (Device/Increase time) Gait Distance (Feet): 10 Feet Assistive device: None Gait Pattern/deviations: Step-through pattern, Decreased stride length Gait velocity: decreased Gait velocity interpretation: <1.31 ft/sec, indicative of household ambulator   General Gait Details: Pt ambulates slowly with guarded movement. No UE support no LOB   Stairs Stairs: Yes Stairs assistance: Modified independent (Device/Increase time) Stair Management: No rails, Forwards, Step to pattern Number of Stairs: 1 General stair comments: pt performed 1 step up at height of steps at home without UE support, no difficulty no UE support      Balance Overall balance assessment: No apparent balance deficits (not formally assessed) Sitting-balance support: Feet supported Sitting balance-Leahy Scale: Good     Standing balance support: No upper extremity supported Standing balance-Leahy Scale: Good        Communication Communication Communication: No apparent difficulties  Cognition Arousal: Alert Behavior During Therapy: WFL for tasks assessed/performed   PT - Cognitive impairments: No apparent impairments         Following commands: Intact            General Comments General comments (skin integrity, edema, etc.): no signs/symptoms of cardiac/respiratory distress. Pt on room air      Pertinent Vitals/Pain Pain Assessment Pain Assessment: Faces Faces Pain Scale: Hurts a little bit Pain Location: abdomen with movement and R JP drain Pain Descriptors / Indicators: Guarding, Grimacing Pain Intervention(s): Monitored during session, Limited activity within patient's tolerance     PT Goals (current goals can now be found in the care plan section) Progress towards PT goals: Goals met/education completed, patient discharged from PT    Frequency  Min 2X/week      PT  Plan  Pt will be discharged at this time.        AM-PAC PT 6 Clicks Mobility   Outcome Measure  Help needed turning from your back to your side while in a flat bed without using bedrails?: None Help needed moving from lying on your back to sitting on the side of a flat bed without using bedrails?: None Help needed moving to and from a bed to a chair (including a wheelchair)?: None Help needed standing up from a chair using your arms (e.g., wheelchair or bedside chair)?: None Help needed to walk in hospital room?: None Help needed climbing 3-5 steps with a railing? : None 6 Click Score: 24    End of Session Equipment Utilized During Treatment: Gait belt Activity Tolerance: Patient tolerated treatment well Patient left: with call bell/phone within reach;in chair;with nursing/sitter in room Nurse Communication: Mobility status PT Visit Diagnosis: Unsteadiness on feet (R26.81);Other abnormalities of gait and mobility (R26.89);Muscle weakness (generalized) (M62.81);Pain     Time: 1322-1340 PT Time Calculation (min) (ACUTE ONLY): 18 min  Charges:    $Therapeutic Activity: 8-22 mins PT General Charges $$ ACUTE PT VISIT: 1 Visit                    Dorothyann Maier, DPT, CLT  Acute Rehabilitation Services Office: [phone] (Secure chat preferred)    Dorothyann VEAR Maier 12/16/2023, 1:46 PM

## 2023-12-17 ENCOUNTER — Other Ambulatory Visit (HOSPITAL_COMMUNITY): Payer: Self-pay

## 2023-12-17 DIAGNOSIS — E876 Hypokalemia: Secondary | ICD-10-CM | POA: Diagnosis not present

## 2023-12-17 DIAGNOSIS — K651 Peritoneal abscess: Secondary | ICD-10-CM | POA: Diagnosis not present

## 2023-12-17 DIAGNOSIS — I5033 Acute on chronic diastolic (congestive) heart failure: Secondary | ICD-10-CM | POA: Diagnosis not present

## 2023-12-17 DIAGNOSIS — E1169 Type 2 diabetes mellitus with other specified complication: Secondary | ICD-10-CM | POA: Diagnosis not present

## 2023-12-17 LAB — RENAL FUNCTION PANEL
Albumin: 2 g/dL — ABNORMAL LOW (ref 3.5–5.0)
Anion gap: 9 (ref 5–15)
BUN: 14 mg/dL (ref 6–20)
CO2: 32 mmol/L (ref 22–32)
Calcium: 8.5 mg/dL — ABNORMAL LOW (ref 8.9–10.3)
Chloride: 92 mmol/L — ABNORMAL LOW (ref 98–111)
Creatinine, Ser: 0.6 mg/dL (ref 0.44–1.00)
GFR, Estimated: >60 mL/min (ref >60)
Glucose, Bld: 109 mg/dL — ABNORMAL HIGH (ref 70–99)
Phosphorus: 2.9 mg/dL (ref 2.5–4.6)
Potassium: 3.9 mmol/L (ref 3.5–5.1)
Sodium: 133 mmol/L — ABNORMAL LOW (ref 135–145)

## 2023-12-17 LAB — CBC
HCT: 29.8 % — ABNORMAL LOW (ref 36.0–46.0)
Hemoglobin: 8.9 g/dL — ABNORMAL LOW (ref 12.0–15.0)
MCH: 24.8 pg — ABNORMAL LOW (ref 26.0–34.0)
MCHC: 29.9 g/dL — ABNORMAL LOW (ref 30.0–36.0)
MCV: 83 fL (ref 80.0–100.0)
Platelets: 699 K/uL — ABNORMAL HIGH (ref 150–400)
RBC: 3.59 MIL/uL — ABNORMAL LOW (ref 3.87–5.11)
RDW: 18.6 % — ABNORMAL HIGH (ref 11.5–15.5)
WBC: 14.7 K/uL — ABNORMAL HIGH (ref 4.0–10.5)
nRBC: 0 % (ref 0.0–0.2)

## 2023-12-17 MED ORDER — METRONIDAZOLE 500 MG PO TABS
500.0000 mg | ORAL_TABLET | Freq: Two times a day (BID) | ORAL | 0 refills | Status: DC
  Filled 2023-12-17: qty 42, 21d supply, fill #0

## 2023-12-17 MED ORDER — TORSEMIDE 20 MG PO TABS
40.0000 mg | ORAL_TABLET | Freq: Every day | ORAL | 0 refills | Status: DC
  Filled 2023-12-17: qty 60, 30d supply, fill #0

## 2023-12-17 MED ORDER — SPIRONOLACTONE 25 MG PO TABS
25.0000 mg | ORAL_TABLET | Freq: Every day | ORAL | 0 refills | Status: DC
  Filled 2023-12-17: qty 30, 30d supply, fill #0

## 2023-12-17 MED ORDER — FERROUS SULFATE 325 (65 FE) MG PO TABS
325.0000 mg | ORAL_TABLET | ORAL | 0 refills | Status: DC
  Filled 2023-12-17: qty 30, 60d supply, fill #0

## 2023-12-17 MED ORDER — FERROUS SULFATE 325 (65 FE) MG PO TABS
325.0000 mg | ORAL_TABLET | ORAL | Status: DC
  Administered 2023-12-17: 325 mg via ORAL
  Filled 2023-12-17: qty 1

## 2023-12-17 MED ORDER — SODIUM CHLORIDE 0.9% FLUSH
INTRAVENOUS | 0 refills | Status: DC
  Filled 2023-12-17: qty 300, 30d supply, fill #0

## 2023-12-17 MED ORDER — POTASSIUM CHLORIDE CRYS ER 20 MEQ PO TBCR
40.0000 meq | EXTENDED_RELEASE_TABLET | Freq: Once | ORAL | Status: AC
  Administered 2023-12-17: 40 meq via ORAL
  Filled 2023-12-17: qty 2

## 2023-12-17 MED ORDER — LEVOFLOXACIN 750 MG PO TABS
750.0000 mg | ORAL_TABLET | Freq: Every day | ORAL | 0 refills | Status: DC
Start: 2023-12-18 — End: 2023-12-29
  Filled 2023-12-17: qty 21, 21d supply, fill #0

## 2023-12-17 NOTE — Progress Notes (Signed)
 Progress Note  In to see patient around 10am. Doing well, OOB to bathroom and pain under control. She is looking forward to discharge home today.  Patient examined. Staples removed from vertical midline incision, small area of superficial separation inferior to the umbilicus so steri strips were placed. LLQ drain removed without complication. Steri strips and small dressing placed.  Patient will be discharged this afternoon. Rx sent to [facility] pharmacy. Please see dc summ for full discharge examination.   Kieth Carolin, MD Obstetrician & Gynecologist, [facility] for [employer], [facility]

## 2023-12-17 NOTE — Discharge Instructions (Addendum)
 Post-surgical Instructions  CARE OF INCISION Allow the steri strips to fall off on their own this week. If they are not off within 7 days, you may remove them in the shower. You may shower.  Do not sit in a tub bath for at least 2 weeks or until cleared by a provider Avoid heavy lifting (more than 10 pounds/4.5 kilograms), pushing, or pulling.  Avoid activities that may risk injury to your incisions.   PAIN MANAGEMENT Ibuprofen  600mg .  (This is the same as 3-200mg  over the counter tablets of Motrin  or ibuprofen .)  Take this every 6 hours or as needed for cramping.   Acetaminophen  1000mg  (This is the same as 2-500mg  over the counter extra strength tylenol ). Take this every 6 hours as needed afterwards for pain  DO'S AND DON'T'S Do not take a tub bath for 2-4 weeks.  You may shower on the first day after your surgery Do not do any heavy lifting for 2-4 weeks.  This increases the chance of bleeding. Do move around as you feel able.  Stairs are fine.  You may begin to exercise again as you feel able.  Do not lift any weights. Do not put anything in the vagina for twelve weeks--no tampons, intercourse, or douching.    REGULAR MEDIATIONS/VITAMINS: You may restart all of your regular medications as prescribed. You may restart all of your vitamins as you normally take them.    PLEASE CALL OR SEEK MEDICAL CARE IF: You have persistent nausea and vomiting.  You have trouble eating or drinking.  You have an oral temperature above 100.5.  You have constipation that is not helped by adjusting diet or increasing fluid intake. Pain medicines are a common cause of constipation.  You have heavy vaginal bleeding You have redness or drainage from your incision(s) or there is increasing pain or tenderness near or in the surgical site.    Interventional Radiology Percutaneous Drain Placement After Care   This sheet gives you information about how to care for yourself after your procedure. Your health care  provider may also give you more specific instructions. Your drain was placed by an interventional radiologist with [facility]. If you have questions or concerns, contact [facility] at [phone].   What is a percutaneous drain?   A drain is a small plastic tube (catheter) that goes into the fluid collection in your body through your skin.   How long will I need the drain?   How long the drain needs to stay in is determined by where the drain is, how much comes out of the drain each day and if you are having any other surgical procedures.   Interventional radiology will determine when it is time to remove the drain. It is important to follow up as directed so that the drain can be removed as soon as it is safe to do so.   What can I expect after the procedure?   After the procedure, it is common to have:   A small amount of bruising and discomfort in the area where the drainage tube (catheter) was placed.   Sleepiness and fatigue. This should go away after the medicines you were given have worn off.   Follow these instructions at home:   Insertion site care   Check your insertion site when you change the bandage. Check for:   More redness, swelling, or pain.   More fluid or blood.   Warmth.   Pus or a bad smell.  When caring for your insertion site:   Wash your hands with soap and water for at least 20 seconds before and after you change your bandage (dressing). If soap and water are not available, use hand sanitizer.   You do not need to change your dressing everyday if it is clean and dry. Change your dressing every 3 days or as needed when it is soiled, wet or becoming dislodged. You will need to change your dressing each time you shower.   Leave stitches (sutures), skin glue, or adhesive strips in place. These skin closures may need to stay in place for 2 weeks or longer. If adhesive strip edges start to loosen and curl up, you may trim the loose  edges. Do not remove adhesive strips completely unless your health care provider tells you to do so.   Catheter care   Flush the catheter once per day with 5 mL of 0.9% normal saline unless you are told otherwise by your healthcare provider. This helps to prevent clogs in the catheter.   To disconnect the drain, turn the clear plastic tube to the left. Attach the saline syringe by placing it on the white end of the drain and turning gently to the right. Once attached gently push the plunger to the 5 mL mark. After you are done flushing, disconnect the syringe by turning to the left and reattach your drainage container   If you have a bulb please be sure the bulb is charged after reconnecting it - to do this pinch the bulb between your thumb and first finger and close the stopper located on the top of the bulb.    Check for fluid leaking from around your catheter (instead of fluid draining through your catheter). This may be a sign that the drain is no longer working correctly.   Write down the following information every time you empty your bag:   The date and time.   The amount of drainage.   Activity   Rest at home for 1-2 days after your procedure.   For the first 48 hours do not lift anything more than 10 lbs (about a gallon of milk). You may perform moderate activities/exercise. Please avoid strenuous activities during this time.   Avoid any activities which may pull on your drain as this can cause your drain to become dislodged.   If you were given a sedative during the procedure, it can affect you for several hours. Do not drive or operate machinery until your health care provider says that it is safe.   General instructions   For mild pain take over-the-counter medications as needed for pain such as Tylenol  or Advil . If you are experiencing severe pain please call our office as this may indicate an issue with your drain.    If you were prescribed an antibiotic medicine, take  it as told by your health care provider. Do not stop using the antibiotic even if you start to feel better.   You may shower 24 hours after the drain is placed. To do this cover the insertion site with a water tight material such as saran wrap and seal the edges with tape, you may also purchase waterproof dressings at your local drug store. Shower as usual and then remove the water tight dressing and any gauze/tape underneath it once you have exited the shower and dried off. Allow the area to air dry or pat dry with a clean towel. Once the skin is completely dry place  a new gauze dressing. It is important to keep the site dry at all times to prevent infection.   Do not submerge the drain - this means you cannot take baths, swim, use a hot tub, etc. until the drain is removed.    Do not use any products that contain nicotine or tobacco, such as cigarettes, e-cigarettes, and chewing tobacco. If you need help quitting, ask your health care provider.   Keep all follow-up visits as told by your health care provider. This is important.   Contact a health care provider if:   You have less than 10 mL of drainage a day for 2-3 days in a row, or as directed by your health care provider.   You have any of these signs of infection:   More redness, swelling, or pain around your incision area.   More fluid or blood coming from your incision area.   Warmth coming from your incision area.   Pus or a bad smell coming from your incision area.   You have fluid leaking from around your catheter (instead of through your catheter).   You are unable to flush the drain.   You have a fever or chills.   You have pain that does not get better with medicine.   You have not been contacted to schedule a drain follow up appointment within 5 days of discharge from the hospital.   Please call [facility] at [phone] with any questions or concerns.   Get help right away if:   Your catheter comes  out.   You suddenly stop having drainage from your catheter.   You suddenly have blood in the fluid that is draining from your catheter.   You become dizzy or you faint.   You develop a rash.   You have nausea or vomiting.   You have difficulty breathing or you feel short of breath.   You develop chest pain.   You have problems with your speech or vision.   You have trouble balancing or moving your arms or legs.   Summary   It is common to have a small amount of bruising and discomfort in the area where the drainage tube (catheter) was placed. You may also have minor discomfort with movement while the drain is in place.   Flush the drain once per day with 5 mL of 0.9% normal saline (unless you were told otherwise by your healthcare provider).    Record the amount of drainage from the bag every time you empty it.   Change the dressing every 3 days or earlier if soiled/wet. Keep the skin dry under the dressing.   You may shower with the drain in place. Do not submerge the drain (no baths, swimming, hot tubs, etc.).   Contact [facility] at [phone] if you have more redness, swelling, or pain around your incision area or if you have pain that does not get better with medicine.   This information is not intended to replace advice given to you by your health care provider. Make sure you discuss any questions you have with your health care provider.   Document Revised: 07/02/2021 Document Reviewed: 03/24/2019   Elsevier Patient Education  2023 [employer]         Interventional Radiology Drain Record   Empty your drain at least once per day. You may empty it as often as needed. Use this form to write down the amount of fluid that has collected in  the drainage container. Bring this form with you to your follow-up visits. Please call [facility] at [phone] with any questions or concerns prior to your appointment.   Drain #1 location:  ___________________   Date __________ Time __________ Amount __________   Date __________ Time __________ Amount __________   Date __________ Time __________ Amount __________   Date __________ Time __________ Amount __________   Date __________ Time __________ Amount __________   Date __________ Time __________ Amount __________   Date __________ Time __________ Amount __________   Date __________ Time __________ Amount __________   Date __________ Time __________ Amount __________   Date __________ Time __________ Amount __________   Date __________ Time __________ Amount __________   Date __________ Time __________ Amount __________   Date __________ Time __________ Amount __________   Date __________ Time __________ Amount __________

## 2023-12-17 NOTE — Plan of Care (Signed)

## 2023-12-17 NOTE — TOC CM/SW Note (Addendum)
 Met with pt at bedside to discuss the DME. Pt reports that she hasn't received the DME (Rollator and tub bench). Contacted Ada at [facility] to discuss the DME. Ada reports that they are waiting for the co-payment. She will f/u with the pt regarding the co-payment and will f/u with CM.   10:33 - Addendum - Ada reports that pt received a shower chair and a RW in 2023 and her insurance won't pay for the rollator or the tub bench. She reports that pt has to pay out-of-pocket if she  wants the DME. Met with pt again. She reports that she received the DME after she had foot sx and she gave away the equipment. Informed pt that the RW is $60 at [facility]. She reports that she doesn't need the DME. She reports that she is going to the BR without the walker. She reports that her apartment is small and the BR is very closed. She reports that she will get the RW later if  needed. She reports that a friend will provide transportation at time of DC.

## 2023-12-17 NOTE — Progress Notes (Signed)
 Progress Note   Patient: Amanda Paul FMW:[id number] DOB: 30-Nov-1975 DOA: 12/04/2023     13 DOS: the patient was seen and examined on 12/17/2023   Brief hospital course: Consult for medical management for respiratory failure with hypoxemia, RV failure.   48/F with obesity, hypertension, uterine fibroids was admitted to [facility] service 8/24 for hysterectomy for necrotic fibroid> chemical peritonitis, postop developed hypoxia. Patient reported having dyspnea, lower extremity edema and abdominal pain.  At the time of consultation she was found volume overloaded, with blood pressure 120/64, HR 87, RR 20 and 02 saturation 91% on 4 L/min per [place].  Lungs with bilateral rales with no wheezing, heart with S1 and S2 present and regular, no murmurs, abdomen protuberant, and tender to palpation, positive lower extremity edema.   08/25 labs  ABG 7.46/ 46/ 66/ 32/ 95%   Na 137, K 3.7 Cl 105 bicarbonate 26 glucose 122, bun 11 cr 0,52  Albumin  2 AST 14 ALT 13  total bilirubin 0,4  BNP 226  Wbc 10.5 hgb 9,4 plt 359  TSH 0,33 Free T4 1.43   Chest radiograph with hypoinflation, right hemidiaphragm elevation, bilateral hilar vascular congestion. Small right pleural effusion.  EKG 143 bpm, normal axis, normal intervals, qtc 499, sinus rhythm with no significant ST segment or T wave changes. Low voltage.   08/26 Patient was placed on Bipap for respiratory distress. Placed on furosemide  for diuresis with improvement in her symptoms.   Positive leukocytosis, placed on empiric antibiotic therapy.   08/29 ID consulted, CT abdomen and pelvis multiple intra-abdominal abscesses  IR consulted 8/31, drain placed in IR into right subdiaphragmatic fluid collection 09/04 improved volume status. Workup for pulmonary hypertension.  09/05 transition to oral antibiotic therapy.  09/06 clinically continue to improved, plan to continue oral antibiotic therapy follow up with ID, GYN and IR as outpatient. Scheduled  follow up with heart failure clinic.   Assessment and Plan: * Abscess of abdominal cavity (HCC) 08/24 total abdominal hysterectomy, bilateral salpingo oopherectomy.  08/31 drain catheter placement into right subdiaphragmatic fluid collection.   Blood culture with no growth  Wound culture group B streptococcus (agalactiae)   09.03 CT abdomen and pelvis with right subdiaphragmatic fluid collection has only minimally decreased in size, pigtail catheter within this collection.  Small right pleural effusion with right lower lobe atelectasis.  Large amount of stool in the colon.   Today wbc is 14.7 and T max 100,5 on 09/05 at 20:47 hrs.     Patient has been out of bed and improving po intake.  Post op abdominal pain is controlled.  Continue bowel regimen.   Plan to continue with po antibiotic therapy with levofloxacin  and metronidazole  for 3 weeks.  Outpatient follow up with ID and IR.   Acute on chronic diastolic (congestive) heart failure (HCC) Echocardiogram with preserved LV systolic function with EF 55%, D shaped interventricular septum, RV with mild reduced systolic function, RVSP 49.1 mmHg, RA and LA with normal size, no significant valvular disease. Trivial pericardial effusion.   Echocardiogram from 07/2023 personally reviewed with some flattening D shape interventricular septum suggesting chronic pulmonary hypertension.   Acute on chronic cor pulmonale.  RV failure   CT chest with indeterminate examination for evaluation of the pulmonary arteries.   V/Q scan negative for pulmonary embolism and lower extremity doppler ultrasound negative for deep vein thrombosis.   Patient was placed on IV furosemide  for diuresis, negative fluid balance was achieved, -13,742 ml with significant improvement in her symptoms.  Continue medical therapy with torsemide  40 mg po daily Spironolactone  25 mg po daily Hold on amlodipine  to avoid hypotension.   Acute cardiogenic pulmonary edema, with  right pleural effusion.  Volume status improved along with oxygenation.  CT from 09/03 showing small right pleural effusion.    At the time of discharge her 02 saturation is 95% on room air.  Plan to continue diuresis and incentive spirometer.    Hypokalemia Hyponatremia.   At the time of discharge her renal function is stable with serum cr at 0,60 with K at 3,9 and serum bicarbonate at 32  Na 133 and P 2.9 Mg 2.0   Plan to continue diuresis with torsemide  and spironolactone  Will give 40 meq po x1 today, and plan to follow up renal function and electrolytes as outpatient within 7 days.   DM2 (diabetes mellitus, type 2) (HCC) Glucose has been stable, at the time of discharge her fasting glucose is 109 mg/dl.   Severe hypoalbuminemia.   Mildly reduced TSH and mildly elevated free T4, patient with no hyperthyroid symptoms, will plan to repeat thyroid function test as outpatient.    Obesity, class 3 Calculated BMI is 40,2         Subjective: Patient is feeling better, abdominal pain has been controlled, no chest pain or dyspnea, she has been working on incentive spirometry and has been tolerating po well.   Physical Exam: Vitals:   12/16/23 2047 12/16/23 2329 12/17/23 0420 12/17/23 0746  BP: 115/77 124/68 117/73 128/76  Pulse: 100 96 84 83  Resp: 20 20 20 20   Temp: (!) 100.5 F (38.1 C) 99.4 F (37.4 C) 98.6 F (37 C) 98.3 F (36.8 C)  TempSrc: Oral Oral Oral Oral  SpO2: 93% 94% 94% 95%  Weight:      Height:       Neurology awake and alert ENT with mild pallor Cardiovascular with S1 and S2 present and regular, with no gallops, rubs or murmurs Respiratory with no wheezing, or rhonchi, mild rales at the right base.  Abdomen protuberant, not distended and not tender to superficial palpation drains in place, with serosanguinous fluid.  No lower extremity edema   Data Reviewed:    Family Communication: no family at the bedside   Disposition: Status is:  Inpatient Remains inpatient appropriate because: Patient is medically stable for discharge.   Planned Discharge Destination: Home    Author: Elidia Toribio Furnace, MD 12/17/2023 9:30 AM  For on call review [email or web address].

## 2023-12-17 NOTE — Progress Notes (Signed)
 DISCHARGE NOTE HOME Amanda Paul to be discharged Home per MD order. Discussed prescriptions and follow up appointments with the patient. Prescriptions given to patient; medication list explained in detail. Patient verbalized understanding.  Skin clean, dry and intact without evidence of skin break down, no evidence of skin tears noted. IV catheter discontinued intact. Site without signs and symptoms of complications. Dressing and pressure applied. Pt denies pain at the site currently. No complaints noted.  Patient free of lines, drains, and wounds.   An After Visit Summary (AVS) was printed and given to the patient. Patient escorted via wheelchair, and discharged home via private auto.  Peyton SHAUNNA Pepper, RN

## 2023-12-18 ENCOUNTER — Other Ambulatory Visit (HOSPITAL_COMMUNITY): Payer: Self-pay

## 2023-12-19 ENCOUNTER — Other Ambulatory Visit (HOSPITAL_COMMUNITY): Payer: Self-pay

## 2023-12-19 ENCOUNTER — Encounter (HOSPITAL_COMMUNITY)

## 2023-12-20 ENCOUNTER — Telehealth (HOSPITAL_COMMUNITY): Payer: Self-pay | Admitting: Student

## 2023-12-20 ENCOUNTER — Telehealth (HOSPITAL_COMMUNITY): Payer: Self-pay

## 2023-12-20 ENCOUNTER — Other Ambulatory Visit: Payer: Self-pay | Admitting: Obstetrics and Gynecology

## 2023-12-20 DIAGNOSIS — K651 Peritoneal abscess: Secondary | ICD-10-CM

## 2023-12-20 NOTE — Telephone Encounter (Signed)
 Called to confirm/remind patient of their appointment at [facility] on 12/21/23 8:15.   Appointment:   [x] Confirmed  [] Left mess   [] No answer/No voice mail  [] VM Full/unable to leave message  [] Phone not in service  Patient reminded to bring all medications and/or complete list.  Confirmed patient has transportation. Gave directions, instructed to utilize valet parking.  SABRA

## 2023-12-20 NOTE — Progress Notes (Signed)
 HEART & VASCULAR TRANSITION OF CARE CONSULT NOTE   Referring Physician: Dr. Noralee Primary Cardiologist: Tobb, Kardie, DO PCP: Jarold Medici, MD   HPI: Referred to clinic by Dr. Arrien for heart failure consultation.   Amanda Paul is a 48 y.o. female with history of HFpEF, morbid obesity, coronary calcification noted on chest CT, HLD, HTN, uterine leiomyoma.   She has significant family history of heart disease. Mother has heart failure and severe COPD and he father had a mild heart attack 2 years ago.  Recently admitted 12/06/23 for acute abdominal pain and found to have necrotic fibroid uterus with rupture s/p TAH/BSO c/b chemical peritonitis s/p drain placement and IV antibiotics. Also complicated by new CHF, pneumonia, and hypoxia. Echo at the beginning of the admission showed new, mild RV dysfunction with overload, as well as moderately elevated pulmonary artery pressures. CTPE 8/25 inconclusive for assessing for PE. V/Q scan 9/4 negative. Drain remain in place on DOD, she was sent with 3 weeks PO abx.   She presents for transition of care visit today. Overall feeling well. NYHA II. Reports fatigue, moving slower. Denies chest pain, dyspnea, palpitations, and dizziness. Able to perform ADLs. Appetite okay, not cooking with salt. Weight at home 243 lb, was around 280 lb prior to admission. Compliant with all medications. Denies ETOH, tobacco, or drug use. Currently on short term disability, works full time as at [employer].   Weeks prior to admission, she had been having flu-like symptoms, with productive cough that was not relieved with antibiotics. Around the same time, reports significant lower extremity edema (provided picture in clinic today, 3-4+ BLE edema).   Cardiac Testing  - Echo 8/25 with EF 55%, G2DD, mod RV dysfunction with D-shaped septum, RVSP 49.1 mmHg - Echo 4/25 with EF 55-60%, RWMAs, normal RV  Past Medical History:  Diagnosis Date   Allergy     Arthritis    Hernia, umbilical    Hyperlipidemia    Hypertension    Low grade squamous intraepithelial lesion (LGSIL) on cervicovaginal cytologic smear 06/10/2000   Morbid obesity (HCC)    Pain in right hand 01/19/2019   Prediabetes 03/21/2023   Umbilical hernia 04/14/2012   Uterine fibroids     Current Outpatient Medications  Medication Sig Dispense Refill   cetirizine (ZYRTEC) 10 MG tablet Take 10 mg by mouth daily.     Cholecalciferol (VITAMIN D ) 50 MCG (2000 UT) CAPS Take 2,000 Units by mouth at bedtime.     ferrous sulfate  325 (65 FE) MG tablet Take 1 tablet (325 mg total) by mouth every other day. 30 tablet 0   ibuprofen  (ADVIL ) 600 MG tablet Take 1 tablet (600 mg total) by mouth every 6 (six) hours as needed. 30 tablet 0   levofloxacin  (LEVAQUIN ) 750 MG tablet Take 1 tablet (750 mg total) by mouth daily. 21 tablet 0   losartan  (COZAAR ) 25 MG tablet Take 1 tablet (25 mg total) by mouth daily. 30 tablet 5   metroNIDAZOLE  (FLAGYL ) 500 MG tablet Take 1 tablet (500 mg total) by mouth every 12 (twelve) hours for 21 days. 42 tablet 0   Multiple Vitamins-Minerals (MULTIVITAMIN WITH MINERALS) tablet Take 1 tablet by mouth daily.     rosuvastatin  (CRESTOR ) 20 MG tablet Take 1 tablet (20 mg total) by mouth daily. 90 tablet 1   sodium chloride  flush (NS) 0.9 % SOLN Please flush drain one per day with 5mL of prefilled normal saline syringe 300 mL 0  spironolactone  (ALDACTONE ) 25 MG tablet Take 1 tablet (25 mg total) by mouth daily. 30 tablet 0   torsemide  (DEMADEX ) 20 MG tablet Take 2 tablets (40 mg total) by mouth daily. 60 tablet 0   benzonatate  (TESSALON  PERLES) 100 MG capsule Take 1 capsule (100 mg total) by mouth 3 (three) times daily as needed for cough. (Patient not taking: Reported on 12/21/2023) 30 capsule 0   meloxicam  (MOBIC ) 15 MG tablet TAKE 1 TABLET (15 MG TOTAL) BY MOUTH DAILY. (Patient not taking: Reported on 12/21/2023) 30 tablet 3   No current facility-administered medications  for this encounter.    Allergies  Allergen Reactions   Amoxicillin Hives, Itching and Swelling    Swollen eyes and throat.   Doxycycline  Hyclate Swelling    Bilateral lower leg swelling   Penicillins Hives and Swelling    Social History   Socioeconomic History   Marital status: Single    Spouse name: Not on file   Number of children: Not on file   Years of education: Not on file   Highest education level: Some college, no degree  Occupational History   Not on file  Tobacco Use   Smoking status: Never   Smokeless tobacco: Never  Vaping Use   Vaping status: Never Used  Substance and Sexual Activity   Alcohol use: Yes    Alcohol/week: 1.0 standard drink of alcohol    Types: 1 Cans of beer per week    Comment: rare   Drug use: No   Sexual activity: Not Currently  Other Topics Concern   Not on file  Social History Narrative   Marital status: single      Children: none      Lives: alone      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         Social Drivers of Health   Financial Resource Strain: Low Risk  (11/16/2023)   Overall Financial Resource Strain (CARDIA)    Difficulty of Paying Living Expenses: Not hard at all  Food Insecurity: No Food Insecurity (12/05/2023)   Hunger Vital Sign    Worried About Running Out of Food in the Last Year: Never true    Ran Out of Food in the Last Year: Never true  Transportation Needs: No Transportation Needs (12/05/2023)   PRAPARE - Administrator, Civil Service (Medical): No    Lack of Transportation (Non-Medical): No  Physical Activity: Insufficiently Active (11/16/2023)   Exercise Vital Sign    Days of Exercise per Week: 1 day    Minutes of Exercise per Session: 10 min  Stress: No Stress Concern Present (11/16/2023)   [employer] of Occupational Health - Occupational Stress Questionnaire    Feeling of Stress: Only a little  Social Connections: Moderately Isolated (11/16/2023)   Social Connection and Isolation Panel     Frequency of Communication with Friends and Family: More than three times a week    Frequency of Social Gatherings with Friends and Family: Three times a week    Attends Religious Services: 1 to 4 times per year    Active Member of Clubs or Organizations: No    Attends Banker Meetings: Not on file    Marital Status: Never married  Intimate Partner Violence: Unknown (12/12/2023)   Humiliation, Afraid, Rape, and Kick questionnaire    Fear of Current or Ex-Partner: Not on file    Emotionally Abused: No    Physically Abused: Not on file  Sexually Abused: Not on file     Family History  Problem Relation Age of Onset   Hypertension Mother    Colon polyps Neg Hx    Colon cancer Neg Hx    Esophageal cancer Neg Hx    Rectal cancer Neg Hx    Stomach cancer Neg Hx    Vitals:   12/21/23 0832  BP: 138/82  Pulse: 99  SpO2: 95%  Weight: 108.7 kg (239 lb 9.6 oz)  Height: 5' 5 (1.651 m)   Filed Weights   12/21/23 9167  Weight: 108.7 kg (239 lb 9.6 oz)   PHYSICAL EXAM: General: Well appearing. No distress on RA Cardiac: JVP flat. S1 and S2 present. No murmurs or rub. Resp: Lung sounds clear, diminished on R Extremities: Warm and dry.  No peripheral edema.  Neuro: Alert and oriented x3. Affect pleasant.  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 V/Q scan 9/4 negative for acute PE, however large R pleural effusion - send autoimmune labs (ANA, SCL70, RF, Antiphospholipid AB panel, centromere antibodies) - schedule PFTs and sleep study - NYHA II. Euvolemic on exam - continue torsemide  40 mg daily, BMET today - continue spiro 25 mg daily - start SGLT2i after drain is out -  given severity of illness at time of last echo, will schedule repeat  - if repeat echo remains with evidence of RV dysfunction and pHTN, with young age likely will need RHC   HTN - start losartan  25 mg daily  HLD - calcium  score 54 in May - crestor  20 mg daily  Obesity - Body mass index is 39.87 kg/m. - lost significant weight during admission - sleep study  ID - necrotic fibroid uterus with rupture s/p TAH/BSO c/b chemical peritonitis s/p drain & abx - remains with drain - on levofloxacin   Referred to HFSW (PCP, Medications, Transportation, ETOH Abuse, Drug Abuse, Insurance, Financial): No Refer to Pharmacy: No Refer to Home Health:  No Refer to Advanced [facility] Failure Clinic: Yes  Refer to General Cardiology: No  Follow up with Dr. Zenaida in 1 month  Amanda Lasheena Frieze, NP 12/21/23

## 2023-12-20 NOTE — Telephone Encounter (Signed)
 Patient with a RUQ/Subdiaphragmatic drain placed in IR 8/31. Order placed for outpatient CT abdomen with drain evaluation. Patient aware a scheduler from our clinic will call her to arrange date/time.   Genowefa Morga, AGACNP-BC 12/20/2023, 2:12 PM

## 2023-12-21 ENCOUNTER — Ambulatory Visit (HOSPITAL_COMMUNITY)
Admit: 2023-12-21 | Discharge: 2023-12-21 | Disposition: A | Discharge status: Home / Self Care | Source: Ambulatory Visit | Attending: Cardiology | Admitting: Cardiology

## 2023-12-21 ENCOUNTER — Telehealth (HOSPITAL_COMMUNITY): Payer: Self-pay | Admitting: *Deleted

## 2023-12-21 ENCOUNTER — Encounter (HOSPITAL_COMMUNITY): Payer: Self-pay

## 2023-12-21 ENCOUNTER — Ambulatory Visit (HOSPITAL_COMMUNITY): Payer: Self-pay | Admitting: Cardiology

## 2023-12-21 VITALS — BP 138/82 | HR 99 | Ht 65.0 in | Wt 239.6 lb

## 2023-12-21 DIAGNOSIS — I50810 Right heart failure, unspecified: Secondary | ICD-10-CM | POA: Diagnosis not present

## 2023-12-21 DIAGNOSIS — I10 Essential (primary) hypertension: Secondary | ICD-10-CM | POA: Diagnosis not present

## 2023-12-21 DIAGNOSIS — E785 Hyperlipidemia, unspecified: Secondary | ICD-10-CM | POA: Diagnosis not present

## 2023-12-21 DIAGNOSIS — I110 Hypertensive heart disease with heart failure: Secondary | ICD-10-CM | POA: Diagnosis not present

## 2023-12-21 DIAGNOSIS — I5032 Chronic diastolic (congestive) heart failure: Secondary | ICD-10-CM | POA: Diagnosis present

## 2023-12-21 DIAGNOSIS — E66812 Obesity, class 2: Secondary | ICD-10-CM

## 2023-12-21 DIAGNOSIS — I2781 Cor pulmonale (chronic): Secondary | ICD-10-CM | POA: Insufficient documentation

## 2023-12-21 DIAGNOSIS — I2510 Atherosclerotic heart disease of native coronary artery without angina pectoris: Secondary | ICD-10-CM | POA: Diagnosis not present

## 2023-12-21 DIAGNOSIS — Z79899 Other long term (current) drug therapy: Secondary | ICD-10-CM | POA: Diagnosis not present

## 2023-12-21 DIAGNOSIS — J90 Pleural effusion, not elsewhere classified: Secondary | ICD-10-CM | POA: Diagnosis not present

## 2023-12-21 DIAGNOSIS — I272 Pulmonary hypertension, unspecified: Secondary | ICD-10-CM | POA: Insufficient documentation

## 2023-12-21 DIAGNOSIS — D259 Leiomyoma of uterus, unspecified: Secondary | ICD-10-CM | POA: Diagnosis not present

## 2023-12-21 DIAGNOSIS — Z6839 Body mass index (BMI) 39.0-39.9, adult: Secondary | ICD-10-CM | POA: Insufficient documentation

## 2023-12-21 LAB — BASIC METABOLIC PANEL WITH GFR
Anion gap: 11 (ref 5–15)
BUN: 14 mg/dL (ref 6–20)
CO2: 30 mmol/L (ref 22–32)
Calcium: 9 mg/dL (ref 8.9–10.3)
Chloride: 95 mmol/L — ABNORMAL LOW (ref 98–111)
Creatinine, Ser: 0.59 mg/dL (ref 0.44–1.00)
GFR, Estimated: >60 mL/min (ref >60)
Glucose, Bld: 120 mg/dL — ABNORMAL HIGH (ref 70–99)
Potassium: 3 mmol/L — ABNORMAL LOW (ref 3.5–5.1)
Sodium: 136 mmol/L (ref 135–145)

## 2023-12-21 LAB — CBC
HCT: 33.6 % — ABNORMAL LOW (ref 36.0–46.0)
Hemoglobin: 10.1 g/dL — ABNORMAL LOW (ref 12.0–15.0)
MCH: 25 pg — ABNORMAL LOW (ref 26.0–34.0)
MCHC: 30.1 g/dL (ref 30.0–36.0)
MCV: 83.2 fL (ref 80.0–100.0)
Platelets: 766 K/uL — ABNORMAL HIGH (ref 150–400)
RBC: 4.04 MIL/uL (ref 3.87–5.11)
RDW: 19.9 % — ABNORMAL HIGH (ref 11.5–15.5)
WBC: 8.7 K/uL (ref 4.0–10.5)
nRBC: 0 % (ref 0.0–0.2)

## 2023-12-21 MED ORDER — POTASSIUM CHLORIDE CRYS ER 20 MEQ PO TBCR: 20.0000 meq | EXTENDED_RELEASE_TABLET | Freq: Every day | ORAL | 3 refills | Status: DC

## 2023-12-21 MED ORDER — LOSARTAN POTASSIUM 25 MG PO TABS: 25.0000 mg | ORAL_TABLET | Freq: Every day | ORAL | 5 refills | Status: AC

## 2023-12-21 NOTE — Progress Notes (Signed)
 ITAMAR home sleep study given to patient, all instructions explained, waiver signed, and CLOUDPAT registration complete.  Height:     Weight: BMI:  Today's Date:  STOP BANG RISK ASSESSMENT S (snore) Have you been told that you snore?     YES   T (tired) Are you often tired, fatigued, or sleepy during the day?   YES  O (obstruction) Do you stop breathing, choke, or gasp during sleep? NO   P (pressure) Do you have or are you being treated for high blood pressure? YES   B (BMI) Is your body index greater than 35 kg/m? YES   A (age) Are you 35 years old or older? YES   N (neck) Do you have a neck circumference greater than 16 inches?   NO   G (gender) Are you a female? NO   TOTAL STOP/BANG "YES" ANSWERS 5                                                                       For Office Use Only              Procedure Order Form    YES to 3+ Stop Bang questions OR two clinical symptoms - patient qualifies for WatchPAT (CPT 95800)      Clinical Notes: Will consult Sleep Specialist and refer for management of therapy due to patient increased risk of Sleep Apnea. Ordering a sleep study due to the following two clinical symptoms: Excessive daytime sleepiness G47.10 / Gastroesophageal reflux K21.9 / Nocturia R35.1 / Morning Headaches G44.221 / Difficulty concentrating R41.840 / Memory problems or poor judgment G31.84 / Personality changes or irritability R45.4 / Loud snoring R06.83 / Depression F32.9 / Unrefreshed by sleep G47.8 / Impotence N52.9 / History of high blood pressure R03.0 / Insomnia G47.00

## 2023-12-21 NOTE — Addendum Note (Signed)
 Addended by: JERONA DALTON HERO on: 12/21/2023 04:29 PM   Modules accepted: Orders

## 2023-12-21 NOTE — Patient Instructions (Signed)
 Medication Changes:  START LOSARTAN  25MG  ONCE DAILY   Lab Work:  Labs done today, your results will be available in MyChart, we will contact you for abnormal readings.  Testing/Procedures:  Your provider has recommended that you have a home sleep study (Itamar Test).  We have provided you with the equipment in our office today. Please go ahead and download the app. DO NOT OPEN OR TAMPER WITH THE BOX UNTIL WE ADVISE YOU TO DO SO. Once insurance has approved the test our office will call you with PIN number and approval to proceed with testing. Once you have completed the test you just dispose of the equipment, the information is automatically uploaded to us  via blue-tooth technology. If your test is positive for sleep apnea and you need a home CPAP machine you will be contacted by Dr Dorine office [facility]) to set this up.   Your physician has recommended that you have a pulmonary function test. Pulmonary Function Tests are a group of tests that measure how well air moves in and out of your lungs.  Follow-Up in:  1 MONTH WITH AN ECHOCARDIOGRAM AS SCHEDULED   At the [facility], you and your health needs are our priority. We have a designated team specialized in the treatment of Heart Failure. This Care Team includes your primary Heart Failure Specialized Cardiologist (physician), Advanced Practice Providers (APPs- Physician Assistants and Nurse Practitioners), and Pharmacist who all work together to provide you with the care you need, when you need it.   You may see any of the following providers on your designated Care Team at your next follow up:  Dr. Toribio Fuel Dr. Ezra Shuck Dr. Ria Commander Dr. Odis Brownie Greig Mosses, NP Caffie Shed, [place] [facility] [place], [place] Beckey Coe, NP [place] Lee, NP Tinnie Redman, PharmD   Please be sure to bring in all your medications bottles to every appointment.   Need to Contact Us :  If you  have any questions or concerns before your next appointment please send us  a message through [place] or call our office at [phone].    TO LEAVE A MESSAGE FOR THE NURSE SELECT OPTION 2, PLEASE LEAVE A MESSAGE INCLUDING: YOUR NAME DATE OF BIRTH CALL BACK NUMBER REASON FOR CALL**this is important as we prioritize the call backs  YOU WILL RECEIVE A CALL BACK THE SAME DAY AS LONG AS YOU CALL BEFORE 4:00 PM

## 2023-12-21 NOTE — Telephone Encounter (Signed)
 Called patient per [place] Lee, NP with following:  Potassium low at 3.0. Start KCL 20 mEq daily. Repeat BMET in 10 days.  Pt verbalized understanding of same. Repeat lab ordered and scheduled.

## 2023-12-22 ENCOUNTER — Other Ambulatory Visit (HOSPITAL_COMMUNITY): Payer: Self-pay

## 2023-12-22 LAB — RHEUMATOID FACTOR: Rheumatoid fact SerPl-aCnc: 15.8 [IU]/mL — ABNORMAL HIGH (ref <14.0)

## 2023-12-22 LAB — ANA W/REFLEX: Anti Nuclear Antibody (ANA): NEGATIVE

## 2023-12-22 LAB — ANTI-SCLERODERMA ANTIBODY: Scleroderma (Scl-70) (ENA) Antibody, IgG: <0.2 AI (ref 0.0–0.9)

## 2023-12-23 LAB — ANTIPHOSPHOLIPID SYNDROME EVAL, BLD
Anticardiolipin IgA: <9 U/mL (ref 0–11)
Anticardiolipin IgG: 15 GPL U/mL — ABNORMAL HIGH (ref 0–14)
Anticardiolipin IgM: 18 [MPL'U]/mL — ABNORMAL HIGH (ref 0–12)
DRVVT: 47.9 s — ABNORMAL HIGH (ref 0.0–47.0)
PTT Lupus Anticoagulant: 36 s (ref 0.0–43.5)
Phosphatydalserine, IgA: <1 U (ref 0–3)
Phosphatydalserine, IgG: 10 U (ref 0–20)
Phosphatydalserine, IgM: 21 U (ref 0–50)

## 2023-12-23 LAB — DRVVT CONFIRM: dRVVT Confirm: 1 ratio (ref 0.8–1.2)

## 2023-12-23 LAB — DRVVT MIX: dRVVT Mix: 42.9 s — ABNORMAL HIGH (ref 0.0–40.4)

## 2023-12-23 NOTE — Progress Notes (Signed)
 Referring Physician(s): Dr. Jayne  Chief Complaint: The patient is seen in follow up today s/p right subdiaphragmatic drain placement 12/11/23.   History of present illness:  Amanda Paul, 48 year old female, has a medical history significant for HTN, heart failure, DM2, obesity and uterine fibroids. She presented to the ED 12/04/23 with acute abdominal pain and CT abd/pelvis showed an enlarged uterus with multiple calcified fibroids. CT also showed moderate ascites with fluid appearance concerning for debris or hemorrhage. OB/GYN was consulted and the patient underwent a total abdominal hysterectomy with bilateral salpingo-oophorectomy 12/04/23. She was found to have a ruptured uterus from a necrotic fibroid and this resulted in peritonitis. A surgical JP drain was left in place.   A repeat CT scan 8/29 showed a right subdiaphragmatic fluid collection with significant mass effect on the right hepatic dome. This was concerning for an abscess and IR was consulted for aspiration/drain placement. On 12/11/23 the patient received a 12 Fr drain into this fluid collection. Cultures were negative and a follow up CT study on 12/14/23 showed a persistent but improving collection.  She was discharged from the hospital 12/17/23 and her surgically placed drain was removed. She presents to the IR outpatient clinic today for a drain evaluation with CT imaging and possible contrast drain injection under fluoroscopy. She has been flushing the drain with 5 ml NS daily and reports an output of 3-5 ml daily.   She is feeling well and denies pain/discomfort, nausea/vomiting, diarrhea, fever or chills. She remains on oral Flagyl  and she is scheduled to follow up with Dr. Cleatus 01/12/24.   Past Medical History:  Diagnosis Date   Allergy    Arthritis    Hernia, umbilical    Hyperlipidemia    Hypertension    Low grade squamous intraepithelial lesion (LGSIL) on cervicovaginal cytologic smear 06/10/2000   Morbid  obesity (HCC)    Pain in right hand 01/19/2019   Prediabetes 03/21/2023   Umbilical hernia 04/14/2012   Uterine fibroids     Past Surgical History:  Procedure Laterality Date   ADVANCEMENT / RECONSTRUCTION POSTERIOR TIBIAL TENDON / BENAY Left 10/09/2021   COLONOSCOPY WITH PROPOFOL  N/A 06/13/2020   Procedure: COLONOSCOPY WITH PROPOFOL ;  Surgeon: Rollin Dover, MD;  Location: [facility];  Service: Endoscopy;  Laterality: N/A;   HEMOSTASIS CLIP PLACEMENT  06/13/2020   Procedure: HEMOSTASIS CLIP PLACEMENT;  Surgeon: Rollin Dover, MD;  Location: [facility];  Service: Endoscopy;;   HERNIA REPAIR     HYSTERECTOMY ABDOMINAL WITH SALPINGO-OOPHORECTOMY N/A 12/04/2023   Procedure: HYSTERECTOMY, ABDOMINAL, WITH SALPINGO-OOPHORECTOMY;  Surgeon: Jayne Vonn DEL, MD;  Location: [facility] OR;  Service: Gynecology;  Laterality: N/A;   HYSTEROSCOPY WITH D & C N/A 08/26/2014   Procedure: DILATATION AND CURETTAGE /HYSTEROSCOPY and cervical repair of cervical laceration ;  Surgeon: Nena App, MD;  Location: [facility];  Service: Gynecology;  Laterality: N/A;   IR ANGIOGRAM PELVIS SELECTIVE OR SUPRASELECTIVE  10/08/2020   IR ANGIOGRAM PELVIS SELECTIVE OR SUPRASELECTIVE  10/08/2020   IR ANGIOGRAM SELECTIVE EACH ADDITIONAL VESSEL  10/08/2020   IR EMBO TUMOR ORGAN ISCHEMIA INFARCT INC GUIDE ROADMAPPING  10/08/2020   IR RADIOLOGIST EVAL & MGMT  07/10/2020   IR RADIOLOGIST EVAL & MGMT  07/31/2020   IR RADIOLOGIST EVAL & MGMT  09/09/2020   IR RADIOLOGIST EVAL & MGMT  11/18/2020   IR RADIOLOGIST EVAL & MGMT  09/14/2021   IR US  GUIDE VASC ACCESS RIGHT  10/08/2020   POLYPECTOMY  06/13/2020   Procedure: POLYPECTOMY;  Surgeon: Rollin Dover, MD;  Location: [facility];  Service: Endoscopy;;   UMBILICAL HERNIA REPAIR     WISDOM TOOTH EXTRACTION      Allergies: Amoxicillin, Doxycycline  hyclate, and Penicillins  Medications: Prior to Admission medications   Medication Sig Start Date End Date Taking? Authorizing  Provider  benzonatate  (TESSALON  PERLES) 100 MG capsule Take 1 capsule (100 mg total) by mouth 3 (three) times daily as needed for cough. Patient not taking: Reported on 12/21/2023 10/30/23 10/29/24  Christopher Savannah, PA-C  cetirizine (ZYRTEC) 10 MG tablet Take 10 mg by mouth daily.    [provider]  Cholecalciferol (VITAMIN D ) 50 MCG (2000 UT) CAPS Take 2,000 Units by mouth at bedtime.    [provider]  ferrous sulfate  325 (65 FE) MG tablet Take 1 tablet (325 mg total) by mouth every other day. 12/17/23   Erik Kieth BROCKS, MD  ibuprofen  (ADVIL ) 600 MG tablet Take 1 tablet (600 mg total) by mouth every 6 (six) hours as needed. 11/24/23   Nivia Colon, PA-C  levofloxacin  (LEVAQUIN ) 750 MG tablet Take 1 tablet (750 mg total) by mouth daily. 12/18/23   Erik Kieth BROCKS, MD  losartan  (COZAAR ) 25 MG tablet Take 1 tablet (25 mg total) by mouth daily. 12/21/23   Lee, [place], NP  meloxicam  (MOBIC ) 15 MG tablet TAKE 1 TABLET (15 MG TOTAL) BY MOUTH DAILY. Patient not taking: Reported on 12/21/2023 09/19/23   Hyatt, Max T, DPM  metroNIDAZOLE  (FLAGYL ) 500 MG tablet Take 1 tablet (500 mg total) by mouth every 12 (twelve) hours for 21 days. 12/17/23 01/07/24  Erik Kieth BROCKS, MD  Multiple Vitamins-Minerals (MULTIVITAMIN WITH MINERALS) tablet Take 1 tablet by mouth daily.    [provider]  potassium chloride  SA (KLOR-CON  M) 20 MEQ tablet Take 1 tablet (20 mEq total) by mouth daily. 12/21/23   Milford, Harlene HERO, FNP  rosuvastatin  (CRESTOR ) 20 MG tablet Take 1 tablet (20 mg total) by mouth daily. 09/20/23 09/19/24  Jarold Medici, MD  sodium chloride  flush (NS) 0.9 % SOLN Please flush drain one per day with 5mL of prefilled normal saline syringe 12/17/23   Pommier, Wyatt H, PA-C  spironolactone  (ALDACTONE ) 25 MG tablet Take 1 tablet (25 mg total) by mouth daily. 12/18/23 01/17/24  Erik Kieth BROCKS, MD  torsemide  (DEMADEX ) 20 MG tablet Take 2 tablets (40 mg total) by mouth daily. 12/18/23   Erik Kieth BROCKS, MD     Family History  Problem Relation Age of Onset   Hypertension Mother    Colon polyps Neg Hx    Colon cancer Neg Hx    Esophageal cancer Neg Hx    Rectal cancer Neg Hx    Stomach cancer Neg Hx     Social History   Socioeconomic History   Marital status: Single    Spouse name: Not on file   Number of children: Not on file   Years of education: Not on file   Highest education level: Some college, no degree  Occupational History   Not on file  Tobacco Use   Smoking status: Never   Smokeless tobacco: Never  Vaping Use   Vaping status: Never Used  Substance and Sexual Activity   Alcohol use: Yes    Alcohol/week: 1.0 standard drink of alcohol    Types: 1 Cans of beer per week    Comment: rare   Drug use: No   Sexual activity: Not Currently  Other Topics Concern   Not on file  Social History Narrative   Marital status: single      Children: none      Lives: alone      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         Social Drivers of Health   Financial Resource Strain: Low Risk  (11/16/2023)   Overall Financial Resource Strain (CARDIA)    Difficulty of Paying Living Expenses: Not hard at all  Food Insecurity: No Food Insecurity (12/05/2023)   Hunger Vital Sign    Worried About Running Out of Food in the Last Year: Never true    Ran Out of Food in the Last Year: Never true  Transportation Needs: No Transportation Needs (12/05/2023)   PRAPARE - Administrator, Civil Service (Medical): No    Lack of Transportation (Non-Medical): No  Physical Activity: Insufficiently Active (11/16/2023)   Exercise Vital Sign    Days of Exercise per Week: 1 day    Minutes of Exercise per Session: 10 min  Stress: No Stress Concern Present (11/16/2023)   [employer] of Occupational Health - Occupational Stress Questionnaire    Feeling of Stress: Only a little  Social Connections: Moderately Isolated (11/16/2023)   Social Connection and Isolation Panel     Frequency of Communication with Friends and Family: More than three times a week    Frequency of Social Gatherings with Friends and Family: Three times a week    Attends Religious Services: 1 to 4 times per year    Active Member of Clubs or Organizations: No    Attends Banker Meetings: Not on file    Marital Status: Never married     Vital Signs: LMP 09/14/2023   Physical Exam Constitutional:      General: She is not in acute distress.    Appearance: She is obese. She is not ill-appearing.  Pulmonary:     Effort: Pulmonary effort is normal.  Abdominal:     Tenderness: There is no abdominal tenderness.     Comments: RUQ drain to suction. 2-3 ml of serous fluid in bulb. Suture intact. Skin insertion site is clean/dry. Drain easily flushed.   Skin:    General: Skin is warm and dry.  Neurological:     Mental Status: She is alert and oriented to person, place, and time.     Imaging: No results found.  Labs:  CBC: Recent Labs    12/15/23 0322 12/16/23 0305 12/17/23 0355 12/21/23 0937  WBC 16.3* 16.7* 14.7* 8.7  HGB 9.4* 9.5* 8.9* 10.1*  HCT 30.7* 31.2* 29.8* 33.6*  PLT 627* 665* 699* 766*    COAGS: Recent Labs    12/04/23 1454 12/10/23 1257  INR 1.2 1.2  APTT 31  --     BMP: Recent Labs    12/15/23 0322 12/16/23 0305 12/17/23 0355 12/21/23 0937  NA 131* 135 133* 136  K 3.4* 3.7 3.9 3.0*  CL 87* 89* 92* 95*  CO2 32 32 32 30  GLUCOSE 104* 107* 109* 120*  BUN 11 14 14 14   CALCIUM  8.4* 8.7* 8.5* 9.0  CREATININE 0.57 0.68 0.60 0.59  GFRNONAA >60 >60 >60 >60    LIVER FUNCTION TESTS: Recent Labs    12/08/23 0358 12/09/23 0342 12/10/23 0346 12/11/23 0333 12/17/23 0355  BILITOT 0.7 0.6 0.5 0.4  --   AST 12* 16 19 16   --   ALT 12 14 17 12   --   ALKPHOS 52 50 52 53  --  PROT 6.6 6.2* 6.4* 6.8  --   ALBUMIN  2.4* 2.0* 2.0* 2.1* 2.0*    Assessment:  48 year old female with a history of ruptured uterus secondary to a necrotic  fibroid with peritonitis. She is s/p hysterectomy and bilateral salpingo-oophorectomy which was complicated by post-procedure subdiaphragmatic fluid collection. She received a 12 Fr drain in IR 12/11/23.   CT imaging today shows resolution of the fluid collection. The drain was gently injected with contrast and this was negative for a fistula to the surrounding area. The drain was removed. Post-drain removal care discussed. The patient was also advised to seek medical attention if she develops any chest pain or shortness of breath.   Follow up with IR as needed.   Signed: Warren JONELLE Dais, NP 12/26/2023, 10:13 AM   Please refer to Dr. Terrill attestation of this note for management and plan.     I spent a total of 15 Minutes with the patient, greater than 50% of which was counseling/coordinating care for subdiaphragmatic fluid collection.

## 2023-12-26 ENCOUNTER — Ambulatory Visit (INDEPENDENT_AMBULATORY_CARE_PROVIDER_SITE_OTHER): Admitting: Internal Medicine

## 2023-12-26 ENCOUNTER — Ambulatory Visit
Admission: RE | Admit: 2023-12-26 | Discharge: 2023-12-26 | Disposition: A | Discharge status: Home / Self Care | Source: Ambulatory Visit | Attending: Student

## 2023-12-26 ENCOUNTER — Ambulatory Visit
Admission: RE | Admit: 2023-12-26 | Discharge: 2023-12-26 | Disposition: A | Discharge status: Home / Self Care | Source: Ambulatory Visit | Attending: Obstetrics and Gynecology | Admitting: Obstetrics and Gynecology

## 2023-12-26 VITALS — BP 118/82 | HR 91 | Temp 98.3°F | Ht 65.0 in | Wt 244.0 lb

## 2023-12-26 DIAGNOSIS — I2510 Atherosclerotic heart disease of native coronary artery without angina pectoris: Secondary | ICD-10-CM

## 2023-12-26 DIAGNOSIS — J690 Pneumonitis due to inhalation of food and vomit: Secondary | ICD-10-CM | POA: Diagnosis not present

## 2023-12-26 DIAGNOSIS — I5033 Acute on chronic diastolic (congestive) heart failure: Secondary | ICD-10-CM

## 2023-12-26 DIAGNOSIS — E7800 Pure hypercholesterolemia, unspecified: Secondary | ICD-10-CM

## 2023-12-26 DIAGNOSIS — K651 Peritoneal abscess: Secondary | ICD-10-CM

## 2023-12-26 DIAGNOSIS — I110 Hypertensive heart disease with heart failure: Secondary | ICD-10-CM

## 2023-12-26 DIAGNOSIS — I119 Hypertensive heart disease without heart failure: Secondary | ICD-10-CM

## 2023-12-26 DIAGNOSIS — E66813 Obesity, class 3: Secondary | ICD-10-CM

## 2023-12-26 DIAGNOSIS — D259 Leiomyoma of uterus, unspecified: Secondary | ICD-10-CM

## 2023-12-26 DIAGNOSIS — I517 Cardiomegaly: Secondary | ICD-10-CM

## 2023-12-26 DIAGNOSIS — E876 Hypokalemia: Secondary | ICD-10-CM

## 2023-12-26 DIAGNOSIS — Z6841 Body Mass Index (BMI) 40.0 and over, adult: Secondary | ICD-10-CM

## 2023-12-26 DIAGNOSIS — E1169 Type 2 diabetes mellitus with other specified complication: Secondary | ICD-10-CM

## 2023-12-26 HISTORY — PX: IR RADIOLOGIST EVAL & MGMT: IMG5224

## 2023-12-26 MED ORDER — IOPAMIDOL (ISOVUE-300) INJECTION 61%
100.0000 mL | Freq: Once | INTRAVENOUS | Status: AC | PRN
  Administered 2023-12-26: 100 mL via INTRAVENOUS

## 2023-12-26 MED ORDER — IOPAMIDOL (ISOVUE-300) INJECTION 61%
30.0000 mL | Freq: Once | INTRAVENOUS | Status: AC | PRN
  Administered 2023-12-26: 7 mL

## 2023-12-26 NOTE — Progress Notes (Signed)
 I,Victoria T Emmitt, CMA,acting as a Neurosurgeon for Catheryn LOISE Slocumb, MD.,have documented all relevant documentation on the behalf of Catheryn LOISE Slocumb, MD,as directed by  Catheryn LOISE Slocumb, MD while in the presence of Catheryn LOISE Slocumb, MD.  Subjective:  Patient ID: Amanda Paul , female    DOB: 1975/06/08 , 48 y.o.   MRN: [id number]  Chief Complaint  Patient presents with   Hospitalization Follow-up    Patient presents today for hospital follow up. Admitted on 08/24 to [facility] then transferred to [facility]. Discharged on 12/17/23. Today she reports feeling good. She admits being a little weak.  This morning she completed a CT scan. Also JP drain removed from right side of her arm this morning.     HPI Discussed the use of AI scribe software for clinical note transcription with the patient, who gave verbal consent to proceed.  History of Present Illness Amanda Paul is a 48 year old female who presents for a hospital follow-up after recent hospitalization for acute abdomen and fibroids.  She was admitted to the hospital on August 24th and discharged on September 6th. Initially presenting with acute abdominal pain, she was diagnosed with an acute abdomen, fibroids, aspiration pneumonia, anemia, cardiomegaly, prediabetes, and a right upper quadrant abscess. An emergency hysterectomy with removal of her tubes and ovaries was performed due to the presence of four liters of necrotic fluid in the abdomen. Postoperatively, she developed pneumonia, which was treated with vancomycin  and cefepime . A CT scan showed fluid collection under the diaphragm, leading to the placement of a JP drain, which was removed today.  During her hospitalization, she experienced shortness of breath and fever on postoperative day one. A CT scan was negative for pulmonary embolism, and a chest X-ray was concerning for aspiration pneumonia. Her white blood cell count remained elevated, prompting an  infectious disease consultation. By discharge, her pain was managed with oral pain medications, and she was ambulating without issues. Her white blood cell count was still elevated at discharge, but recent blood work showed improvement.  She has a history of cardiomegaly, and an echocardiogram revealed diastolic dysfunction. She is currently on torsemide , spironolactone , losartan , and Crestor . She reports significant weight loss, from 294 pounds in August to 244 pounds currently, attributed to the removal of the abdominal fluid and changes in her appetite post-surgery.  She describes a significant change in her abdominal condition prior to hospitalization, noting a hard and distended abdomen, which she reported to her workplace. She experienced a sudden onset of intense abdominal symptoms while preparing for work, leading to her emergency room visit. She was initially taken to [facility] and then transferred to [facility] for further management.  Her appetite is improving, and she is maintaining hydration with water and occasional orange juice. She is not currently experiencing pain and is managing her nutrition with a focus on protein intake. She has not yet been cleared to return to work and anticipates a gradual return with part-time hours initially.   Past Medical History:  Diagnosis Date   Allergy    Arthritis    Hernia, umbilical    Hyperlipidemia    Hypertension    Low grade squamous intraepithelial lesion (LGSIL) on cervicovaginal cytologic smear 06/10/2000   Morbid obesity (HCC)    Pain in right hand 01/19/2019   Prediabetes 03/21/2023   Umbilical hernia 04/14/2012   Uterine fibroids      Family History  Problem Relation Age of Onset  Hypertension Mother    Colon polyps Neg Hx    Colon cancer Neg Hx    Esophageal cancer Neg Hx    Rectal cancer Neg Hx    Stomach cancer Neg Hx      Current Outpatient Medications:    cetirizine (ZYRTEC) 10 MG tablet, Take 10 mg by mouth  daily., Disp: , Rfl:    Cholecalciferol (VITAMIN D ) 50 MCG (2000 UT) CAPS, Take 2,000 Units by mouth at bedtime., Disp: , Rfl:    ferrous sulfate  325 (65 FE) MG tablet, Take 1 tablet (325 mg total) by mouth every other day., Disp: 30 tablet, Rfl: 0   ibuprofen  (ADVIL ) 600 MG tablet, Take 1 tablet (600 mg total) by mouth every 6 (six) hours as needed., Disp: 30 tablet, Rfl: 0   losartan  (COZAAR ) 25 MG tablet, Take 1 tablet (25 mg total) by mouth daily., Disp: 30 tablet, Rfl: 5   Multiple Vitamins-Minerals (MULTIVITAMIN WITH MINERALS) tablet, Take 1 tablet by mouth daily., Disp: , Rfl:    potassium chloride  SA (KLOR-CON  M) 20 MEQ tablet, Take 1 tablet (20 mEq total) by mouth daily., Disp: 90 tablet, Rfl: 3   rosuvastatin  (CRESTOR ) 20 MG tablet, Take 1 tablet (20 mg total) by mouth daily., Disp: 90 tablet, Rfl: 1   sodium chloride  flush (NS) 0.9 % SOLN, Please flush drain one per day with 5mL of prefilled normal saline syringe, Disp: 300 mL, Rfl: 0   spironolactone  (ALDACTONE ) 25 MG tablet, Take 1 tablet (25 mg total) by mouth daily., Disp: 30 tablet, Rfl: 0   torsemide  (DEMADEX ) 20 MG tablet, Take 2 tablets (40 mg total) by mouth daily., Disp: 60 tablet, Rfl: 0   benzonatate  (TESSALON  PERLES) 100 MG capsule, Take 1 capsule (100 mg total) by mouth 3 (three) times daily as needed for cough., Disp: 30 capsule, Rfl: 0   levofloxacin  (LEVAQUIN ) 750 MG tablet, Take 1 tablet (750 mg total) by mouth daily for 14 days., Disp: 14 tablet, Rfl: 0   metroNIDAZOLE  (FLAGYL ) 500 MG tablet, Take 1 tablet (500 mg total) by mouth 2 (two) times daily for 14 days., Disp: 28 tablet, Rfl: 0   Allergies  Allergen Reactions   Amoxicillin Hives, Itching and Swelling    Swollen eyes and throat.   Doxycycline  Hyclate Swelling    Bilateral lower leg swelling   Penicillins Hives and Swelling     Review of Systems  Constitutional: Negative.   Respiratory: Negative.    Cardiovascular: Negative.   Neurological: Negative.    Psychiatric/Behavioral: Negative.       Today's Vitals   12/26/23 1619  BP: 118/82  Pulse: 91  Temp: 98.3 F (36.8 C)  SpO2: 98%  Weight: 244 lb (110.7 kg)  Height: 5' 5 (1.651 m)   Body mass index is 40.6 kg/m.  Wt Readings from Last 3 Encounters:  12/29/23 243 lb (110.2 kg)  12/26/23 244 lb (110.7 kg)  12/21/23 239 lb 9.6 oz (108.7 kg)     Objective:  Physical Exam Vitals and nursing note reviewed.  Constitutional:      Appearance: Normal appearance. She is obese.  HENT:     Head: Normocephalic and atraumatic.  Eyes:     Extraocular Movements: Extraocular movements intact.  Cardiovascular:     Rate and Rhythm: Normal rate and regular rhythm.     Heart sounds: Normal heart sounds.  Pulmonary:     Effort: Pulmonary effort is normal.     Breath sounds: Normal breath sounds.  Musculoskeletal:     Cervical back: Normal range of motion.  Skin:    General: Skin is warm.  Neurological:     General: No focal deficit present.     Mental Status: She is alert.  Psychiatric:        Mood and Affect: Mood normal.        Behavior: Behavior normal.         Assessment And Plan:  Abscess of abdominal cavity (HCC) Assessment & Plan: TCM PERFORMED.  Unfortunately, no TCM call performed w/in 48 hours.  DISCHARGE SUMMARY WAS REVIEWED IN FULL DETAIL DURING THE VISIT. MEDS RECONCILED AND COMPARED TO DISCHARGE MEDS. MEDICATION LIST WAS UPDATED AND REVIEWED WITH THE PATIENT. GREATER THAN 50% FACE TO FACE TIME WAS SPENT IN COUNSELING AND COORDINATION OF CARE. ALL QUESTIONS WERE ANSWERED TO THE SATISFACTION OF THE PATIENT.   Postoperative recovery complicated by aspiration pneumonia, treated with antibiotics. Pain controlled, ambulating well, appetite and hydration improving. - Ensure adequate protein intake. - Continue oral pain management as needed. - Follow up with GYN for return to work clearance.    Uterine leiomyoma, unspecified location Assessment & Plan: She is s/p  hysterectomy   Aspiration pneumonia, unspecified aspiration pneumonia type, unspecified laterality, unspecified part of lung (HCC) Assessment & Plan: Postoperative, she developed pneumonia.  She was treated with vancomycin  and cefepime . - She is encouraged to stop eating 3 hrs prior to lying down - ID input is appreciated, encouraged to keep upconing ID appt   Hypertensive heart disease with acute on chronic diastolic congestive heart failure (HCC) Assessment & Plan: Hypertension managed with losartan , providing cardiac and renal benefits. - Continue losartan .   Coronary artery calcification Assessment & Plan: Calcium  score of 54 and elevated LP(a) suggest coronary artery disease. Trivial mitral valve regurgitation and regional wall motion abnormalities noted on echo. - She will continue with rosuvastatin  - Follow heart healthy lifestyle. - Unfortunately, she missed her Cardiology f/u due to hospitalization   Cardiomegaly Assessment & Plan: Cardiomegaly with diastolic dysfunction under evaluation for pulmonary hypertension. Echocardiogram scheduled. On high-dose diuretics and losartan  for cardiac and renal benefits. - Continue torsemide  and spironolactone . - Start losartan . - Schedule repeat echocardiogram on October 22nd.   Pure hypercholesterolemia Assessment & Plan: Chronic, LDL goal is less than 70 due to coronary arterial calcifications. She will continue with rosuvastatin , dose increased to 20mg . Encouraged to follow heart healthy lifestyle.    Class 3 severe obesity due to excess calories with body mass index (BMI) of 40.0 to 44.9 in adult Assessment & Plan: BMI 40.  She lost 50 lbs during her hospitalization. She is encouraged to gradually increase her protein intake. Increase activity as tolerated once cleared by GYN.     Return if symptoms worsen or fail to improve.  Patient was given opportunity to ask questions. Patient verbalized understanding of the plan and  was able to repeat key elements of the plan. All questions were answered to their satisfaction.   I, Catheryn LOISE Slocumb, MD, have reviewed all documentation for this visit. The documentation on 12/26/23 for the exam, diagnosis, procedures, and orders are all accurate and complete.   IF YOU HAVE BEEN REFERRED TO A SPECIALIST, IT MAY TAKE 1-2 WEEKS TO SCHEDULE/PROCESS THE REFERRAL. IF YOU HAVE NOT HEARD FROM US /SPECIALIST IN TWO WEEKS, PLEASE GIVE US  A CALL AT [phone].   THE PATIENT IS ENCOURAGED TO PRACTICE SOCIAL DISTANCING DUE TO THE COVID-19 PANDEMIC.

## 2023-12-27 ENCOUNTER — Encounter: Payer: Self-pay | Admitting: Obstetrics and Gynecology

## 2023-12-27 DIAGNOSIS — Z0289 Encounter for other administrative examinations: Secondary | ICD-10-CM

## 2023-12-29 ENCOUNTER — Other Ambulatory Visit: Payer: Self-pay

## 2023-12-29 ENCOUNTER — Ambulatory Visit (INDEPENDENT_AMBULATORY_CARE_PROVIDER_SITE_OTHER): Admitting: Internal Medicine

## 2023-12-29 ENCOUNTER — Telehealth: Payer: Self-pay | Admitting: Family Medicine

## 2023-12-29 ENCOUNTER — Encounter: Payer: Self-pay | Admitting: Internal Medicine

## 2023-12-29 VITALS — BP 127/83 | HR 104 | Temp 97.9°F | Ht 65.0 in | Wt 243.0 lb

## 2023-12-29 DIAGNOSIS — K651 Peritoneal abscess: Secondary | ICD-10-CM

## 2023-12-29 MED ORDER — METRONIDAZOLE 500 MG PO TABS: 500.0000 mg | ORAL_TABLET | Freq: Two times a day (BID) | ORAL | 0 refills | Status: AC

## 2023-12-29 MED ORDER — LEVOFLOXACIN 750 MG PO TABS: 750.0000 mg | ORAL_TABLET | Freq: Every day | ORAL | 0 refills | Status: AC

## 2023-12-29 NOTE — Progress Notes (Signed)
 [facility]  Patient Active Problem List   Diagnosis Date Noted   Hypokalemia 12/15/2023   Obesity, class 3 12/14/2023   Other ascites 12/10/2023   Abscess of abdominal cavity (HCC) 12/10/2023   Uterine leiomyoma 12/10/2023   DM2 (diabetes mellitus, type 2) (HCC) 12/06/2023   Sepsis (HCC) 12/06/2023   Acute hypoxic respiratory failure (HCC) 12/06/2023   Acute on chronic diastolic (congestive) heart failure (HCC) 12/06/2023   Pneumonia 12/06/2023   Fibroid 12/04/2023   Persistent cough 11/20/2023   Peripheral edema 11/20/2023   Chronic pain of both ankles 10/01/2023   Allergic rhinitis with postnasal drip 10/01/2023   Class 3 severe obesity due to excess calories with serious comorbidity and body mass index (BMI) of 45.0 to 49.9 in adult 03/21/2023   Pure hypercholesterolemia 03/21/2023   Coronary artery calcification 03/21/2023   Enlarged liver 03/21/2023   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   MRN: [id number]  Chief Complaint  Patient presents with   Follow-up    HPI:  Amanda Paul is a 48 y.o. female here for hospital f/u of intraabdominal abscess   12/29/23 id clinic f/u See a&p for detail   Allergies  Allergen Reactions   Amoxicillin Hives, Itching and Swelling    Swollen eyes and throat.   Doxycycline  Hyclate Swelling    Bilateral lower leg swelling   Penicillins Hives and Swelling      Outpatient Medications Prior to Visit  Medication Sig Dispense Refill   benzonatate  (TESSALON  PERLES) 100 MG capsule Take 1 capsule (100 mg total) by mouth 3 (three) times daily as needed for cough. 30 capsule 0   cetirizine (ZYRTEC) 10 MG tablet Take 10 mg by mouth daily.     Cholecalciferol  (VITAMIN D ) 50 MCG (2000 UT) CAPS Take 2,000 Units by mouth at bedtime.     ferrous sulfate  325 (65 FE) MG tablet Take 1 tablet (325 mg total) by mouth every other day. 30 tablet 0   ibuprofen  (ADVIL ) 600 MG tablet Take 1 tablet (600 mg total) by mouth every 6 (six) hours as needed. 30 tablet 0   levofloxacin  (LEVAQUIN ) 750 MG tablet Take 1 tablet (750 mg total) by mouth daily. 21 tablet 0   losartan  (COZAAR ) 25 MG tablet Take 1 tablet (25 mg total) by mouth daily. 30 tablet 5   metroNIDAZOLE  (FLAGYL ) 500 MG tablet Take 1 tablet (500 mg total) by mouth every 12 (twelve) hours for 21 days. 42 tablet 0   Multiple Vitamins-Minerals (MULTIVITAMIN WITH MINERALS) tablet Take 1 tablet by mouth daily.     potassium chloride  SA (KLOR-CON  M) 20 MEQ tablet Take 1 tablet (20 mEq total) by mouth daily. 90 tablet 3   rosuvastatin  (CRESTOR ) 20 MG tablet Take 1 tablet (20 mg total) by mouth daily. 90 tablet 1   sodium chloride  flush (NS) 0.9 % SOLN Please flush drain one per day with 5mL of prefilled normal saline syringe 300 mL 0   spironolactone  (ALDACTONE ) 25 MG tablet Take 1 tablet (25 mg total) by mouth daily. 30 tablet 0   torsemide  (DEMADEX ) 20 MG tablet Take 2 tablets (40 mg total) by mouth daily. 60 tablet 0   meloxicam  (  MOBIC ) 15 MG tablet TAKE 1 TABLET (15 MG TOTAL) BY MOUTH DAILY. (Patient not taking: Reported on 12/26/2023) 30 tablet 3   No facility-administered medications prior to visit.     Social History   Socioeconomic History   Marital status: Single    Spouse name: Not on file   Number of children: Not on file   Years of education: Not on file   Highest education level: Some college, no degree  Occupational History   Not on file  Tobacco Use   Smoking status: Never   Smokeless tobacco: Never  Vaping Use   Vaping status: Never Used  Substance and Sexual Activity   Alcohol use: Not Currently    Alcohol/week: 1.0 standard drink of alcohol    Types: 1 Cans of beer per week     Comment: rare   Drug use: No   Sexual activity: Not Currently  Other Topics Concern   Not on file  Social History Narrative   Marital status: single      Children: none      Lives: alone      Employment:  Therapist, occupational and Airline pilot         Social Drivers of Health   Financial Resource Strain: Low Risk  (11/16/2023)   Overall Financial Resource Strain (CARDIA)    Difficulty of Paying Living Expenses: Not hard at all  Food Insecurity: No Food Insecurity (12/05/2023)   Hunger Vital Sign    Worried About Running Out of Food in the Last Year: Never true    Ran Out of Food in the Last Year: Never true  Transportation Needs: No Transportation Needs (12/05/2023)   PRAPARE - Administrator, Civil Service (Medical): No    Lack of Transportation (Non-Medical): No  Physical Activity: Insufficiently Active (11/16/2023)   Exercise Vital Sign    Days of Exercise per Week: 1 day    Minutes of Exercise per Session: 10 min  Stress: No Stress Concern Present (11/16/2023)   [employer] of Occupational Health - Occupational Stress Questionnaire    Feeling of Stress: Only a little  Social Connections: Moderately Isolated (11/16/2023)   Social Connection and Isolation Panel    Frequency of Communication with Friends and Family: More than three times a week    Frequency of Social Gatherings with Friends and Family: Three times a week    Attends Religious Services: 1 to 4 times per year    Active Member of Clubs or Organizations: No    Attends Banker Meetings: Not on file    Marital Status: Never married  Intimate Partner Violence: Unknown (12/12/2023)   Humiliation, Afraid, Rape, and Kick questionnaire    Fear of Current or Ex-Partner: Not on file    Emotionally Abused: No    Physically Abused: Not on file    Sexually Abused: Not on file      Review of Systems     Objective:    BP 127/83   Pulse (!) 104   Temp 97.9 F (36.6 C) (Temporal)   Ht 5' 5 (1.651 m)   Wt  243 lb (110.2 kg) Comment: pt reported  LMP 09/14/2023   SpO2 98%   BMI 40.44 kg/m  Nursing note and vital signs reviewed.  Physical Exam     General/constitutional: no distress, pleasant HEENT: Normocephalic, PER, Conj Clear, EOMI, Oropharynx clear Neck supple CV: rrr no mrg Lungs: clear to auscultation, normal respiratory effort Abd: Soft, Nontender Ext: no  edema Skin: No Rash Neuro: nonfocal MSK: no peripheral joint swelling/tenderness/warmth; back spines nontender    Labs: Lab Results  Component Value Date   WBC 8.7 12/21/2023   HGB 10.1 (L) 12/21/2023   HCT 33.6 (L) 12/21/2023   MCV 83.2 12/21/2023   PLT 766 (H) 12/21/2023   Last metabolic panel Lab Results  Component Value Date   GLUCOSE 120 (H) 12/21/2023   NA 136 12/21/2023   K 3.0 (L) 12/21/2023   CL 95 (L) 12/21/2023   CO2 30 12/21/2023   BUN 14 12/21/2023   CREATININE 0.59 12/21/2023   GFRNONAA >60 12/21/2023   CALCIUM  9.0 12/21/2023   PHOS 2.9 12/17/2023   PROT 6.8 12/11/2023   ALBUMIN  2.0 (L) 12/17/2023   LABGLOB 4.2 09/20/2023   AGRATIO 1.3 09/15/2022   BILITOT 0.4 12/11/2023   ALKPHOS 53 12/11/2023   AST 16 12/11/2023   ALT 12 12/11/2023   ANIONGAP 11 12/21/2023    Micro:  Serology:  Imaging: Reviewed  12/26/23 abd pelv ct Significant decrease in size of right subdiaphragmatic fluid collection, with percutaneous drainage catheter in place.   Decreased right lower lobe atelectasis.   Colonic diverticulosis, without radiographic evidence of diverticulitis.   Small fat-containing paraumbilical ventral hernia.   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  9/15 sinus tract study No fistula identified    Assessment & Plan:   Problem List Items Addressed This Visit   None Visit Diagnoses       Intra-abdominal abscess (HCC)    -  Primary         No orders of the defined types were placed in this encounter.  Abx: 12/06/23-c vanc/cefepime /flagyl  -->  levo/flagyl   ASSESSMENT: Amanda Paul is a 48 y.o. female with inraabd abscess here for f/u.   Sepsis, intra-abdominal abscesses Necrotic uterine fibroid, uterine rupture, secondary peritonitis - Group B Streptococcus -  S/P TAH/BSO 8/24 -  Drain output appears decreased / minimal. Will need repeated imaging prior to further decisions on duration of antibiotics - to be done today. Pathology is benign. She feels much improved and holding down medications. I think we can switch to oral Levaquin  + metronidazole  if her CT scan comes back reassuring for adequate drainage.  - switch to levaquin  and metronidazole   - will arrange for 2 week follow up to see what's going on with drain and fluid collection as she will be followed up with IR drain clinic   Pleural Effusion / Pneumonia -  Oxygen is off today. No significant cough or SOB.  Received 5 days of broader coverage with cefepime /vancomycin .  If WBC remains elevated and back on supplemental O2 may need to consider chest imaging. CHF/ fluid overload and hypoalbuminemia have also contributed to this picture.    PCN Allergy Hx -  She describes a fairly immediate reaction to a derivative of amoxicillin 20 years ago. This did not result in hospitalization but she recalls being told to take benadryl  when she went back to the pharmacy for help. Given more type 1 reaction described we recommended skin testing to further delineate. We are unable to offer this now as she is on histamine blockers.  - Would benefit from outpatient allergy/asthma referral after acute problems resolve.    ID will sign off - please call back with any questions/concerns or if we can be of further assistance.     ----------- 12/29/23 id assessment Patient had sinus and abd ct 9/15; improved size abscess although was still 8 cm in biggest dimension.  Due to drain not putting out much and no fistula tract was found the drain was removed  Still generalized weakness  from admission but improving. 50 pound deficit still since admission  Eating well No fever chill No n/v/d No rash No myalgia/arthralgia  Saw gynecology today  Abdomen nontender   -finish 3 more weeks antibiotics -drain is out and 4-6 weeks abx a school of thought would suggest a sterilized fluid collection if any remains -only way to tell if its's still active is to stop abx and see and after tx of 4-6 weeks -2 more weeks levo/flagyl  given -labs next visit in aound 5 weeks    Follow-up: Return in about 5 weeks (around 02/02/2024).      Constance ONEIDA Passer, MD [facility] 12/29/2023, 3:29 PM

## 2023-12-29 NOTE — Telephone Encounter (Signed)
 Patient called to make FMLA payment over the phone.

## 2023-12-29 NOTE — Patient Instructions (Signed)
 Let's finish 3 more weeks of antibiotics   See me about 5 weeks from now   The only way to tell if the fluid collection left is sterile or not, is to observe off antibiotics and generally after 4-6 weeks of abx treatment

## 2024-01-01 DIAGNOSIS — I517 Cardiomegaly: Secondary | ICD-10-CM | POA: Insufficient documentation

## 2024-01-01 DIAGNOSIS — I110 Hypertensive heart disease with heart failure: Secondary | ICD-10-CM | POA: Insufficient documentation

## 2024-01-01 NOTE — Assessment & Plan Note (Signed)
 Chronic, LDL goal is less than 70 due to coronary arterial calcifications. She will continue with rosuvastatin , dose increased to 20mg . Encouraged to follow heart healthy lifestyle.

## 2024-01-01 NOTE — Assessment & Plan Note (Signed)
 Cardiomegaly with diastolic dysfunction under evaluation for pulmonary hypertension. Echocardiogram scheduled. On high-dose diuretics and losartan  for cardiac and renal benefits. - Continue torsemide  and spironolactone . - Start losartan . - Schedule repeat echocardiogram on October 22nd.

## 2024-01-01 NOTE — Assessment & Plan Note (Signed)
 Calcium  score of 54 and elevated LP(a) suggest coronary artery disease. Trivial mitral valve regurgitation and regional wall motion abnormalities noted on echo. - She will continue with rosuvastatin  - Follow heart healthy lifestyle. - Unfortunately, she missed her Cardiology f/u due to hospitalization

## 2024-01-01 NOTE — Assessment & Plan Note (Signed)
 Hypertension managed with losartan , providing cardiac and renal benefits. - Continue losartan .

## 2024-01-01 NOTE — Assessment & Plan Note (Signed)
 Postoperative, she developed pneumonia.  She was treated with vancomycin  and cefepime . - She is encouraged to stop eating 3 hrs prior to lying down - ID input is appreciated, encouraged to keep upconing ID appt

## 2024-01-01 NOTE — Assessment & Plan Note (Signed)
 BMI 40.  She lost 50 lbs during her hospitalization. She is encouraged to gradually increase her protein intake. Increase activity as tolerated once cleared by GYN.

## 2024-01-01 NOTE — Assessment & Plan Note (Addendum)
 TCM PERFORMED.  Unfortunately, no TCM call performed w/in 48 hours.  DISCHARGE SUMMARY WAS REVIEWED IN FULL DETAIL DURING THE VISIT. MEDS RECONCILED AND COMPARED TO DISCHARGE MEDS. MEDICATION LIST WAS UPDATED AND REVIEWED WITH THE PATIENT. GREATER THAN 50% FACE TO FACE TIME WAS SPENT IN COUNSELING AND COORDINATION OF CARE. ALL QUESTIONS WERE ANSWERED TO THE SATISFACTION OF THE PATIENT.   Postoperative recovery complicated by aspiration pneumonia, treated with antibiotics. Pain controlled, ambulating well, appetite and hydration improving. - Ensure adequate protein intake. - Continue oral pain management as needed. - Follow up with GYN for return to work clearance.

## 2024-01-01 NOTE — Assessment & Plan Note (Signed)
 She is s/p hysterectomy.

## 2024-01-02 ENCOUNTER — Ambulatory Visit (HOSPITAL_COMMUNITY)
Admission: RE | Admit: 2024-01-02 | Discharge: 2024-01-02 | Disposition: A | Discharge status: Home / Self Care | Source: Ambulatory Visit | Attending: Cardiology | Admitting: Cardiology

## 2024-01-02 DIAGNOSIS — I5032 Chronic diastolic (congestive) heart failure: Secondary | ICD-10-CM | POA: Diagnosis present

## 2024-01-02 LAB — BASIC METABOLIC PANEL WITH GFR
Anion gap: 10 (ref 5–15)
BUN: 13 mg/dL (ref 6–20)
CO2: 28 mmol/L (ref 22–32)
Calcium: 9.3 mg/dL (ref 8.9–10.3)
Chloride: 98 mmol/L (ref 98–111)
Creatinine, Ser: 0.6 mg/dL (ref 0.44–1.00)
GFR, Estimated: >60 mL/min (ref >60)
Glucose, Bld: 97 mg/dL (ref 70–99)
Potassium: 3.3 mmol/L — ABNORMAL LOW (ref 3.5–5.1)
Sodium: 136 mmol/L (ref 135–145)

## 2024-01-03 ENCOUNTER — Ambulatory Visit (HOSPITAL_COMMUNITY): Payer: Self-pay | Admitting: Family Medicine

## 2024-01-03 DIAGNOSIS — I5032 Chronic diastolic (congestive) heart failure: Secondary | ICD-10-CM

## 2024-01-05 ENCOUNTER — Encounter: Payer: Self-pay | Admitting: Internal Medicine

## 2024-01-09 ENCOUNTER — Encounter: Payer: Self-pay | Admitting: Obstetrics and Gynecology

## 2024-01-09 MED ORDER — TORSEMIDE 20 MG PO TABS: 40.0000 mg | ORAL_TABLET | Freq: Every day | ORAL | 0 refills | Status: DC

## 2024-01-09 MED ORDER — SPIRONOLACTONE 25 MG PO TABS: 25.0000 mg | ORAL_TABLET | Freq: Every day | ORAL | 0 refills | Status: DC

## 2024-01-11 ENCOUNTER — Ambulatory Visit (HOSPITAL_COMMUNITY)
Admission: RE | Admit: 2024-01-11 | Discharge: 2024-01-11 | Disposition: A | Discharge status: Home / Self Care | Source: Ambulatory Visit | Attending: Cardiology | Admitting: Cardiology

## 2024-01-11 DIAGNOSIS — I5032 Chronic diastolic (congestive) heart failure: Secondary | ICD-10-CM | POA: Insufficient documentation

## 2024-01-11 LAB — PULMONARY FUNCTION TEST
DL/VA % pred: 100 %
DL/VA: 4.3 ml/min/mmHg/L
DLCO unc % pred: 53 %
DLCO unc: 11.71 ml/min/mmHg
FEF 25-75 Post: 1.59 L/s
FEF 25-75 Pre: 1.49 L/s
FEF2575-%Change-Post: 7 %
FEF2575-%Pred-Post: 56 %
FEF2575-%Pred-Pre: 52 %
FEV1-%Change-Post: 1 %
FEV1-%Pred-Post: 55 %
FEV1-%Pred-Pre: 54 %
FEV1-Post: 1.6 L
FEV1-Pre: 1.58 L
FEV1FVC-%Change-Post: 1 %
FEV1FVC-%Pred-Pre: 102 %
FEV6-%Change-Post: 0 %
FEV6-%Pred-Post: 53 %
FEV6-%Pred-Pre: 53 %
FEV6-Post: 1.92 L
FEV6-Pre: 1.92 L
FEV6FVC-%Pred-Post: 102 %
FEV6FVC-%Pred-Pre: 102 %
FVC-%Change-Post: 0 %
FVC-%Pred-Post: 52 %
FVC-%Pred-Pre: 52 %
FVC-Post: 1.92 L
FVC-Pre: 1.92 L
Post FEV1/FVC ratio: 83 %
Post FEV6/FVC ratio: 100 %
Pre FEV1/FVC ratio: 82 %
Pre FEV6/FVC Ratio: 100 %
RV % pred: 68 %
RV: 1.26 L
TLC % pred: 59 %
TLC: 3.09 L

## 2024-01-11 MED ORDER — ALBUTEROL SULFATE (2.5 MG/3ML) 0.083% IN NEBU
2.5000 mg | INHALATION_SOLUTION | Freq: Once | RESPIRATORY_TRACT | Status: AC
  Administered 2024-01-11: 2.5 mg via RESPIRATORY_TRACT

## 2024-01-12 ENCOUNTER — Other Ambulatory Visit: Payer: Self-pay

## 2024-01-12 ENCOUNTER — Encounter: Payer: Self-pay | Admitting: Obstetrics and Gynecology

## 2024-01-12 ENCOUNTER — Ambulatory Visit: Admitting: Obstetrics and Gynecology

## 2024-01-12 VITALS — BP 137/81 | HR 118 | Wt 236.0 lb

## 2024-01-12 DIAGNOSIS — Z09 Encounter for follow-up examination after completed treatment for conditions other than malignant neoplasm: Secondary | ICD-10-CM

## 2024-01-12 NOTE — Progress Notes (Signed)
 GYNECOLOGY OFFICE VISIT NOTE  History:  Amanda Paul is a 48 y.o. G0P0000 here today for postop follow up from TAH/BSO that was done emergently as she was unstable after her uterus ruptured releasing necrotic fluid into her abdomen.   Her postop course was complicated by infection likely due to the fluid that her ruptured into her abdomen.   Today, she is doing well. Not pain. Not sexually active. Getting hot at night but doing lifestyle measures for now.    Past Medical History:  Diagnosis Date   Allergy    Arthritis    Hernia, umbilical    Hyperlipidemia    Hypertension    Low grade squamous intraepithelial lesion (LGSIL) on cervicovaginal cytologic smear 06/10/2000   Morbid obesity (HCC)    Pain in right hand 01/19/2019   Prediabetes 03/21/2023   Umbilical hernia 04/14/2012   Uterine fibroids     Past Surgical History:  Procedure Laterality Date   ADVANCEMENT / RECONSTRUCTION POSTERIOR TIBIAL TENDON / BENAY Left 10/09/2021   COLONOSCOPY WITH PROPOFOL  N/A 06/13/2020   Procedure: COLONOSCOPY WITH PROPOFOL ;  Surgeon: Rollin Dover, MD;  Location: [facility];  Service: Endoscopy;  Laterality: N/A;   HEMOSTASIS CLIP PLACEMENT  06/13/2020   Procedure: HEMOSTASIS CLIP PLACEMENT;  Surgeon: Rollin Dover, MD;  Location: [facility];  Service: Endoscopy;;   HERNIA REPAIR     HYSTERECTOMY ABDOMINAL WITH SALPINGO-OOPHORECTOMY N/A 12/04/2023   Procedure: HYSTERECTOMY, ABDOMINAL, WITH SALPINGO-OOPHORECTOMY;  Surgeon: Jayne Vonn DEL, MD;  Location: [facility] OR;  Service: Gynecology;  Laterality: N/A;   HYSTEROSCOPY WITH D & C N/A 08/26/2014   Procedure: DILATATION AND CURETTAGE /HYSTEROSCOPY and cervical repair of cervical laceration ;  Surgeon: Nena App, MD;  Location: [facility]S;  Service: Gynecology;  Laterality: N/A;   IR ANGIOGRAM PELVIS SELECTIVE OR SUPRASELECTIVE  10/08/2020   IR ANGIOGRAM PELVIS SELECTIVE OR SUPRASELECTIVE  10/08/2020   IR ANGIOGRAM SELECTIVE EACH  ADDITIONAL VESSEL  10/08/2020   IR EMBO TUMOR ORGAN ISCHEMIA INFARCT INC GUIDE ROADMAPPING  10/08/2020   IR RADIOLOGIST EVAL & MGMT  07/10/2020   IR RADIOLOGIST EVAL & MGMT  07/31/2020   IR RADIOLOGIST EVAL & MGMT  09/09/2020   IR RADIOLOGIST EVAL & MGMT  11/18/2020   IR RADIOLOGIST EVAL & MGMT  09/14/2021   IR RADIOLOGIST EVAL & MGMT  12/26/2023   IR US  GUIDE VASC ACCESS RIGHT  10/08/2020   POLYPECTOMY  06/13/2020   Procedure: POLYPECTOMY;  Surgeon: Rollin Dover, MD;  Location: [facility];  Service: Endoscopy;;   UMBILICAL HERNIA REPAIR     WISDOM TOOTH EXTRACTION      The following portions of the patient's history were reviewed and updated as appropriate: allergies, current medications, past family history, past medical history, past social history, past surgical history and problem list.   Health Maintenance:   Normal pap and negative HRHPV:   Diagnosis  Date Value Ref Range Status  06/24/2021   Final   - Negative for intraepithelial lesion or malignancy (NILM)  Had an abnormal years ago.   Review of Systems:  Pertinent items noted in HPI and remainder of comprehensive ROS otherwise negative.  Physical Exam:  BP 137/81   Pulse (!) 118   Wt 236 lb (107 kg)   LMP 09/14/2023   BMI 39.27 kg/m  CONSTITUTIONAL: Well-developed, well-nourished female in no acute distress.  HEENT:  Normocephalic, atraumatic. External right and left ear normal. No scleral icterus.  NECK: Normal range of motion, supple, no masses noted  on observation SKIN: No rash noted. Not diaphoretic. No erythema. No pallor. MUSCULOSKELETAL: Normal range of motion. No edema noted. NEUROLOGIC: Alert and oriented to person, place, and time. Normal muscle tone coordination. No cranial nerve deficit noted. PSYCHIATRIC: Normal mood and affect. Normal behavior. Normal judgment and thought content.  ABDOMEN: No masses noted. No other overt distention noted.  Incision completely healed.   PELVIC:  Deferred  Assessment and Plan:  1. Postop check (Primary) Healing well. Restrictions lifted except heavy lifting >25 lbs. Work note given. Discussed s/sx of menopause and if bothersome, have multiple options. She can now follow  up with Dr. Darcel or myself for ongoing care.  Reviewed based on pap history, she no longer must have pap smears. (Multiple normals in the last 10 years and no history of CIN3).    No orders of the defined types were placed in this encounter.    Routine preventative health maintenance measures emphasized. Please refer to After Visit Summary for other counseling recommendations.   Return if symptoms worsen or fail to improve.  Vina Solian, MD, FACOG Obstetrician & Gynecologist, [facility] for [facility], [facility]

## 2024-01-13 ENCOUNTER — Encounter: Payer: Self-pay | Admitting: Internal Medicine

## 2024-01-17 ENCOUNTER — Other Ambulatory Visit: Payer: Self-pay | Admitting: Obstetrics and Gynecology

## 2024-01-19 ENCOUNTER — Ambulatory Visit (HOSPITAL_COMMUNITY)
Admission: RE | Admit: 2024-01-19 | Discharge: 2024-01-19 | Disposition: A | Discharge status: Home / Self Care | Source: Ambulatory Visit | Attending: Cardiology | Admitting: Cardiology

## 2024-01-19 DIAGNOSIS — I5032 Chronic diastolic (congestive) heart failure: Secondary | ICD-10-CM

## 2024-01-27 ENCOUNTER — Telehealth (HOSPITAL_COMMUNITY): Payer: Self-pay | Admitting: Cardiology

## 2024-01-27 NOTE — Telephone Encounter (Signed)
 Called to confirm/remind patient of their appointment at the [facility] on 01/27/2024.   Appointment:   [x] Confirmed  [] Left mess   [] No answer/No voice mail  [] VM Full/unable to leave message  [] Phone not in service  Patient reminded to bring all medications and/or complete list.  Confirmed patient has transportation. Gave directions, instructed to utilize valet parking.

## 2024-01-30 ENCOUNTER — Ambulatory Visit (HOSPITAL_COMMUNITY)
Admission: RE | Admit: 2024-01-30 | Discharge: 2024-01-30 | Disposition: A | Discharge status: Home / Self Care | Source: Ambulatory Visit | Attending: Cardiology | Admitting: Cardiology

## 2024-01-30 ENCOUNTER — Encounter (HOSPITAL_COMMUNITY): Payer: Self-pay | Admitting: Cardiology

## 2024-01-30 ENCOUNTER — Ambulatory Visit (HOSPITAL_COMMUNITY): Payer: Self-pay | Admitting: Cardiology

## 2024-01-30 ENCOUNTER — Ambulatory Visit (HOSPITAL_BASED_OUTPATIENT_CLINIC_OR_DEPARTMENT_OTHER)
Admission: RE | Admit: 2024-01-30 | Discharge: 2024-01-30 | Disposition: A | Discharge status: Home / Self Care | Source: Ambulatory Visit | Attending: Cardiology | Admitting: Cardiology

## 2024-01-30 VITALS — BP 116/80 | HR 74 | Ht 65.0 in | Wt 245.0 lb

## 2024-01-30 DIAGNOSIS — E785 Hyperlipidemia, unspecified: Secondary | ICD-10-CM | POA: Diagnosis not present

## 2024-01-30 DIAGNOSIS — I110 Hypertensive heart disease with heart failure: Secondary | ICD-10-CM | POA: Insufficient documentation

## 2024-01-30 DIAGNOSIS — I509 Heart failure, unspecified: Secondary | ICD-10-CM

## 2024-01-30 DIAGNOSIS — I2510 Atherosclerotic heart disease of native coronary artery without angina pectoris: Secondary | ICD-10-CM | POA: Diagnosis not present

## 2024-01-30 DIAGNOSIS — Z8249 Family history of ischemic heart disease and other diseases of the circulatory system: Secondary | ICD-10-CM | POA: Insufficient documentation

## 2024-01-30 DIAGNOSIS — Z6839 Body mass index (BMI) 39.0-39.9, adult: Secondary | ICD-10-CM

## 2024-01-30 DIAGNOSIS — I50810 Right heart failure, unspecified: Secondary | ICD-10-CM

## 2024-01-30 DIAGNOSIS — I5032 Chronic diastolic (congestive) heart failure: Secondary | ICD-10-CM

## 2024-01-30 DIAGNOSIS — I10 Essential (primary) hypertension: Secondary | ICD-10-CM

## 2024-01-30 DIAGNOSIS — I2729 Other secondary pulmonary hypertension: Secondary | ICD-10-CM | POA: Diagnosis not present

## 2024-01-30 DIAGNOSIS — E66812 Obesity, class 2: Secondary | ICD-10-CM

## 2024-01-30 DIAGNOSIS — I272 Pulmonary hypertension, unspecified: Secondary | ICD-10-CM

## 2024-01-30 LAB — BASIC METABOLIC PANEL WITH GFR
Anion gap: 12 (ref 5–15)
BUN: 16 mg/dL (ref 6–20)
CO2: 28 mmol/L (ref 22–32)
Calcium: 9.3 mg/dL (ref 8.9–10.3)
Chloride: 98 mmol/L (ref 98–111)
Creatinine, Ser: 0.66 mg/dL (ref 0.44–1.00)
GFR, Estimated: >60 mL/min (ref >60)
Glucose, Bld: 81 mg/dL (ref 70–99)
Potassium: 3.2 mmol/L — ABNORMAL LOW (ref 3.5–5.1)
Sodium: 138 mmol/L (ref 135–145)

## 2024-01-30 LAB — ECHOCARDIOGRAM COMPLETE
Area-P 1/2: 3.21 cm2
S' Lateral: 3.16 cm
Single Plane A4C EF: 59.8 %

## 2024-01-30 MED ORDER — TORSEMIDE 20 MG PO TABS: 20.0000 mg | ORAL_TABLET | Freq: Every day | ORAL | 3 refills | Status: AC

## 2024-01-30 NOTE — Patient Instructions (Signed)
 CHANGE Torsemide  to 20 mg daily.  Your physician recommends that you schedule a follow-up appointment in: 6 months ( April 2026) ** PLEASE CALL THE OFFICE IN FEBRUARY 2026 TO ARRANGE YOUR FOLLOW UP APPOINTMENT.**  If you have any questions or concerns before your next appointment please send us  a message through [facility] or call our office at [phone].    TO LEAVE A MESSAGE FOR THE NURSE SELECT OPTION 2, PLEASE LEAVE A MESSAGE INCLUDING: YOUR NAME DATE OF BIRTH CALL BACK NUMBER REASON FOR CALL**this is important as we prioritize the call backs  YOU WILL RECEIVE A CALL BACK THE SAME DAY AS LONG AS YOU CALL BEFORE 4:00 PM  At the [facility], you and your health needs are our priority. As part of our continuing mission to provide you with exceptional heart care, we have created designated Provider Care Teams. These Care Teams include your primary Cardiologist (physician) and Advanced Practice Providers (APPs- Physician Assistants and Nurse Practitioners) who all work together to provide you with the care you need, when you need it.   You may see any of the following providers on your designated Care Team at your next follow up: Dr Toribio Fuel Dr Ezra Shuck Dr. Ria Commander Dr. [name] Amy Lenetta, NP Caffie Shed, [place] [facility] [place], [place] Beckey Coe, NP [place] Lee, NP Ellouise Class, NP Tinnie Redman, PharmD Jaun Bash, PharmD   Please be sure to bring in all your medications bottles to every appointment.    Thank you for choosing [facility]

## 2024-01-30 NOTE — Progress Notes (Signed)
  Echocardiogram 2D Echocardiogram has been performed.  Koleen KANDICE Popper, RDCS 01/30/2024, 10:42 AM

## 2024-02-02 NOTE — Progress Notes (Signed)
   ADVANCED HEART FAILURE NEW PATIENT CLINIC NOTE  Referring Physician: Jarold Medici, MD  Primary Care: Jarold Medici, MD Primary Cardiologist:  HPI: Amanda Paul is a 48 y.o. female with a PMH of morbid obesity, CAC, hypertension who presents for initial visit for further evaluation and treatment of heart failure/cardiomyopathy.      She has significant family history of heart disease. Mother has heart failure and severe COPD and he father had a mild heart attack 2 years ago.   Admitted 12/06/23 for acute abdominal pain and found to have necrotic fibroid uterus with rupture s/p TAH/BSO c/b chemical peritonitis s/p drain placement and IV antibiotics. Also complicated by new CHF, pneumonia, and hypoxia. Echo at the beginning of the admission showed new, mild RV dysfunction with overload, as well as moderately elevated pulmonary artery pressures. CTPE 8/25 inconclusive for assessing for PE. V/Q scan 9/4 negative. Drain remain in place on DOD, she was sent with 3 weeks PO abx.      SUBJECTIVE:  Patient overall reports that she has been feeling much better since her hospitalization.  She denies any recent worsening lower extremity swelling, orthopnea, PND.  We discussed her echocardiogram results that showed a normal ejection fraction, grade 2 diastolic dysfunction, and now normal RV.  Has been doing well with medical therapy.  PMH, current medications, allergies, social history, and family history reviewed in epic.  PHYSICAL EXAM: Vitals:   01/30/24 1046  BP: 116/80  Pulse: 74  SpO2: 98%   GENERAL: Well nourished and in no apparent distress at rest.  PULM:  Normal work of breathing, clear to auscultation bilaterally. Respirations are unlabored.  CARDIAC:  JVP: Flat         Normal rate with regular rhythm. No murmurs, rubs or gallops.  Trace edema. Warm and well perfused extremities. ABDOMEN: Soft, non-tender, non-distended. NEUROLOGIC: Patient is oriented x3 with no  focal or lateralizing neurologic deficits.    DATA REVIEW  ECG: 11/2023: sinus tachycardia, normal QRS    ECHO: 01/30/2024: LVEF 55-60%, grade II DD, normal RV 12/06/2023:   CATH: None    ASSESSMENT & PLAN:  Pulmonary hypertesion/cor pulmonale: Unclear etiology behind decompensation, especially given her rapid recovery. Could certainly been due to acute on chronic HFpEF compounded by respiratory illness, no evidence of PE. Improved today. PFTs with moderate-severe restriction, obstruction of flow rate, reduced DLCO. - Continue diuretics as below - Sleep study pending - PFTs reviewed, would benefit from pulmonary referral, scheduled in 11/25 - Potentially further chest imaging at that time - Autoimmune workup largely benign - Can hold on RHC given improved echo, symptoms  Chronic HFpEF: Appears euvolemic, stable NYHA class II. Cardiometabolic. - Decrease torsemide  to 20mg  daily -  Continue torsemide  20mg  daily - SGLT2 at next visit - Continue spironolactone  25mg  daily  HTN:  - Well controlled today  HLD:  - Continue crestor  20mg  daily, CAC score 54  Obesity: BMI 40.77 - Sleep study  Follow up in 6 months  Amanda Brownie, MD [facility] Mechanical Circulatory Support 02/02/24

## 2024-02-07 ENCOUNTER — Encounter: Payer: Self-pay | Admitting: Cardiology

## 2024-02-07 ENCOUNTER — Encounter: Payer: Self-pay | Admitting: Internal Medicine

## 2024-02-07 ENCOUNTER — Other Ambulatory Visit: Payer: Self-pay

## 2024-02-07 ENCOUNTER — Ambulatory Visit (INDEPENDENT_AMBULATORY_CARE_PROVIDER_SITE_OTHER): Admitting: Internal Medicine

## 2024-02-07 VITALS — BP 137/79 | HR 76 | Wt 247.0 lb

## 2024-02-07 DIAGNOSIS — K651 Peritoneal abscess: Secondary | ICD-10-CM

## 2024-02-07 NOTE — Patient Instructions (Signed)
 It appears you are free of the infection. Another 4-6 weeks feeling as good like this or better would be confirmation    Lab crp today    Please make appointment 6 weeks from now. If no fever, chill, nausea, vomiting, abdominal pain, or malaise within the next 6 weeks then you can cancel appointment

## 2024-02-07 NOTE — Progress Notes (Signed)
 [facility]  Patient Active Problem List   Diagnosis Date Noted   Cardiomegaly 01/01/2024   Hypertensive heart disease with acute on chronic diastolic congestive heart failure (HCC) 01/01/2024   Hypokalemia 12/15/2023   Obesity, class 3 (HCC) 12/14/2023   Abscess of abdominal cavity (HCC) 12/10/2023   DM2 (diabetes mellitus, type 2) (HCC) 12/06/2023   Sepsis (HCC) 12/06/2023   Acute hypoxic respiratory failure (HCC) 12/06/2023   Acute on chronic diastolic (congestive) heart failure (HCC) 12/06/2023   Pneumonia 12/06/2023   Persistent cough 11/20/2023   Peripheral edema 11/20/2023   Chronic pain of both ankles 10/01/2023   Allergic rhinitis with postnasal drip 10/01/2023   Class 3 severe obesity due to excess calories with body mass index (BMI) of 40.0 to 44.9 in adult [facility]) 03/21/2023   Pure hypercholesterolemia 03/21/2023   Coronary artery calcification 03/21/2023   Enlarged liver 03/21/2023   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   MRN: [id number]  No chief complaint on file.   HPI:  Amanda Paul is a 47 y.o. female here for hospital f/u of intraabdominal abscess   02/07/24 id clinic f/u See a&p for detail   Allergies  Allergen Reactions   Amoxicillin Hives, Itching and Swelling    Swollen eyes and throat.   Doxycycline  Hyclate Swelling    Bilateral lower leg swelling   Penicillins Hives and Swelling      Outpatient Medications Prior to Visit  Medication Sig Dispense Refill   cetirizine (ZYRTEC) 10 MG tablet Take 10 mg by mouth daily.     Cholecalciferol (VITAMIN D ) 50 MCG (2000 UT) CAPS Take 2,000 Units by mouth at bedtime.     ferrous sulfate  325 (65 FE) MG tablet Take 1 tablet (325 mg  total) by mouth every other day. 30 tablet 0   ibuprofen  (ADVIL ) 600 MG tablet Take 1 tablet (600 mg total) by mouth every 6 (six) hours as needed. 30 tablet 0   losartan  (COZAAR ) 25 MG tablet Take 1 tablet (25 mg total) by mouth daily. 30 tablet 5   Multiple Vitamins-Minerals (MULTIVITAMIN WITH MINERALS) tablet Take 1 tablet by mouth daily.     potassium chloride  SA (KLOR-CON  M) 20 MEQ tablet Take 1 tablet (20 mEq total) by mouth daily. 90 tablet 3   rosuvastatin  (CRESTOR ) 20 MG tablet Take 1 tablet (20 mg total) by mouth daily. 90 tablet 1   spironolactone  (ALDACTONE ) 25 MG tablet Take 1 tablet (25 mg total) by mouth daily. 30 tablet 0   torsemide  (DEMADEX ) 20 MG tablet Take 1 tablet (20 mg total) by mouth daily. 90 tablet 3   benzonatate  (TESSALON  PERLES) 100 MG capsule Take 1 capsule (100 mg total) by mouth 3 (three) times daily as needed for cough. (Patient not taking: Reported on 02/07/2024) 30 capsule 0   sodium chloride  flush (NS) 0.9 % SOLN Please flush drain one per day with 5mL of prefilled normal saline syringe 300 mL 0   No facility-administered medications prior to visit.     Social History   Socioeconomic History   Marital status: Single    Spouse name: Not on file   Number of children: Not on file   Years  of education: Not on file   Highest education level: Some college, no degree  Occupational History   Not on file  Tobacco Use   Smoking status: Never   Smokeless tobacco: Never  Vaping Use   Vaping status: Never Used  Substance and Sexual Activity   Alcohol use: Not Currently    Alcohol/week: 1.0 standard drink of alcohol    Types: 1 Cans of beer per week    Comment: rare   Drug use: No   Sexual activity: Not Currently  Other Topics Concern   Not on file  Social History Narrative   Marital status: single      Children: none      Lives: alone      Employment:  Therapist, Occupational and Air[employer]         Social Drivers of Health   Financial Resource Strain: Low  Risk  (11/16/2023)   Overall Financial Resource Strain (CARDIA)    Difficulty of Paying Living Expenses: Not hard at all  Food Insecurity: No Food Insecurity (01/13/2024)   Hunger Vital Sign    Worried About Running Out of Food in the Last Year: Never true    Ran Out of Food in the Last Year: Never true  Transportation Needs: No Transportation Needs (01/13/2024)   PRAPARE - Administrator, Civil Service (Medical): No    Lack of Transportation (Non-Medical): No  Physical Activity: Insufficiently Active (11/16/2023)   Exercise Vital Sign    Days of Exercise per Week: 1 day    Minutes of Exercise per Session: 10 min  Stress: No Stress Concern Present (11/16/2023)   [employer] of Occupational Health - Occupational Stress Questionnaire    Feeling of Stress: Only a little  Social Connections: Moderately Isolated (11/16/2023)   Social Connection and Isolation Panel    Frequency of Communication with Friends and Family: More than three times a week    Frequency of Social Gatherings with Friends and Family: Three times a week    Attends Religious Services: 1 to 4 times per year    Active Member of Clubs or Organizations: No    Attends Banker Meetings: Not on file    Marital Status: Never married  Intimate Partner Violence: Unknown (12/12/2023)   Humiliation, Afraid, Rape, and Kick questionnaire    Fear of Current or Ex-Partner: Not on file    Emotionally Abused: No    Physically Abused: Not on file    Sexually Abused: Not on file      Review of Systems     Objective:    BP 137/79   Pulse 76   Wt 247 lb (112 kg)   LMP 09/14/2023   SpO2 96%   BMI 41.10 kg/m  Nursing note and vital signs reviewed.  Physical Exam     General/constitutional: no distress, pleasant HEENT: Normocephalic, PER, Conj Clear, EOMI, Oropharynx clear Neck supple CV: rrr no mrg Lungs: clear to auscultation, normal respiratory effort Abd: Soft, Nontender Ext: no edema Skin: No  Rash Neuro: nonfocal MSK: no peripheral joint swelling/tenderness/warmth; back spines nontender    Labs: Lab Results  Component Value Date   WBC 8.7 12/21/2023   HGB 10.1 (L) 12/21/2023   HCT 33.6 (L) 12/21/2023   MCV 83.2 12/21/2023   PLT 766 (H) 12/21/2023   Last metabolic panel Lab Results  Component Value Date   GLUCOSE 81 01/30/2024   NA 138 01/30/2024   K 3.2 (L) 01/30/2024   CL  98 01/30/2024   CO2 28 01/30/2024   BUN 16 01/30/2024   CREATININE 0.66 01/30/2024   GFRNONAA >60 01/30/2024   CALCIUM  9.3 01/30/2024   PHOS 2.9 12/17/2023   PROT 6.8 12/11/2023   ALBUMIN  2.0 (L) 12/17/2023   LABGLOB 4.2 09/20/2023   AGRATIO 1.3 09/15/2022   BILITOT 0.4 12/11/2023   ALKPHOS 53 12/11/2023   AST 16 12/11/2023   ALT 12 12/11/2023   ANIONGAP 12 01/30/2024    Micro:  Serology:  Imaging: Reviewed  12/26/23 abd pelv ct Significant decrease in size of right subdiaphragmatic fluid collection, with percutaneous drainage catheter in place.   Decreased right lower lobe atelectasis.   Colonic diverticulosis, without radiographic evidence of diverticulitis.   Small fat-containing paraumbilical ventral hernia.   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  9/15 sinus tract study No fistula identified    Assessment & Plan:   Problem List Items Addressed This Visit   None Visit Diagnoses       Intra-abdominal abscess (HCC)    -  Primary   Relevant Orders   C-reactive protein          No orders of the defined types were placed in this encounter.  Abx: 12/06/23-c vanc/cefepime /flagyl  --> levo/flagyl   ASSESSMENT: Amanda Paul is a 48 y.o. female with inraabd abscess here for f/u.   Sepsis, intra-abdominal abscesses Necrotic uterine fibroid, uterine rupture, secondary peritonitis - Group B Streptococcus -  S/P TAH/BSO 8/24 -  Drain output appears decreased / minimal. Will need repeated imaging prior to further decisions on duration of antibiotics - to be  done today. Pathology is benign. She feels much improved and holding down medications. I think we can switch to oral Levaquin  + metronidazole  if her CT scan comes back reassuring for adequate drainage.  - switch to levaquin  and metronidazole   - will arrange for 2 week follow up to see what's going on with drain and fluid collection as she will be followed up with IR drain clinic   Pleural Effusion / Pneumonia -  Oxygen is off today. No significant cough or SOB.  Received 5 days of broader coverage with cefepime /vancomycin .  If WBC remains elevated and back on supplemental O2 may need to consider chest imaging. CHF/ fluid overload and hypoalbuminemia have also contributed to this picture.    PCN Allergy Hx -  She describes a fairly immediate reaction to a derivative of amoxicillin 20 years ago. This did not result in hospitalization but she recalls being told to take benadryl  when she went back to the pharmacy for help. Given more type 1 reaction described we recommended skin testing to further delineate. We are unable to offer this now as she is on histamine blockers.  - Would benefit from outpatient allergy/asthma referral after acute problems resolve.    ID will sign off - please call back with any questions/concerns or if we can be of further assistance.     ----------- 12/29/23 id assessment Patient had sinus and abd ct 9/15; improved size abscess although was still 8 cm in biggest dimension. Due to drain not putting out much and no fistula tract was found the drain was removed  Still generalized weakness from admission but improving. 50 pound deficit still since admission  Eating well No fever chill No n/v/d No rash No myalgia/arthralgia  Saw gynecology today  Abdomen nontender   -finish 3 more weeks antibiotics -drain is out and 4-6 weeks abx a school of thought would suggest a sterilized fluid collection if  any remains -only way to tell if its's still active is to stop abx  and see and after tx of 4-6 weeks -2 more weeks levo/flagyl  given -labs next visit in aound 5 weeks    ------------- 02/07/24 id clinic assessment Patient finished 6 weeks abx about 3 weeks ago Patient doing well with good appetite, no gi sx or f/c Asking about getting flu shot if it's ok   Discussed crp to be done today and continue observing herself for another 4-6 weeks. If she continues to do well during the next several weeks then likely the infection is cured   F/u 6 weeks as needed if abd pain, nausea, vomiting, fever, chill, malaise. Or sooner as needed     Follow-up: No follow-ups on file.      Amanda ONEIDA Passer, MD [facility] 02/07/2024, 1:54 PM

## 2024-02-08 LAB — C-REACTIVE PROTEIN: CRP: <3 mg/L (ref <8.0)

## 2024-02-09 ENCOUNTER — Ambulatory Visit: Admitting: Internal Medicine

## 2024-02-10 ENCOUNTER — Other Ambulatory Visit: Payer: Self-pay

## 2024-02-10 ENCOUNTER — Ambulatory Visit (INDEPENDENT_AMBULATORY_CARE_PROVIDER_SITE_OTHER): Admitting: Physician Assistant

## 2024-02-10 ENCOUNTER — Ambulatory Visit: Admitting: Physician Assistant

## 2024-02-10 ENCOUNTER — Encounter: Payer: Self-pay | Admitting: Physician Assistant

## 2024-02-10 DIAGNOSIS — M25561 Pain in right knee: Secondary | ICD-10-CM

## 2024-02-10 MED ORDER — METHYLPREDNISOLONE ACETATE 40 MG/ML IJ SUSP
40.0000 mg | INTRAMUSCULAR | Status: AC | PRN
  Administered 2024-02-10: 40 mg via INTRA_ARTICULAR

## 2024-02-10 MED ORDER — LIDOCAINE HCL 1 % IJ SOLN
4.0000 mL | INTRAMUSCULAR | Status: AC | PRN
  Administered 2024-02-10: 4 mL

## 2024-02-10 NOTE — Progress Notes (Signed)
 Office Visit Note   Patient: Amanda Paul           Date of Birth: January 26, 1976           MRN: [id number] Visit Date: 02/10/2024              Requested by: Jarold Medici, MD [address] [place],  [place] [address] PCP: Jarold Medici, MD   Assessment & Plan: Visit Diagnoses:  1. Acute pain of right knee     Plan: Patient is a pleasant 48 year old woman history of right knee arthritis she recently was at home for several weeks recovering from surgery she went back to her job that requires long periods of standing thinks this may have irritated her knee has had no injury no fever or chills requesting an injection  Follow-Up Instructions: Return if symptoms worsen or fail to improve.   Orders:  Orders Placed This Encounter  Procedures  . Large Joint Inj  . XR Knee 1-2 Views Right   No orders of the defined types were placed in this encounter.     Procedures: Large Joint Inj: R knee on 02/10/2024 9:18 AM Indications: pain and diagnostic evaluation Details: 25 G 1.5 in needle, anterolateral approach  Arthrogram: No  Medications: 40 mg methylPREDNISolone  acetate 40 MG/ML; 4 mL lidocaine  1 % Outcome: tolerated well, no immediate complications Procedure, treatment alternatives, risks and benefits explained, specific risks discussed. Consent was given by the patient.       Clinical Data: No additional findings.   Subjective: Chief Complaint  Patient presents with  . 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  She was off her 7-week she thinks this may have bothered her knee she has had knee injections in the past for her arthritis  Review of Systems  All other systems reviewed and are negative.    Objective: Vital Signs: LMP 09/14/2023   Physical Exam Constitutional:      Appearance: Normal appearance.  Pulmonary:     Effort: Pulmonary effort is  normal.  Skin:    General: Skin is warm and dry.  Neurological:     General: No focal deficit present.     Mental Status: She is alert and oriented to person, place, and time.  Psychiatric:        Mood and Affect: Mood normal.        Behavior: Behavior normal.     Ortho Exam Right knee she has no effusion no erythema she has some grinding with range of motion compartments are soft and nontender negative Homans' sign Specialty Comments:  No specialty comments available.  Imaging: No results found.   PMFS History: Patient Active Problem List   Diagnosis Date Noted  . Cardiomegaly 01/01/2024  . Hypertensive heart disease with acute on chronic diastolic congestive heart failure (HCC) 01/01/2024  . Hypokalemia 12/15/2023  . Obesity, class 3 (HCC) 12/14/2023  . Abscess of abdominal cavity (HCC) 12/10/2023  . DM2 (diabetes mellitus, type 2) (HCC) 12/06/2023  . Sepsis (HCC) 12/06/2023  . Acute hypoxic respiratory failure (HCC) 12/06/2023  . Acute on chronic diastolic (congestive) heart failure (HCC) 12/06/2023  . Pneumonia 12/06/2023  . Persistent cough 11/20/2023  . Peripheral edema 11/20/2023  . Chronic pain of both ankles 10/01/2023  . Allergic rhinitis with postnasal drip 10/01/2023  . Class 3 severe obesity due to excess calories with body  mass index (BMI) of 40.0 to 44.9 in adult [facility]) 03/21/2023  . Pure hypercholesterolemia 03/21/2023  . Coronary artery calcification 03/21/2023  . Enlarged liver 03/21/2023  . 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 History of abnormal cervical Pap smear 07/31/2019  . 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 Hypertension 05/10/2012  . Morbid obesity (HCC)    Past Medical History:  Diagnosis Date  . Allergy   . Arthritis   . Hernia, umbilical   . Hyperlipidemia   . Hypertension   . Low grade squamous intraepithelial lesion (LGSIL) on cervicovaginal cytologic smear 06/10/2000  . Morbid obesity (HCC)   . Pain in right hand 01/19/2019  .  Prediabetes 03/21/2023  . Umbilical hernia 04/14/2012  . Uterine fibroids     Family History  Problem Relation Age of Onset  . Hypertension Mother   . Colon polyps Neg Hx   . Colon cancer Neg Hx   . Esophageal cancer Neg Hx   . Rectal cancer Neg Hx   . Stomach cancer Neg Hx     Past Surgical History:  Procedure Laterality Date  . ADVANCEMENT / RECONSTRUCTION POSTERIOR TIBIAL TENDON / [facility] Left 10/09/2021  . COLONOSCOPY WITH PROPOFOL  N/A 06/13/2020   Procedure: COLONOSCOPY WITH PROPOFOL ;  Surgeon: Rollin Dover, MD;  Location: [facility];  Service: Endoscopy;  Laterality: N/A;  . HEMOSTASIS CLIP PLACEMENT  06/13/2020   Procedure: HEMOSTASIS CLIP PLACEMENT;  Surgeon: Rollin Dover, MD;  Location: [facility];  Service: Endoscopy;;  . HERNIA REPAIR    . HYSTERECTOMY ABDOMINAL WITH SALPINGO-OOPHORECTOMY N/A 12/04/2023   Procedure: HYSTERECTOMY, ABDOMINAL, WITH SALPINGO-OOPHORECTOMY;  Surgeon: Jayne Vonn DEL, MD;  Location: [facility] OR;  Service: Gynecology;  Laterality: N/A;  . HYSTEROSCOPY WITH D & C N/A 08/26/2014   Procedure: DILATATION AND CURETTAGE /HYSTEROSCOPY and cervical repair of cervical laceration ;  Surgeon: Nena App, MD;  Location: [facility];  Service: Gynecology;  Laterality: N/A;  . IR ANGIOGRAM PELVIS SELECTIVE OR SUPRASELECTIVE  10/08/2020  . IR ANGIOGRAM PELVIS SELECTIVE OR SUPRASELECTIVE  10/08/2020  . IR ANGIOGRAM SELECTIVE EACH ADDITIONAL VESSEL  10/08/2020  . IR EMBO TUMOR ORGAN ISCHEMIA INFARCT INC GUIDE ROADMAPPING  10/08/2020  . IR RADIOLOGIST EVAL & MGMT  07/10/2020  . IR RADIOLOGIST EVAL & MGMT  07/31/2020  . IR RADIOLOGIST EVAL & MGMT  09/09/2020  . IR RADIOLOGIST EVAL & MGMT  11/18/2020  . IR RADIOLOGIST EVAL & MGMT  09/14/2021  . IR RADIOLOGIST EVAL & MGMT  12/26/2023  . IR US  GUIDE VASC ACCESS RIGHT  10/08/2020  . POLYPECTOMY  06/13/2020   Procedure: POLYPECTOMY;  Surgeon: Rollin Dover, MD;  Location: [facility];  Service: Endoscopy;;  . UMBILICAL  HERNIA REPAIR    . WISDOM TOOTH EXTRACTION     Social History   Occupational History  . Not on file  Tobacco Use  . Smoking status: Never  . Smokeless tobacco: Never  Vaping Use  . Vaping status: Never Used  Substance and Sexual Activity  . Alcohol use: Not Currently    Alcohol/week: 1.0 standard drink of alcohol    Types: 1 Cans of beer per week    Comment: rare  . Drug use: No  . Sexual activity: Not Currently

## 2024-02-13 ENCOUNTER — Encounter: Payer: Self-pay | Admitting: Radiology

## 2024-02-13 ENCOUNTER — Other Ambulatory Visit (HOSPITAL_COMMUNITY): Payer: Self-pay

## 2024-02-13 MED ORDER — POTASSIUM CHLORIDE CRYS ER 20 MEQ PO TBCR: 20.0000 meq | EXTENDED_RELEASE_TABLET | Freq: Every day | ORAL | 3 refills | Status: AC

## 2024-02-13 MED ORDER — SPIRONOLACTONE 25 MG PO TABS: 25.0000 mg | ORAL_TABLET | Freq: Every day | ORAL | 3 refills | Status: AC

## 2024-02-14 ENCOUNTER — Ambulatory Visit: Admitting: Podiatry

## 2024-02-21 ENCOUNTER — Other Ambulatory Visit: Payer: Self-pay | Admitting: Internal Medicine

## 2024-02-21 ENCOUNTER — Encounter: Payer: Self-pay | Admitting: Internal Medicine

## 2024-02-21 MED ORDER — FERROUS SULFATE 325 (65 FE) MG PO TABS: 325.0000 mg | ORAL_TABLET | ORAL | 0 refills | Status: DC

## 2024-02-28 ENCOUNTER — Ambulatory Visit: Admitting: Podiatry

## 2024-02-28 ENCOUNTER — Encounter: Payer: Self-pay | Admitting: Podiatry

## 2024-02-28 DIAGNOSIS — D2371 Other benign neoplasm of skin of right lower limb, including hip: Secondary | ICD-10-CM

## 2024-02-28 DIAGNOSIS — M7989 Other specified soft tissue disorders: Secondary | ICD-10-CM

## 2024-02-28 DIAGNOSIS — D2372 Other benign neoplasm of skin of left lower limb, including hip: Secondary | ICD-10-CM | POA: Diagnosis not present

## 2024-02-28 NOTE — Progress Notes (Signed)
 She presents today concerned about swelling in her bilateral extremities.  She states that she has a history of congestive heart failure.  She is wondering if she can get some compression hose.  She also has painful lesions plantar aspect of the bilateral foot.  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  No open lesions or wounds.  Edema is currently down but she has not been to work today.  Assessment: Congestive heart failure with lower extremity edema bilateral.  Painful benign skin lesions bilateral.  Plan: Debrided benign skin lesions.  Wrote a prescription for 20 to 30 mg of mercury compression to thigh-high hose that she can purchase at local durable medical goods store.

## 2024-03-06 ENCOUNTER — Encounter: Payer: Self-pay | Admitting: Pulmonary Disease

## 2024-03-06 ENCOUNTER — Ambulatory Visit (INDEPENDENT_AMBULATORY_CARE_PROVIDER_SITE_OTHER): Admitting: Pulmonary Disease

## 2024-03-06 VITALS — BP 125/86 | HR 61 | Ht 65.5 in | Wt 246.0 lb

## 2024-03-06 DIAGNOSIS — J986 Disorders of diaphragm: Secondary | ICD-10-CM

## 2024-03-06 DIAGNOSIS — E669 Obesity, unspecified: Secondary | ICD-10-CM

## 2024-03-06 DIAGNOSIS — Z6841 Body Mass Index (BMI) 40.0 and over, adult: Secondary | ICD-10-CM | POA: Diagnosis not present

## 2024-03-06 DIAGNOSIS — R942 Abnormal results of pulmonary function studies: Secondary | ICD-10-CM

## 2024-03-06 DIAGNOSIS — J984 Other disorders of lung: Secondary | ICD-10-CM | POA: Diagnosis not present

## 2024-03-06 NOTE — Progress Notes (Signed)
 New Patient Pulmonology Office Visit   Subjective:  Patient ID: Amanda Paul, female    DOB: 24-Dec-1975  MRN: [id number]  Referred by: Lee, Jordan, NP  CC:  Chief Complaint  Patient presents with   Medical Management of Chronic Issues    Pt states post PFT     Discussed the use of AI scribe software for clinical note transcription with the patient, who gave verbal consent to proceed.  History of Present Illness Amanda Paul is a 48 year old female with hypertension, congestive heart failure, type two diabetes, and obesity who presents with abnormal pulmonary function tests.   Pulmonary function tests on October 1st, 2025, showed a normal FEV1/FVC ratio with reduced FEV1 at 55% and FVC at 52%. TLC was 59% and DLCO 53%, consistent with a restrictive pattern. She has no current shortness of breath and feels her breathing is normal. She underwent emergency surgery in August 2025 for acute abdomen and is s/p hysterectomy with salpingo-oophorectomy along with removal of several liters of necrotic appearing fluid. She was also treated for pneumonia during her admission. She had no breathing issues before that event.  She has since lost about 50 pounds and now weighs 246 pounds, with prior weight up to 379 pounds.  She denies chronic wheezing, cough, mucus production, or hemoptysis, with only a transient cough when acutely ill. She denies prior blood clots and symptoms of sleep apnea. She sleeps through the night and wakes rested. She is attempting dietary changes and increased activity.  She has an elevated diaphragm first noted on CT in 2014. Despite this, she has not had significant breathing difficulties.        Review of Systems  Constitutional:  Negative for chills, fever, malaise/fatigue and weight loss.  HENT:  Negative for congestion, sinus pain and sore throat.   Eyes: Negative.   Respiratory:  Negative for cough, hemoptysis, sputum production,  shortness of breath and wheezing.   Cardiovascular:  Negative for chest pain, palpitations, orthopnea, claudication and leg swelling.  Gastrointestinal:  Negative for abdominal pain, heartburn, nausea and vomiting.  Genitourinary: Negative.   Musculoskeletal:  Negative for joint pain and myalgias.  Skin:  Negative for rash.  Neurological:  Negative for weakness.  Endo/Heme/Allergies: Negative.   Psychiatric/Behavioral: Negative.      Allergies: Amoxicillin, Doxycycline  hyclate, and Penicillins  Current Outpatient Medications:    cetirizine (ZYRTEC) 10 MG tablet, Take 10 mg by mouth daily., Disp: , Rfl:    Cholecalciferol (VITAMIN D ) 50 MCG (2000 UT) CAPS, Take 2,000 Units by mouth at bedtime., Disp: , Rfl:    ferrous sulfate  325 (65 FE) MG tablet, Take 1 tablet (325 mg total) by mouth every other day., Disp: 30 tablet, Rfl: 0   ibuprofen  (ADVIL ) 600 MG tablet, Take 1 tablet (600 mg total) by mouth every 6 (six) hours as needed., Disp: 30 tablet, Rfl: 0   losartan  (COZAAR ) 25 MG tablet, Take 1 tablet (25 mg total) by mouth daily., Disp: 30 tablet, Rfl: 5   Multiple Vitamins-Minerals (MULTIVITAMIN WITH MINERALS) tablet, Take 1 tablet by mouth daily., Disp: , Rfl:    potassium chloride  SA (KLOR-CON  M) 20 MEQ tablet, Take 1 tablet (20 mEq total) by mouth daily., Disp: 90 tablet, Rfl: 3   rosuvastatin  (CRESTOR ) 20 MG tablet, Take 1 tablet (20 mg total) by mouth daily., Disp: 90 tablet, Rfl: 1   spironolactone  (ALDACTONE ) 25 MG tablet, Take 1 tablet (25 mg total) by mouth daily., Disp: 90 tablet,  Rfl: 3   torsemide  (DEMADEX ) 20 MG tablet, Take 1 tablet (20 mg total) by mouth daily., Disp: 90 tablet, Rfl: 3 Past Medical History:  Diagnosis Date   Allergy    Arthritis    Hernia, umbilical    Hyperlipidemia    Hypertension    Low grade squamous intraepithelial lesion (LGSIL) on cervicovaginal cytologic smear 06/10/2000   Morbid obesity (HCC)    Pain in right hand 01/19/2019   Prediabetes  03/21/2023   Umbilical hernia 04/14/2012   Uterine fibroids    Past Surgical History:  Procedure Laterality Date   ADVANCEMENT / RECONSTRUCTION POSTERIOR TIBIAL TENDON / [facility] Left 10/09/2021   COLONOSCOPY WITH PROPOFOL  N/A 06/13/2020   Procedure: COLONOSCOPY WITH PROPOFOL ;  Surgeon: Rollin Dover, MD;  Location: [facility];  Service: Endoscopy;  Laterality: N/A;   HEMOSTASIS CLIP PLACEMENT  06/13/2020   Procedure: HEMOSTASIS CLIP PLACEMENT;  Surgeon: Rollin Dover, MD;  Location: [facility];  Service: Endoscopy;;   HERNIA REPAIR     HYSTERECTOMY ABDOMINAL WITH SALPINGO-OOPHORECTOMY N/A 12/04/2023   Procedure: HYSTERECTOMY, ABDOMINAL, WITH SALPINGO-OOPHORECTOMY;  Surgeon: Jayne Vonn DEL, MD;  Location: [facility] OR;  Service: Gynecology;  Laterality: N/A;   HYSTEROSCOPY WITH D & C N/A 08/26/2014   Procedure: DILATATION AND CURETTAGE /HYSTEROSCOPY and cervical repair of cervical laceration ;  Surgeon: Nena App, MD;  Location: [facility];  Service: Gynecology;  Laterality: N/A;   IR ANGIOGRAM PELVIS SELECTIVE OR SUPRASELECTIVE  10/08/2020   IR ANGIOGRAM PELVIS SELECTIVE OR SUPRASELECTIVE  10/08/2020   IR ANGIOGRAM SELECTIVE EACH ADDITIONAL VESSEL  10/08/2020   IR EMBO TUMOR ORGAN ISCHEMIA INFARCT INC GUIDE ROADMAPPING  10/08/2020   IR RADIOLOGIST EVAL & MGMT  07/10/2020   IR RADIOLOGIST EVAL & MGMT  07/31/2020   IR RADIOLOGIST EVAL & MGMT  09/09/2020   IR RADIOLOGIST EVAL & MGMT  11/18/2020   IR RADIOLOGIST EVAL & MGMT  09/14/2021   IR RADIOLOGIST EVAL & MGMT  12/26/2023   IR US  GUIDE VASC ACCESS RIGHT  10/08/2020   POLYPECTOMY  06/13/2020   Procedure: POLYPECTOMY;  Surgeon: Rollin Dover, MD;  Location: [facility];  Service: Endoscopy;;   UMBILICAL HERNIA REPAIR     WISDOM TOOTH EXTRACTION     Family History  Problem Relation Age of Onset   Hypertension Mother    Colon polyps Neg Hx    Colon cancer Neg Hx    Esophageal cancer Neg Hx    Rectal cancer Neg Hx    Stomach cancer Neg  Hx    Social History   Socioeconomic History   Marital status: Single    Spouse name: Not on file   Number of children: Not on file   Years of education: Not on file   Highest education level: Some college, no degree  Occupational History   Not on file  Tobacco Use   Smoking status: Never   Smokeless tobacco: Never  Vaping Use   Vaping status: Never Used  Substance and Sexual Activity   Alcohol use: Not Currently    Alcohol/week: 1.0 standard drink of alcohol    Types: 1 Cans of beer per week    Comment: rare   Drug use: No   Sexual activity: Not Currently  Other Topics Concern   Not on file  Social History Narrative   Marital status: single      Children: none      Lives: alone      Employment:  [employer]  Social Drivers of Corporate Investment Banker Strain: Low Risk  (11/16/2023)   Overall Financial Resource Strain (CARDIA)    Difficulty of Paying Living Expenses: Not hard at all  Food Insecurity: No Food Insecurity (01/13/2024)   Hunger Vital Sign    Worried About Running Out of Food in the Last Year: Never true    Ran Out of Food in the Last Year: Never true  Transportation Needs: No Transportation Needs (01/13/2024)   PRAPARE - Administrator, Civil Service (Medical): No    Lack of Transportation (Non-Medical): No  Physical Activity: Insufficiently Active (11/16/2023)   Exercise Vital Sign    Days of Exercise per Week: 1 day    Minutes of Exercise per Session: 10 min  Stress: No Stress Concern Present (11/16/2023)   [employer] of Occupational Health - Occupational Stress Questionnaire    Feeling of Stress: Only a little  Social Connections: Moderately Isolated (11/16/2023)   Social Connection and Isolation Panel    Frequency of Communication with Friends and Family: More than three times a week    Frequency of Social Gatherings with Friends and Family: Three times a week    Attends Religious Services: 1 to 4 times per year     Active Member of Clubs or Organizations: No    Attends Banker Meetings: Not on file    Marital Status: Never married  Intimate Partner Violence: Unknown (12/12/2023)   Humiliation, Afraid, Rape, and Kick questionnaire    Fear of Current or Ex-Partner: Not on file    Emotionally Abused: No    Physically Abused: Not on file    Sexually Abused: Not on file       Objective:  BP 125/86   Pulse 61   Ht 5' 5.5 (1.664 m) Comment: per pt  Wt 246 lb (111.6 kg)   LMP 09/14/2023   SpO2 99%   BMI 40.31 kg/m    Physical Exam Constitutional:      General: She is not in acute distress.    Appearance: Normal appearance. She is obese.  Eyes:     General: No scleral icterus.    Conjunctiva/sclera: Conjunctivae normal.  Cardiovascular:     Rate and Rhythm: Normal rate and regular rhythm.  Pulmonary:     Breath sounds: No wheezing, rhonchi or rales.  Musculoskeletal:     Right lower leg: No edema.     Left lower leg: No edema.  Skin:    General: Skin is warm and dry.  Neurological:     General: No focal deficit present.    CT Abd/pelvis 12/26/23 Lower Chest: Decreased right lower lobe atelectasis since prior exam.  CXR 12/15/23 1. Similar right pleural effusion with overlying right lung base airspace opacity and/or atelectasis. 2. Left basilar atelectasis. 3. Right chest tube in stable position.  Diagnostic Review:  Last CBC Lab Results  Component Value Date   WBC 8.7 12/21/2023   HGB 10.1 (L) 12/21/2023   HCT 33.6 (L) 12/21/2023   MCV 83.2 12/21/2023   MCH 25.0 (L) 12/21/2023   RDW 19.9 (H) 12/21/2023   PLT 766 (H) 12/21/2023   Last metabolic panel Lab Results  Component Value Date   GLUCOSE 81 01/30/2024   NA 138 01/30/2024   K 3.2 (L) 01/30/2024   CL 98 01/30/2024   CO2 28 01/30/2024   BUN 16 01/30/2024   CREATININE 0.66 01/30/2024   GFRNONAA >60 01/30/2024   CALCIUM  9.3 01/30/2024  PHOS 2.9 12/17/2023   PROT 6.8 12/11/2023   ALBUMIN  2.0 (L)  12/17/2023   LABGLOB 4.2 09/20/2023   AGRATIO 1.3 09/15/2022   BILITOT 0.4 12/11/2023   ALKPHOS 53 12/11/2023   AST 16 12/11/2023   ALT 12 12/11/2023   ANIONGAP 12 01/30/2024       Assessment & Plan:   Assessment & Plan Elevated diaphragm     Restrictive ventilatory defect     Diffusion capacity of lung (dl), decreased      Assessment and Plan Assessment & Plan 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 PFTs show reduced FEV1, FVC, and TLC with normal FEV1/FVC ratio, indicating restrictive pattern. CT shows elevated right diaphragm, no lung scarring indicative of an ILD. Moderate diffusion defect possibly due to diaphragm elevation. Weight loss may improve lung function. - Continue weight loss efforts to improve lung function. - No repeat CT scan needed. - Will repeat PFTs in a year  Obesity Significant weight loss of 50 pounds since surgery, current weight 246 pounds. Achieved through dietary changes, no exercise regimen. Sustained weight loss. - Continue dietary modifications and weight management. - Encouraged further weight loss to improve lung function and health. -Sleep study pending per cardiology team      Return in 1 year (on 03/06/2025) for f/u visit Dr. Kara.   Dorn KATHEE Kara, MD

## 2024-03-06 NOTE — Patient Instructions (Addendum)
 I will reach out to your heart doctors about moving forward with the sleep study  Sleep apnea can contribute to pulmonary hypertension and decreased diffusion capacity  Your breathing tests are abnormal due to the elevated diaphragm   Continue to work on weight loss, which can likely help with the restrictive defect changes on your breathing tests  Follow up in 1 year, call sooner if needed

## 2024-03-06 NOTE — Assessment & Plan Note (Addendum)
 Amanda Paul

## 2024-03-12 ENCOUNTER — Encounter: Payer: Self-pay | Admitting: Pulmonary Disease

## 2024-03-21 ENCOUNTER — Ambulatory Visit: Payer: Self-pay | Admitting: Internal Medicine

## 2024-03-21 VITALS — BP 118/82 | HR 69 | Temp 98.3°F | Ht 65.0 in | Wt 250.0 lb

## 2024-03-21 DIAGNOSIS — I110 Hypertensive heart disease with heart failure: Secondary | ICD-10-CM | POA: Diagnosis not present

## 2024-03-21 DIAGNOSIS — I5032 Chronic diastolic (congestive) heart failure: Secondary | ICD-10-CM | POA: Diagnosis not present

## 2024-03-21 DIAGNOSIS — R7303 Prediabetes: Secondary | ICD-10-CM | POA: Diagnosis not present

## 2024-03-21 DIAGNOSIS — E7800 Pure hypercholesterolemia, unspecified: Secondary | ICD-10-CM

## 2024-03-21 DIAGNOSIS — I2510 Atherosclerotic heart disease of native coronary artery without angina pectoris: Secondary | ICD-10-CM | POA: Diagnosis not present

## 2024-03-21 LAB — CMP14+EGFR
ALT: 22 IU/L (ref 0–32)
AST: 18 IU/L (ref 0–40)
Albumin: 4.7 g/dL (ref 3.9–4.9)
Alkaline Phosphatase: 74 IU/L (ref 41–116)
BUN/Creatinine Ratio: 30 — ABNORMAL HIGH (ref 9–23)
BUN: 23 mg/dL (ref 6–24)
Bilirubin Total: 0.2 mg/dL (ref 0.0–1.2)
CO2: 25 mmol/L (ref 20–29)
Calcium: 10.3 mg/dL — ABNORMAL HIGH (ref 8.7–10.2)
Chloride: 96 mmol/L (ref 96–106)
Creatinine, Ser: 0.76 mg/dL (ref 0.57–1.00)
Globulin, Total: 3.6 g/dL (ref 1.5–4.5)
Glucose: 86 mg/dL (ref 70–99)
Potassium: 4 mmol/L (ref 3.5–5.2)
Sodium: 138 mmol/L (ref 134–144)
Total Protein: 8.3 g/dL (ref 6.0–8.5)
eGFR: 95 mL/min/1.73 (ref >59)

## 2024-03-21 LAB — LIPID PANEL
Chol/HDL Ratio: 2.1 ratio (ref 0.0–4.4)
Cholesterol, Total: 202 mg/dL — ABNORMAL HIGH (ref 100–199)
HDL: 96 mg/dL (ref >39)
LDL Chol Calc (NIH): 97 mg/dL (ref 0–99)
Triglycerides: 49 mg/dL (ref 0–149)
VLDL Cholesterol Cal: 9 mg/dL (ref 5–40)

## 2024-03-21 LAB — HEMOGLOBIN A1C
Est. average glucose Bld gHb Est-mCnc: 128 mg/dL
Hgb A1c MFr Bld: 6.1 % — ABNORMAL HIGH (ref 4.8–5.6)

## 2024-03-21 NOTE — Progress Notes (Unsigned)
 I,Victoria T Emmitt, CMA,acting as a neurosurgeon for Catheryn LOISE Slocumb, MD.,have documented all relevant documentation on the behalf of Catheryn LOISE Slocumb, MD,as directed by  Catheryn LOISE Slocumb, MD while in the presence of Catheryn LOISE Slocumb, MD.  Subjective:  Patient ID: Amanda Paul , female    DOB: 06-08-1975 , 48 y.o.   MRN: [id number]  Chief Complaint  Patient presents with   Hypertension    Patient presents today for bp & cholesterol follow up. She reports compliance with medications. Denies headache, chest pain & sob.  She will call to schedule DM eye exam.    Hyperlipidemia    HPI Discussed the use of AI scribe software for clinical note transcription with the patient, who gave verbal consent to proceed.  History of Present Illness Amanda Paul is a 48 year old female with hypertension who presents for a blood pressure check.  She feels well and has returned to work, initially part-time, and gradually increased to full-time. Her energy levels are good. She is currently taking losartan  25 mg for blood pressure and potassium supplements due to low levels, which were initially one per day but increased to two per day after lab results showed low potassium. She is also on furosemide , which may contribute to her potassium issues.  She has a history of significant weight loss during a previous hospitalization but notes that her weight is starting to increase again, which she attributes to Thanksgiving. She is glad to have her appetite back. Her last hemoglobin A1c was 6.9, and she is interested in rechecking it. She has a family history of diabetes, with one aunt and an uncle having the condition, but no immediate family members or grandparents with diabetes.  She has not yet completed a sleep study. She recently received a pneumonia vaccine, which she believes helped mitigate the severity of a recent pneumonia diagnosis. She has also received her flu and COVID vaccines.  She is  currently taking rosuvastatin  20 mg and has not had a dose change. She has not eaten today. No longer having loose stools, but notes increased urination due to torasemide. No new gynecological issues and plans to see her gynecologist next year for her annual check-up.   Hypertension This is a chronic problem. The current episode started more than 1 year ago. The problem has been gradually improving since onset. The problem is controlled. Pertinent negatives include no blurred vision or palpitations. Risk factors for coronary artery disease include dyslipidemia and obesity. Past treatments include calcium  channel blockers.     Past Medical History:  Diagnosis Date   Allergy    Arthritis    Hernia, umbilical    Hyperlipidemia    Hypertension    Low grade squamous intraepithelial lesion (LGSIL) on cervicovaginal cytologic smear 06/10/2000   Morbid obesity (HCC)    Pain in right hand 01/19/2019   Prediabetes 03/21/2023   Umbilical hernia 04/14/2012   Uterine fibroids      Family History  Problem Relation Age of Onset   Hypertension Mother    Colon polyps Neg Hx    Colon cancer Neg Hx    Esophageal cancer Neg Hx    Rectal cancer Neg Hx    Stomach cancer Neg Hx      Current Outpatient Medications:    cetirizine (ZYRTEC) 10 MG tablet, Take 10 mg by mouth daily., Disp: , Rfl:    Cholecalciferol (VITAMIN D ) 50 MCG (2000 UT) CAPS, Take 2,000 Units by mouth at bedtime.,  Disp: , Rfl:    ferrous sulfate  325 (65 FE) MG tablet, Take 1 tablet (325 mg total) by mouth every other day., Disp: 30 tablet, Rfl: 0   ibuprofen  (ADVIL ) 600 MG tablet, Take 1 tablet (600 mg total) by mouth every 6 (six) hours as needed., Disp: 30 tablet, Rfl: 0   losartan  (COZAAR ) 25 MG tablet, Take 1 tablet (25 mg total) by mouth daily., Disp: 30 tablet, Rfl: 5   Multiple Vitamins-Minerals (MULTIVITAMIN WITH MINERALS) tablet, Take 1 tablet by mouth daily., Disp: , Rfl:    potassium chloride  SA (KLOR-CON  M) 20 MEQ  tablet, Take 1 tablet (20 mEq total) by mouth daily., Disp: 90 tablet, Rfl: 3   rosuvastatin  (CRESTOR ) 20 MG tablet, Take 1 tablet (20 mg total) by mouth daily., Disp: 90 tablet, Rfl: 1   spironolactone  (ALDACTONE ) 25 MG tablet, Take 1 tablet (25 mg total) by mouth daily., Disp: 90 tablet, Rfl: 3   torsemide  (DEMADEX ) 20 MG tablet, Take 1 tablet (20 mg total) by mouth daily., Disp: 90 tablet, Rfl: 3   Allergies  Allergen Reactions   Amoxicillin Hives, Itching and Swelling    Swollen eyes and throat.   Doxycycline  Hyclate Swelling    Bilateral lower leg swelling   Penicillins Hives and Swelling     Review of Systems  Constitutional: Negative.   Eyes:  Negative for blurred vision.  Respiratory: Negative.    Cardiovascular: Negative.  Negative for palpitations.  Gastrointestinal: Negative.   Neurological: Negative.   Psychiatric/Behavioral: Negative.       Today's Vitals   03/21/24 0920  BP: 118/82  Pulse: 69  Temp: 98.3 F (36.8 C)  SpO2: 98%  Weight: 250 lb (113.4 kg)  Height: 5' 5 (1.651 m)   Body mass index is 41.6 kg/m.  Wt Readings from Last 3 Encounters:  03/21/24 250 lb (113.4 kg)  03/06/24 246 lb (111.6 kg)  02/07/24 247 lb (112 kg)    The 10-year ASCVD risk score (Arnett DK, et al., 2019) is: 3.7%   Values used to calculate the score:     Age: 65 years     Clincally relevant sex: Female     Is Non-Hispanic African American: Yes     Diabetic: Yes     Tobacco smoker: No     Systolic Blood Pressure: 118 mmHg     Is BP treated: Yes     HDL Cholesterol: 70 mg/dL     Total Cholesterol: 175 mg/dL  Objective:  Physical Exam Vitals and nursing note reviewed.  Constitutional:      Appearance: Normal appearance. She is obese.  HENT:     Head: Normocephalic and atraumatic.  Eyes:     Extraocular Movements: Extraocular movements intact.  Cardiovascular:     Rate and Rhythm: Normal rate and regular rhythm.     Heart sounds: Normal heart sounds.  Pulmonary:      Effort: Pulmonary effort is normal.     Breath sounds: Normal breath sounds.  Musculoskeletal:     Cervical back: Normal range of motion.  Skin:    General: Skin is warm.  Neurological:     General: No focal deficit present.     Mental Status: She is alert.  Psychiatric:        Mood and Affect: Mood normal.        Behavior: Behavior normal.         Assessment And Plan:   Assessment & Plan Hypertensive heart disease with acute  on chronic diastolic congestive heart failure (HCC)  Coronary artery calcification  Pure hypercholesterolemia  Morbid obesity (HCC)  Prediabetes   Assessment & Plan Hypertensive heart disease with chronic diastolic heart failure Blood pressure controlled at 118/82 mmHg. Recent heart failure episode possibly triggered by pneumonia. Pulmonary hypertension and heart failure may have been compounded by respiratory illness. - Continue losartan  25 mg daily. - Continue furosemide  as prescribed. - Continue torasemide as prescribed.  Prediabetes Last A1c 6.9%, indicating diabetes range. Discussed treatment options including Farxiga, Jardiance, and Ozempic for heart and kidney protection, and weight loss. Decision on treatment pending A1c recheck. Consideration of early treatment due to cardiac risk. - Ordered A1c test. - Ordered kidney function tests including potassium levels.  Pure hypercholesterolemia Currently on rosuvastatin  20 mg. Plan to recheck lipid levels as it has been over six months since last test.  Morbid obesity Weight gain since last hospitalization. Discussed potential benefits of diabetes medications like Ozempic for weight loss. - Discussed potential treatment options for prediabetes that may aid in weight loss.  General health maintenance Received pneumonia, flu, and COVID vaccines recently. - Continue routine health maintenance and vaccinations as needed.   No orders of the defined types were placed in this  encounter.    Return for 6 month bp & chol f/u.SABRA  Patient was given opportunity to ask questions. Patient verbalized understanding of the plan and was able to repeat key elements of the plan. All questions were answered to their satisfaction.    I, Catheryn LOISE Slocumb, MD, have reviewed all documentation for this visit. The documentation on 03/21/24 for the exam, diagnosis, procedures, and orders are all accurate and complete.   IF YOU HAVE BEEN REFERRED TO A SPECIALIST, IT MAY TAKE 1-2 WEEKS TO SCHEDULE/PROCESS THE REFERRAL. IF YOU HAVE NOT HEARD FROM US /SPECIALIST IN TWO WEEKS, PLEASE GIVE US  A CALL AT [phone].

## 2024-03-21 NOTE — Patient Instructions (Signed)
 Hypertension, Adult Hypertension is another name for high blood pressure. High blood pressure forces your heart to work harder to pump blood. This can cause problems over time. There are two numbers in a blood pressure reading. There is a top number (systolic) over a bottom number (diastolic). It is best to have a blood pressure that is below 120/80. What are the causes? The cause of this condition is not known. Some other conditions can lead to high blood pressure. What increases the risk? Some lifestyle factors can make you more likely to develop high blood pressure: Smoking. Not getting enough exercise or physical activity. Being overweight. Having too much fat, sugar, calories, or salt (sodium) in your diet. Drinking too much alcohol. Other risk factors include: Having any of these conditions: Heart disease. Diabetes. High cholesterol. Kidney disease. Obstructive sleep apnea. Having a family history of high blood pressure and high cholesterol. Age. The risk increases with age. Stress. What are the signs or symptoms? High blood pressure may not cause symptoms. Very high blood pressure (hypertensive crisis) may cause: Headache. Fast or uneven heartbeats (palpitations). Shortness of breath. Nosebleed. Vomiting or feeling like you may vomit (nauseous). Changes in how you see. Very bad chest pain. Feeling dizzy. Seizures. How is this treated? This condition is treated by making healthy lifestyle changes, such as: Eating healthy foods. Exercising more. Drinking less alcohol. Your doctor may prescribe medicine if lifestyle changes do not help enough and if: Your top number is above 130. Your bottom number is above 80. Your personal target blood pressure may vary. Follow these instructions at home: Eating and drinking  If told, follow the DASH eating plan. To follow this plan: Fill one half of your plate at each meal with fruits and vegetables. Fill one fourth of your plate  at each meal with whole grains. Whole grains include whole-wheat pasta, brown rice, and whole-grain bread. Eat or drink low-fat dairy products, such as skim milk or low-fat yogurt. Fill one fourth of your plate at each meal with low-fat (lean) proteins. Low-fat proteins include fish, chicken without skin, eggs, beans, and tofu. Avoid fatty meat, cured and processed meat, or chicken with skin. Avoid pre-made or processed food. Limit the amount of salt in your diet to less than 1,500 mg each day. Do not drink alcohol if: Your doctor tells you not to drink. You are pregnant, may be pregnant, or are planning to become pregnant. If you drink alcohol: Limit how much you have to: 0-1 drink a day for women. 0-2 drinks a day for men. Know how much alcohol is in your drink. In the [place] one drink equals one 12 oz bottle of beer (355 mL), one 5 oz glass of wine (148 mL), or one 1 oz glass of hard liquor (44 mL). Lifestyle  Work with your doctor to stay at a healthy weight or to lose weight. Ask your doctor what the best weight is for you. Get at least 30 minutes of exercise that causes your heart to beat faster (aerobic exercise) most days of the week. This may include walking, swimming, or biking. Get at least 30 minutes of exercise that strengthens your muscles (resistance exercise) at least 3 days a week. This may include lifting weights or doing Pilates. Do not smoke or use any products that contain nicotine or tobacco. If you need help quitting, ask your doctor. Check your blood pressure at home as told by your doctor. Keep all follow-up visits. Medicines Take over-the-counter and prescription medicines  only as told by your doctor. Follow directions carefully. Do not skip doses of blood pressure medicine. The medicine does not work as well if you skip doses. Skipping doses also puts you at risk for problems. Ask your doctor about side effects or reactions to medicines that you should watch  for. Contact a doctor if: You think you are having a reaction to the medicine you are taking. You have headaches that keep coming back. You feel dizzy. You have swelling in your ankles. You have trouble with your vision. Get help right away if: You get a very bad headache. You start to feel mixed up (confused). You feel weak or numb. You feel faint. You have very bad pain in your: Chest. Belly (abdomen). You vomit more than once. You have trouble breathing. These symptoms may be an emergency. Get help right away. Call 911. Do not wait to see if the symptoms will go away. Do not drive yourself to the hospital. Summary Hypertension is another name for high blood pressure. High blood pressure forces your heart to work harder to pump blood. For most people, a normal blood pressure is less than 120/80. Making healthy choices can help lower blood pressure. If your blood pressure does not get lower with healthy choices, you may need to take medicine. This information is not intended to replace advice given to you by your health care provider. Make sure you discuss any questions you have with your health care provider. Document Revised: 01/15/2021 Document Reviewed: 01/15/2021 Elsevier Patient Education  2024 [employer].

## 2024-03-22 ENCOUNTER — Ambulatory Visit: Admitting: Internal Medicine

## 2024-03-26 ENCOUNTER — Ambulatory Visit: Payer: Self-pay | Admitting: Internal Medicine

## 2024-03-26 ENCOUNTER — Telehealth (HOSPITAL_COMMUNITY): Payer: Self-pay

## 2024-03-26 NOTE — Telephone Encounter (Signed)
 Called patient and let her know that no pre cert is needed for itamar sleep study. She can proceed with this. Patient aware and verbalized understanding.

## 2024-03-29 ENCOUNTER — Encounter: Payer: Self-pay | Admitting: Cardiology

## 2024-03-29 DIAGNOSIS — G4733 Obstructive sleep apnea (adult) (pediatric): Secondary | ICD-10-CM

## 2024-03-31 NOTE — Assessment & Plan Note (Signed)
 Currently on rosuvastatin  20 mg. Plan to recheck lipid levels as it has been over six months since last test.

## 2024-03-31 NOTE — Assessment & Plan Note (Signed)
 BMI 41 with underlying HTN. She is encouraged to strive to lose ten percent of her body weight to decrease cardiac risk.

## 2024-03-31 NOTE — Assessment & Plan Note (Signed)
 Calcium  score of 54 and elevated LP(a) suggest coronary artery disease. Trivial mitral valve regurgitation and regional wall motion abnormalities noted on echo. - She will continue with rosuvastatin  - Follow heart healthy lifestyle.

## 2024-03-31 NOTE — Assessment & Plan Note (Signed)
 Blood pressure controlled at 118/82 mmHg. Recent heart failure episode possibly triggered by pneumonia. Pulmonary hypertension and heart failure may have been compounded by respiratory illness. - Continue losartan  25 mg daily. - Continue furosemide  as prescribed. - Continue torasemide as prescribed. - Follow low sodium diet.

## 2024-04-03 ENCOUNTER — Other Ambulatory Visit: Payer: Self-pay | Admitting: Podiatry

## 2024-04-03 ENCOUNTER — Other Ambulatory Visit: Payer: Self-pay | Admitting: Internal Medicine

## 2024-04-03 ENCOUNTER — Encounter: Payer: Self-pay | Admitting: Podiatry

## 2024-04-03 DIAGNOSIS — Z1231 Encounter for screening mammogram for malignant neoplasm of breast: Secondary | ICD-10-CM

## 2024-04-03 MED ORDER — IBUPROFEN 800 MG PO TABS: 800.0000 mg | ORAL_TABLET | Freq: Three times a day (TID) | ORAL | 0 refills | Status: AC

## 2024-04-03 MED ORDER — IBUPROFEN 800 MG PO TABS: 800.0000 mg | ORAL_TABLET | Freq: Three times a day (TID) | ORAL | 0 refills | Status: AC | PRN

## 2024-04-07 NOTE — Procedures (Signed)
" ° ° ° °  SLEEP STUDY REPORT Patient Information Study Date: 03/29/2024 Patient Name: Amanda Paul Patient ID: [id number] Birth Date: Sep 24, 1975 Age: 48 Gender: Female BMI: 40.0 (W=240 lb, H=5' 5'') Stopbang: 5 Referring Physician: Jordan Lee, NP  TEST DESCRIPTION: Home sleep apnea testing was completed using the WatchPat, a Type 1 device, utilizing peripheral arterial tonometry (PAT), chest movement, actigraphy, pulse oximetry, pulse rate, body position and snore. AHI was calculated with apnea and hypopnea using valid sleep time as the denominator. RDI includes apneas, hypopneas, and RERAs. The data acquired and the scoring of sleep and all associated events were performed in accordance with the recommended standards and specifications as outlined in the AASM Manual for the Scoring of Sleep and Associated Events 2.2.0 (2015).  FINDINGS:  1. Mild Obstructive Sleep Apnea with AHI 11.2/hr. overall and moderate during REM sleep with REM AHI 24.6/hr  2. No Central Sleep Apnea with pAHIc 0.3/hr.  3. Oxygen desaturations as low as 83%.  4. Mild to moderate snoring was present. O2 sats were < 88% for 4.7 min.  5. Total sleep time was 6 hrs and 21 min.  6. 33.1% of total sleep time was spent in REM sleep.  7. Normal sleep onset latency at 20 min.  8. Shortened REM sleep onset latency at 69 min.  9. Total awakenings were 70. 10. Arrhythmia detection: None  DIAGNOSIS: Mild Obstructive Sleep Apnea (G47.33) Moderate Obstructive Sleep Apnea during REM sleep  RECOMMENDATIONS: 1. Clinical correlation of these findings is necessary. The decision to treat obstructive sleep apnea (OSA) is usually based on the presence of apnea symptoms or the presence of associated medical conditions such as Hypertension, Congestive Heart Failure, Atrial Fibrillation or Obesity. The most common symptoms of OSA are snoring, gasping for breath while sleeping, daytime sleepiness and fatigue. 2. Initiating apnea  therapy is recommended given the presence of symptoms and/or associated conditions. Recommend proceeding with one of the following:  a. Auto-CPAP therapy with a pressure range of 5-20cm H2O.  b. An oral appliance (OA) that can be obtained from certain dentists with expertise in sleep medicine. These are primarily of use in non-obese patients with mild and moderate disease.  c. An ENT consultation which may be useful to look for specific causes of obstruction and possible treatment options.  d. If patient is intolerant to PAP therapy, consider referral to ENT for evaluation for hypoglossal nerve stimulator. 3. Close follow-up is necessary to ensure success with CPAP or oral appliance therapy for maximum benefit . 4. A follow-up oximetry study on CPAP is recommended to assess the adequacy of therapy and determine the need for supplemental oxygen or the potential need for Bi-level therapy. An arterial blood gas to determine the adequacy of baseline ventilation and oxygenation should also be considered. 5. Healthy sleep recommendations include: adequate nightly sleep (normal 7-9 hrs/night), avoidance of caffeine after noon and alcohol near bedtime, and maintaining a sleep environment that is cool, dark and quiet. 6. Weight loss for overweight patients is recommended. Even modest amounts of weight loss can significantly improve the severity of sleep apnea. 7. Snoring recommendations include: weight loss where appropriate, side sleeping, and avoidance of alcohol before bed. 8. Operation of motor vehicle should be avoided when sleepy.  Signature: Wilbert Bihari, MD; [facility]; Diplomat, American Board of Sleep Medicine Electronically Signed: 04/07/2024 7:01:50 AM "

## 2024-04-09 ENCOUNTER — Telehealth: Payer: Self-pay | Admitting: *Deleted

## 2024-04-09 DIAGNOSIS — I5032 Chronic diastolic (congestive) heart failure: Secondary | ICD-10-CM

## 2024-04-09 DIAGNOSIS — G4733 Obstructive sleep apnea (adult) (pediatric): Secondary | ICD-10-CM

## 2024-04-09 DIAGNOSIS — I10 Essential (primary) hypertension: Secondary | ICD-10-CM

## 2024-04-09 DIAGNOSIS — I272 Pulmonary hypertension, unspecified: Secondary | ICD-10-CM

## 2024-04-09 NOTE — Telephone Encounter (Signed)
 The patient has been notified of the result and verbalized understanding.  All questions (if any) were answered. Joshua Dalton Seip, CMA 04/09/2024 10:03 AM    Patient declines the cpap and would like the mouth guard. She will call her insurance to see who they refer their clients too.

## 2024-04-09 NOTE — Telephone Encounter (Signed)
-----   Message from Wilbert Bihari, MD sent at 04/07/2024  7:04 AM EST ----- Please let patient know that they have sleep apnea and recommend treating with CPAP.  Please order an auto CPAP from 4-15cm H2O with heated humidity and mask of choice.  Order overnight pulse ox on CPAP.  Followup with me in 6 weeks.

## 2024-04-10 NOTE — Telephone Encounter (Signed)
 Referral for mouth guard placed to [facility]. Sleep study faxed to [phone].

## 2024-04-11 NOTE — Telephone Encounter (Signed)
 Patient is following up. She would like to clarify what she needs to tell dentist. She thinks an oxygen text may be needed and just wanted to be sure.

## 2024-04-13 ENCOUNTER — Ambulatory Visit
Admission: RE | Admit: 2024-04-13 | Discharge: 2024-04-13 | Disposition: A | Discharge status: Home / Self Care | Source: Ambulatory Visit | Attending: Internal Medicine | Admitting: Internal Medicine

## 2024-04-13 DIAGNOSIS — Z1231 Encounter for screening mammogram for malignant neoplasm of breast: Secondary | ICD-10-CM

## 2024-04-19 ENCOUNTER — Other Ambulatory Visit: Payer: Self-pay | Admitting: Internal Medicine

## 2024-04-23 NOTE — Telephone Encounter (Signed)
 Patient called in to say her dentist does not do the ONO test ordered by the doctor. I will consult with the doctor to see if she still wants her to have it since the patient has decided not to have the cpap machine but the mouth guard.

## 2024-04-25 ENCOUNTER — Ambulatory Visit

## 2024-04-26 NOTE — Telephone Encounter (Signed)
 Patient understands not to be concerned about the ONO and that once she has the oral appliance she will need to do another itamar  HST together with the oral appliance.

## 2024-04-27 ENCOUNTER — Ambulatory Visit: Admitting: Cardiology

## 2024-05-02 ENCOUNTER — Other Ambulatory Visit: Payer: Self-pay | Admitting: Internal Medicine

## 2024-06-19 ENCOUNTER — Ambulatory Visit: Admitting: Internal Medicine

## 2024-06-26 ENCOUNTER — Ambulatory Visit: Admitting: Podiatry

## 2024-09-26 ENCOUNTER — Encounter: Payer: Self-pay | Admitting: Internal Medicine
